# Patient Record
Sex: Female | Born: 1940
Health system: Southern US, Community
[De-identification: ages and names within clinical notes are randomized; demographics above are authoritative.]

## PROBLEM LIST (undated history)

## (undated) DIAGNOSIS — S82842A Displaced bimalleolar fracture of left lower leg, initial encounter for closed fracture: Secondary | ICD-10-CM

## (undated) DIAGNOSIS — C801 Malignant (primary) neoplasm, unspecified: Secondary | ICD-10-CM

## (undated) DIAGNOSIS — K222 Esophageal obstruction: Secondary | ICD-10-CM

## (undated) DIAGNOSIS — H409 Unspecified glaucoma: Secondary | ICD-10-CM

## (undated) DIAGNOSIS — Z8739 Personal history of other diseases of the musculoskeletal system and connective tissue: Secondary | ICD-10-CM

## (undated) DIAGNOSIS — D649 Anemia, unspecified: Secondary | ICD-10-CM

## (undated) DIAGNOSIS — M1990 Unspecified osteoarthritis, unspecified site: Secondary | ICD-10-CM

## (undated) DIAGNOSIS — E079 Disorder of thyroid, unspecified: Secondary | ICD-10-CM

## (undated) DIAGNOSIS — F32A Depression, unspecified: Secondary | ICD-10-CM

## (undated) DIAGNOSIS — F419 Anxiety disorder, unspecified: Secondary | ICD-10-CM

## (undated) DIAGNOSIS — K219 Gastro-esophageal reflux disease without esophagitis: Secondary | ICD-10-CM

## (undated) DIAGNOSIS — H269 Unspecified cataract: Secondary | ICD-10-CM

## (undated) DIAGNOSIS — C55 Malignant neoplasm of uterus, part unspecified: Secondary | ICD-10-CM

## (undated) DIAGNOSIS — Z8542 Personal history of malignant neoplasm of other parts of uterus: Secondary | ICD-10-CM

## (undated) DIAGNOSIS — E785 Hyperlipidemia, unspecified: Secondary | ICD-10-CM

## (undated) DIAGNOSIS — F329 Major depressive disorder, single episode, unspecified: Secondary | ICD-10-CM

## (undated) HISTORY — DX: Unspecified glaucoma: H40.9

## (undated) HISTORY — DX: Anemia, unspecified: D64.9

## (undated) HISTORY — DX: Unspecified cataract: H26.9

## (undated) HISTORY — DX: Malignant (primary) neoplasm, unspecified: C80.1

## (undated) HISTORY — DX: Esophageal obstruction: K22.2

## (undated) HISTORY — DX: Hyperlipidemia, unspecified: E78.5

## (undated) HISTORY — DX: Personal history of other diseases of the musculoskeletal system and connective tissue: Z87.39

## (undated) HISTORY — DX: Gastro-esophageal reflux disease without esophagitis: K21.9

## (undated) HISTORY — DX: Depression, unspecified: F32.A

## (undated) HISTORY — DX: Major depressive disorder, single episode, unspecified: F32.9

## (undated) HISTORY — DX: Disorder of thyroid, unspecified: E07.9

## (undated) HISTORY — DX: Personal history of malignant neoplasm of other parts of uterus: Z85.42

## (undated) HISTORY — DX: Malignant neoplasm of uterus, part unspecified: C55

## (undated) HISTORY — DX: Unspecified osteoarthritis, unspecified site: M19.90

## (undated) HISTORY — DX: Anxiety disorder, unspecified: F41.9

---

## 1997-04-15 DIAGNOSIS — Z8542 Personal history of malignant neoplasm of other parts of uterus: Secondary | ICD-10-CM

## 1997-04-15 HISTORY — DX: Personal history of malignant neoplasm of other parts of uterus: Z85.42

## 1997-04-15 HISTORY — PX: TOTAL ABDOMINAL HYSTERECTOMY: SHX209

## 1998-01-30 ENCOUNTER — Other Ambulatory Visit: Admission: RE | Admit: 1998-01-30 | Discharge: 1998-01-30 | Payer: Self-pay | Admitting: *Deleted

## 1998-02-27 ENCOUNTER — Other Ambulatory Visit: Admission: RE | Admit: 1998-02-27 | Discharge: 1998-02-27 | Payer: Self-pay | Admitting: *Deleted

## 1998-03-13 ENCOUNTER — Encounter: Admission: RE | Admit: 1998-03-13 | Discharge: 1998-06-11 | Payer: Self-pay | Admitting: Radiation Oncology

## 1999-01-26 ENCOUNTER — Other Ambulatory Visit: Admission: RE | Admit: 1999-01-26 | Discharge: 1999-01-26 | Payer: Self-pay | Admitting: *Deleted

## 1999-09-03 ENCOUNTER — Encounter: Payer: Self-pay | Admitting: Family Medicine

## 1999-09-03 ENCOUNTER — Encounter: Admission: RE | Admit: 1999-09-03 | Discharge: 1999-09-03 | Payer: Self-pay | Admitting: Family Medicine

## 1999-09-07 ENCOUNTER — Ambulatory Visit (HOSPITAL_COMMUNITY): Admission: RE | Admit: 1999-09-07 | Discharge: 1999-09-07 | Payer: Self-pay | Admitting: Ophthalmology

## 2000-06-24 ENCOUNTER — Encounter: Payer: Self-pay | Admitting: Oncology

## 2000-06-24 ENCOUNTER — Encounter: Admission: RE | Admit: 2000-06-24 | Discharge: 2000-06-24 | Payer: Self-pay | Admitting: Oncology

## 2000-10-22 ENCOUNTER — Other Ambulatory Visit: Admission: RE | Admit: 2000-10-22 | Discharge: 2000-10-22 | Payer: Self-pay | Admitting: Family Medicine

## 2000-11-19 ENCOUNTER — Encounter: Admission: RE | Admit: 2000-11-19 | Discharge: 2000-11-19 | Payer: Self-pay | Admitting: Family Medicine

## 2000-11-19 ENCOUNTER — Encounter: Payer: Self-pay | Admitting: Family Medicine

## 2001-07-20 ENCOUNTER — Encounter: Admission: RE | Admit: 2001-07-20 | Discharge: 2001-07-20 | Payer: Self-pay | Admitting: Oncology

## 2001-07-20 ENCOUNTER — Encounter: Payer: Self-pay | Admitting: Oncology

## 2002-07-27 ENCOUNTER — Encounter: Admission: RE | Admit: 2002-07-27 | Discharge: 2002-07-27 | Payer: Self-pay | Admitting: Oncology

## 2002-07-27 ENCOUNTER — Encounter: Payer: Self-pay | Admitting: Oncology

## 2003-05-17 HISTORY — PX: COLONOSCOPY: SHX174

## 2003-12-12 ENCOUNTER — Encounter: Admission: RE | Admit: 2003-12-12 | Discharge: 2003-12-12 | Payer: Self-pay | Admitting: Family Medicine

## 2005-02-28 ENCOUNTER — Encounter: Admission: RE | Admit: 2005-02-28 | Discharge: 2005-02-28 | Payer: Self-pay | Admitting: Family Medicine

## 2005-05-10 ENCOUNTER — Ambulatory Visit: Payer: Self-pay | Admitting: Family Medicine

## 2005-05-21 ENCOUNTER — Ambulatory Visit: Payer: Self-pay | Admitting: Family Medicine

## 2005-05-21 ENCOUNTER — Other Ambulatory Visit: Admission: RE | Admit: 2005-05-21 | Discharge: 2005-05-21 | Payer: Self-pay | Admitting: Family Medicine

## 2005-05-21 ENCOUNTER — Encounter: Payer: Self-pay | Admitting: Family Medicine

## 2005-05-24 ENCOUNTER — Encounter: Payer: Self-pay | Admitting: Family Medicine

## 2006-05-23 ENCOUNTER — Ambulatory Visit: Payer: Self-pay | Admitting: Family Medicine

## 2006-05-23 LAB — CONVERTED CEMR LAB
ALT: 18 units/L (ref 0–40)
AST: 24 units/L (ref 0–37)
Albumin: 3.5 g/dL (ref 3.5–5.2)
Alkaline Phosphatase: 59 units/L (ref 39–117)
Cholesterol: 230 mg/dL (ref 0–200)
Direct LDL: 136.7 mg/dL
Eosinophils Absolute: 0.1 10*3/uL (ref 0.0–0.6)
Eosinophils Relative: 2.4 % (ref 0.0–5.0)
GFR calc Af Amer: 93 mL/min
GFR calc non Af Amer: 77 mL/min
Glucose, Bld: 108 mg/dL — ABNORMAL HIGH (ref 70–99)
HCT: 36.9 % (ref 36.0–46.0)
HDL: 74.8 mg/dL (ref >39.0)
Hemoglobin: 12.9 g/dL (ref 12.0–15.0)
Hgb A1c MFr Bld: 5.8 % (ref 4.6–6.0)
Lymphocytes Relative: 27.2 % (ref 12.0–46.0)
Monocytes Relative: 11.9 % — ABNORMAL HIGH (ref 3.0–11.0)
Neutrophils Relative %: 58.2 % (ref 43.0–77.0)
Platelets: 257 10*3/uL (ref 150–400)
Potassium: 5.1 meq/L (ref 3.5–5.1)
Total Bilirubin: 0.7 mg/dL (ref 0.3–1.2)
Total CHOL/HDL Ratio: 3.1
VLDL: 27 mg/dL (ref 0–40)

## 2006-06-17 ENCOUNTER — Ambulatory Visit (HOSPITAL_COMMUNITY): Admission: RE | Admit: 2006-06-17 | Discharge: 2006-06-17 | Payer: Self-pay | Admitting: Ophthalmology

## 2006-06-24 ENCOUNTER — Ambulatory Visit: Payer: Self-pay | Admitting: Family Medicine

## 2006-09-02 ENCOUNTER — Encounter: Admission: RE | Admit: 2006-09-02 | Discharge: 2006-09-02 | Payer: Self-pay | Admitting: Family Medicine

## 2007-06-03 ENCOUNTER — Ambulatory Visit: Payer: Self-pay | Admitting: Family Medicine

## 2007-06-03 DIAGNOSIS — K222 Esophageal obstruction: Secondary | ICD-10-CM

## 2007-06-03 DIAGNOSIS — E785 Hyperlipidemia, unspecified: Secondary | ICD-10-CM

## 2007-06-03 DIAGNOSIS — K219 Gastro-esophageal reflux disease without esophagitis: Secondary | ICD-10-CM

## 2007-06-03 DIAGNOSIS — F332 Major depressive disorder, recurrent severe without psychotic features: Secondary | ICD-10-CM

## 2007-06-03 HISTORY — DX: Gastro-esophageal reflux disease without esophagitis: K21.9

## 2007-06-04 ENCOUNTER — Ambulatory Visit: Payer: Self-pay | Admitting: Family Medicine

## 2007-06-09 ENCOUNTER — Ambulatory Visit: Payer: Self-pay | Admitting: Family Medicine

## 2007-06-18 ENCOUNTER — Telehealth: Payer: Self-pay | Admitting: Family Medicine

## 2007-07-15 ENCOUNTER — Encounter: Payer: Self-pay | Admitting: Family Medicine

## 2007-07-15 ENCOUNTER — Ambulatory Visit: Payer: Self-pay | Admitting: Family Medicine

## 2007-07-15 ENCOUNTER — Other Ambulatory Visit: Admission: RE | Admit: 2007-07-15 | Discharge: 2007-07-15 | Payer: Self-pay | Admitting: Family Medicine

## 2007-07-15 DIAGNOSIS — M722 Plantar fascial fibromatosis: Secondary | ICD-10-CM

## 2007-07-15 LAB — CONVERTED CEMR LAB
Blood in Urine, dipstick: NEGATIVE
Glucose, Urine, Semiquant: NEGATIVE
Ketones, urine, test strip: NEGATIVE
pH: 7

## 2007-07-23 ENCOUNTER — Encounter: Payer: Self-pay | Admitting: Family Medicine

## 2007-07-27 LAB — CONVERTED CEMR LAB
ALT: 20 units/L (ref 0–35)
AST: 26 units/L (ref 0–37)
Albumin: 3.6 g/dL (ref 3.5–5.2)
BUN: 17 mg/dL (ref 6–23)
Bilirubin, Direct: 0.1 mg/dL (ref 0.0–0.3)
Calcium: 9.1 mg/dL (ref 8.4–10.5)
Chloride: 106 meq/L (ref 96–112)
Direct LDL: 164.8 mg/dL
Eosinophils Relative: 2.6 % (ref 0.0–5.0)
GFR calc Af Amer: 72 mL/min
GFR calc non Af Amer: 59 mL/min
Glucose, Bld: 103 mg/dL — ABNORMAL HIGH (ref 70–99)
Hemoglobin: 12.7 g/dL (ref 12.0–15.0)
Lymphocytes Relative: 28.8 % (ref 12.0–46.0)
MCV: 98.4 fL (ref 78.0–100.0)
Monocytes Absolute: 0.4 10*3/uL (ref 0.1–1.0)
Neutro Abs: 2.8 10*3/uL (ref 1.4–7.7)
Neutrophils Relative %: 59.5 % (ref 43.0–77.0)
Potassium: 4.7 meq/L (ref 3.5–5.1)
RBC: 3.88 M/uL (ref 3.87–5.11)
Total Bilirubin: 0.6 mg/dL (ref 0.3–1.2)
Total CHOL/HDL Ratio: 3.3
VLDL: 27 mg/dL (ref 0–40)

## 2007-09-11 ENCOUNTER — Ambulatory Visit: Payer: Self-pay | Admitting: Family Medicine

## 2007-09-21 ENCOUNTER — Ambulatory Visit: Payer: Self-pay | Admitting: Family Medicine

## 2007-09-30 ENCOUNTER — Ambulatory Visit: Payer: Self-pay | Admitting: Family Medicine

## 2007-09-30 DIAGNOSIS — R5383 Other fatigue: Secondary | ICD-10-CM

## 2007-09-30 DIAGNOSIS — M8580 Other specified disorders of bone density and structure, unspecified site: Secondary | ICD-10-CM

## 2007-09-30 DIAGNOSIS — E039 Hypothyroidism, unspecified: Secondary | ICD-10-CM | POA: Insufficient documentation

## 2007-09-30 DIAGNOSIS — R5381 Other malaise: Secondary | ICD-10-CM | POA: Insufficient documentation

## 2007-10-02 LAB — CONVERTED CEMR LAB
Cholesterol: 236 mg/dL (ref 0–200)
HDL: 79.6 mg/dL (ref >39.0)
T3 Uptake Ratio: 32.3 % (ref 22.5–37.0)
T4, Total: 6.1 ug/dL (ref 5.0–12.5)
Total CHOL/HDL Ratio: 3
Vit D, 1,25-Dihydroxy: 27 — ABNORMAL LOW (ref 30–89)

## 2007-11-23 ENCOUNTER — Encounter: Admission: RE | Admit: 2007-11-23 | Discharge: 2007-11-23 | Payer: Self-pay | Admitting: Family Medicine

## 2007-12-30 ENCOUNTER — Encounter: Payer: Self-pay | Admitting: Family Medicine

## 2008-03-25 ENCOUNTER — Telehealth: Payer: Self-pay | Admitting: Family Medicine

## 2008-04-15 HISTORY — PX: OTHER SURGICAL HISTORY: SHX169

## 2008-04-20 ENCOUNTER — Telehealth: Payer: Self-pay | Admitting: Family Medicine

## 2008-05-10 ENCOUNTER — Ambulatory Visit: Payer: Self-pay | Admitting: Family Medicine

## 2008-05-31 ENCOUNTER — Ambulatory Visit: Payer: Self-pay | Admitting: Family Medicine

## 2008-05-31 DIAGNOSIS — G47419 Narcolepsy without cataplexy: Secondary | ICD-10-CM | POA: Insufficient documentation

## 2008-06-15 ENCOUNTER — Telehealth: Payer: Self-pay | Admitting: Family Medicine

## 2008-06-20 ENCOUNTER — Telehealth: Payer: Self-pay | Admitting: Family Medicine

## 2008-07-06 ENCOUNTER — Telehealth: Payer: Self-pay | Admitting: Family Medicine

## 2008-07-07 ENCOUNTER — Ambulatory Visit: Payer: Self-pay | Admitting: Family Medicine

## 2008-10-07 ENCOUNTER — Telehealth: Payer: Self-pay | Admitting: Internal Medicine

## 2008-11-03 ENCOUNTER — Telehealth (INDEPENDENT_AMBULATORY_CARE_PROVIDER_SITE_OTHER): Payer: Self-pay | Admitting: *Deleted

## 2009-01-05 ENCOUNTER — Encounter: Payer: Self-pay | Admitting: Family Medicine

## 2009-06-07 ENCOUNTER — Ambulatory Visit: Payer: Self-pay | Admitting: Family Medicine

## 2009-06-07 ENCOUNTER — Other Ambulatory Visit: Admission: RE | Admit: 2009-06-07 | Discharge: 2009-06-07 | Payer: Self-pay | Admitting: Family Medicine

## 2009-06-07 DIAGNOSIS — M1990 Unspecified osteoarthritis, unspecified site: Secondary | ICD-10-CM | POA: Insufficient documentation

## 2009-06-07 DIAGNOSIS — T50995A Adverse effect of other drugs, medicaments and biological substances, initial encounter: Secondary | ICD-10-CM

## 2009-06-07 DIAGNOSIS — D649 Anemia, unspecified: Secondary | ICD-10-CM | POA: Insufficient documentation

## 2009-06-12 ENCOUNTER — Telehealth: Payer: Self-pay | Admitting: Family Medicine

## 2009-06-21 ENCOUNTER — Telehealth (INDEPENDENT_AMBULATORY_CARE_PROVIDER_SITE_OTHER): Payer: Self-pay | Admitting: *Deleted

## 2009-07-27 ENCOUNTER — Telehealth: Payer: Self-pay | Admitting: Family Medicine

## 2009-08-01 ENCOUNTER — Telehealth: Payer: Self-pay | Admitting: Family Medicine

## 2009-11-30 ENCOUNTER — Telehealth: Payer: Self-pay | Admitting: Family Medicine

## 2010-01-26 ENCOUNTER — Telehealth: Payer: Self-pay | Admitting: Family Medicine

## 2010-03-15 ENCOUNTER — Encounter: Payer: Self-pay | Admitting: Family Medicine

## 2010-05-13 LAB — CONVERTED CEMR LAB
ALT: 25 units/L (ref 0–35)
Alkaline Phosphatase: 68 units/L (ref 39–117)
BUN: 19 mg/dL (ref 6–23)
Basophils Absolute: 0 10*3/uL (ref 0.0–0.1)
Basophils Relative: 0.8 % (ref 0.0–3.0)
Bilirubin, Direct: 0.1 mg/dL (ref 0.0–0.3)
Calcium: 9.1 mg/dL (ref 8.4–10.5)
Chloride: 106 meq/L (ref 96–112)
Cholesterol: 263 mg/dL — ABNORMAL HIGH (ref 0–200)
Eosinophils Absolute: 0.1 10*3/uL (ref 0.0–0.7)
HCT: 39.6 % (ref 36.0–46.0)
HDL: 93.4 mg/dL (ref >39.00)
Hemoglobin: 13.3 g/dL (ref 12.0–15.0)
Ketones, urine, test strip: NEGATIVE
Neutro Abs: 2.7 10*3/uL (ref 1.4–7.7)
Potassium: 4.5 meq/L (ref 3.5–5.1)
RDW: 12.6 % (ref 11.5–14.6)
Specific Gravity, Urine: 1.025
Total Bilirubin: 0.3 mg/dL (ref 0.3–1.2)
Total CHOL/HDL Ratio: 3
VLDL: 27 mg/dL (ref 0.0–40.0)
WBC Urine, dipstick: NEGATIVE
WBC: 4.6 10*3/uL (ref 4.5–10.5)

## 2010-05-15 NOTE — Progress Notes (Signed)
Summary: [facility]  Phone Note From Pharmacy   Caller: [facility] Call For: nurse  Summary of Call: Vit D called in for 1 once daily and doesn't match quantity.  Initial call taken by: Raechel Ache, RN,  June 21, 2009 10:45 AM  Follow-up for Phone Call        changed to 1 q month per Dr Charmian Muff note. Follow-up by: Raechel Ache, RN,  June 21, 2009 10:45 AM    New/Updated Medications: VITAMIN D (ERGOCALCIFEROL) 50000 UNIT CAPS (ERGOCALCIFEROL) 1 q month

## 2010-05-15 NOTE — Assessment & Plan Note (Signed)
Summary: emp/pt fasting/cjr   Vital Signs:  Patient profile:   70 year old female Height:      64.5 inches Weight:      188 pounds BMI:     31.89 O2 Sat:      98 % Temp:     97.7 degrees F Pulse rate:   70 / minute BP sitting:   116 / 80  (left arm)  Vitals Entered By: Pura Spice, RN (June 07, 2009 10:48 AM) CC: fasting for labs wants to discuss problems needs pap  Is Patient Diabetic? No Pain Assessment Patient in pain? no        History of Present Illness: This 70 year old white married female is in to discuss her medical problems as well as refill her medications. She is fasting Ted relates that over the past month he has had excruciating pain in both the base of her toes in fact over the past 3-4 months. She also has had arthritic pain of her fingers and back. Patient relates that over the past 4-6 weeks she been very depressed and in fact had stopped her Prozac which she taken previously Patient will schedule mammogram bone density and: Exam Past history of having had shingles x2 and does not need zostavax EKG reveals no acute problem  Allergies: 1)  ! Cipro (Ciprofloxacin Hcl)  Past History:  Past Surgical History: Hysterectomy bilateral cataracgt  removal  Family History: hysterectomy Bilateral catarract removal  Review of Systems      See HPI General:  Complains of fatigue. Eyes:  Denies blurring, discharge, double vision, eye irritation, eye pain, halos, itching, light sensitivity, red eye, vision loss-1 eye, and vision loss-both eyes. ENT:  Denies decreased hearing, difficulty swallowing, ear discharge, earache, hoarseness, nasal congestion, nosebleeds, postnasal drainage, ringing in ears, sinus pressure, and sore throat. CV:  Denies bluish discoloration of lips or nails, chest pain or discomfort, difficulty breathing at night, difficulty breathing while lying down, fainting, fatigue, leg cramps with exertion, lightheadness, near fainting,  palpitations, shortness of breath with exertion, swelling of feet, swelling of hands, and weight gain; blood pressure is well controlled. Resp:  Denies chest discomfort, chest pain with inspiration, cough, coughing up blood, excessive snoring, hypersomnolence, morning headaches, pleuritic, shortness of breath, sputum productive, and wheezing. GI:  Denies abdominal pain, bloody stools, change in bowel habits, constipation, dark tarry stools, diarrhea, excessive appetite, gas, hemorrhoids, indigestion, loss of appetite, nausea, vomiting, vomiting blood, and yellowish skin color. GU:  Denies abnormal vaginal bleeding, decreased libido, discharge, dysuria, genital sores, hematuria, incontinence, nocturia, urinary frequency, and urinary hesitancy. MS:  Complains of joint pain; severe foot pain bilaterally. Derm:  See HPI; Denies changes in color of skin, changes in nail beds, dryness, excessive perspiration, flushing, hair loss, insect bite(s), itching, lesion(s), poor wound healing, and rash. Neuro:  Denies brief paralysis, difficulty with concentration, disturbances in coordination, falling down, headaches, inability to speak, memory loss, numbness, poor balance, seizures, sensation of room spinning, tingling, tremors, visual disturbances, and weakness. Psych:  Complains of anxiety and depression.  Physical Exam  General:  Well-developed,well-nourished,in no acute distress; alert,appropriate and cooperative throughout examinationoverweight-appearing.   Head:  Normocephalic and atraumatic without obvious abnormalities. No apparent alopecia or balding. Eyes:  No corneal or conjunctival inflammation noted. EOMI. Perrla. Funduscopic exam benign, without hemorrhages, exudates or papilledema. Vision grossly normal. Ears:  External ear exam shows no significant lesions or deformities.  Otoscopic examination reveals clear canals, tympanic membranes are intact bilaterally without bulging, retraction, inflammation  or discharge. Hearing is grossly normal bilaterally. Nose:  External nasal examination shows no deformity or inflammation. Nasal mucosa are pink and moist without lesions or exudates. Mouth:  Oral mucosa and oropharynx without lesions or exudates.  Teeth in good repair. Neck:  No deformities, masses, or tenderness noted. Chest Wall:  No deformities, masses, or tenderness noted. Breasts:  No mass, nodules, thickening, tenderness, bulging, retraction, inflamation, nipple discharge or skin changes noted.   Lungs:  Normal respiratory effort, chest expands symmetrically. Lungs are clear to auscultation, no crackles or wheezes. Heart:  Normal rate and regular rhythm. S1 and S2 normal without gallop, murmur, click, rub or other extra sounds. Abdomen:  Bowel sounds positive,abdomen soft and non-tender without masses, organomegaly or hernias noted. Rectal:  No external abnormalities noted. Normal sphincter tone. No rectal masses or tenderness. Genitalia:  absent uterus no other abnormalities Msk:  No deformity or scoliosis noted of thoracic or lumbar spine.   Pulses:  R and L carotid,radial,femoral,dorsalis pedis and posterior tibial pulses are full and equal bilaterally Extremities:  No clubbing, cyanosis, edema, or deformity noted with normal full range of motion of all joints.   Neurologic:  No cranial nerve deficits noted. Station and gait are normal. Plantar reflexes are down-going bilaterally. DTRs are symmetrical throughout. Sensory, motor and coordinative functions appear intact. Skin:  Intact without suspicious lesions or rashes Cervical Nodes:  No lymphadenopathy noted Axillary Nodes:  No palpable lymphadenopathy Inguinal Nodes:  No significant adenopathy Psych:  Cognition and judgment appear intact. Alert and cooperative with normal attention span and concentration. No apparent delusions, illusions, hallucinations   Impression & Recommendations:  Problem # 1:  NARCOLEPSY WITHOUT CATAPLEXY  (ICD-347.00) Assessment Unchanged  Problem # 2:  OTHER MALAISE AND FATIGUE (ICD-780.79) Assessment: Deteriorated n uuvigil 250 mg q am  Problem # 3:  OSTEOPENIA (ICD-733.90) Assessment: Unchanged  Her updated medication list for this problem includes:    Vitamin D (ergocalciferol) 50000 Unit Caps (Ergocalciferol) .Marland Kitchen... 1 qd  Orders: T-Vitamin D (25-Hydroxy) ([address])  Problem # 4:  PLANTAR FASCIITIS (ICD-728.71) Assessment: Improved  Her updated medication list for this problem includes:    Diclofenac Sodium 75 Mg Tbec (Diclofenac sodium) .Marland Kitchen... 1 bid  Problem # 5:  ANXIETY DEPRESSION (ICD-300.4) Assessment: Deteriorated  Problem # 6:  GERD (ICD-530.81) Assessment: Improved  Problem # 7:  ARTHRITIS (ICD-716.90) Assessment: Deteriorated  Complete Medication List: 1)  Centrum Chew (Multiple vitamins-minerals) .... Take 2)  Fish Oil 500 Mg Caps (Omega-3 fatty acids) .... Take 3)  Tgt Aspirin 81 Mg Tbec (Aspirin) .... Take 1 tablet by mouth every morning 4)  Xanax 0.25 Mg Tabs (Alprazolam) .... Take 1 by mouth once daily as needed anxiety. 5)  Wellbutrin Xl 300 Mg Tb24 (Bupropion hcl) .... Two times a day 6)  Estradiol 1 Mg Tabs (Estradiol) .... Once daily 7)  Hydrocodone-acetaminophen 10-650 Mg Tabs (Hydrocodone-acetaminophen) .... Take 1 every 4-6 hrs as needed pan.not to exceed 4 per day 8)  Diclofenac Sodium 75 Mg Tbec (Diclofenac sodium) .Marland Kitchen.. 1 bid 9)  Lexapro 10 Mg Tabs (Escitalopram oxalate) .Marland Kitchen.. 1 once daily for anxiety depression 10)  Nuvigil 250 Mg Tabs (Armodafinil) .Marland Kitchen.. 1 qd 11)  Nuvigil 250 Mg Tabs (Armodafinil) .Marland Kitchen.. 1 once daily 12)  Vitamin D (ergocalciferol) 50000 Unit Caps (Ergocalciferol) .Marland Kitchen.. 1 qd  Other Orders: Pneumococcal Vaccine (09811) Admin 1st Vaccine (91478) UA Dipstick w/o Micro (automated)  (81003) EKG w/ Interpretation (93000) Venipuncture (29562) TLB-Lipid Panel (80061-LIPID) TLB-BMP (Basic Metabolic Panel-BMET)  (80048-METABOL)  TLB-CBC Platelet - w/Differential (85025-CBCD) TLB-Hepatic/Liver Function Pnl (80076-HEPATIC) TLB-TSH (Thyroid Stimulating Hormone) (84443-TSH)  Patient Instructions: 1)  restart diclofenac for your inflammatory problems as the arthritis and residual plantar fasciitis 2)  For your increased depression we'll start Lexapro 10 mg each day 3)  Continue your other medications 4)  We'll call you the results of your lab studies Prescriptions: ESTRADIOL 1 MG  TABS (ESTRADIOL) once daily  #90 x 3   Entered and Authorized by:   Judithann Sheen MD   Signed by:   Judithann Sheen MD on 06/13/2009   Method used:   Faxed to ...       [facility]* (mail-order)             ,          Ph: [phone]       Fax: [phone]   RxID:   [id number] VITAMIN D (ERGOCALCIFEROL) 50000 UNIT CAPS (ERGOCALCIFEROL) 1 qd  #12 x 1   Entered and Authorized by:   Judithann Sheen MD   Signed by:   Judithann Sheen MD on 06/13/2009   Method used:   Faxed to ...       [facility]* (mail-order)             ,          Ph: [phone]       Fax: [phone]   RxID:   [id number] NUVIGIL 250 MG TABS (ARMODAFINIL) 1 qd  #90 x 1   Entered and Authorized by:   Judithann Sheen MD   Signed by:   Judithann Sheen MD on 06/07/2009   Method used:   Printed then faxed to ...       [facility] #[id number]* (retail)       [address] [place], [place]  [address]       Ph: [phone]       Fax: [phone]   RxID:   [id number] NUVIGIL 250 MG TABS (ARMODAFINIL) 1 once daily  #7 x 11   Entered and Authorized by:   Judithann Sheen MD   Signed by:   Judithann Sheen MD on 06/07/2009   Method used:   Print then Give to Patient   RxID:   [id number] NUVIGIL 250 MG TABS (ARMODAFINIL) 1 qd  #90 x 1   Entered and Authorized by:   Judithann Sheen MD   Signed by:   Judithann Sheen MD on 06/07/2009   Method used:   Printed then  faxed to ...       [facility] #[id number]* (retail)       [address] [place], [place]  [address]       Ph: [phone]       Fax: [phone]   RxID:   [id number] XANAX 0.25 MG TABS (ALPRAZOLAM) take 1 by mouth once daily as needed anxiety.  #901 x 1   Entered and Authorized by:   Judithann Sheen MD   Signed by:   Judithann Sheen MD on 06/07/2009   Method used:   Printed then faxed to ...       [facility] #[id number]* (retail)       [address]       [place], [place]  [address]       Ph: [phone]       Fax: [phone]   RxID:   [id number] HYDROCODONE-ACETAMINOPHEN 10-650 MG  TABS (HYDROCODONE-ACETAMINOPHEN) take 1 every 4-6 hrs as needed pan.not to exceed 4 per day  #360 x 1   Entered and Authorized by:   Judithann Sheen MD   Signed by:   Judithann Sheen MD on 06/07/2009   Method used:   Printed then faxed to ...       [facility] #[id number]* (retail)       [address] [place], [place]  [address]       Ph: [phone]       Fax: [phone]   RxID:   [id number] DICLOFENAC SODIUM 75 MG  TBEC (DICLOFENAC SODIUM) 1 bid  #180 x 3   Entered and Authorized by:   Judithann Sheen MD   Signed by:   Judithann Sheen MD on 06/07/2009   Method used:   Faxed to ...       [facility] #[id number]* (retail)       [address] [place], [place]  [address]       Ph: [phone]       Fax: [phone]   RxID:   [id number] ESTRADIOL 1 MG  TABS (ESTRADIOL) once daily  #90 x 3   Entered and Authorized by:   Judithann Sheen MD   Signed by:   Judithann Sheen MD on 06/07/2009   Method used:   Faxed to ...       [facility] #[id number]* (retail)       [address] [place], [place]  [address]       Ph: [phone]       Fax: [phone]   RxID:   [id number] WELLBUTRIN XL 300 MG  TB24 (BUPROPION HCL) two times a day  #180 x 3   Entered and Authorized by:   Judithann Sheen MD   Signed by:   Judithann Sheen MD on 06/07/2009   Method used:   Faxed to ...       [facility] #[id number]* (retail)       [address] [place], [place]  [address]       Ph: [phone]       Fax: [phone]   RxID:   [id number] XANAX 0.25 MG TABS (ALPRAZOLAM) take 1 by mouth once daily as needed anxiety.  #90 x 1   Entered and Authorized by:   Judithann Sheen MD   Signed by:   Judithann Sheen MD on 06/07/2009   Method used:   Printed then faxed to ...       [facility] #[id number]* (retail)       [address] [place], [place]  [address]       Ph: [phone]       Fax: [phone]   RxID:   [id number]    Immunizations Administered:  Pneumonia Vaccine:    Vaccine Type: Pneumovax    Site: left deltoid    Mfr: Merck    Dose: 0.5 ml    Route: IM    Given by: Pura Spice,  RN    Exp. Date: 08/03/2010    Lot #: [id number]    Laboratory Results   Urine Tests    Routine Urinalysis   Color: yellow Appearance: Clear Glucose: negative   (Normal Range: Negative) Bilirubin: negative   (Normal Range: Negative) Ketone: negative   (Normal Range: Negative) Spec. Gravity: 1.025   (Normal Range: 1.003-1.035) Blood: negative   (Normal Range: Negative) pH: 5.0   (Normal Range: 5.0-8.0) Protein: negative   (Normal Range: Negative) Urobilinogen: 0.2   (Normal Range: 0-1) Nitrite: negative   (Normal Range: Negative) Leukocyte Esterace: negative   (Normal Range: Negative)    Comments: Rita Ohara  June 07, 2009 12:21 PM

## 2010-05-15 NOTE — Progress Notes (Signed)
  Phone Note From Pharmacy   Caller: [facility]  Reason for Call: Medication not on formulary Summary of Call: lexapro not covered needs altern med  Initial call taken by: Pura Spice, RN,  August 01, 2009 9:31 AM  Follow-up for Phone Call        per dr Alfonzo Feller rx for citalopram  20 mg  Follow-up by: Pura Spice, RN,  August 01, 2009 9:32 AM    New/Updated Medications: CITALOPRAM HYDROBROMIDE 20 MG TABS (CITALOPRAM HYDROBROMIDE) 1 by mouth once daily Prescriptions: CITALOPRAM HYDROBROMIDE 20 MG TABS (CITALOPRAM HYDROBROMIDE) 1 by mouth once daily  #90 x 3   Entered by:   Pura Spice, RN   Authorized by:   Judithann Sheen MD   Signed by:   Pura Spice, RN on 08/01/2009   Method used:   Faxed to ...       [facility]* (mail-order)             ,          Ph: [phone]       Fax: [phone]   RxID:   [id number]

## 2010-05-15 NOTE — Medication Information (Signed)
Summary: Prior Authorization and Approval for Nuvigil  Prior Authorization and Approval for Nuvigil   Imported By: Maryln Gottron 03/21/2010 15:54:42  _____________________________________________________________________  External Attachment:    Type:   Image     Comment:   External Document

## 2010-05-15 NOTE — Progress Notes (Signed)
Summary: Pt called and needs to get Nuvigil renewed via [employer] [facility]  Phone Note Refill Request Call back at [phone] home or cell [phone] Message from:  Patient on November 30, 2009 12:50 PM  Refills Requested: Medication #1:  NUVIGIL 250 MG TABS 1 once daily   Dosage confirmed as above?Dosage Confirmed Pt called and that she rcvd a notice from [facility] that this script is going to expire soon and will need to be renew. In the letter pt rcvd, [facility] stated, that pt needs to call her PCP and give them case # [id number] and have pcp call [facility] at [phone] to get med renewed.     Method Requested: Telephone to [facility] [facility] Initial call taken by: Lucy Antigua,  November 30, 2009 12:49 PM  Follow-up for Phone Call        ok per dr Scotty Court faxed rx to [facility].  Follow-up by: Pura Spice, RN,  November 30, 2009 3:09 PM    New/Updated Medications: NUVIGIL 250 MG TABS (ARMODAFINIL) 1 by mouth once daily Prescriptions: NUVIGIL 250 MG TABS (ARMODAFINIL) 1 by mouth once daily  #90 x 1   Entered by:   Pura Spice, RN   Authorized by:   Judithann Sheen MD   Signed by:   Pura Spice, RN on 11/30/2009   Method used:   Printed then faxed to ...       [facility] (mail-order)             , [place]         Ph: [phone]       Fax: [phone]   RxID:   [id number]

## 2010-05-15 NOTE — Progress Notes (Signed)
Summary: Pt req script for higher dose of Lexapro. Samples work Electrical engineer Note Call from Patient Call back at [employer] [phone]   Caller: Patient Summary of Call: Pt called and said that the samples of Lexapro 10mg  work great. Pt is req a script for higher dosage, if possible.  Please send to [facility].     Initial call taken by: Lucy Antigua,  July 27, 2009 11:24 AM  Follow-up for Phone Call        ok done  Follow-up by: Pura Spice, RN,  July 27, 2009 1:38 PM    New/Updated Medications: LEXAPRO 20 MG TABS (ESCITALOPRAM OXALATE) 1 by mouth once daily Prescriptions: LEXAPRO 20 MG TABS (ESCITALOPRAM OXALATE) 1 by mouth once daily  #90 x 3   Entered by:   Pura Spice, RN   Authorized by:   Judithann Sheen MD   Signed by:   Pura Spice, RN on 07/27/2009   Method used:   Faxed to ...       [facility] (mail-order)             ,          Ph: [phone]       Fax: [phone]   RxID:   [id number]

## 2010-05-15 NOTE — Progress Notes (Signed)
Summary: REFILL REQUEST  Phone Note Refill Request Message from:  Patient on January 26, 2010 2:13 PM  Refills Requested: Medication #1:  XANAX 0.25 MG TABS take 1 by mouth once daily as needed anxiety.   Notes: [facility]    Initial call taken by: Debbra Riding,  January 26, 2010 2:13 PM  Follow-up for Phone Call        OK to give refill with same sig, #90 but no rf Follow-up by: Danise Edge MD,  January 26, 2010 3:07 PM    Prescriptions: Prudy Feeler 0.25 MG TABS (ALPRAZOLAM) take 1 by mouth once daily as needed anxiety.  #90 x 0   Entered by:   Josph Macho RMA   Authorized by:   Danise Edge MD   Signed by:   Josph Macho RMA on 01/26/2010   Method used:   Telephoned to ...       [facility]* (retail)             ,          Ph: [phone]       Fax: [phone]   RxID:   [id number] XANAX 0.25 MG TABS (ALPRAZOLAM) take 1 by mouth once daily as needed anxiety.  #90 x 0   Entered by:   Josph Macho RMA   Authorized by:   Danise Edge MD   Signed by:   Josph Macho RMA on 01/26/2010   Method used:   Telephoned to ...       [id number]* (retail)       [address] [place], [place]  [address]       Ph: [phone]       Fax: [phone]   RxID:   [id number]  Left RX with Asher Muir at [facility]/ Called and cancelled the RX called into [facility]/ [facility]

## 2010-05-15 NOTE — Progress Notes (Signed)
Summary: REFILL  estradiol   Phone Note Refill Request Message from:  Fax from Pharmacy  Refills Requested: Medication #1:  ESTRADIOL 1 MG  TABS once daily [facility]--- [phone]  Initial call taken by: Warnell Forester,  June 12, 2009 8:29 AM     Appended Document: REFILL  estradiol  sent to [facility] by dr Scotty Court ............gh rn   ................Marland Kitchen

## 2010-07-02 ENCOUNTER — Encounter: Payer: Self-pay | Admitting: Family Medicine

## 2010-07-04 ENCOUNTER — Encounter: Payer: Self-pay | Admitting: Internal Medicine

## 2010-07-12 NOTE — Letter (Signed)
Summary: Colonoscopy Date Change Letter  [facility]  [address]   [place], [place] [address]   Phone: [phone]  Fax: [phone]      July 04, 2010 MRN: [id number]   [facility] [address] [place], [place]  [address]   Dear Faith Santiago,   Previously you were recommended to have a repeat colonoscopy around this time. Your chart was recently reviewed by Dr.Albie Arizpe of [facility]. Follow up colonoscopy is now recommended in February 2015. This revised recommendation is based on current, nationally recognized guidelines for colorectal cancer screening and polyp surveillance. These guidelines are endorsed by the American Cancer Society, [employer] on Colorectal Cancer as well as numerous other major medical organizations.  Please understand that our recommendation assumes that you do not have any new symptoms such as bleeding, a change in bowel habits, anemia, or significant abdominal discomfort. If you do have any concerning GI symptoms or want to discuss the guideline recommendations, please call to arrange an office visit at your earliest convenience. Otherwise we will keep you in our reminder system and contact you 1-2 months prior to the date listed above to schedule your next colonoscopy.  Thank you,   Stan Head, M.D.  [facility] [phone]

## 2010-07-17 ENCOUNTER — Encounter: Payer: Self-pay | Admitting: Family Medicine

## 2010-07-17 ENCOUNTER — Ambulatory Visit (INDEPENDENT_AMBULATORY_CARE_PROVIDER_SITE_OTHER): Payer: Medicare Other | Admitting: Family Medicine

## 2010-07-17 VITALS — BP 110/80 | HR 85 | Temp 97.7°F | Ht 64.0 in | Wt 193.0 lb

## 2010-07-17 DIAGNOSIS — D649 Anemia, unspecified: Secondary | ICD-10-CM

## 2010-07-17 DIAGNOSIS — E785 Hyperlipidemia, unspecified: Secondary | ICD-10-CM

## 2010-07-17 DIAGNOSIS — N951 Menopausal and female climacteric states: Secondary | ICD-10-CM

## 2010-07-17 DIAGNOSIS — M129 Arthropathy, unspecified: Secondary | ICD-10-CM

## 2010-07-17 DIAGNOSIS — E559 Vitamin D deficiency, unspecified: Secondary | ICD-10-CM

## 2010-07-17 DIAGNOSIS — F341 Dysthymic disorder: Secondary | ICD-10-CM

## 2010-07-17 DIAGNOSIS — G47419 Narcolepsy without cataplexy: Secondary | ICD-10-CM

## 2010-07-17 DIAGNOSIS — E039 Hypothyroidism, unspecified: Secondary | ICD-10-CM

## 2010-07-17 DIAGNOSIS — R3915 Urgency of urination: Secondary | ICD-10-CM

## 2010-07-17 DIAGNOSIS — F419 Anxiety disorder, unspecified: Secondary | ICD-10-CM

## 2010-07-17 DIAGNOSIS — E6609 Other obesity due to excess calories: Secondary | ICD-10-CM

## 2010-07-17 DIAGNOSIS — M1990 Unspecified osteoarthritis, unspecified site: Secondary | ICD-10-CM

## 2010-07-17 LAB — CBC WITH DIFFERENTIAL/PLATELET
Eosinophils Absolute: 0.1 10*3/uL (ref 0.0–0.7)
HCT: 40.5 % (ref 36.0–46.0)
Lymphs Abs: 1.6 10*3/uL (ref 0.7–4.0)
MCHC: 34.6 g/dL (ref 30.0–36.0)
Monocytes Relative: 9.8 % (ref 3.0–12.0)
RBC: 3.99 Mil/uL (ref 3.87–5.11)

## 2010-07-18 LAB — TSH: TSH: 1.73 u[IU]/mL (ref 0.35–5.50)

## 2010-07-18 LAB — BASIC METABOLIC PANEL
BUN: 22 mg/dL (ref 6–23)
CO2: 25 mEq/L (ref 19–32)
Chloride: 102 mEq/L (ref 96–112)
Creatinine, Ser: 0.9 mg/dL (ref 0.4–1.2)
GFR: 66.22 mL/min (ref >60.00)
Glucose, Bld: 95 mg/dL (ref 70–99)
Sodium: 139 mEq/L (ref 135–145)

## 2010-07-18 LAB — LIPID PANEL
Cholesterol: 276 mg/dL — ABNORMAL HIGH (ref 0–200)
HDL: 95.1 mg/dL (ref >39.00)
Total CHOL/HDL Ratio: 3
Triglycerides: 186 mg/dL — ABNORMAL HIGH (ref 0.0–149.0)
VLDL: 37.2 mg/dL (ref 0.0–40.0)

## 2010-07-18 LAB — LDL CHOLESTEROL, DIRECT: Direct LDL: 149.2 mg/dL

## 2010-07-18 LAB — HEPATIC FUNCTION PANEL
ALT: 18 U/L (ref 0–35)
AST: 24 U/L (ref 0–37)
Total Bilirubin: 0.6 mg/dL (ref 0.3–1.2)

## 2010-07-21 ENCOUNTER — Encounter: Payer: Self-pay | Admitting: Family Medicine

## 2010-07-21 NOTE — Patient Instructions (Signed)
2 anxiety and depression however likely is continue alprazolam 0.25 mg 3 times daily or as needed Continue taking nuvigil for your narcolepsy 2 increase citalopram 40 mg each day, continue Wellbutrin 300 mg each day Continue estradiol for menopausal syndrome Continue diclofenac for arthritis but may certainly take it twice daily after meals For your back pain continued to use Lorcet Q4H p.r.n. And he need it but not over 3 per day We will call you the results of your lab studies Please call in 4-6 weeks regarding your depression

## 2010-07-21 NOTE — Progress Notes (Signed)
  Subjective:    Patient ID: Faith Santiago, female    DOB: 10-18-1940, 70 y.o.   MRN: [id number] This 70 year old white married female is in to discuss her medical problems, and refill her necessary medications and get this her lab studies she had EKG last year and has no chest symptoms she relates she had a mammogram 2 years ago and has been encouraged to get this in the very near future. She complains of stiffness of her joints and arthritic pain of her daughter works. Patient relates she isn't depressed despite her medications and we will consider changes. She continues to need the new ventral for narcolepsy. Desires to continue the estradiol for postmenopausal symptoms She relates she does have some lumbosacral back pain and at times needs analgesia of full relief as well as continuing baclofen acts and 5 mg b.i.d. She relates she is having some problem controlling her white and we have discussed this being very important. HPI    Review of Systems  Constitutional: Negative.   HENT: Negative.   Eyes: Negative.   Respiratory: Negative.   Cardiovascular: Negative.   Gastrointestinal: Negative.   Genitourinary: Negative.   Musculoskeletal: Positive for myalgias, back pain, joint swelling and arthralgias.  Skin: Negative.   Neurological:       Problem with help with Nuvigil 250  Hematological: Negative.   Psychiatric/Behavioral:       Anxiety and depression not controlled with present medications 2 change treatment       Objective:   Physical Exam This 70 year old white obese female appears to be in no distress cooperative and does appear somewhat depressed HEENT negative carotid pulses good . Thyroid nonpalpable Chest wall negative, breast are full no masses no tenderness no abnormalities nipples normal Heart no evidence of cardiomegaly heart sounds are good without murmurs peripheral pulses and good and equal bilaterally Lungs are clear to palpation percussion and auscultation no  rales no wheezing no dullness to percussion Abdomen obese liver spleen tenderness or nonpalpable no masses felt aorta was appears normal inguinal regions are negative Pelvic examination not done Musculoskeletal examination reveals some arthritic changes of the PIP joint some tenderness over both SI joints limitation of movement Neurological examination no abnormalities reflexes 1+ bilaterally        Assessment & Plan:  Exogenous obesity encouraged to follow a 1200  calorie diet Arthritis generalized to continue diclofenac 75 mg b.i.d. And use hydrocodone acetaminophen as needed Postmenopausal syndrome continue estradiol 1 mg q.d. Anxiety depression continue alprazolam 0.25 mg t.i.d. As well as increasing Celexa to 40 mg q.d. And continue on Wellbutrin 3 00  milligrams each day Narcolepsy continue Nuvigil

## 2010-07-23 ENCOUNTER — Other Ambulatory Visit: Payer: Self-pay

## 2010-08-08 ENCOUNTER — Other Ambulatory Visit: Payer: Self-pay | Admitting: Family Medicine

## 2010-08-10 ENCOUNTER — Other Ambulatory Visit: Payer: Self-pay | Admitting: Family Medicine

## 2010-08-16 ENCOUNTER — Other Ambulatory Visit: Payer: Self-pay

## 2010-08-16 NOTE — Telephone Encounter (Signed)
Pt called and stated she had an appointment in April and she has been out of town since that time.  Pt stated that none of her medications had been received or sent to the pharmacy in [place].  Pt stated that she does have her Celexa

## 2010-08-17 ENCOUNTER — Other Ambulatory Visit: Payer: Self-pay

## 2010-08-17 MED ORDER — HYDROCODONE-ACETAMINOPHEN 10-650 MG PO TABS: 1.0000 | ORAL_TABLET | Freq: Four times a day (QID) | ORAL | Status: DC | PRN

## 2010-08-17 MED ORDER — ERGOCALCIFEROL 1.25 MG (50000 UT) PO CAPS: 50000.0000 [IU] | ORAL_CAPSULE | ORAL | Status: DC

## 2010-08-17 MED ORDER — BUPROPION HCL ER (XL) 300 MG PO TB24: 300.0000 mg | ORAL_TABLET | Freq: Two times a day (BID) | ORAL | Status: DC

## 2010-08-17 MED ORDER — ARMODAFINIL 250 MG PO TABS: 250.0000 mg | ORAL_TABLET | Freq: Every day | ORAL | Status: DC

## 2010-08-17 MED ORDER — ALPRAZOLAM 0.25 MG PO TABS: 0.2500 mg | ORAL_TABLET | Freq: Every evening | ORAL | Status: DC | PRN

## 2010-08-17 MED ORDER — DICLOFENAC SODIUM 75 MG PO TBEC: 75.0000 mg | DELAYED_RELEASE_TABLET | Freq: Two times a day (BID) | ORAL | Status: DC

## 2010-08-17 NOTE — Telephone Encounter (Signed)
Ok to refill all medications per Dr. Scotty Court; pt called and requested all her medications be renewed; she has been out of town since her appt in April 2012

## 2010-08-24 ENCOUNTER — Telehealth: Payer: Self-pay

## 2010-08-24 NOTE — Telephone Encounter (Signed)
Received a call from [facility] concerning pt's Wellbutrin; pharmacisit would like to discuss possible drug interactions and dosage; please return call [phone]; ref # [id number]

## 2010-08-29 NOTE — Telephone Encounter (Signed)
Done

## 2010-08-31 NOTE — H&P (Signed)
[place]. [facility]  Patient:    Faith Santiago, Faith Santiago                         MRN: [id number] Adm. Date:  [id number] Attending:  Ivor Messier CC:         Vincenza Hews, M.D.             Guadelupe Sabin, M.D.             Verdie Drown, Montez Hageman., M.D.                         History and Physical  REASON FOR ADMISSION:  This was a planned outpatient surgical admission of this 70 year old white female admitted for cataract implant surgery of the right eye.  HISTORY OF PRESENT ILLNESS:  This patient has noted gradual deterioration of vision in the right eye.  She was seen by Dr. Elise Benne, a [facility] ophthalmologist, and was found to have a significant cataract in the right eye which required cataract surgery.  The patient was referred to my office where this diagnosis was confirmed, and arrangements were made for her outpatient admission at this time.  PAST MEDICAL HISTORY:  The patient is under the care of Dr. Dianna Limbo and is said to be in good general health.  MEDICATIONS:  The patient takes multiple medications under his direction including Prozac, Ortho-Prefest, alprazolam, femazepam, and guaifenex.  ALLERGIES:  None.  REVIEW OF SYSTEMS:  No cardiorespiratory complaints.  PHYSICAL EXAMINATION:  VITAL SIGNS:  As recorded on admission, blood pressure 124/89, temperature 97.2, pulse 88, respirations 18.  GENERAL:  The patient is a well-nourished, well-developed, anxious female in no acute distress.  HEENT:  Eyes: Visual acuity with best correction 20/200 right eye, 20/25 left eye.   External ocular and slit lamp examination: The eyes are white and clear with nuclear and posterior subcapsular cataract formation.   Fundus examination reveals a clear vitreous, attached retina, with normal optic nerve, blood vessels, and macula.  CHEST:  Lungs clear to percussion and auscultation.  HEART:  Normal sinus rhythm.  No cardiomegaly, no  murmurs.  ABDOMEN:  Negative.  EXTREMITIES:  negative.  ADMISSION DIAGNOSES: Posterior subcapsular cataract, greater in right than left eye.  SURGICAL PLAN:   Planned extracapsular cataract extraction with primary insertion of posterior chamber intraocular lens implant, right eye.DD: 09/07/99 TD:  09/07/99 Job: [id number]

## 2010-08-31 NOTE — Op Note (Signed)
[place]. [facility]  Patient:    Faith Santiago, FREEHLING                         MRN: [id number] Proc. Date: 09/07/99 Adm. Date:  [id number] Attending:  Ivor Messier CC:         Vincenza Hews, M.D.             Guadelupe Sabin, M.D.             Verdie Drown, Montez Hageman., M.D.                           Operative Report  PREOPERATIVE DIAGNOSIS:  Posterior subcapsular cataract, right eye.  POSTOPERATIVE DIAGNOSIS:  Posterior subcapsular cataract, right eye.  OPERATION:  Planned extracapsular cataract extraction with primary insertion of posterior chamber intraocular lens implant.  SURGEON:   Guadelupe Sabin, M.D.  ASSISTANT:  Nurse.  ANESTHESIA:  Local 4% Xylocaine, 0.75 Marcaine, retrobulbar block, topical tetracaine and intraocular Xylocaine.  Anesthesia standby required in this anxious patient.  The patient was given additional Versed during the procedure to aid in her comfort.  DESCRIPTION OF PROCEDURE:  After the patient was prepped and draped, a wire speculum was inserted in the right eye.  The eye was turned downward, and a superior rectus traction suture placed.  Schiotzs tonometry was recorded at 7 to 8 scale units with a 5.5 g weight.  A peritomy was performed adjacent to the limbus from the 11 to the 1 oclock position.  The corneoscleral junction was cleaned and a corneoscleral groove made with a 45 degree superblade.  The anterior chamber was then entered with the 2.5 mm diamond keratome at the 12 oclock position and a 15 degree blade at the 2:30 position.  Using a bent 26-gauge needle on a Healon syringe, a circular capsulorrhexis was begun and then completed with the Grabow forceps.  There was a long tag of anterior capsule present, and this was cut with ______ scissors at the 6 oclock position.  Hydrodissection and hydrodelineation were then performed using 1% Xylocaine.  The 30 degree phacoemulsification tip was then inserted with  slow controlled emulsification of the lens nucleus. Total ultrasonic time: 2 minutes 21 seconds.  Average power: 16%.  Total amount of fluid used during the emulsification: 50 cc.  Following removal of the nucleus, the residual cortex and posterior capsule plaque were removed with the irrigation aspiration tip.  The posterior capsule appeared intact with a brilliant red fundus reflex.  It was, therefore, elected to insert an [employer] Opt[employer] [id number] silicone posterior chamber intraocular lens implant with UV absorber, diopter strength +17.50.  This was inserted with a McDonald forceps folded, opened in the anterior chamber, and then centered into the capsular bag using the [facility] lens rotator.  The lens appeared to be well centered. The Healon which had been used during the procedure was then aspirated and replaced with balanced salt solution with Miochol added.  The lens appeared to be well centered.  The operative incision appeared to be self sealing, and no sutures were required.  A small amount of Pilopine ophthalmic ointment and Maxitrol ointment were instilled in the conjunctival cul-de-sac.   A light patch and protector shield were applied.  Duration of procedure and anesthesia administration: 45 minutes.  The patient tolerated the procedure well in general and left the operating room for the main recovery room  as she was still quite sedated.  She would then be taken to the outpatient recovery room and discharged home to be seen in the office the following morning. DD:  09/07/99 TD:  09/07/99 Job: [id number]

## 2011-02-18 ENCOUNTER — Other Ambulatory Visit: Payer: Self-pay | Admitting: Family Medicine

## 2011-02-18 NOTE — Telephone Encounter (Signed)
Pt need new rx fax to [facility] [phone] generic nuvigil 250mg  #90.

## 2011-02-20 NOTE — Telephone Encounter (Signed)
Called and spoke with pt and she states that she and her husband were possibly discussing transferring to [facility].  Pt would like to know if she can have her medication filled for Nuvigil #90.  Pls advise.

## 2011-02-20 NOTE — Telephone Encounter (Signed)
Refill once only until she can establish follow up

## 2011-02-21 MED ORDER — ARMODAFINIL 250 MG PO TABS: 250.0000 mg | ORAL_TABLET | Freq: Every day | ORAL | Status: DC

## 2011-02-21 NOTE — Telephone Encounter (Signed)
rx sent into pharmacy

## 2011-04-21 ENCOUNTER — Other Ambulatory Visit: Payer: Self-pay | Admitting: Family Medicine

## 2011-06-12 ENCOUNTER — Telehealth: Payer: Self-pay | Admitting: Family Medicine

## 2011-06-12 NOTE — Telephone Encounter (Signed)
Will need to wait to establish with me.

## 2011-06-12 NOTE — Telephone Encounter (Signed)
Pt requesting refill on her Nuvigil. Uses [facility] mail order. Is establishing with Sharen Hones, but doesn't have an appt until 4/23. Thanks!

## 2011-06-12 NOTE — Telephone Encounter (Signed)
Pls advise.  

## 2011-06-13 MED ORDER — ARMODAFINIL 250 MG PO TABS: 250.0000 mg | ORAL_TABLET | Freq: Every day | ORAL | Status: DC

## 2011-06-13 NOTE — Telephone Encounter (Signed)
Refill once until follow up with new primary

## 2011-06-13 NOTE — Telephone Encounter (Signed)
Pls advise.  

## 2011-06-13 NOTE — Telephone Encounter (Signed)
Rx sent to [facility]

## 2011-06-28 ENCOUNTER — Other Ambulatory Visit: Payer: Self-pay | Admitting: Family Medicine

## 2011-06-28 NOTE — Telephone Encounter (Signed)
Rx called in on 04/20/28 #90.

## 2011-07-22 ENCOUNTER — Ambulatory Visit (INDEPENDENT_AMBULATORY_CARE_PROVIDER_SITE_OTHER): Payer: Medicare Other | Admitting: Family Medicine

## 2011-07-22 VITALS — BP 110/74 | HR 83 | Temp 97.6°F | Resp 16 | Ht 64.5 in | Wt 190.0 lb

## 2011-07-22 DIAGNOSIS — S30860A Insect bite (nonvenomous) of lower back and pelvis, initial encounter: Secondary | ICD-10-CM

## 2011-07-22 DIAGNOSIS — W57XXXA Bitten or stung by nonvenomous insect and other nonvenomous arthropods, initial encounter: Secondary | ICD-10-CM

## 2011-07-22 MED ORDER — DOXYCYCLINE HYCLATE 100 MG PO CAPS: 100.0000 mg | ORAL_CAPSULE | Freq: Two times a day (BID) | ORAL | Status: AC

## 2011-07-22 NOTE — Progress Notes (Signed)
  Patient Name: Faith Santiago Date of Birth: April 06, 1941 Medical Record Number: [id number] Gender: female Date of Encounter: 07/22/2011  History of Present Illness:  Faith Santiago is a 71 y.o. very pleasant female patient who presents with the following:  Here today to evaluate a tick bite- she had a tick attached to her side which she pulled off 2 days ago- it was engorged  He husband had lyme disease last year and a friend died or RMSF so she is worried about the bite.   She feels well currently and has a slight rash around the area of the bite.  She brought the tick in for [place] to see- it is a DEER tick  Patient Active Problem List  Diagnoses  . HYPOTHYROIDISM  . HYPERLIPIDEMIA  . ANEMIA  . ANXIETY DEPRESSION  . NARCOLEPSY WITHOUT CATAPLEXY  . GERD  . ARTHRITIS  . OSTEOPENIA  . OTHER MALAISE AND FATIGUE  . UNS ADVRS EFF OTH RX MEDICINAL&BIOLOGICAL SBSTNC   Past Medical History  Diagnosis Date  . Anxiety   . Arthritis   . Depression   . Narcolepsy    Past Surgical History  Procedure Date  . Vesicovaginal fistula closure w/ tah   . Bilateral catarct removal    History  Substance Use Topics  . Smoking status: Never Smoker   . Smokeless tobacco: Never Used  . Alcohol Use: No   No family history on file. Allergies  Allergen Reactions  . Ciprofloxacin     REACTION: sun induced rash    Medication list has been reviewed and updated.  Review of Systems: As per HPI- otherwise negative.   Physical Examination: Filed Vitals:   07/22/11 1435  BP: 110/74  Pulse: 83  Temp: 97.6 F (36.4 C)  TempSrc: Oral  Resp: 16  Height: 5' 4.5" (1.638 m)  Weight: 190 lb (86.183 kg)    Body mass index is 32.11 kg/(m^2).  GEN: WDWN, NAD, Non-toxic, A & O x 3 HEENT: Atraumatic, Normocephalic. Neck supple. No masses, No LAD. Ears and Nose: No external deformity. CV: RRR, No M/G/R. No JVD. No thrill. No extra heart sounds. PULM: CTA B, no wheezes, crackles, rhonchi. No  retractions. No resp. distress. No accessory muscle use. There is a large hive type rash around the site of the bite on her left side EXTR: No c/c/e NEURO Normal gait.  PSYCH: Normally interactive. Conversant. Not depressed or anxious appearing.  Calm demeanor.  There is an inflamed area on her left side where she removed the tick  Assessment and Plan: 1. Tick bite  doxycycline (VIBRAMYCIN) 100 MG capsule   Exposure to a tick- she is understandable worried about tick borne illness.  Will treat with doxy BID for 2 weeks as above- she will let us know if any problems or other symptoms arise.

## 2011-08-06 ENCOUNTER — Other Ambulatory Visit (HOSPITAL_COMMUNITY)
Admission: RE | Admit: 2011-08-06 | Discharge: 2011-08-06 | Disposition: A | Discharge status: Home / Self Care | Payer: Medicare Other | Source: Ambulatory Visit | Attending: Family Medicine | Admitting: Family Medicine

## 2011-08-06 ENCOUNTER — Ambulatory Visit (INDEPENDENT_AMBULATORY_CARE_PROVIDER_SITE_OTHER): Payer: Medicare Other | Admitting: Family Medicine

## 2011-08-06 ENCOUNTER — Encounter: Payer: Self-pay | Admitting: Family Medicine

## 2011-08-06 ENCOUNTER — Telehealth: Payer: Self-pay | Admitting: *Deleted

## 2011-08-06 VITALS — BP 114/78 | HR 76 | Temp 97.8°F | Ht 64.5 in | Wt 194.8 lb

## 2011-08-06 DIAGNOSIS — E039 Hypothyroidism, unspecified: Secondary | ICD-10-CM

## 2011-08-06 DIAGNOSIS — E8941 Symptomatic postprocedural ovarian failure: Secondary | ICD-10-CM

## 2011-08-06 DIAGNOSIS — M899 Disorder of bone, unspecified: Secondary | ICD-10-CM

## 2011-08-06 DIAGNOSIS — Z01419 Encounter for gynecological examination (general) (routine) without abnormal findings: Secondary | ICD-10-CM | POA: Insufficient documentation

## 2011-08-06 DIAGNOSIS — Z1231 Encounter for screening mammogram for malignant neoplasm of breast: Secondary | ICD-10-CM

## 2011-08-06 DIAGNOSIS — D649 Anemia, unspecified: Secondary | ICD-10-CM

## 2011-08-06 DIAGNOSIS — E8940 Asymptomatic postprocedural ovarian failure: Secondary | ICD-10-CM

## 2011-08-06 DIAGNOSIS — E785 Hyperlipidemia, unspecified: Secondary | ICD-10-CM

## 2011-08-06 DIAGNOSIS — F341 Dysthymic disorder: Secondary | ICD-10-CM

## 2011-08-06 DIAGNOSIS — Z0000 Encounter for general adult medical examination without abnormal findings: Secondary | ICD-10-CM

## 2011-08-06 DIAGNOSIS — Z1159 Encounter for screening for other viral diseases: Secondary | ICD-10-CM | POA: Insufficient documentation

## 2011-08-06 DIAGNOSIS — D7589 Other specified diseases of blood and blood-forming organs: Secondary | ICD-10-CM

## 2011-08-06 MED ORDER — FLUOXETINE HCL 20 MG PO TABS: 20.0000 mg | ORAL_TABLET | Freq: Every day | ORAL | Status: DC

## 2011-08-06 MED ORDER — FLUOXETINE HCL 40 MG PO CAPS: 40.0000 mg | ORAL_CAPSULE | Freq: Every day | ORAL | Status: DC

## 2011-08-06 NOTE — Patient Instructions (Addendum)
Good to see you today, call us with questions. Pass by Marion's office to schedule mammogram. Breast and pelvic exam today.  (pelvic exam every 2 years). If any more falls, please return for further evaluation. May stop celexa and start prozac at 20mg .  If doing well after 3-4 weeks, may double up on dose - let us know and we will send in 40mg  prozac

## 2011-08-06 NOTE — Assessment & Plan Note (Addendum)
I have personally reviewed the Medicare Annual Wellness questionnaire and have noted 1. The patient's medical and social history 2. Their use of alcohol, tobacco or illicit drugs 3. Their current medications and supplements 4. The patient's functional ability including ADL's, fall risks, home safety risks and hearing or visual impairment. 5. Diet and physical activity 6. Evidence for depression or mood disorders The patients weight, height, BMI have been recorded in the chart.  Hearing and vision has been addressed. I have made referrals, counseling and provided education to the patient based review of the above and I have provided the pt with a written personalized care plan for preventive services. See scanned questionairre. Advanced directives discussed: has this at home.  would want husband to be HCPOA  Reviewed preventative protocols and updated unless pt declined. Pt will check into zostavax and let us know what she decides Not due for colonoscopy yet. rec schedule mammo and vision exam. Discussed healthy diet and lifestyle

## 2011-08-06 NOTE — Progress Notes (Signed)
Subjective:    Patient ID: Faith Santiago, female    DOB: [birth year], 71 y.o.   MRN: [id number]  HPI CC: medicare wellness visit  Transfer of care from Dr. Scotty Court, new pt to me presents for medicare wellness visit.  Citalopram - not helping.  On citalopram 20mg  daily for last 3 years.  Doesn't seem to be helping.  Would like switch to 40mg  prozac.  Prior on prozac and thinks this helped more (although feels like became less effective over time).  Preventative: Usually healthy. Last CPE was 07/2010  mammo - last 2009, normal Pelvic - last was 2 yrs ago.  S/p hysterectomy for uterine cancer. Colonoscopy - 6-7 yrs ago, normal per pt. DEXA 2008 - normal. Advanced directives - living will at home.  Would want husband to be HCPOA.  Vision - no recent vision exam.  R side difficulty longstanding. [facility] Hearing - no problems.  caffeine: couple cups/day Lives with husband, 2 grown children, 1 cat Occupation: retired, was OT for [employer] Activity: volunteer work, involved in organizations Diet: overeating, good amt water, vegetables daily, occasional fruits  Medications and allergies reviewed and updated in chart.  Past histories reviewed and updated if relevant as below. Patient Active Problem List  Diagnoses  . HYPOTHYROIDISM  . HYPERLIPIDEMIA  . ANEMIA  . ANXIETY DEPRESSION  . NARCOLEPSY WITHOUT CATAPLEXY  . GERD  . ARTHRITIS  . OSTEOPENIA  . OTHER MALAISE AND FATIGUE  . UNS ADVRS EFF OTH RX MEDICINAL&BIOLOGICAL SBSTNC  . Medicare annual wellness visit, subsequent   Past Medical History  Diagnosis Date  . Anxiety   . Arthritis   . Depression   . Narcolepsy   . Anemia, unspecified   . Esophageal reflux   . Other and unspecified hyperlipidemia   . Osteopenia    Past Surgical History  Procedure Date  . Vesicovaginal fistula closure w/ tah   . Bilateral catarct removal    History  Substance Use Topics  . Smoking status: Never Smoker   . Smokeless  tobacco: Never Used  . Alcohol Use: Yes     1 drink /day   Family History  Problem Relation Age of Onset  . Hypothyroidism Mother   . Stroke Mother     ministroke  . Coronary artery disease Mother     CABG  . Cancer Father     prostate  . Diabetes Neg Hx    Allergies  Allergen Reactions  . Ciprofloxacin     REACTION: sun induced rash   Current Outpatient Prescriptions on File Prior to Visit  Medication Sig Dispense Refill  . ALPRAZolam (XANAX) 0.25 MG tablet Take 1 tablet (0.25 mg total) by mouth at bedtime as needed.  90 tablet  1  . Armodafinil (NUVIGIL) 250 MG tablet Take 1 tablet (250 mg total) by mouth daily.  90 tablet  0  . diclofenac (VOLTAREN) 75 MG EC tablet Take 1 tablet (75 mg total) by mouth 2 (two) times daily.  180 tablet  11  . estradiol (ESTRACE) 1 MG tablet Take 0.5 mg by mouth daily.       Marland Kitchen HYDROcodone-acetaminophen (LORCET) 10-650 MG per tablet Take 1 tablet by mouth every 6 (six) hours as needed. Do not exceed 4 tablets daily  360 tablet  1  . aspirin 81 MG EC tablet Take 81 mg by mouth. Three times weekly        Review of Systems  Constitutional: Negative for fever, chills, activity change, appetite  change, fatigue and unexpected weight change.  HENT: Negative for hearing loss and neck pain.   Gastrointestinal: Negative for nausea, vomiting, abdominal pain, diarrhea, constipation, blood in stool and abdominal distention.  Genitourinary: Negative for hematuria and difficulty urinating.  Musculoskeletal: Negative for myalgias and arthralgias.  Skin: Negative for rash.  Neurological: Negative for dizziness, seizures, syncope and headaches.  Psychiatric/Behavioral: Negative for dysphoric mood. The patient is not nervous/anxious.        Objective:   Physical Exam  Nursing note and vitals reviewed. Constitutional: She is oriented to person, place, and time. She appears well-developed and well-nourished. No distress.  HENT:  Head: Normocephalic and  atraumatic.  Right Ear: External ear normal.  Left Ear: External ear normal.  Nose: Nose normal.  Mouth/Throat: Oropharynx is clear and moist. No oropharyngeal exudate.  Eyes: Conjunctivae and EOM are normal. Pupils are equal, round, and reactive to light. No scleral icterus.  Neck: Normal range of motion. Neck supple. No thyromegaly present.  Cardiovascular: Normal rate, regular rhythm, normal heart sounds and intact distal pulses.   No murmur heard. Pulses:      Radial pulses are 2+ on the right side, and 2+ on the left side.  Pulmonary/Chest: Effort normal and breath sounds normal. No respiratory distress. She has no wheezes. She has no rales. Right breast exhibits no inverted nipple, no mass, no nipple discharge, no skin change and no tenderness. Left breast exhibits no inverted nipple, no mass, no nipple discharge, no skin change and no tenderness. Breasts are symmetrical.  Abdominal: Soft. Bowel sounds are normal. She exhibits no distension and no mass. There is no tenderness. There is no rebound and no guarding.  Genitourinary: Vagina normal. Pelvic exam was performed with patient supine. There is no rash, tenderness, lesion or injury on the right labia. There is no rash, tenderness, lesion or injury on the left labia. Right adnexum displays no mass, no tenderness and no fullness. Left adnexum displays no mass, no tenderness and no fullness. No signs of injury around the vagina. No vaginal discharge found.       Cervix, uterus absent  Musculoskeletal: Normal range of motion. She exhibits no edema.  Lymphadenopathy:    She has no cervical adenopathy.    She has no axillary adenopathy.       Right axillary: No lateral adenopathy present.       Left axillary: No lateral adenopathy present.      Right: No supraclavicular adenopathy present.       Left: No supraclavicular adenopathy present.  Neurological: She is alert and oriented to person, place, and time.       CN grossly intact,  station and gait intact  Skin: Skin is warm and dry. No rash noted.  Psychiatric: She has a normal mood and affect. Her behavior is normal. Judgment and thought content normal.       Assessment & Plan:

## 2011-08-06 NOTE — Telephone Encounter (Signed)
Patient was asking about labs. She said you had mentioned them, but I didn't see any ordered. I told her I would ask and call her to schedule lab appt if you wanted them. She also asked for a refill on her xanax and vicodin. She forgot to ask at her visit.

## 2011-08-07 DIAGNOSIS — E8940 Asymptomatic postprocedural ovarian failure: Secondary | ICD-10-CM | POA: Insufficient documentation

## 2011-08-07 NOTE — Assessment & Plan Note (Signed)
Check blood work when returns fasting.

## 2011-08-07 NOTE — Assessment & Plan Note (Signed)
No dx of this - requests recheck as stays fatigued.

## 2011-08-07 NOTE — Assessment & Plan Note (Signed)
Will remove from list as last dexa 2008 WNL.

## 2011-08-07 NOTE — Assessment & Plan Note (Signed)
longterm on estrogen.  rec decrease to 0.5mg  daily and monitor sxs (prior when tried to come off - hot flashes)

## 2011-08-07 NOTE — Assessment & Plan Note (Signed)
Discussed change from celexa to prozac, start at 20mg  and may titrate up to 40mg  as needed.

## 2011-08-08 MED ORDER — HYDROCODONE-ACETAMINOPHEN 10-650 MG PO TABS: 1.0000 | ORAL_TABLET | Freq: Four times a day (QID) | ORAL | Status: DC | PRN

## 2011-08-08 MED ORDER — ALPRAZOLAM 0.25 MG PO TABS: 0.2500 mg | ORAL_TABLET | Freq: Every evening | ORAL | Status: DC | PRN

## 2011-08-08 NOTE — Telephone Encounter (Signed)
plz phone in refill.  placed orders for labs in cahrt.

## 2011-08-09 NOTE — Telephone Encounter (Signed)
Rx's called in as directed. Message left notifying patient and advising to call and schedule fasting labs at her convenience.

## 2011-08-13 ENCOUNTER — Ambulatory Visit (INDEPENDENT_AMBULATORY_CARE_PROVIDER_SITE_OTHER): Payer: Medicare Other | Admitting: Family Medicine

## 2011-08-13 ENCOUNTER — Encounter: Payer: Self-pay | Admitting: *Deleted

## 2011-08-13 DIAGNOSIS — Z0000 Encounter for general adult medical examination without abnormal findings: Secondary | ICD-10-CM

## 2011-08-13 DIAGNOSIS — D7589 Other specified diseases of blood and blood-forming organs: Secondary | ICD-10-CM

## 2011-08-13 DIAGNOSIS — E785 Hyperlipidemia, unspecified: Secondary | ICD-10-CM

## 2011-08-13 LAB — CBC WITH DIFFERENTIAL/PLATELET
Basophils Relative: 0.4 % (ref 0.0–3.0)
Eosinophils Absolute: 0.1 10*3/uL (ref 0.0–0.7)
Eosinophils Relative: 2 % (ref 0.0–5.0)
Hemoglobin: 11.9 g/dL — ABNORMAL LOW (ref 12.0–15.0)
Lymphocytes Relative: 32.3 % (ref 12.0–46.0)
MCHC: 33.8 g/dL (ref 30.0–36.0)
Monocytes Relative: 10.7 % (ref 3.0–12.0)
Neutro Abs: 2.9 10*3/uL (ref 1.4–7.7)
RBC: 3.48 Mil/uL — ABNORMAL LOW (ref 3.87–5.11)
WBC: 5.3 10*3/uL (ref 4.5–10.5)

## 2011-08-13 LAB — COMPREHENSIVE METABOLIC PANEL
ALT: 24 U/L (ref 0–35)
BUN: 16 mg/dL (ref 6–23)
CO2: 28 mEq/L (ref 19–32)
Creatinine, Ser: 0.8 mg/dL (ref 0.4–1.2)
GFR: 75.62 mL/min (ref >60.00)
Total Bilirubin: 0.4 mg/dL (ref 0.3–1.2)

## 2011-08-13 LAB — LIPID PANEL
Cholesterol: 196 mg/dL (ref 0–200)
HDL: 83.9 mg/dL (ref >39.00)
VLDL: 47.4 mg/dL — ABNORMAL HIGH (ref 0.0–40.0)

## 2011-08-13 NOTE — Progress Notes (Signed)
Lab visit only. 

## 2011-08-15 ENCOUNTER — Telehealth: Payer: Self-pay | Admitting: Family Medicine

## 2011-08-15 DIAGNOSIS — D539 Nutritional anemia, unspecified: Secondary | ICD-10-CM

## 2011-08-15 NOTE — Telephone Encounter (Signed)
Has had drop in blood count - please have her come at her convenience to check vitamin levels.  placed order in chart.

## 2011-08-15 NOTE — Telephone Encounter (Signed)
Message left with patient's husband to return my call to schedule labs.

## 2011-08-15 NOTE — Telephone Encounter (Signed)
Message copied by Eustaquio Boyden on Thu Aug 15, 2011 12:53 PM ------      Message from: Baldomero Lamy      Created: Thu Aug 15, 2011 12:49 PM       These test cannot be added, has to be added within 24 hours and her blood was drawn 4/30.       ----- Message -----         From: Eustaquio Boyden, MD         Sent: 08/14/2011   8:11 PM           To: Philis Fendt Chavers            Can we add b12 and folate to blood in lab?  Indication: macrocytic anemia

## 2011-08-16 ENCOUNTER — Other Ambulatory Visit (INDEPENDENT_AMBULATORY_CARE_PROVIDER_SITE_OTHER): Payer: Medicare Other

## 2011-08-16 DIAGNOSIS — D539 Nutritional anemia, unspecified: Secondary | ICD-10-CM

## 2011-08-16 LAB — VITAMIN B12: Vitamin B-12: 372 pg/mL (ref 211–911)

## 2011-08-16 LAB — FOLATE: Folate: 19.2 ng/mL (ref >5.9)

## 2011-08-19 ENCOUNTER — Ambulatory Visit
Admission: RE | Admit: 2011-08-19 | Discharge: 2011-08-19 | Disposition: A | Discharge status: Home / Self Care | Payer: Medicare Other | Source: Ambulatory Visit | Attending: Family Medicine | Admitting: Family Medicine

## 2011-08-19 DIAGNOSIS — Z1231 Encounter for screening mammogram for malignant neoplasm of breast: Secondary | ICD-10-CM

## 2011-08-21 ENCOUNTER — Encounter: Payer: Self-pay | Admitting: *Deleted

## 2011-09-24 ENCOUNTER — Other Ambulatory Visit: Payer: Self-pay

## 2011-09-24 NOTE — Telephone Encounter (Signed)
Pt seen 08/06/11; pt taking fluoxetine 20 mg for 7 weeks and doing OK; would like increased to 40 mg taking one daily then 90 day with 1 yrs [facility]ill [facility]. Pt also request Estradiol 1 mg pt taking one daily( on pt med list taking 1/2 tab daily) for 90 day with 1 yrs refill [facility]. Pt also request 90 day with 1 yrs refill on Nuvigil,diclofenac, alprazolam and hydrocodone-APAP [facility]. Pt said she has always gotten all meds from mail order.Please advise.

## 2011-09-27 MED ORDER — ARMODAFINIL 250 MG PO TABS: 250.0000 mg | ORAL_TABLET | Freq: Every day | ORAL | Status: DC

## 2011-09-27 MED ORDER — FLUOXETINE HCL 20 MG PO TABS: 40.0000 mg | ORAL_TABLET | Freq: Every day | ORAL | Status: DC

## 2011-09-27 MED ORDER — HYDROCODONE-ACETAMINOPHEN 10-650 MG PO TABS: 1.0000 | ORAL_TABLET | Freq: Four times a day (QID) | ORAL | Status: DC | PRN

## 2011-09-27 MED ORDER — FLUOXETINE HCL 40 MG PO CAPS: 40.0000 mg | ORAL_CAPSULE | Freq: Every day | ORAL | Status: DC

## 2011-09-27 MED ORDER — DICLOFENAC SODIUM 75 MG PO TBEC: 75.0000 mg | DELAYED_RELEASE_TABLET | Freq: Two times a day (BID) | ORAL | Status: DC

## 2011-09-27 MED ORDER — ALPRAZOLAM 0.25 MG PO TABS: 0.2500 mg | ORAL_TABLET | Freq: Every evening | ORAL | Status: DC | PRN

## 2011-09-27 MED ORDER — ESTRADIOL 1 MG PO TABS: 0.5000 mg | ORAL_TABLET | Freq: Every day | ORAL | Status: DC

## 2011-09-27 NOTE — Telephone Encounter (Signed)
Sent in 40mg  fluoxetine (20mg , take 2 a day, no 40mg  dose?) 90 day supply. Sent in diclofenac. Estradiol - I had asked her to drop to 0.5mg  daily.  Did she not tolerate lower dose? All other meds - I don't do 3 mo supply of controlled substances.  Can we phone in 1 mo supply to pt's preference pharmacy?

## 2011-09-27 NOTE — Telephone Encounter (Signed)
Patient said she forgot to decrease estradiol. She will start that decrease now. Fluoxetine 40 mg comes in capsule form which is fine with patient. Sent in capsule. She wants all other meds sent [facility] with 1 month supply. Attempted to call them in and they have a new policy that requires all controlled substances to have a written script to be faxed. Will forward to Dr. Para March to print written scripts in your absence.

## 2011-09-27 NOTE — Telephone Encounter (Signed)
Printed and signed, please notify Dr. Reece Agar as a FYI.  Thanks.

## 2011-09-27 NOTE — Telephone Encounter (Signed)
Rx's faxed to [facility]. Dr. Reece Agar aware.

## 2011-10-01 ENCOUNTER — Telehealth: Payer: Self-pay | Admitting: Family Medicine

## 2011-10-01 NOTE — Telephone Encounter (Signed)
[facility] is requesting to speak with the doctor about medications sent on 6/14. Please call at [phone] with [phone]

## 2011-10-02 NOTE — Telephone Encounter (Signed)
Have tried multiple times to call and the number rings busy. Will try again later.

## 2011-10-03 NOTE — Telephone Encounter (Signed)
Can we call and touch base on what they need?

## 2011-10-03 NOTE — Telephone Encounter (Signed)
Have tried again to call multiple times. The phone still rings a "fast busy". There is either something wrong with the phone or the wrong phone number was provided. Will await return call from [facility].

## 2011-11-15 ENCOUNTER — Other Ambulatory Visit: Payer: Self-pay | Admitting: *Deleted

## 2011-11-15 MED ORDER — ARMODAFINIL 250 MG PO TABS: 250.0000 mg | ORAL_TABLET | Freq: Every day | ORAL | Status: DC

## 2011-11-15 MED ORDER — HYDROCODONE-ACETAMINOPHEN 10-650 MG PO TABS: 1.0000 | ORAL_TABLET | Freq: Four times a day (QID) | ORAL | Status: DC | PRN

## 2011-11-15 NOTE — Telephone Encounter (Signed)
Rx's must be printed for [facility]. Scripts printed and given to patient's husband for mailing.

## 2011-11-15 NOTE — Telephone Encounter (Signed)
Faxed refill request   

## 2011-11-15 NOTE — Telephone Encounter (Signed)
plz phone in. 

## 2011-12-17 ENCOUNTER — Other Ambulatory Visit: Payer: Self-pay | Admitting: *Deleted

## 2011-12-17 NOTE — Telephone Encounter (Signed)
90 day refill for mail order

## 2011-12-18 MED ORDER — ALPRAZOLAM 0.25 MG PO TABS: 0.2500 mg | ORAL_TABLET | Freq: Every evening | ORAL | Status: DC | PRN

## 2011-12-18 NOTE — Telephone Encounter (Signed)
Defer to PCP

## 2011-12-18 NOTE — Telephone Encounter (Signed)
plz mail in 1 month supply to [facility]. Needs to be printed for [facility].

## 2011-12-18 NOTE — Telephone Encounter (Signed)
Rx printed and faxed as directed.

## 2012-01-02 ENCOUNTER — Other Ambulatory Visit: Payer: Self-pay | Admitting: *Deleted

## 2012-01-02 MED ORDER — ARMODAFINIL 250 MG PO TABS: 250.0000 mg | ORAL_TABLET | Freq: Every day | ORAL | Status: DC

## 2012-01-02 MED ORDER — HYDROCODONE-ACETAMINOPHEN 10-650 MG PO TABS: 1.0000 | ORAL_TABLET | Freq: Four times a day (QID) | ORAL | Status: DC | PRN

## 2012-01-02 NOTE — Telephone Encounter (Signed)
Signed and placed in kim's box. 

## 2012-01-02 NOTE — Telephone Encounter (Signed)
OK to refill? Forms requiring signature in your IN box will need to be faxed. No written script needed if forms are signed.

## 2012-01-03 NOTE — Telephone Encounter (Signed)
Forms faxed as directed

## 2012-01-24 ENCOUNTER — Other Ambulatory Visit: Payer: Self-pay | Admitting: *Deleted

## 2012-01-24 NOTE — Telephone Encounter (Signed)
OK to refill? Forms for [facility] in your IN box.

## 2012-01-27 MED ORDER — ALPRAZOLAM 0.25 MG PO TABS: 0.2500 mg | ORAL_TABLET | Freq: Every evening | ORAL | Status: DC | PRN

## 2012-01-27 MED ORDER — ARMODAFINIL 250 MG PO TABS: 250.0000 mg | ORAL_TABLET | Freq: Every day | ORAL | Status: DC

## 2012-01-27 MED ORDER — HYDROCODONE-ACETAMINOPHEN 10-650 MG PO TABS: 1.0000 | ORAL_TABLET | Freq: Four times a day (QID) | ORAL | Status: DC | PRN

## 2012-01-27 NOTE — Telephone Encounter (Signed)
Rx's faxed to [facility] as directed.

## 2012-01-27 NOTE — Telephone Encounter (Signed)
I will just do 1 mo supply of controlled substances.  Placed in Kim[facility] box

## 2012-04-06 ENCOUNTER — Other Ambulatory Visit: Payer: Self-pay | Admitting: *Deleted

## 2012-04-06 MED ORDER — ALPRAZOLAM 0.25 MG PO TABS: 0.2500 mg | ORAL_TABLET | Freq: Every evening | ORAL | Status: DC | PRN

## 2012-04-06 MED ORDER — HYDROCODONE-ACETAMINOPHEN 10-650 MG PO TABS: 1.0000 | ORAL_TABLET | Freq: Four times a day (QID) | ORAL | Status: DC | PRN

## 2012-04-06 MED ORDER — ARMODAFINIL 250 MG PO TABS: 250.0000 mg | ORAL_TABLET | Freq: Every day | ORAL | Status: DC

## 2012-04-06 NOTE — Telephone Encounter (Signed)
plz phone in. 

## 2012-04-06 NOTE — Telephone Encounter (Signed)
Rx's faxed to [facility] and then shredded.

## 2012-04-06 NOTE — Telephone Encounter (Signed)
Printed and placed in Kim[facility] box. 

## 2012-04-06 NOTE — Telephone Encounter (Signed)
These will need to be printed so I can fax them to [facility].

## 2012-05-11 ENCOUNTER — Other Ambulatory Visit: Payer: Self-pay

## 2012-05-11 NOTE — Telephone Encounter (Signed)
Pt has changed to [facility] medicare and now using [facility] for pharmacy. Pt request hydrocodone apap,alprazolam and nuvigil called to [facility]. Pt does not want written rx.Please advise.

## 2012-05-12 MED ORDER — ARMODAFINIL 250 MG PO TABS: 250.0000 mg | ORAL_TABLET | Freq: Every day | ORAL | Status: DC

## 2012-05-12 MED ORDER — HYDROCODONE-ACETAMINOPHEN 10-650 MG PO TABS: 1.0000 | ORAL_TABLET | Freq: Four times a day (QID) | ORAL | Status: DC | PRN

## 2012-05-12 MED ORDER — ALPRAZOLAM 0.25 MG PO TABS: 0.2500 mg | ORAL_TABLET | Freq: Every evening | ORAL | Status: DC | PRN

## 2012-05-12 NOTE — Telephone Encounter (Signed)
i don't write 3 mo supply for these meds - will send in 1 mo supply at a time.  plz phone in.

## 2012-05-13 ENCOUNTER — Telehealth: Payer: Self-pay

## 2012-05-13 MED ORDER — HYDROCODONE-ACETAMINOPHEN 10-325 MG PO TABS: 1.0000 | ORAL_TABLET | Freq: Four times a day (QID) | ORAL | Status: DC | PRN

## 2012-05-13 NOTE — Telephone Encounter (Signed)
Rx called in as directed.   

## 2012-05-13 NOTE — Telephone Encounter (Signed)
Changed lortab dose.  plz phone in.  Thanks.

## 2012-05-13 NOTE — Telephone Encounter (Signed)
Lortab 10-650 is no longer available. They have 10-300 or 10-325. Please advise which you want for patient and dosing. Nuvigil and xanax called in as directed.

## 2012-05-13 NOTE — Telephone Encounter (Signed)
Pt called for status of refills requested earlier in week; advised meds were called in earlier today. Pt will wait to hear from [facility]; if no reponse in 2 days pt will call [facility].(see01/27/14 note).

## 2012-05-21 ENCOUNTER — Telehealth: Payer: Self-pay | Admitting: *Deleted

## 2012-05-21 NOTE — Telephone Encounter (Signed)
Filled and placed in Kim's out box

## 2012-05-21 NOTE — Telephone Encounter (Signed)
Form faxed

## 2012-05-21 NOTE — Telephone Encounter (Signed)
PA form for Nuvigil in your IN box.

## 2012-05-26 NOTE — Telephone Encounter (Signed)
Additional information requested for PA; form in Dr Timoteo Expose in box.

## 2012-06-01 NOTE — Telephone Encounter (Signed)
plz check with patient - how long has she been on nuvigil and has she ever had sleep study for her narcolepsy? Working on [employer] for [employer], insurance may require sleep study and pulm referral to continue medication.

## 2012-06-01 NOTE — Telephone Encounter (Signed)
No sleep study per patient. She is aware that she may need one in order to continue medication. She has been on med since 2009 per recollection and chart had documented 09/30/07. Paperwork faxed and will await determination.

## 2012-06-18 ENCOUNTER — Telehealth: Payer: Self-pay

## 2012-06-18 ENCOUNTER — Telehealth: Payer: Self-pay | Admitting: Family Medicine

## 2012-06-18 MED ORDER — HYDROCODONE-ACETAMINOPHEN 10-325 MG PO TABS: 1.0000 | ORAL_TABLET | Freq: Four times a day (QID) | ORAL | Status: DC | PRN

## 2012-06-18 MED ORDER — ALPRAZOLAM 0.25 MG PO TABS: 0.2500 mg | ORAL_TABLET | Freq: Every evening | ORAL | Status: DC | PRN

## 2012-06-18 NOTE — Telephone Encounter (Signed)
2nd call today regarding medication refills from new pharmacy [facility]. Insurance changed 04/15/12.  Per earlier message 06/18/12, Nuvigil prior authorization was approved.  Previously advised to call pharmacy and have pharmacy call the office,  if needed.  Now requesting refills for Hydrocodone and Alprazolam too; last filled 05/14/12.  Has only a couple of doses left. Please call back.

## 2012-06-18 NOTE — Telephone Encounter (Signed)
plz phone in and notify pt. 

## 2012-06-18 NOTE — Telephone Encounter (Signed)
Pt request status of Nuvigil and new rx sent [facility]. Explained just received approval letter from [facility] this AM and letter along with [facility] PA form [facility]ed [facility]. Should not need new rx. Pt will ck with [facility] later to verify med will be sent and if problem pt will have [facility] contact our office.

## 2012-06-18 NOTE — Telephone Encounter (Signed)
Approval letter already signed by Dr Reece Agar; faxed to [facility] and sent for scanning.

## 2012-06-22 NOTE — Telephone Encounter (Signed)
Rx's called in as directed.  

## 2012-07-24 ENCOUNTER — Other Ambulatory Visit: Payer: Self-pay | Admitting: Family Medicine

## 2012-07-24 MED ORDER — ESTRADIOL 1 MG PO TABS: 0.5000 mg | ORAL_TABLET | Freq: Every day | ORAL | Status: DC

## 2012-07-24 MED ORDER — FLUOXETINE HCL 40 MG PO CAPS: 40.0000 mg | ORAL_CAPSULE | Freq: Every day | ORAL | Status: DC

## 2012-07-24 MED ORDER — DICLOFENAC SODIUM 75 MG PO TBEC: 75.0000 mg | DELAYED_RELEASE_TABLET | Freq: Two times a day (BID) | ORAL | Status: DC

## 2012-07-30 ENCOUNTER — Other Ambulatory Visit: Payer: Self-pay | Admitting: *Deleted

## 2012-07-30 MED ORDER — ALPRAZOLAM 0.25 MG PO TABS: 0.2500 mg | ORAL_TABLET | Freq: Every evening | ORAL | Status: DC | PRN

## 2012-07-30 MED ORDER — HYDROCODONE-ACETAMINOPHEN 10-325 MG PO TABS
1.0000 | ORAL_TABLET | Freq: Four times a day (QID) | ORAL | Status: DC | PRN
Start: 2012-07-30 — End: 2012-07-30

## 2012-07-30 MED ORDER — ARMODAFINIL 250 MG PO TABS: 250.0000 mg | ORAL_TABLET | Freq: Every day | ORAL | Status: DC

## 2012-07-30 MED ORDER — HYDROCODONE-ACETAMINOPHEN 10-325 MG PO TABS: 1.0000 | ORAL_TABLET | Freq: Four times a day (QID) | ORAL | Status: DC | PRN

## 2012-07-30 NOTE — Telephone Encounter (Signed)
Received fax refill request, please advise 

## 2012-07-30 NOTE — Telephone Encounter (Signed)
Printed and placed in Kim[facility] box. 

## 2012-07-30 NOTE — Telephone Encounter (Signed)
Will need printed scripts and per pharmacy guidelines will need to be faxed to them.

## 2012-07-30 NOTE — Telephone Encounter (Signed)
plz phone in. 

## 2012-07-31 NOTE — Telephone Encounter (Signed)
Rx's sent in as directed.

## 2012-08-24 ENCOUNTER — Other Ambulatory Visit: Payer: Self-pay | Admitting: Family Medicine

## 2012-08-24 DIAGNOSIS — E785 Hyperlipidemia, unspecified: Secondary | ICD-10-CM

## 2012-08-24 DIAGNOSIS — E039 Hypothyroidism, unspecified: Secondary | ICD-10-CM

## 2012-08-24 DIAGNOSIS — D649 Anemia, unspecified: Secondary | ICD-10-CM

## 2012-08-25 ENCOUNTER — Other Ambulatory Visit (INDEPENDENT_AMBULATORY_CARE_PROVIDER_SITE_OTHER): Payer: Medicare Other

## 2012-08-25 DIAGNOSIS — E785 Hyperlipidemia, unspecified: Secondary | ICD-10-CM

## 2012-08-25 DIAGNOSIS — E039 Hypothyroidism, unspecified: Secondary | ICD-10-CM

## 2012-08-25 DIAGNOSIS — D649 Anemia, unspecified: Secondary | ICD-10-CM

## 2012-08-25 LAB — BASIC METABOLIC PANEL
Chloride: 102 mEq/L (ref 96–112)
GFR: 59.65 mL/min — ABNORMAL LOW (ref >60.00)
Potassium: 4.5 mEq/L (ref 3.5–5.1)
Sodium: 136 mEq/L (ref 135–145)

## 2012-08-25 LAB — LIPID PANEL
Cholesterol: 247 mg/dL — ABNORMAL HIGH (ref 0–200)
HDL: 99.2 mg/dL (ref >39.00)
Total CHOL/HDL Ratio: 2
Triglycerides: 172 mg/dL — ABNORMAL HIGH (ref 0.0–149.0)
VLDL: 34.4 mg/dL (ref 0.0–40.0)

## 2012-08-25 LAB — CBC WITH DIFFERENTIAL/PLATELET
Basophils Absolute: 0 10*3/uL (ref 0.0–0.1)
Eosinophils Absolute: 0.1 10*3/uL (ref 0.0–0.7)
HCT: 37.8 % (ref 36.0–46.0)
Hemoglobin: 13.1 g/dL (ref 12.0–15.0)
Lymphocytes Relative: 31.4 % (ref 12.0–46.0)
Lymphs Abs: 1.8 10*3/uL (ref 0.7–4.0)
MCHC: 34.8 g/dL (ref 30.0–36.0)
MCV: 100.4 fl — ABNORMAL HIGH (ref 78.0–100.0)
Monocytes Absolute: 0.6 10*3/uL (ref 0.1–1.0)
Neutro Abs: 3.2 10*3/uL (ref 1.4–7.7)
RDW: 14.6 % (ref 11.5–14.6)

## 2012-08-28 ENCOUNTER — Encounter: Payer: Self-pay | Admitting: Family Medicine

## 2012-08-28 ENCOUNTER — Ambulatory Visit (INDEPENDENT_AMBULATORY_CARE_PROVIDER_SITE_OTHER): Payer: Medicare Other | Admitting: Family Medicine

## 2012-08-28 ENCOUNTER — Telehealth: Payer: Self-pay | Admitting: Family Medicine

## 2012-08-28 VITALS — BP 132/80 | HR 88 | Temp 98.3°F | Ht 64.5 in | Wt 189.5 lb

## 2012-08-28 DIAGNOSIS — Z1331 Encounter for screening for depression: Secondary | ICD-10-CM

## 2012-08-28 DIAGNOSIS — M129 Arthropathy, unspecified: Secondary | ICD-10-CM

## 2012-08-28 DIAGNOSIS — F341 Dysthymic disorder: Secondary | ICD-10-CM

## 2012-08-28 DIAGNOSIS — Z0000 Encounter for general adult medical examination without abnormal findings: Secondary | ICD-10-CM

## 2012-08-28 DIAGNOSIS — K219 Gastro-esophageal reflux disease without esophagitis: Secondary | ICD-10-CM

## 2012-08-28 DIAGNOSIS — G47419 Narcolepsy without cataplexy: Secondary | ICD-10-CM

## 2012-08-28 DIAGNOSIS — E039 Hypothyroidism, unspecified: Secondary | ICD-10-CM

## 2012-08-28 DIAGNOSIS — E785 Hyperlipidemia, unspecified: Secondary | ICD-10-CM

## 2012-08-28 DIAGNOSIS — E8940 Asymptomatic postprocedural ovarian failure: Secondary | ICD-10-CM

## 2012-08-28 DIAGNOSIS — M899 Disorder of bone, unspecified: Secondary | ICD-10-CM

## 2012-08-28 MED ORDER — NORTRIPTYLINE HCL 10 MG PO CAPS
10.0000 mg | ORAL_CAPSULE | Freq: Every day | ORAL | Status: DC
Start: 2012-08-28 — End: 2013-03-19

## 2012-08-28 MED ORDER — DOXYCYCLINE HYCLATE 100 MG PO CAPS: 100.0000 mg | ORAL_CAPSULE | Freq: Two times a day (BID) | ORAL | Status: DC

## 2012-08-28 NOTE — Assessment & Plan Note (Signed)
Dx by prior PCP. Stable on nuvigil.

## 2012-08-28 NOTE — Telephone Encounter (Signed)
Spoke with [facility].  Pt had colonoscopy there in 2005, they will fax records to our office.

## 2012-08-28 NOTE — Assessment & Plan Note (Signed)
>  15 min spent discussing depression and treatment options. Will continue prozac, start nortriptyline adjuvant at 10mg  nightly (hopeful for some pain perception modificaiton). Will refer to counseling. Encouraged participation in silver sneakers.

## 2012-08-28 NOTE — Progress Notes (Signed)
Subjective:    Patient ID: Faith Santiago, female    DOB: May 07, 1940, 72 y.o.   MRN: [id number]  HPI CC: medicare wellness visit  Has had multiple tick bites this season.  Brings 4 ticks in a bag - deer ticks.  Pulled off - after 1 day.  Husband with h/o lyme disease.  She has a good friend who had RMSF.  Did have course of abx for tick bites (06/2011).  Yesterday had tick bites.  No fevers, chills, rash, headaches, abd pain or confusion.    H/o chronic arthritis - hands R>L, neck and lower back.  Preventative:  Usually healthy.  mammo - last 2009, normal  Pelvic - S/p hysterectomy for uterine cancer.  Pap smear WNL last year.  Will repeat next year, consider spacing out. Colonoscopy - ~2009.  normal per pt.  DEXA 2008 - normal.  Pneumovax 2011 Td 2009 [facility] - discussed, decided to decline.   Advanced directives - living will at home. Would want husband to be HCPOA.   Vision - R side difficulty longstanding. [facility] (pending 09/2012.) Hearing - no problems.  No falls in the last year.   Depression - on high dose prozac 80mg  daily.  Has seen counselor for depression - did feel this helped.  Has been considering counseling recently.  Interested in counseling.  Lots of losses in last 2 years.  Planning on starting participating in silver sneakers.  ++ anhedonia, very tearful.  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  Past histories reviewed and updated if relevant as below. Patient Active Problem List   Diagnosis Date Noted  . Postsurgical menopause 08/07/2011  . Medicare annual wellness visit, subsequent 08/06/2011  . ANEMIA 06/07/2009  . ARTHRITIS 06/07/2009  . NARCOLEPSY WITHOUT CATAPLEXY  05/31/2008  . HYPOTHYROIDISM 09/30/2007  . OSTEOPENIA 09/30/2007  . HYPERLIPIDEMIA 06/03/2007  . ANXIETY DEPRESSION 06/03/2007  . GERD 06/03/2007   Past Medical History  Diagnosis Date  . Anxiety   . Arthritis     hands R>L, neck and lower back  . Depression   . Narcolepsy   . Anemia, unspecified   . Esophageal reflux   . Other and unspecified hyperlipidemia   . Osteopenia    Past Surgical History  Procedure Laterality Date  . Vesicovaginal fistula closure w/ tah    . Bilateral catarct removal     History  Substance Use Topics  . Smoking status: Never Smoker   . Smokeless tobacco: Never Used  . Alcohol Use: Yes     Comment: 1 drink /day   Family History  Problem Relation Age of Onset  . Hypothyroidism Mother   . Stroke Mother     ministroke  . Coronary artery disease Mother     CABG  . Cancer Father     prostate  . Diabetes Neg Hx    Allergies  Allergen Reactions  . Ciprofloxacin     REACTION: sun induced rash   Current Outpatient Prescriptions on File Prior to Visit  Medication Sig Dispense Refill  . ALPRAZolam (XANAX) 0.25 MG tablet Take 1 tablet (0.25 mg total) by mouth at bedtime as needed.  30 tablet  0  . Armodafinil (NUVIGIL) 250 MG tablet Take 1  tablet (250 mg total) by mouth daily.  30 tablet  0  . diclofenac (VOLTAREN) 75 MG EC tablet Take 1 tablet (75 mg total) by mouth 2 (two) times daily.  180 tablet  0  . estradiol (ESTRACE) 1 MG tablet Take 0.5 tablets (0.5 mg total) by mouth daily.  45 tablet  0  . HYDROcodone-acetaminophen (NORCO) 10-325 MG per tablet Take 1 tablet by mouth every 6 (six) hours as needed for pain.  120 tablet  0  . aspirin 81 MG EC tablet Take 81 mg by mouth. Three times weekly       No current facility-administered medications on file prior to visit.    Review of Systems  Constitutional: Negative for fever, chills, activity change, appetite change, fatigue and unexpected weight change.  HENT: Positive for rhinorrhea and  postnasal drip. Negative for hearing loss and neck pain.   Eyes: Negative for visual disturbance.  Respiratory: Negative for cough, chest tightness, shortness of breath and wheezing.   Cardiovascular: Negative for chest pain, palpitations and leg swelling.  Gastrointestinal: Negative for nausea, vomiting, abdominal pain, diarrhea, constipation, blood in stool and abdominal distention.  Genitourinary: Negative for hematuria and difficulty urinating.  Musculoskeletal: Negative for myalgias and arthralgias.  Skin: Negative for rash.  Neurological: Negative for dizziness, seizures, syncope and headaches.  Hematological: Negative for adenopathy. Does not bruise/bleed easily.  Psychiatric/Behavioral: Positive for dysphoric mood. The patient is not nervous/anxious.        Objective:   Physical Exam  Nursing note and vitals reviewed. Constitutional: She is oriented to person, place, and time. She appears well-developed and well-nourished. No distress.  HENT:  Head: Normocephalic and atraumatic.  Nose: Nose normal.  Mouth/Throat: Oropharynx is clear and moist. No oropharyngeal exudate.  Eyes: Conjunctivae and EOM are normal. Pupils are equal, round, and reactive to light. No scleral icterus.  Neck: Normal range of motion. Neck supple. No thyromegaly present.  Cardiovascular: Normal rate, regular rhythm, normal heart sounds and intact distal pulses.   No murmur heard. Pulses:      Radial pulses are 2+ on the right side, and 2+ on the left side.  Pulmonary/Chest: Effort normal and breath sounds normal. No respiratory distress. She has no wheezes. She has no rales. Right breast exhibits no inverted nipple, no mass, no nipple discharge, no skin change and no tenderness. Left breast exhibits no inverted nipple, no mass, no nipple discharge, no skin change and no tenderness. Breasts are symmetrical.  Abdominal: Soft. Bowel sounds are normal. She exhibits no distension and no mass. There is no  tenderness. There is no rebound and no guarding.  Musculoskeletal: Normal range of motion. She exhibits no edema.  Lymphadenopathy:    She has no cervical adenopathy.    She has no axillary adenopathy.       Right axillary: No lateral adenopathy present.       Left axillary: No lateral adenopathy present.      Right: No supraclavicular adenopathy present.       Left: No supraclavicular adenopathy present.  Neurological: She is alert and oriented to person, place, and time.  CN grossly intact, station and gait intact  Skin: Skin is warm and dry. No rash noted.  Psychiatric: She has a normal mood and affect. Her behavior is normal. Judgment and thought content normal.      Assessment & Plan:

## 2012-08-28 NOTE — Assessment & Plan Note (Signed)
Actually, last DEXA WNL.

## 2012-08-28 NOTE — Assessment & Plan Note (Signed)
I have personally reviewed the Medicare Annual Wellness questionnaire and have noted 1. The patient's medical and social history 2. Their use of alcohol, tobacco or illicit drugs 3. Their current medications and supplements 4. The patient's functional ability including ADL's, fall risks, home safety risks and hearing or visual impairment. 5. Diet and physical activity 6. Evidence for depression or mood disorders The patients weight, height, BMI have been recorded in the chart.  Hearing and vision has been addressed. I have made referrals, counseling and provided education to the patient based review of the above and I have provided the pt with a written personalized care plan for preventive services. See scanned questionairre. Advanced directives discussed: I've asked her to bring me copy of advanced directive.  Reviewed preventative protocols and updated unless pt declined. She will check with insurance re Td. To schedule mammogram. We will check for latest colonoscopy.

## 2012-08-28 NOTE — Assessment & Plan Note (Signed)
Reviewed #s, encouraged low chol diet. Stable off meds.

## 2012-08-28 NOTE — Assessment & Plan Note (Signed)
Continue NSAID, hydrocodone.

## 2012-08-28 NOTE — Assessment & Plan Note (Signed)
Reviewed check TSH - normal.  Will remove from problem list.

## 2012-08-28 NOTE — Patient Instructions (Addendum)
Watch for fever, new rash, nausea, headache, confusion, or worsening joint pain - if this happens, fill antibiotic and return to see me. We will check for colonoscopy with [facility] to see if done there. Bring me copy of living will to update your chart. Call your insurance about the shingles shot to see if it is covered or how much it would cost and where is cheaper (here or pharmacy).  If you want to receive here, call for nurse visit. Call to schedule mammogram. Pass by [place]' office to schedule appointment with Dr. Laymond Purser. Start nortriptyline nightly for mood and pain - let me know how this is working.

## 2012-08-28 NOTE — Assessment & Plan Note (Signed)
Continue low dose estrogen.

## 2012-08-28 NOTE — Assessment & Plan Note (Signed)
Stable off meds. ?

## 2012-08-31 NOTE — Telephone Encounter (Signed)
Marylu Lund from [facility] called to say that chart states pt had last colonoscopy in 2009 but she can only find documentation in paper chart for colonoscopy in 2005.

## 2012-09-03 ENCOUNTER — Encounter: Payer: Self-pay | Admitting: *Deleted

## 2012-09-03 NOTE — Progress Notes (Signed)
error 

## 2012-09-03 NOTE — Telephone Encounter (Signed)
It appears that note in 2009 was when another colonoscopy was ordered not when one was actually done. Marylu Lund is sending results from 2005 and GI says patient isn't due for repeat until 2015.

## 2012-09-04 ENCOUNTER — Other Ambulatory Visit: Payer: Self-pay | Admitting: *Deleted

## 2012-09-07 ENCOUNTER — Encounter: Payer: Self-pay | Admitting: Family Medicine

## 2012-09-07 NOTE — Telephone Encounter (Signed)
Received report and asked to scan.  Colonoscopy done 2005

## 2012-09-08 MED ORDER — ARMODAFINIL 250 MG PO TABS: 250.0000 mg | ORAL_TABLET | Freq: Every day | ORAL | Status: DC

## 2012-09-08 MED ORDER — HYDROCODONE-ACETAMINOPHEN 10-325 MG PO TABS: 1.0000 | ORAL_TABLET | Freq: Four times a day (QID) | ORAL | Status: DC | PRN

## 2012-09-08 MED ORDER — ALPRAZOLAM 0.25 MG PO TABS: 0.2500 mg | ORAL_TABLET | Freq: Every evening | ORAL | Status: DC | PRN

## 2012-09-08 NOTE — Telephone Encounter (Signed)
Rx's faxed to [facility] 

## 2012-09-08 NOTE — Telephone Encounter (Signed)
Printed and placed in Kim[facility] box. 

## 2012-09-10 ENCOUNTER — Telehealth: Payer: Self-pay

## 2012-09-10 NOTE — Telephone Encounter (Signed)
[facility] said [facility]  Does not have refills for alprazolam, hydrocodone, nuvigil or nortriptylline. Pt to call [facility] to speak with someone [facility]ills should be at [facility]. If [facility] cannot locate refills [facility] should [facility] Patrice Paradise office. Pt voiced understanding.

## 2012-09-11 ENCOUNTER — Encounter: Payer: Self-pay | Admitting: Family Medicine

## 2012-09-11 NOTE — Telephone Encounter (Signed)
Pt spoke with Josh at [facility]; [facility] is processing alprazolam,hydrocodone and nuvigil; [facility] requesting additional info for nortriptyline. Dr Reece Agar completed form for Nortriptyline and faxed back to [phone].pt notified done.

## 2012-09-29 ENCOUNTER — Ambulatory Visit (INDEPENDENT_AMBULATORY_CARE_PROVIDER_SITE_OTHER): Payer: 59 | Admitting: Psychology

## 2012-09-29 DIAGNOSIS — F331 Major depressive disorder, recurrent, moderate: Secondary | ICD-10-CM

## 2012-10-23 ENCOUNTER — Other Ambulatory Visit: Payer: Self-pay

## 2012-10-23 NOTE — Telephone Encounter (Signed)
Pt left v/m requesting refill hydrocodone apap [facility].Please advise.

## 2012-10-25 MED ORDER — HYDROCODONE-ACETAMINOPHEN 10-325 MG PO TABS: 1.0000 | ORAL_TABLET | Freq: Four times a day (QID) | ORAL | Status: DC | PRN

## 2012-10-25 NOTE — Telephone Encounter (Signed)
plz phone in and notify pt. 

## 2012-10-27 ENCOUNTER — Ambulatory Visit (INDEPENDENT_AMBULATORY_CARE_PROVIDER_SITE_OTHER): Payer: 59 | Admitting: Psychology

## 2012-10-27 DIAGNOSIS — F331 Major depressive disorder, recurrent, moderate: Secondary | ICD-10-CM

## 2012-10-27 NOTE — Telephone Encounter (Signed)
Rx called in as directed.   

## 2012-11-27 ENCOUNTER — Other Ambulatory Visit: Payer: Self-pay

## 2012-11-27 ENCOUNTER — Other Ambulatory Visit: Payer: Self-pay | Admitting: Family Medicine

## 2012-11-27 MED ORDER — HYDROCODONE-ACETAMINOPHEN 10-325 MG PO TABS: 1.0000 | ORAL_TABLET | Freq: Four times a day (QID) | ORAL | Status: DC | PRN

## 2012-11-27 MED ORDER — ARMODAFINIL 250 MG PO TABS: 250.0000 mg | ORAL_TABLET | Freq: Every day | ORAL | Status: DC

## 2012-11-27 MED ORDER — ALPRAZOLAM 0.25 MG PO TABS: 0.2500 mg | ORAL_TABLET | Freq: Every evening | ORAL | Status: DC | PRN

## 2012-11-27 NOTE — Telephone Encounter (Signed)
Rx's called in as directed.  

## 2012-11-27 NOTE — Telephone Encounter (Signed)
Pt left v/m requesting refill alprazolam,nuvigil and hydrocodone apap to [facility]. Pt request cb when refilled.

## 2012-11-27 NOTE — Telephone Encounter (Signed)
plz phone in. 

## 2013-02-01 ENCOUNTER — Other Ambulatory Visit: Payer: Self-pay | Admitting: Family Medicine

## 2013-02-02 NOTE — Telephone Encounter (Signed)
Rx's called in as directed.  

## 2013-02-02 NOTE — Telephone Encounter (Signed)
plz phone in. 

## 2013-03-19 ENCOUNTER — Other Ambulatory Visit: Payer: Self-pay

## 2013-03-19 ENCOUNTER — Other Ambulatory Visit: Payer: Self-pay | Admitting: Family Medicine

## 2013-03-19 NOTE — Telephone Encounter (Signed)
Pt request rx hydrocodone apap. Call when ready for pick up. Pt also request Nortriptyline 90 day sent to [facility].Please advise.

## 2013-03-19 NOTE — Telephone Encounter (Signed)
Ok to refill 

## 2013-03-21 MED ORDER — NORTRIPTYLINE HCL 10 MG PO CAPS: 10.0000 mg | ORAL_CAPSULE | Freq: Every day | ORAL | Status: DC

## 2013-03-21 NOTE — Telephone Encounter (Signed)
plz phone in. 

## 2013-03-22 MED ORDER — HYDROCODONE-ACETAMINOPHEN 10-325 MG PO TABS: 1.0000 | ORAL_TABLET | Freq: Four times a day (QID) | ORAL | Status: DC | PRN

## 2013-03-22 NOTE — Telephone Encounter (Signed)
Patient notified and Rx placed up front for pick up. Advised to bring ID.  

## 2013-03-22 NOTE — Telephone Encounter (Signed)
Rx called in as directed.   

## 2013-03-22 NOTE — Telephone Encounter (Signed)
Printed and placed in Kim[facility] box. 

## 2013-04-16 ENCOUNTER — Telehealth: Payer: Self-pay | Admitting: *Deleted

## 2013-04-16 NOTE — Telephone Encounter (Signed)
Received prior auth request from [facility] for Estradiol. Auth paperwork obtained and placed in your inbox.

## 2013-04-19 ENCOUNTER — Other Ambulatory Visit: Payer: Self-pay | Admitting: Family Medicine

## 2013-04-19 NOTE — Telephone Encounter (Signed)
Rx called in as directed.   

## 2013-04-19 NOTE — Telephone Encounter (Signed)
plz phone in. 

## 2013-04-26 NOTE — Telephone Encounter (Signed)
Prior auth paperwork faxed, pending response from insurance company.   

## 2013-04-26 NOTE — Telephone Encounter (Signed)
Filled and sent back to natasha.

## 2013-04-30 NOTE — Telephone Encounter (Signed)
Prior auth denied by insurance. Denial letter placed in your inbox to be signed, then sent to scan.

## 2013-05-03 NOTE — Telephone Encounter (Signed)
Have asked to resubmit. 

## 2013-05-05 NOTE — Telephone Encounter (Signed)
Paperwork resubmitted, pending notification from insurance company. 

## 2013-05-18 NOTE — Telephone Encounter (Signed)
Approval form received by insurance company. Sending to be scanned into patients medical record. Pharmacy notified via fax.

## 2013-06-08 ENCOUNTER — Other Ambulatory Visit: Payer: Self-pay | Admitting: *Deleted

## 2013-06-09 ENCOUNTER — Other Ambulatory Visit: Payer: Self-pay | Admitting: *Deleted

## 2013-06-09 NOTE — Telephone Encounter (Signed)
Ok to refill 

## 2013-06-10 MED ORDER — ALPRAZOLAM 0.25 MG PO TABS: ORAL_TABLET | ORAL | Status: DC

## 2013-06-10 MED ORDER — ARMODAFINIL 250 MG PO TABS: ORAL_TABLET | ORAL | Status: DC

## 2013-06-10 NOTE — Telephone Encounter (Signed)
plz phone in. 

## 2013-06-14 NOTE — Telephone Encounter (Signed)
Rx's called in as directed.  

## 2013-06-16 ENCOUNTER — Encounter: Payer: Self-pay | Admitting: Internal Medicine

## 2013-06-28 ENCOUNTER — Telehealth: Payer: Self-pay | Admitting: *Deleted

## 2013-06-28 NOTE — Telephone Encounter (Signed)
PA for Nuvigil in your IN box for completion.

## 2013-06-29 NOTE — Telephone Encounter (Signed)
Placed in Kim's box. plz check with patient what other drugs have been tried and failed (provigil, stimulant?)

## 2013-06-29 NOTE — Telephone Encounter (Signed)
No other drugs tried and failed. Form faxed. Will await determination.

## 2013-07-19 ENCOUNTER — Ambulatory Visit (INDEPENDENT_AMBULATORY_CARE_PROVIDER_SITE_OTHER): Payer: Medicare Other | Admitting: Internal Medicine

## 2013-07-19 ENCOUNTER — Encounter: Payer: Self-pay | Admitting: Internal Medicine

## 2013-07-19 VITALS — BP 110/60 | HR 108 | Temp 98.2°F | Wt 191.0 lb

## 2013-07-19 DIAGNOSIS — J019 Acute sinusitis, unspecified: Secondary | ICD-10-CM

## 2013-07-19 NOTE — Progress Notes (Signed)
Subjective:    Patient ID: Faith Santiago, female    DOB: [birth year], 73 y.o.   MRN: [id number]  HPI Husband was sick first-- about 10 days  Starting with sore throat--now starting with cough Congestion for 2-3 days No fever Cough is dry---occasional mucus in AM Some nasal drainage---and most post nasal drip  Some pain in ears this AM No headache No SOB  Tried sinus OTC med-- some help  Current Outpatient Prescriptions on File Prior to Visit  Medication Sig Dispense Refill  . ALPRAZolam (XANAX) 0.25 MG tablet TAKE 1 TABLET BY MOUTH AT BEDTIME AS NEEDED  30 tablet  1  . Armodafinil (NUVIGIL) 250 MG tablet TAKE 1 TABLET BY MOUTH EVERY DAY  30 tablet  1  . aspirin 81 MG EC tablet Take 81 mg by mouth. Three times weekly      . diclofenac (VOLTAREN) 75 MG EC tablet Take 1 tablet by mouth two  times daily daily  180 tablet  3  . doxycycline (VIBRAMYCIN) 100 MG capsule Take 1 capsule (100 mg total) by mouth 2 (two) times daily.  20 capsule  0  . estradiol (ESTRACE) 1 MG tablet Take one-half tablet by  mouth daily  45 tablet  3  . fish oil-omega-3 fatty acids 1000 MG capsule Take 1,200 mg by mouth daily.      Marland Kitchen FLUoxetine (PROZAC) 40 MG capsule Take 1 capsule by mouth  daily  90 capsule  3  . HYDROcodone-acetaminophen (NORCO) 10-325 MG per tablet Take 1 tablet by mouth every 6 (six) hours as needed.  120 tablet  0  . Multiple Vitamin (MULTIVITAMIN) tablet Take 1 tablet by mouth daily.      . nortriptyline (PAMELOR) 10 MG capsule Take 1 capsule (10 mg total) by mouth at bedtime.  90 capsule  3   No current facility-administered medications on file prior to visit.    Allergies  Allergen Reactions  . Ciprofloxacin     REACTION: sun induced rash    Past Medical History  Diagnosis Date  . Anxiety   . Arthritis     hands R>L, neck and lower back  . Depression   . Narcolepsy   . Anemia, unspecified   . Esophageal reflux   . HLD (hyperlipidemia)   . Osteopenia     DEXA WNL 2008     Past Surgical History  Procedure Laterality Date  . Vesicovaginal fistula closure w/ tah    . Bilateral catarct removal  2010  . Colonoscopy  05/2003    WNL (Dr Velora Heckler)    Family History  Problem Relation Age of Onset  . Hypothyroidism Mother   . Stroke Mother     ministroke  . Coronary artery disease Mother     CABG  . Cancer Father     prostate  . Diabetes Neg Hx     History   Social History  . Marital Status: Married    Spouse Name: N/A    Number of Children: N/A  . Years of Education: N/A   Occupational History  . Not on file.   Social History Main Topics  . Smoking status: Never Smoker   . Smokeless tobacco: Never Used  . Alcohol Use: Yes     Comment: 1 drink /day  . Drug Use: No  . Sexual Activity: No   Other Topics Concern  . 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 No distress.  HENT:  Mouth/Throat: Oropharynx is clear and moist. No oropharyngeal exudate.  No sinus tenderness TMs normal Mild nasal inflammation  Neck: Normal range of motion. Neck supple. No thyromegaly present.  Pulmonary/Chest: Effort normal and breath sounds normal. No respiratory distress. She has no wheezes. She has no rales.  Lymphadenopathy:    She has no cervical adenopathy.          Assessment & Plan:

## 2013-07-19 NOTE — Assessment & Plan Note (Signed)
Still seems to be viral Discussed supportive care Will consider amoxicillin if worsens later in week

## 2013-07-20 ENCOUNTER — Telehealth: Payer: Self-pay | Admitting: *Deleted

## 2013-07-20 MED ORDER — AMOXICILLIN 500 MG PO TABS: 1000.0000 mg | ORAL_TABLET | Freq: Two times a day (BID) | ORAL | Status: DC

## 2013-07-20 NOTE — Telephone Encounter (Signed)
Pt states she feels worse and would like something called in, she's hoarse, and the cough is worse, please advise

## 2013-07-20 NOTE — Telephone Encounter (Signed)
Spoke with patient and advised results   

## 2013-07-20 NOTE — Telephone Encounter (Signed)
Let her know [facility] [place] [facility]

## 2013-09-02 ENCOUNTER — Other Ambulatory Visit: Payer: Self-pay | Admitting: *Deleted

## 2013-09-02 MED ORDER — ARMODAFINIL 250 MG PO TABS: ORAL_TABLET | ORAL | Status: DC

## 2013-09-02 MED ORDER — ALPRAZOLAM 0.25 MG PO TABS: ORAL_TABLET | ORAL | Status: DC

## 2013-09-02 NOTE — Telephone Encounter (Signed)
plz phone in. 

## 2013-09-02 NOTE — Telephone Encounter (Signed)
Ok to refill 

## 2013-09-03 NOTE — Telephone Encounter (Signed)
Rx's called in as directed.  

## 2013-10-28 ENCOUNTER — Encounter: Payer: Self-pay | Admitting: Internal Medicine

## 2013-11-08 ENCOUNTER — Telehealth: Payer: Self-pay

## 2013-11-08 DIAGNOSIS — S82209A Unspecified fracture of shaft of unspecified tibia, initial encounter for closed fracture: Secondary | ICD-10-CM

## 2013-11-08 DIAGNOSIS — S82409A Unspecified fracture of shaft of unspecified fibula, initial encounter for closed fracture: Principal | ICD-10-CM

## 2013-11-08 NOTE — Telephone Encounter (Signed)
What side was tibia/fibula fractured? Referral placed.

## 2013-11-08 NOTE — Telephone Encounter (Signed)
Left tib/fib notch fracture. In a temporary cast for now. Will await call from [facility].

## 2013-11-08 NOTE — Telephone Encounter (Signed)
Rolin Barry left v/m; pt was in [place] and pt fell and broke tibula and fibula near ankle bone; Pt has xrays on disc. Mr Meller request appt for pt to have surgery on leg. Mr Woolen request that Maudie Mercury cb with appt. Mr and Mrs Convey will return home on 11/10/13.

## 2013-11-10 NOTE — Telephone Encounter (Signed)
Appt made today with Dr Mardelle Matte, patient notified.

## 2013-11-12 ENCOUNTER — Encounter (HOSPITAL_BASED_OUTPATIENT_CLINIC_OR_DEPARTMENT_OTHER): Payer: Self-pay | Admitting: *Deleted

## 2013-11-12 NOTE — Progress Notes (Signed)
No labs needed

## 2013-11-17 ENCOUNTER — Ambulatory Visit: Payer: Self-pay | Admitting: Orthopedic Surgery

## 2013-11-19 ENCOUNTER — Ambulatory Visit (HOSPITAL_BASED_OUTPATIENT_CLINIC_OR_DEPARTMENT_OTHER)
Admission: RE | Admit: 2013-11-19 | Discharge: 2013-11-19 | Disposition: A | Discharge status: Home / Self Care | Payer: Medicare Other | Source: Ambulatory Visit | Attending: Orthopedic Surgery | Admitting: Orthopedic Surgery

## 2013-11-19 ENCOUNTER — Ambulatory Visit (HOSPITAL_BASED_OUTPATIENT_CLINIC_OR_DEPARTMENT_OTHER): Payer: Medicare Other | Admitting: Anesthesiology

## 2013-11-19 ENCOUNTER — Encounter (HOSPITAL_BASED_OUTPATIENT_CLINIC_OR_DEPARTMENT_OTHER): Payer: Self-pay | Admitting: *Deleted

## 2013-11-19 ENCOUNTER — Encounter (HOSPITAL_BASED_OUTPATIENT_CLINIC_OR_DEPARTMENT_OTHER)
Admission: RE | Disposition: A | Discharge status: Home / Self Care | Payer: Self-pay | Source: Ambulatory Visit | Attending: Orthopedic Surgery

## 2013-11-19 ENCOUNTER — Encounter (HOSPITAL_BASED_OUTPATIENT_CLINIC_OR_DEPARTMENT_OTHER): Payer: Medicare Other | Admitting: Anesthesiology

## 2013-11-19 DIAGNOSIS — M19049 Primary osteoarthritis, unspecified hand: Secondary | ICD-10-CM | POA: Diagnosis not present

## 2013-11-19 DIAGNOSIS — M899 Disorder of bone, unspecified: Secondary | ICD-10-CM | POA: Diagnosis not present

## 2013-11-19 DIAGNOSIS — E785 Hyperlipidemia, unspecified: Secondary | ICD-10-CM | POA: Diagnosis not present

## 2013-11-19 DIAGNOSIS — K219 Gastro-esophageal reflux disease without esophagitis: Secondary | ICD-10-CM | POA: Diagnosis not present

## 2013-11-19 DIAGNOSIS — F329 Major depressive disorder, single episode, unspecified: Secondary | ICD-10-CM | POA: Insufficient documentation

## 2013-11-19 DIAGNOSIS — F3289 Other specified depressive episodes: Secondary | ICD-10-CM | POA: Insufficient documentation

## 2013-11-19 DIAGNOSIS — F411 Generalized anxiety disorder: Secondary | ICD-10-CM | POA: Insufficient documentation

## 2013-11-19 DIAGNOSIS — X58XXXA Exposure to other specified factors, initial encounter: Secondary | ICD-10-CM | POA: Diagnosis not present

## 2013-11-19 DIAGNOSIS — Z7982 Long term (current) use of aspirin: Secondary | ICD-10-CM | POA: Diagnosis not present

## 2013-11-19 DIAGNOSIS — S82843A Displaced bimalleolar fracture of unspecified lower leg, initial encounter for closed fracture: Secondary | ICD-10-CM | POA: Insufficient documentation

## 2013-11-19 DIAGNOSIS — M949 Disorder of cartilage, unspecified: Secondary | ICD-10-CM | POA: Diagnosis not present

## 2013-11-19 DIAGNOSIS — S82842A Displaced bimalleolar fracture of left lower leg, initial encounter for closed fracture: Secondary | ICD-10-CM

## 2013-11-19 DIAGNOSIS — Z791 Long term (current) use of non-steroidal anti-inflammatories (NSAID): Secondary | ICD-10-CM | POA: Diagnosis not present

## 2013-11-19 HISTORY — DX: Displaced bimalleolar fracture of left lower leg, initial encounter for closed fracture: S82.842A

## 2013-11-19 HISTORY — PX: ORIF ANKLE FRACTURE: SHX5408

## 2013-11-19 SURGERY — OPEN REDUCTION INTERNAL FIXATION (ORIF) ANKLE FRACTURE
Anesthesia: Regional | Site: Ankle | Laterality: Left

## 2013-11-19 MED ORDER — OXYCODONE HCL 5 MG PO TABS: 5.0000 mg | ORAL_TABLET | Freq: Once | ORAL | Status: DC | PRN

## 2013-11-19 MED ORDER — ONDANSETRON HCL 4 MG/2ML IJ SOLN: 4.0000 mg | Freq: Once | INTRAMUSCULAR | Status: DC | PRN

## 2013-11-19 MED ORDER — LACTATED RINGERS IV SOLN
INTRAVENOUS | Status: DC
  Administered 2013-11-19 (×2): via INTRAVENOUS

## 2013-11-19 MED ORDER — LIDOCAINE HCL (CARDIAC) 20 MG/ML IV SOLN
INTRAVENOUS | Status: DC | PRN
  Administered 2013-11-19: 100 mg via INTRAVENOUS

## 2013-11-19 MED ORDER — OXYCODONE HCL 5 MG/5ML PO SOLN: 5.0000 mg | Freq: Once | ORAL | Status: DC | PRN

## 2013-11-19 MED ORDER — PROPOFOL 10 MG/ML IV BOLUS
INTRAVENOUS | Status: AC
Start: 2013-11-19 — End: 2013-11-19
  Filled 2013-11-19: qty 20

## 2013-11-19 MED ORDER — PROPOFOL 10 MG/ML IV BOLUS
INTRAVENOUS | Status: DC | PRN
  Administered 2013-11-19: 50 mg via INTRAVENOUS
  Administered 2013-11-19: 150 mg via INTRAVENOUS

## 2013-11-19 MED ORDER — DEXAMETHASONE SODIUM PHOSPHATE 10 MG/ML IJ SOLN
INTRAMUSCULAR | Status: DC | PRN
  Administered 2013-11-19: 10 mg via INTRAVENOUS

## 2013-11-19 MED ORDER — MIDAZOLAM HCL 2 MG/2ML IJ SOLN
1.0000 mg | INTRAMUSCULAR | Status: DC | PRN
  Administered 2013-11-19: 2 mg via INTRAVENOUS

## 2013-11-19 MED ORDER — CEFAZOLIN SODIUM-DEXTROSE 2-3 GM-% IV SOLR
2.0000 g | INTRAVENOUS | Status: AC
  Administered 2013-11-19: 2 g via INTRAVENOUS

## 2013-11-19 MED ORDER — SENNA-DOCUSATE SODIUM 8.6-50 MG PO TABS: 2.0000 | ORAL_TABLET | Freq: Every day | ORAL | Status: DC

## 2013-11-19 MED ORDER — FENTANYL CITRATE 0.05 MG/ML IJ SOLN
INTRAMUSCULAR | Status: AC
  Filled 2013-11-19: qty 4

## 2013-11-19 MED ORDER — ROPIVACAINE HCL 5 MG/ML IJ SOLN
INTRAMUSCULAR | Status: DC | PRN
  Administered 2013-11-19 (×2): 10 mL via PERINEURAL

## 2013-11-19 MED ORDER — MIDAZOLAM HCL 2 MG/2ML IJ SOLN
INTRAMUSCULAR | Status: AC
Start: 2013-11-19 — End: 2013-11-19
  Filled 2013-11-19: qty 2

## 2013-11-19 MED ORDER — MIDAZOLAM HCL 2 MG/2ML IJ SOLN
INTRAMUSCULAR | Status: AC
  Filled 2013-11-19: qty 2

## 2013-11-19 MED ORDER — OXYCODONE-ACETAMINOPHEN 5-325 MG PO TABS: 1.0000 | ORAL_TABLET | Freq: Four times a day (QID) | ORAL | Status: DC | PRN

## 2013-11-19 MED ORDER — HYDROMORPHONE HCL PF 1 MG/ML IJ SOLN
0.2500 mg | INTRAMUSCULAR | Status: DC | PRN
  Administered 2013-11-19 (×2): 0.5 mg via INTRAVENOUS

## 2013-11-19 MED ORDER — FENTANYL CITRATE 0.05 MG/ML IJ SOLN
INTRAMUSCULAR | Status: DC | PRN
  Administered 2013-11-19: 25 ug via INTRAVENOUS
  Administered 2013-11-19: 50 ug via INTRAVENOUS

## 2013-11-19 MED ORDER — BUPIVACAINE-EPINEPHRINE (PF) 0.25% -1:200000 IJ SOLN
INTRAMUSCULAR | Status: DC | PRN
  Administered 2013-11-19 (×2): 10 mL via PERINEURAL

## 2013-11-19 MED ORDER — HYDROMORPHONE HCL PF 1 MG/ML IJ SOLN
INTRAMUSCULAR | Status: AC
  Filled 2013-11-19: qty 1

## 2013-11-19 MED ORDER — CEFAZOLIN SODIUM-DEXTROSE 2-3 GM-% IV SOLR
INTRAVENOUS | Status: AC
  Filled 2013-11-19: qty 50

## 2013-11-19 MED ORDER — FENTANYL CITRATE 0.05 MG/ML IJ SOLN
50.0000 ug | INTRAMUSCULAR | Status: DC | PRN
  Administered 2013-11-19: 100 ug via INTRAVENOUS

## 2013-11-19 MED ORDER — FENTANYL CITRATE 0.05 MG/ML IJ SOLN
INTRAMUSCULAR | Status: AC
  Filled 2013-11-19: qty 2

## 2013-11-19 MED ORDER — ONDANSETRON HCL 4 MG/2ML IJ SOLN
INTRAMUSCULAR | Status: DC | PRN
  Administered 2013-11-19: 4 mg via INTRAVENOUS

## 2013-11-19 MED ORDER — BUPIVACAINE HCL (PF) 0.25 % IJ SOLN
INTRAMUSCULAR | Status: DC | PRN
  Administered 2013-11-19: 10 mL

## 2013-11-19 MED ORDER — ONDANSETRON HCL 4 MG PO TABS: 4.0000 mg | ORAL_TABLET | Freq: Three times a day (TID) | ORAL | Status: DC | PRN

## 2013-11-19 SURGICAL SUPPLY — 86 items
APL SKNCLS STERI-STRIP NONHPOA (GAUZE/BANDAGES/DRESSINGS) ×1
BANDAGE ELASTIC 4 VELCRO ST LF (GAUZE/BANDAGES/DRESSINGS) ×3 IMPLANT
BANDAGE ELASTIC 6 VELCRO ST LF (GAUZE/BANDAGES/DRESSINGS) ×3 IMPLANT
BANDAGE ESMARK 6X9 LF (GAUZE/BANDAGES/DRESSINGS) ×1 IMPLANT
BENZOIN TINCTURE PRP APPL 2/3 (GAUZE/BANDAGES/DRESSINGS) ×2 IMPLANT
BIT DRILL 2.5X110 QC LCP DISP (BIT) ×2 IMPLANT
BIT DRILL CANN 2.7X625 NONSTRL (BIT) ×2 IMPLANT
BIT DRILL QC 3.5X110 (BIT) ×2 IMPLANT
BLADE SURG 15 STRL LF DISP TIS (BLADE) ×3 IMPLANT
BLADE SURG 15 STRL SS (BLADE) ×6
BNDG CMPR 9X6 STRL LF SNTH (GAUZE/BANDAGES/DRESSINGS) ×1
BNDG COHESIVE 4X5 TAN STRL (GAUZE/BANDAGES/DRESSINGS) ×3 IMPLANT
BNDG ESMARK 6X9 LF (GAUZE/BANDAGES/DRESSINGS) ×3
CANISTER SUCT 1200ML W/VALVE (MISCELLANEOUS) ×3 IMPLANT
CLOSURE WOUND 1/2 X4 (GAUZE/BANDAGES/DRESSINGS) ×1
COVER TABLE BACK 60X90 (DRAPES) ×3 IMPLANT
CUFF TOURNIQUET SINGLE 34IN LL (TOURNIQUET CUFF) ×2 IMPLANT
DECANTER SPIKE VIAL GLASS SM (MISCELLANEOUS) IMPLANT
DRAPE C-ARM 42X72 X-RAY (DRAPES) IMPLANT
DRAPE C-ARMOR (DRAPES) IMPLANT
DRAPE EXTREMITY T 121X128X90 (DRAPE) ×3 IMPLANT
DRAPE INCISE IOBAN 66X45 STRL (DRAPES) ×3 IMPLANT
DRAPE OEC MINIVIEW 54X84 (DRAPES) ×2 IMPLANT
DRAPE U 20/CS (DRAPES) ×3 IMPLANT
DRAPE U-SHAPE 47X51 STRL (DRAPES) ×3 IMPLANT
DRSG PAD ABDOMINAL 8X10 ST (GAUZE/BANDAGES/DRESSINGS) ×3 IMPLANT
DURAPREP 26ML APPLICATOR (WOUND CARE) ×3 IMPLANT
ELECT REM PT RETURN 9FT ADLT (ELECTROSURGICAL) ×3
ELECTRODE REM PT RTRN 9FT ADLT (ELECTROSURGICAL) ×1 IMPLANT
GAUZE SPONGE 4X4 12PLY STRL (GAUZE/BANDAGES/DRESSINGS) ×3 IMPLANT
GLOVE BIO SURGEON STRL SZ 6.5 (GLOVE) ×1 IMPLANT
GLOVE BIO SURGEON STRL SZ8 (GLOVE) ×3 IMPLANT
GLOVE BIO SURGEONS STRL SZ 6.5 (GLOVE) ×1
GLOVE BIOGEL PI IND STRL 7.0 (GLOVE) IMPLANT
GLOVE BIOGEL PI IND STRL 8 (GLOVE) ×2 IMPLANT
GLOVE BIOGEL PI INDICATOR 7.0 (GLOVE) ×2
GLOVE BIOGEL PI INDICATOR 8 (GLOVE) ×4
GLOVE ORTHO TXT STRL SZ7.5 (GLOVE) ×3 IMPLANT
GOWN STRL REUS W/ TWL LRG LVL3 (GOWN DISPOSABLE) ×1 IMPLANT
GOWN STRL REUS W/ TWL XL LVL3 (GOWN DISPOSABLE) ×2 IMPLANT
GOWN STRL REUS W/TWL LRG LVL3 (GOWN DISPOSABLE) ×3
GOWN STRL REUS W/TWL XL LVL3 (GOWN DISPOSABLE) ×6
GUIDEWARE NON THREAD 1.25X150 (WIRE) ×6
GUIDEWIRE NON THREAD 1.25X150 (WIRE) IMPLANT
NDL HYPO 25X1 1.5 SAFETY (NEEDLE) IMPLANT
NEEDLE HYPO 25X1 1.5 SAFETY (NEEDLE) ×3 IMPLANT
NS IRRIG 1000ML POUR BTL (IV SOLUTION) ×3 IMPLANT
PACK BASIN DAY SURGERY FS (CUSTOM PROCEDURE TRAY) ×3 IMPLANT
PAD CAST 4YDX4 CTTN HI CHSV (CAST SUPPLIES) ×2 IMPLANT
PADDING CAST COTTON 4X4 STRL (CAST SUPPLIES) ×6
PENCIL BUTTON HOLSTER BLD 10FT (ELECTRODE) ×3 IMPLANT
PLATE LCP 3.5 1/3 TUB 6HX69 (Plate) ×2 IMPLANT
SCREW CANC FT ST SFS 4X16 (Screw) ×2 IMPLANT
SCREW CANC FT/18 4.0 (Screw) ×2 IMPLANT
SCREW CORTEX 3.5 14MM (Screw) ×2 IMPLANT
SCREW CORTEX 3.5 16MM (Screw) ×2 IMPLANT
SCREW CORTEX 3.5 18MM (Screw) ×2 IMPLANT
SCREW CORTEX 3.5 22MM (Screw) ×2 IMPLANT
SCREW LOCK CORT ST 3.5X14 (Screw) IMPLANT
SCREW LOCK CORT ST 3.5X16 (Screw) IMPLANT
SCREW LOCK CORT ST 3.5X18 (Screw) IMPLANT
SCREW LOCK CORT ST 3.5X22 (Screw) IMPLANT
SCREW SHORT THREAD 4.0X40 (Screw) ×4 IMPLANT
SHEET MEDIUM DRAPE 40X70 STRL (DRAPES) IMPLANT
SLEEVE SCD COMPRESS KNEE MED (MISCELLANEOUS) ×3 IMPLANT
SPLINT FAST PLASTER 5X30 (CAST SUPPLIES) ×40
SPLINT PLASTER CAST FAST 5X30 (CAST SUPPLIES) IMPLANT
SPONGE LAP 4X18 X RAY DECT (DISPOSABLE) ×3 IMPLANT
STAPLER VISISTAT 35W (STAPLE) IMPLANT
STRIP CLOSURE SKIN 1/2X4 (GAUZE/BANDAGES/DRESSINGS) ×1 IMPLANT
SUCTION FRAZIER TIP 10 FR DISP (SUCTIONS) ×3 IMPLANT
SUT ETHILON 3 0 PS 1 (SUTURE) IMPLANT
SUT ETHILON 4 0 PS 2 18 (SUTURE) IMPLANT
SUT MNCRL AB 4-0 PS2 18 (SUTURE) IMPLANT
SUT VIC AB 0 CT1 27 (SUTURE) ×6
SUT VIC AB 0 CT1 27XBRD ANBCTR (SUTURE) IMPLANT
SUT VIC AB 2-0 SH 18 (SUTURE) IMPLANT
SUT VIC AB 3-0 SH 27 (SUTURE) ×3
SUT VIC AB 3-0 SH 27X BRD (SUTURE) IMPLANT
SUT VICRYL 3-0 CR8 SH (SUTURE) IMPLANT
SYR BULB 3OZ (MISCELLANEOUS) ×3 IMPLANT
SYR CONTROL 10ML LL (SYRINGE) ×2 IMPLANT
TUBE CONNECTING 20'X1/4 (TUBING) ×1
TUBE CONNECTING 20X1/4 (TUBING) ×2 IMPLANT
UNDERPAD 30X30 INCONTINENT (UNDERPADS AND DIAPERS) ×3 IMPLANT
YANKAUER SUCT BULB TIP NO VENT (SUCTIONS) ×3 IMPLANT

## 2013-11-19 NOTE — Transfer of Care (Signed)
Immediate Anesthesia Transfer of Care Note  Patient: Faith Santiago  Procedure(s) Performed: Procedure(s): LEFT ANKLE FRACTURE OPEN TREATMENT BIMALLEOLAR ANKLE INCLUDES INTERNAL FIXATION  (Left)  Patient Location: PACU  Anesthesia Type:General and Regional  Level of Consciousness: awake, alert  and oriented  Airway & Oxygen Therapy: Patient Spontanous Breathing and Patient connected to face mask oxygen  Post-op Assessment: Report given to PACU RN and Post -op Vital signs reviewed and stable  Post vital signs: Reviewed and stable  Complications: No apparent anesthesia complications

## 2013-11-19 NOTE — Progress Notes (Signed)
Assisted Dr. Crews with left, ultrasound guided, popliteal/saphenous block. Side rails up, monitors on throughout procedure. See vital signs in flow sheet. Tolerated Procedure well. 

## 2013-11-19 NOTE — Anesthesia Preprocedure Evaluation (Signed)
Anesthesia Evaluation  Patient identified by MRN, date of birth, ID band Patient awake    Reviewed: Allergy & Precautions, H&P , NPO status , Patient's Chart, lab work & pertinent test results  Airway Mallampati: I TM Distance: >3 FB     Dental  (+) Teeth Intact, Dental Advisory Given   Pulmonary  breath sounds clear to auscultation        Cardiovascular Rhythm:Regular     Neuro/Psych    GI/Hepatic GERD-  Medicated and Controlled,  Endo/Other    Renal/GU      Musculoskeletal   Abdominal   Peds  Hematology   Anesthesia Other Findings   Reproductive/Obstetrics                           Anesthesia Physical Anesthesia Plan  ASA: II  Anesthesia Plan: General   Post-op Pain Management:    Induction: Intravenous  Airway Management Planned: LMA  Additional Equipment:   Intra-op Plan:   Post-operative Plan: Extubation in OR  Informed Consent: I have reviewed the patients History and Physical, chart, labs and discussed the procedure including the risks, benefits and alternatives for the proposed anesthesia with the patient or authorized representative who has indicated his/her understanding and acceptance.   Dental advisory given  Plan Discussed with: CRNA and Anesthesiologist  Anesthesia Plan Comments:         Anesthesia Quick Evaluation

## 2013-11-19 NOTE — Anesthesia Procedure Notes (Addendum)
Procedure Name: LMA Insertion Date/Time: 11/19/2013 10:24 AM Performed by: Maryella Shivers Pre-anesthesia Checklist: Patient identified, Emergency Drugs available, Suction available and Patient being monitored Patient Re-evaluated:Patient Re-evaluated prior to inductionOxygen Delivery Method: Circle System Utilized Preoxygenation: Pre-oxygenation with 100% oxygen Intubation Type: IV induction Ventilation: Mask ventilation without difficulty LMA: LMA inserted LMA Size: 4.0 Number of attempts: 1 Airway Equipment and Method: bite block Placement Confirmation: positive ETCO2 Tube secured with: Tape Dental Injury: Teeth and Oropharynx as per pre-operative assessment     Anesthesia Regional Block:  Popliteal block  Pre-Anesthetic Checklist: ,, timeout performed, Correct Patient, Correct Site, Correct Laterality, Correct Procedure, Correct Position, site marked, Risks and benefits discussed,  Surgical consent,  Pre-op evaluation,  At surgeon's request and post-op pain management  Laterality: Left and Lower  Prep: chloraprep       Needles:  Injection technique: Single-shot  Needle Type: Echogenic Needle     Needle Length: 9cm 9 cm Needle Gauge: 21 and 21 G    Additional Needles:  Procedures: ultrasound guided (picture in chart) Popliteal block Narrative:  Start time: 11/19/2013 10:00 AM End time: 11/19/2013 10:05 AM Injection made incrementally with aspirations every 5 mL.  Performed by: Personally  Anesthesiologist: Lorrene Reid, MD   Anesthesia Regional Block:  Adductor canal block  Pre-Anesthetic Checklist: ,, timeout performed, Correct Patient, Correct Site, Correct Laterality, Correct Procedure, Correct Position, site marked, Risks and benefits discussed,  Surgical consent,  Pre-op evaluation,  At surgeon's request and post-op pain management  Laterality: Left and Lower  Prep: chloraprep       Needles:  Injection technique: Single-shot  Needle Type: Echogenic Needle      Needle Length: 9cm 9 cm Needle Gauge: 21 and 21 G    Additional Needles:  Procedures: ultrasound guided (picture in chart) Adductor canal block Narrative:  Start time: 11/19/2013 10:10 AM End time: 11/19/2013 10:15 AM Injection made incrementally with aspirations every 5 mL.  Performed by: Personally  Anesthesiologist: Lorrene Reid, MD  Additional Notes: Printer non functional

## 2013-11-19 NOTE — Op Note (Signed)
11/19/2013  PATIENT:  Faith Santiago    PRE-OPERATIVE DIAGNOSIS:  LEFT ANKLE BIMALLEOLAR FRACTURE-CLOSED   POST-OPERATIVE DIAGNOSIS:  Same  PROCEDURE:  LEFT ANKLE FRACTURE OPEN TREATMENT BIMALLEOLAR ANKLE INCLUDES INTERNAL FIXATION   SURGEON:  Johnny Bridge, MD  PHYSICIAN ASSISTANT: Joya Gaskins, OPA-C, present and scrubbed throughout the case, critical for completion in a timely fashion, and for retraction, instrumentation, and closure.  Second assistant: Concha Norway, PA Student  ANESTHESIA:   General  PREOPERATIVE INDICATIONS:  Faith Santiago is a  73 y.o. female with a diagnosis of LEFT ANKLE BIMALLEOLAR FRACTURE-CLOSED  who elected for surgical management to minimize the risk for malunion and nonunion and post-traumatic arthritis.    The risks benefits and alternatives were discussed with the patient preoperatively including but not limited to the risks of infection, bleeding, nerve injury, cardiopulmonary complications, the need for revision surgery, the need for hardware removal, among others, and the patient was willing to proceed.  OPERATIVE IMPLANTS: 1/3 tubular plate, with a single interfragmentary lag screw, and two 4.0 mm cannulated screws for the medial malleolus.  OPERATIVE PROCEDURE: The patient was brought to the operating room and placed in the supine position. All bony prominences were padded. General anesthesia was administered. The lower extremity was prepped and draped in the usual sterile fashion. The leg was elevated and exsanguinated and the tourniquet was inflated. Time out was performed.   Incision was made over the distal fibula and the fracture was exposed and reduced anatomically with a clamp. A lag screw was placed. I then applied a 1/3 tubular locking plate and secured it proximally and distally with non-locking screws. Bone quality was fair. I used c-arm to confirm satisfactory reduction and fixation.   I then turned my attention to the medial malleolus.  Incision was made over the medial malleolus and the fracture exposed and held provisionally with a clamp. 2 guidepins were placed for the 4.0 mm cannulated screws and then confirmation of reduction was made with fluoroscopy. I then placed 2  32mm screws which had satisfactory fixation.   The syndesmosis was stressed using live fluoroscopy and found to be stable.   The wounds were irrigated, and closed with vicryl with routine closure for the skin. The wounds were injected with local anesthetic. Sterile gauze was applied followed by a posterior splint. She was awakened and returned to the PACU in stable and satisfactory condition. There were no complications.

## 2013-11-19 NOTE — H&P (Signed)
PREOPERATIVE H&P  Chief Complaint: LEFT ANKLE BIMALLEOLAR FRACTURE-CLOSED   HPI: Faith Santiago is a 73 y.o. female who presents for preoperative history and physical with a diagnosis of LEFT ANKLE BIMALLEOLAR FRACTURE-CLOSED . Symptoms are rated as moderate to severe, and have been worsening.  This is significantly impairing activities of daily living.  She has elected for surgical management. This occurred a little bit over a week ago, she was in [place], and had significant swelling in the office, after a drive back home from [place], and so we delayed surgery until the swelling subsides.  Past Medical History  Diagnosis Date  . Anxiety   . Arthritis     hands R>L, neck and lower back  . Depression   . Narcolepsy   . Anemia, unspecified   . Esophageal reflux   . HLD (hyperlipidemia)   . Osteopenia     DEXA WNL 2008   Past Surgical History  Procedure Laterality Date  . Vesicovaginal fistula closure w/ tah    . Bilateral catarct removal  2010  . Colonoscopy  05/2003    WNL (Dr Velora Heckler)  . Abdominal hysterectomy     History   Social History  . Marital Status: Married    Spouse Name: N/A    Number of Children: N/A  . Years of Education: N/A   Social History Main Topics  . Smoking status: Never Smoker   . Smokeless tobacco: Never Used  . Alcohol Use: Yes     Comment: 1 drink /day  . Drug Use: No  . Sexual Activity: No   Other Topics Concern  . 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 Hypothyroidism Mother   . Stroke Mother     ministroke  . Coronary artery disease Mother     CABG  . Cancer Father     prostate  . Diabetes Neg Hx    Allergies  Allergen Reactions  . Ciprofloxacin      REACTION: sun induced rash   Prior to Admission medications   Medication Sig Start Date End Date Taking? Authorizing Provider  ALPRAZolam Duanne Moron) 0.25 MG tablet TAKE 1 TABLET BY MOUTH AT BEDTIME AS NEEDED 09/02/13   [name], MD  Armodafinil (NUVIGIL) 250 MG tablet TAKE 1 TABLET BY MOUTH EVERY DAY 09/02/13   [name], MD  aspirin 81 MG EC tablet Take 81 mg by mouth. Three times weekly    Historical Provider, MD  diclofenac (VOLTAREN) 75 MG EC tablet Take 1 tablet by mouth two  times daily daily 11/27/12   [name], MD  estradiol (ESTRACE) 1 MG tablet Take one-half tablet by  mouth daily 11/27/12   [name], MD  fish oil-omega-3 fatty acids 1000 MG capsule Take 1,200 mg by mouth daily.    Historical Provider, MD  FLUoxetine (PROZAC) 40 MG capsule Take 1 capsule by mouth  daily 11/27/12   [name], MD  HYDROcodone-acetaminophen [facility]) 10-325 MG per tablet Take 1 tablet by mouth every 6 (six) hours as needed. 03/19/13   [name], MD  Multiple Vitamin (MULTIVITAMIN) tablet Take 1 tablet by mouth daily.    Historical Provider, MD  nortriptyline (PAMELOR) 10 MG  capsule Take 1 capsule (10 mg total) by mouth at bedtime. 03/19/13   [name], MD     Positive ROS: All other systems have been reviewed and were otherwise negative with the exception of those mentioned in the HPI and as above.  Physical Exam: General: Alert, no acute distress Cardiovascular: No pedal edema Respiratory: No cyanosis, no use of accessory musculature GI: No organomegaly, abdomen is soft and non-tender Skin: No lesions in the area of chief complaint Neurologic: Sensation intact distally Psychiatric: Patient is competent for consent with normal mood and affect Lymphatic: No axillary or cervical lymphadenopathy  MUSCULOSKELETAL: Left foot has sensation intact throughout, good capillary refill, positive pain to palpation medially and laterally.  Assessment: LEFT ANKLE  BIMALLEOLAR FRACTURE-CLOSED   Plan: Plan for Procedure(s): LEFT ANKLE FRACTURE OPEN TREATMENT BIMALLEOLAR ANKLE INCLUDES INTERNAL FIXATION   The risks benefits and alternatives were discussed with the patient including but not limited to the risks of nonoperative treatment, versus surgical intervention including infection, bleeding, nerve injury,  blood clots, cardiopulmonary complications, morbidity, mortality, among others, and they were willing to proceed.   Johnny Bridge, MD Cell [phone]   11/19/2013 8:25 AM

## 2013-11-19 NOTE — Anesthesia Postprocedure Evaluation (Signed)
  Anesthesia Post-op Note  Patient: Faith Santiago  Procedure(s) Performed: Procedure(s): LEFT ANKLE FRACTURE OPEN TREATMENT BIMALLEOLAR ANKLE INCLUDES INTERNAL FIXATION  (Left)  Patient Location: PACU  Anesthesia Type: General, Regional   Level of Consciousness: awake, alert  and oriented  Airway and Oxygen Therapy: Patient Spontanous Breathing  Post-op Pain: mild  Post-op Assessment: Post-op Vital signs reviewed  Post-op Vital Signs: Reviewed  Last Vitals:  Filed Vitals:   11/19/13 1345  BP: 135/77  Pulse: 67  Temp:   Resp: 10    Complications: No apparent anesthesia complications

## 2013-11-19 NOTE — Discharge Instructions (Signed)
Diet: As you were doing prior to hospitalization   Shower:  May shower but keep the wounds dry, use an occlusive plastic wrap, NO SOAKING IN TUB.    There are sticky tapes (steri-strips) on your wounds and all the stitches are absorbable.  Leave the steri-strips in place when changing your dressings, they will peel off with time, usually 2-3 weeks.  Activity:  Increase activity slowly as tolerated, but follow the weight bearing instructions below.  No lifting or driving for 6 weeks.  Weight Bearing:   Non weight bearing  To prevent constipation: you may use a stool softener such as -  Colace (over the counter) 100 mg by mouth twice a day  Drink plenty of fluids (prune juice may be helpful) and high fiber foods Miralax (over the counter) for constipation as needed.    Itching:  If you experience itching with your medications, try taking only a single pain pill, or even half a pain pill at a time.  You may take up to 10 pain pills per day, and you can also use benadryl over the counter for itching or also to help with sleep.   Precautions:  If you experience chest pain or shortness of breath - call 911 immediately for transfer to the hospital emergency department!!  If you develop a fever greater that 101 F, purulent drainage from wound, increased redness or drainage from wound, or calf pain -- Call the office at [phone]                                                Follow- Up Appointment:  Please call for an appointment to be seen in 2 weeks Katherine - [phone]   Regional Anesthesia Blocks  1. Numbness or the inability to move the "blocked" extremity may last from 3-48 hours after placement. The length of time depends on the medication injected and your individual response to the medication. If the numbness is not going away after 48 hours, call your surgeon.  2. The extremity that is blocked will need to be protected until the numbness is gone and the  Strength has returned.  Because you cannot feel it, you will need to take extra care to avoid injury. Because it may be weak, you may have difficulty moving it or using it. You may not know what position it is in without looking at it while the block is in effect.  3. For blocks in the legs and feet, returning to weight bearing and walking needs to be done carefully. You will need to wait until the numbness is entirely gone and the strength has returned. You should be able to move your leg and foot normally before you try and bear weight or walk. You will need someone to be with you when you first try to ensure you do not fall and possibly risk injury.  4. Bruising and tenderness at the needle site are common side effects and will resolve in a few days.  5. Persistent numbness or new problems with movement should be communicated to the surgeon or the [facility] [phone] [place] [phone]).   Post Anesthesia Home Care Instructions  Activity: Get plenty of rest for the remainder of the day. A responsible adult should stay with you for 24 hours following the procedure.  For the next  24 hours, DO NOT: -Drive a car -Paediatric nurse -Drink alcoholic beverages -Take any medication unless instructed by your physician -Make any legal decisions or sign important papers.  Meals: Start with liquid foods such as gelatin or soup. Progress to regular foods as tolerated. Avoid greasy, spicy, heavy foods. If nausea and/or vomiting occur, drink only clear liquids until the nausea and/or vomiting subsides. Call your physician if vomiting continues.  Special Instructions/Symptoms: Your throat may feel dry or sore from the anesthesia or the breathing tube placed in your throat during surgery. If this causes discomfort, gargle with warm salt water. The discomfort should disappear within 24 hours.

## 2013-11-22 ENCOUNTER — Other Ambulatory Visit: Payer: Self-pay | Admitting: Family Medicine

## 2013-11-22 ENCOUNTER — Encounter (HOSPITAL_BASED_OUTPATIENT_CLINIC_OR_DEPARTMENT_OTHER): Payer: Self-pay | Admitting: Orthopedic Surgery

## 2013-11-22 NOTE — Telephone Encounter (Signed)
Fax refill request please advise

## 2013-11-23 MED ORDER — ARMODAFINIL 250 MG PO TABS: ORAL_TABLET | ORAL | Status: DC

## 2013-11-23 MED ORDER — ALPRAZOLAM 0.25 MG PO TABS: ORAL_TABLET | ORAL | Status: DC

## 2013-11-23 NOTE — Telephone Encounter (Signed)
Rx's faxed as directed.

## 2013-11-23 NOTE — Telephone Encounter (Signed)
plz fax back. Printed and placed in [facility]' box.

## 2013-11-26 ENCOUNTER — Encounter: Payer: Self-pay | Admitting: Family Medicine

## 2013-12-03 ENCOUNTER — Encounter: Payer: Self-pay | Admitting: Family Medicine

## 2014-01-12 ENCOUNTER — Ambulatory Visit (INDEPENDENT_AMBULATORY_CARE_PROVIDER_SITE_OTHER): Payer: Medicare Other

## 2014-01-12 DIAGNOSIS — Z23 Encounter for immunization: Secondary | ICD-10-CM

## 2014-02-25 ENCOUNTER — Telehealth (HOSPITAL_COMMUNITY): Payer: Self-pay | Admitting: *Deleted

## 2014-02-25 NOTE — Telephone Encounter (Signed)
Spoke to pt's husband, with instructions to let pt call back to set-up an appt. for AWV. ( Pt will need Breast Ca screening & Colon ca screening)

## 2014-03-01 NOTE — Telephone Encounter (Signed)
LVM for pt to call back  RE: AWV scheduled for 2015

## 2014-03-07 ENCOUNTER — Other Ambulatory Visit: Payer: Self-pay | Admitting: Family Medicine

## 2014-03-07 DIAGNOSIS — Z9189 Other specified personal risk factors, not elsewhere classified: Secondary | ICD-10-CM

## 2014-03-14 ENCOUNTER — Ambulatory Visit (INDEPENDENT_AMBULATORY_CARE_PROVIDER_SITE_OTHER)
Admission: RE | Admit: 2014-03-14 | Discharge: 2014-03-14 | Disposition: A | Discharge status: Home / Self Care | Payer: Medicare Other | Source: Ambulatory Visit | Attending: Family Medicine | Admitting: Family Medicine

## 2014-03-14 DIAGNOSIS — Z9189 Other specified personal risk factors, not elsewhere classified: Secondary | ICD-10-CM

## 2014-03-14 DIAGNOSIS — M859 Disorder of bone density and structure, unspecified: Secondary | ICD-10-CM

## 2014-03-15 ENCOUNTER — Encounter: Payer: Self-pay | Admitting: Family Medicine

## 2014-03-15 ENCOUNTER — Ambulatory Visit (INDEPENDENT_AMBULATORY_CARE_PROVIDER_SITE_OTHER): Payer: Medicare Other | Admitting: Family Medicine

## 2014-03-15 VITALS — BP 126/70 | HR 88 | Temp 98.1°F | Ht 64.5 in | Wt 188.2 lb

## 2014-03-15 DIAGNOSIS — Z23 Encounter for immunization: Secondary | ICD-10-CM

## 2014-03-15 DIAGNOSIS — E785 Hyperlipidemia, unspecified: Secondary | ICD-10-CM

## 2014-03-15 DIAGNOSIS — N958 Other specified menopausal and perimenopausal disorders: Secondary | ICD-10-CM

## 2014-03-15 DIAGNOSIS — S82842A Displaced bimalleolar fracture of left lower leg, initial encounter for closed fracture: Secondary | ICD-10-CM

## 2014-03-15 DIAGNOSIS — F341 Dysthymic disorder: Secondary | ICD-10-CM

## 2014-03-15 DIAGNOSIS — D649 Anemia, unspecified: Secondary | ICD-10-CM

## 2014-03-15 DIAGNOSIS — R739 Hyperglycemia, unspecified: Secondary | ICD-10-CM

## 2014-03-15 DIAGNOSIS — Z7189 Other specified counseling: Secondary | ICD-10-CM

## 2014-03-15 DIAGNOSIS — M8580 Other specified disorders of bone density and structure, unspecified site: Secondary | ICD-10-CM

## 2014-03-15 DIAGNOSIS — Z0000 Encounter for general adult medical examination without abnormal findings: Secondary | ICD-10-CM

## 2014-03-15 DIAGNOSIS — E8940 Asymptomatic postprocedural ovarian failure: Secondary | ICD-10-CM

## 2014-03-15 DIAGNOSIS — G47419 Narcolepsy without cataplexy: Secondary | ICD-10-CM

## 2014-03-15 HISTORY — PX: OTHER SURGICAL HISTORY: SHX169

## 2014-03-15 MED ORDER — ESTRADIOL 0.5 MG PO TABS
0.2500 mg | ORAL_TABLET | Freq: Every day | ORAL | Status: DC
Start: 2014-03-15 — End: 2015-03-28

## 2014-03-15 MED ORDER — NORTRIPTYLINE HCL 10 MG PO CAPS: 10.0000 mg | ORAL_CAPSULE | Freq: Every day | ORAL | Status: DC

## 2014-03-15 MED ORDER — FLUOXETINE HCL 40 MG PO CAPS: 80.0000 mg | ORAL_CAPSULE | Freq: Every day | ORAL | Status: DC

## 2014-03-15 MED ORDER — ARMODAFINIL 250 MG PO TABS: ORAL_TABLET | ORAL | Status: DC

## 2014-03-15 NOTE — Assessment & Plan Note (Signed)
Advanced directives - living will at home. Would want husband to be HCPOA.

## 2014-03-15 NOTE — Assessment & Plan Note (Signed)
Dx by Dr Joni Fears. Continue nuvigil.

## 2014-03-15 NOTE — Addendum Note (Signed)
Addended by: Royann Shivers A on: 03/15/2014 05:28 PM   Modules accepted: Orders

## 2014-03-15 NOTE — Addendum Note (Signed)
Addended by: [name] on: 03/15/2014 05:42 PM   Modules accepted: Orders, Level of Service, SmartSet

## 2014-03-15 NOTE — Progress Notes (Addendum)
BP 126/70 mmHg  Pulse 88  Temp(Src) 98.1 F (36.7 C) (Oral)  Ht 5' 4.5" (1.638 m)  Wt 188 lb 4 oz (85.39 kg)  BMI 31.83 kg/m2   CC: medicare wellness visit  Subjective:    Patient ID: Faith Santiago, female    DOB: 1941/02/16, 73 y.o.   MRN: [id number]  HPI: Faith Santiago is a 73 y.o. female presenting on 03/15/2014 for Annual Exam   On nuvigil for narcolepsy diagnosed by Dr Joni Fears. States this has significantly helped her daytime alertness.  Hot flashes and osteopenia - on estrogen for years. Interested in continued taper off  Depression - on prozac 40mg  daily as well as nortriptyline 10mg  nightly. saw Dr Rexene Edison x3. Not very helpful. Knows what she needs to do but doesn't do this. She has been more active in yard, had a good spring.   L ankle fracture s/p ORIF Mardelle Matte) - fracture occurred in [place].  Vision - [facility] (09/2013) Hearing - no problems. 3 falls in last year - down steps  Preventative: Mammogram 08/2011 WNL. Declines breast exam. COLONOSCOPY Date: 05/2003 WNL (Dr Velora Heckler) Pelvic - S/p hysterectomy for uterine cancer. Pap smear WNL 08/2011. Declines pelvic exam today. DEXA 2008 - normal. Rpt 03/14/2014 pending.  Flu - 12/2013 Pneumovax 2011, prevnar today. Td 2009 Shingles shot - discussed, will check with insurance Advanced directives - living will at home. Would want husband to be HCPOA.   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 Interim medical history since our last visit reviewed. Allergies and medications reviewed and updated.  Current Outpatient Prescriptions on File Prior to Visit  Medication Sig  . ALPRAZolam (XANAX) 0.25 MG tablet TAKE 1 TABLET  BY MOUTH AT BEDTIME AS NEEDED  . aspirin 81 MG EC tablet Take 81 mg by mouth. Three times weekly  . fish oil-omega-3 fatty acids 1000 MG capsule Take 1,200 mg by mouth daily.  . Multiple Vitamin (MULTIVITAMIN) tablet Take 1 tablet by mouth daily.   No current facility-administered medications on file prior to visit.   Past Medical History  Diagnosis Date  . Anxiety   . Arthritis     hands R>L, neck and lower back  . Depression   . Narcolepsy   . Anemia, unspecified   . Esophageal reflux   . HLD (hyperlipidemia)   . Osteopenia     DEXA WNL 2008  . Bimalleolar fracture of left ankle 11/19/2013    [place]  . History of uterine cancer 1999    s/p hysterectomy    Past Surgical History  Procedure Laterality Date  . Bilateral catarct removal  2010  . Colonoscopy  05/2003    WNL (Dr Velora Heckler)  . Total abdominal hysterectomy  1999    uterine cancer  . Orif ankle fracture Left 11/19/2013    Procedure: LEFT ANKLE FRACTURE OPEN TREATMENT BIMALLEOLAR ANKLE INCLUDES INTERNAL FIXATION ;  Surgeon: Johnny Bridge, MD    Review of Systems  Constitutional: Negative for fever, chills, activity change, appetite change, fatigue and unexpected weight change.  HENT: Negative for hearing loss.   Eyes: Negative for visual disturbance.  Respiratory: Negative for cough, chest tightness, shortness of breath and wheezing.   Cardiovascular: Positive for leg  swelling (L leg). Negative for chest pain and palpitations.  Gastrointestinal: Negative for nausea, vomiting, abdominal pain, diarrhea, constipation, blood in stool and abdominal distention.  Genitourinary: Negative for hematuria and difficulty urinating.  Musculoskeletal: Negative for myalgias, arthralgias and neck pain.  Skin: Negative for rash.  Neurological: Negative for dizziness, seizures, syncope and headaches.  Hematological: Negative for adenopathy. Does not bruise/bleed easily.  Psychiatric/Behavioral: Positive for dysphoric mood. The patient  is not nervous/anxious.    Per HPI unless specifically indicated above     Objective:    BP 126/70 mmHg  Pulse 88  Temp(Src) 98.1 F (36.7 C) (Oral)  Ht 5' 4.5" (1.638 m)  Wt 188 lb 4 oz (85.39 kg)  BMI 31.83 kg/m2  Wt Readings from Last 3 Encounters:  03/15/14 188 lb 4 oz (85.39 kg)  11/19/13 183 lb (83.008 kg)  07/19/13 191 lb (86.637 kg)    Physical Exam  Constitutional: She is oriented to person, place, and time. She appears well-developed and well-nourished. No distress.  HENT:  Head: Normocephalic and atraumatic.  Right Ear: Hearing, tympanic membrane, external ear and ear canal normal.  Left Ear: Hearing, tympanic membrane, external ear and ear canal normal.  Nose: Nose normal.  Mouth/Throat: Uvula is midline, oropharynx is clear and moist and mucous membranes are normal. No oropharyngeal exudate, posterior oropharyngeal edema or posterior oropharyngeal erythema.  Eyes: Conjunctivae and EOM are normal. Pupils are equal, round, and reactive to light. No scleral icterus.  Neck: Normal range of motion. Neck supple. Carotid bruit is not present. No thyromegaly present.  Cardiovascular: Normal rate, regular rhythm, normal heart sounds and intact distal pulses.   No murmur heard. Pulses:      Radial pulses are 2+ on the right side, and 2+ on the left side.  Pulmonary/Chest: Effort normal and breath sounds normal. No respiratory distress. She has no wheezes. She has no rales.  Breast- pt declines  Abdominal: Soft. Bowel sounds are normal. She exhibits no distension and no mass. There is no tenderness. There is no rebound and no guarding.  Genitourinary:  GYN - pt declines  Musculoskeletal: Normal range of motion. She exhibits no edema.  Lymphadenopathy:    She has no cervical adenopathy.  Neurological: She is alert and oriented to person, place, and time.  CN grossly intact, station and gait intact  Skin: Skin is warm and dry. No rash noted.  Psychiatric: She has a normal  mood and affect. Her behavior is normal. Judgment and thought content normal.  Nursing note and vitals reviewed.      Assessment & Plan:   Problem List Items Addressed This Visit    Postsurgical menopause    Discussed with patient - agrees to continue decreasing estrogen. Will send 0.5mg  estrogen for pt to take 1/2 tab daily.    Osteopenia    Pending DEXA results.    Relevant Orders      Vit D  25 hydroxy (rtn osteoporosis monitoring)   Narcolepsy without cataplexy(347.00)    Dx by Dr Joni Fears. Continue nuvigil.    Medicare annual wellness visit, subsequent - Primary    I have personally reviewed the Medicare Annual Wellness questionnaire and have noted 1. The patient's medical and social history 2. Their use of alcohol, tobacco or illicit drugs 3. Their current medications and supplements 4. The patient's functional ability including ADL's, fall risks, home safety risks and hearing or visual impairment. 5. Diet and physical activity 6. Evidence for depression or mood disorders The patients  weight, height, BMI have been recorded in the chart.  Hearing and vision has been addressed. I have made referrals, counseling and provided education to the patient based review of the above and I have provided the pt with a written personalized care plan for preventive services. Provider list updated - see scanned questionairre.  Reviewed preventative protocols and updated unless pt declined.     HLD (hyperlipidemia)    Return fasting for FLP    Relevant Orders      Lipid panel      Comprehensive metabolic panel   Health maintenance examination    Preventative protocols reviewed and updated unless pt declined. Discussed healthy diet and lifestyle.    Bimalleolar fracture of left ankle    S/p surgery, intermittent L ankle swelling.    ANXIETY DEPRESSION    Deteriorated. Increase prozac to 80mg  daily. Restart nortriptyline 10mg  nightly.    Relevant Medications      nortriptyline  (PAMELOR) capsule      FLUoxetine (PROZAC) 40 MG capsule   Anemia   Advanced care planning/counseling discussion    Advanced directives - living will at home. Would want husband to be HCPOA.      Other Visit Diagnoses    Need for vaccination with 13-polyvalent pneumococcal conjugate vaccine        Relevant Orders       Pneumococcal conjugate vaccine 13-valent (Completed)    Hyperglycemia        Relevant Orders       Hemoglobin A1c        Follow up plan: Return as needed, for medicare wellness.

## 2014-03-15 NOTE — Patient Instructions (Addendum)
prevnar today. Return at your convenience fasting for labs one morning in next few weeks. (kim to schedule this) Call to schedule mammogram as you're due. Call your insurance about the shingles shot to see if it is covered or how much it would cost and where is cheaper (here or pharmacy).  If you want to receive here, call for nurse visit. Decrease estrogen to 0.25mg  daily. New dose at pharmacy

## 2014-03-15 NOTE — Assessment & Plan Note (Signed)
Deteriorated. Increase prozac to 80mg  daily. Restart nortriptyline 10mg  nightly.

## 2014-03-15 NOTE — Assessment & Plan Note (Signed)
Return fasting for FLP

## 2014-03-15 NOTE — Progress Notes (Signed)
Pre visit review using our clinic review tool, if applicable. No additional management support is needed unless otherwise documented below in the visit note. 

## 2014-03-15 NOTE — Assessment & Plan Note (Signed)
Pending DEXA results.

## 2014-03-15 NOTE — Assessment & Plan Note (Signed)
Discussed with patient - agrees to continue decreasing estrogen. Will send 0.5mg  estrogen for pt to take 1/2 tab daily.

## 2014-03-15 NOTE — Assessment & Plan Note (Signed)
Preventative protocols reviewed and updated unless pt declined. Discussed healthy diet and lifestyle.  

## 2014-03-15 NOTE — Assessment & Plan Note (Signed)
S/p surgery, intermittent L ankle swelling.

## 2014-03-15 NOTE — Assessment & Plan Note (Addendum)

## 2014-03-17 ENCOUNTER — Other Ambulatory Visit (INDEPENDENT_AMBULATORY_CARE_PROVIDER_SITE_OTHER): Payer: Medicare Other

## 2014-03-17 DIAGNOSIS — R739 Hyperglycemia, unspecified: Secondary | ICD-10-CM

## 2014-03-17 DIAGNOSIS — M859 Disorder of bone density and structure, unspecified: Secondary | ICD-10-CM

## 2014-03-17 DIAGNOSIS — E785 Hyperlipidemia, unspecified: Secondary | ICD-10-CM

## 2014-03-17 DIAGNOSIS — M8580 Other specified disorders of bone density and structure, unspecified site: Secondary | ICD-10-CM

## 2014-03-17 LAB — LIPID PANEL
Cholesterol: 278 mg/dL — ABNORMAL HIGH (ref 0–200)
HDL: 98.9 mg/dL (ref >39.00)
LDL CALC: 157 mg/dL — AB (ref 0–99)
NonHDL: 179.1
Total CHOL/HDL Ratio: 3
Triglycerides: 110 mg/dL (ref 0.0–149.0)
VLDL: 22 mg/dL (ref 0.0–40.0)

## 2014-03-17 LAB — COMPREHENSIVE METABOLIC PANEL
ALBUMIN: 4 g/dL (ref 3.5–5.2)
ALK PHOS: 104 U/L (ref 39–117)
ALT: 21 U/L (ref 0–35)
AST: 24 U/L (ref 0–37)
BUN: 17 mg/dL (ref 6–23)
CO2: 24 mEq/L (ref 19–32)
Calcium: 9 mg/dL (ref 8.4–10.5)
Chloride: 103 mEq/L (ref 96–112)
Creatinine, Ser: 0.9 mg/dL (ref 0.4–1.2)
GFR: 69.98 mL/min (ref >60.00)
Glucose, Bld: 98 mg/dL (ref 70–99)
POTASSIUM: 4.7 meq/L (ref 3.5–5.1)
SODIUM: 140 meq/L (ref 135–145)
TOTAL PROTEIN: 6.9 g/dL (ref 6.0–8.3)
Total Bilirubin: 0.5 mg/dL (ref 0.2–1.2)

## 2014-03-17 LAB — VITAMIN D 25 HYDROXY (VIT D DEFICIENCY, FRACTURES): VITD: 23.98 ng/mL — ABNORMAL LOW (ref 30.00–100.00)

## 2014-03-17 LAB — HEMOGLOBIN A1C: HEMOGLOBIN A1C: 5.9 % (ref 4.6–6.5)

## 2014-03-21 ENCOUNTER — Encounter: Payer: Self-pay | Admitting: *Deleted

## 2014-03-21 ENCOUNTER — Encounter: Payer: Self-pay | Admitting: Family Medicine

## 2014-03-21 ENCOUNTER — Other Ambulatory Visit: Payer: Self-pay | Admitting: Family Medicine

## 2014-03-21 MED ORDER — VITAMIN D 1000 UNITS PO CAPS: 1000.0000 [IU] | ORAL_CAPSULE | Freq: Every day | ORAL | Status: DC

## 2014-05-16 ENCOUNTER — Other Ambulatory Visit: Payer: Self-pay | Admitting: *Deleted

## 2014-05-16 MED ORDER — ALPRAZOLAM 0.25 MG PO TABS: ORAL_TABLET | ORAL | Status: DC

## 2014-05-16 NOTE — Telephone Encounter (Signed)
plz mail or fax to [facility] and notify patient. In [facility] box

## 2014-05-16 NOTE — Telephone Encounter (Signed)
Patient called stating that she needs a refill on Alprazolam. Patient thought that she had refills on this and checked with her pharmacy and does not. Last refill 11/23/13 #90. Pharmacy-[facility]  Let patient know when this has been done.

## 2014-05-18 NOTE — Telephone Encounter (Signed)
Rx faxed as directed and patient notified.

## 2014-10-03 ENCOUNTER — Other Ambulatory Visit: Payer: Self-pay | Admitting: Family Medicine

## 2014-10-03 NOTE — Telephone Encounter (Signed)
Fax refill request, xanax last refilled on 05/16/14 #90 with 0 additional refills, Nuvigil last refilled on 03/15/14 #90 with 1 additional refill, please advise

## 2014-10-04 MED ORDER — ARMODAFINIL 250 MG PO TABS: ORAL_TABLET | ORAL | Status: DC

## 2014-10-04 MED ORDER — ALPRAZOLAM 0.25 MG PO TABS: ORAL_TABLET | ORAL | Status: DC

## 2014-10-04 NOTE — Telephone Encounter (Signed)
Rx faxed to [facility]

## 2014-10-04 NOTE — Telephone Encounter (Signed)
Printed and in [employer].

## 2014-10-11 ENCOUNTER — Telehealth: Payer: Self-pay | Admitting: *Deleted

## 2014-10-11 NOTE — Telephone Encounter (Signed)
PA for nuvigil in your IN box for completion.

## 2014-10-13 NOTE — Telephone Encounter (Signed)
PA faxed. Will await determination. 

## 2014-10-13 NOTE — Telephone Encounter (Signed)
Faith Santiago with [facility] PA dept said initially PA was denied due to time expiring; info was received and re-reviewing PA request; Determination will be sent to office.

## 2014-10-13 NOTE — Telephone Encounter (Signed)
Filled and in [facility] box. May need sleep study to confirm narcolepsy dx

## 2014-10-14 NOTE — Telephone Encounter (Signed)
PA approved.

## 2014-11-10 ENCOUNTER — Telehealth: Payer: Self-pay | Admitting: *Deleted

## 2014-11-10 NOTE — Telephone Encounter (Signed)
Mammogram f/u call: pt is due for mammogram, called pt and no answer so left voicemail requesting pt to call office back

## 2014-11-15 NOTE — Telephone Encounter (Signed)
Pt left v/m has been out of town and request cb.

## 2014-11-16 ENCOUNTER — Other Ambulatory Visit: Payer: Self-pay | Admitting: *Deleted

## 2014-11-16 NOTE — Telephone Encounter (Signed)
Patient reports she has not had a screening mammogram within the past 2 years.  She will contact [facility] [place] to schedule.

## 2014-11-18 ENCOUNTER — Other Ambulatory Visit: Payer: Self-pay

## 2014-11-18 DIAGNOSIS — Z1231 Encounter for screening mammogram for malignant neoplasm of breast: Secondary | ICD-10-CM

## 2014-11-25 ENCOUNTER — Ambulatory Visit (INDEPENDENT_AMBULATORY_CARE_PROVIDER_SITE_OTHER): Payer: Medicare Other | Admitting: Family Medicine

## 2014-11-25 ENCOUNTER — Encounter: Payer: Self-pay | Admitting: Family Medicine

## 2014-11-25 VITALS — BP 118/76 | HR 85 | Temp 98.1°F | Wt 191.5 lb

## 2014-11-25 DIAGNOSIS — G47419 Narcolepsy without cataplexy: Secondary | ICD-10-CM | POA: Diagnosis not present

## 2014-11-25 DIAGNOSIS — R5383 Other fatigue: Secondary | ICD-10-CM | POA: Insufficient documentation

## 2014-11-25 DIAGNOSIS — D649 Anemia, unspecified: Secondary | ICD-10-CM | POA: Diagnosis not present

## 2014-11-25 DIAGNOSIS — M19042 Primary osteoarthritis, left hand: Secondary | ICD-10-CM

## 2014-11-25 DIAGNOSIS — M19041 Primary osteoarthritis, right hand: Secondary | ICD-10-CM | POA: Diagnosis not present

## 2014-11-25 DIAGNOSIS — F418 Other specified anxiety disorders: Secondary | ICD-10-CM

## 2014-11-25 DIAGNOSIS — M19049 Primary osteoarthritis, unspecified hand: Secondary | ICD-10-CM | POA: Insufficient documentation

## 2014-11-25 MED ORDER — CITALOPRAM HYDROBROMIDE 10 MG PO TABS: 10.0000 mg | ORAL_TABLET | Freq: Every day | ORAL | Status: DC

## 2014-11-25 MED ORDER — ARMODAFINIL 250 MG PO TABS: ORAL_TABLET | ORAL | Status: DC

## 2014-11-25 NOTE — Progress Notes (Signed)
BP 118/76 mmHg  Pulse 85  Temp(Src) 98.1 F (36.7 C) (Oral)  Wt 191 lb 8 oz (86.864 kg)  SpO2 96%   CC: fatigue  Subjective:    Patient ID: Faith Santiago, female    DOB: 08-27-1940, 74 y.o.   MRN: [id number]  HPI: Faith Santiago is a 74 y.o. female presenting on 11/25/2014 for Fatigue   Not feeling well for several months. No energy, daytime somnolence, hand joint pains (attributes to arthritis). Hands and feet tingle, burn, itch. Cramps in feet at night time.   Off nuvigil for last 2 months (not approved by insurance) but states this is the best medicine , self stopped prozac 1 mo ago,   Depression - off prozac. wellbutrin ineffective. Denies SI/HI. Excessive sleeping, decreased energy, trouble with concentration. Appetite ok. Has seen Dr Rexene Edison several years ago - not too helpful.   No fevers/chills, weight changes, chest pain, tightness or dyspnea, leg swelling, abd pain, nausea/vomiting, diarrhea/constipation. No noted bleeding. No night sweats.   Relevant past medical, surgical, family and social history reviewed and updated as indicated. Interim medical history since our last visit reviewed. Allergies and medications reviewed and updated. Current Outpatient Prescriptions on File Prior to Visit  Medication Sig  . ALPRAZolam (XANAX) 0.25 MG tablet TAKE 1 TABLET BY MOUTH AT BEDTIME AS NEEDED  . diclofenac (VOLTAREN) 75 MG EC tablet Take 75 mg by mouth 2 (two) times daily.  Marland Kitchen estradiol (ESTRACE) 0.5 MG tablet Take 0.5 tablets (0.25 mg total) by mouth daily.  . Multiple Vitamin (MULTIVITAMIN) tablet Take 1 tablet by mouth daily.  Marland Kitchen aspirin 81 MG EC tablet Take 81 mg by mouth. Three times weekly  . Cholecalciferol (VITAMIN D) 1000 UNITS capsule Take 1 capsule (1,000 Units total) by mouth daily. (Patient not taking: Reported on 11/25/2014)   No current facility-administered medications on file prior to visit.    Review of Systems Per HPI unless specifically indicated above    Objective:    BP 118/76 mmHg  Pulse 85  Temp(Src) 98.1 F (36.7 C) (Oral)  Wt 191 lb 8 oz (86.864 kg)  SpO2 96%  Wt Readings from Last 3 Encounters:  11/25/14 191 lb 8 oz (86.864 kg)  03/15/14 188 lb 4 oz (85.39 kg)  11/19/13 183 lb (83.008 kg)    Physical Exam  Constitutional: She appears well-developed and well-nourished. No distress.  HENT:  Mouth/Throat: Oropharynx is clear and moist. No oropharyngeal exudate.  Eyes: Conjunctivae and EOM are normal. Pupils are equal, round, and reactive to light.  subconjunctival pallor present  Neck: Normal range of motion. Neck supple. No thyromegaly present.  Cardiovascular: Normal rate, regular rhythm, normal heart sounds and intact distal pulses.   No murmur heard. Pulmonary/Chest: Effort normal and breath sounds normal. No respiratory distress. She has no wheezes. She has no rales.  Musculoskeletal: She exhibits no edema.  Skin: Skin is warm and dry. No rash noted.  Psychiatric: Her speech is normal and behavior is normal. Judgment and thought content normal. Cognition and memory are normal. She exhibits a depressed mood.  Tearful with discussion of fatigue and mood  Nursing note and vitals reviewed.     Assessment & Plan:   Problem List Items Addressed This Visit    Anemia   Relevant Orders   CBC with Differential/Platelet   Depression with anxiety - Primary    Deteriorated off prozac and nortriptyline.  Start celexa.  Discussed option of referral to counseling as well.  PHQ9 = 25/27, extremely difficulty to function GAD7 = 14/21      Narcolepsy without cataplexy    Sent in nuvigil again. Records show recent PA approval but pt has had difficulty receiving from pharmacy.      Osteoarthritis    Anticipate hand pains due to this. Discussed.      Other fatigue    Check for reversible causes of fatigue. Paresthesias + fatigue - need to recheck B12. Some pallor on exam - check CBC.      Relevant Orders   Vitamin B12    Folate   Comprehensive metabolic panel   TSH   CBC with Differential/Platelet       Follow up plan: Return in about 6 weeks (around 01/06/2015), or as needed, for follow up visit.

## 2014-11-25 NOTE — Assessment & Plan Note (Signed)
Deteriorated off prozac and nortriptyline.  Start celexa.  Discussed option of referral to counseling as well.  PHQ9 = 25/27, extremely difficulty to function GAD7 = 14/21

## 2014-11-25 NOTE — Assessment & Plan Note (Addendum)
Sent in nuvigil again. Records show recent PA approval but pt has had difficulty receiving from pharmacy.

## 2014-11-25 NOTE — Assessment & Plan Note (Signed)
Check for reversible causes of fatigue. Paresthesias + fatigue - need to recheck B12. Some pallor on exam - check CBC.

## 2014-11-25 NOTE — Assessment & Plan Note (Signed)
Anticipate hand pains due to this. Discussed.

## 2014-11-25 NOTE — Patient Instructions (Addendum)
Nuvigil printed out today to try again.  Blood work today. Start vitamin D 1000 units daily. Continue trying to wean off estradiol. Start celexa 10mg  daily. Return in 4-6 weeks for follow up visit

## 2014-11-25 NOTE — Progress Notes (Signed)
Pre visit review using our clinic review tool, if applicable. No additional management support is needed unless otherwise documented below in the visit note. 

## 2014-11-25 NOTE — Addendum Note (Signed)
Addended by: [name] on: 11/25/2014 05:05 PM   Modules accepted: Orders

## 2014-11-26 LAB — COMPREHENSIVE METABOLIC PANEL
ALT: 16 U/L (ref 6–29)
AST: 22 U/L (ref 10–35)
Albumin: 3.8 g/dL (ref 3.6–5.1)
Alkaline Phosphatase: 89 U/L (ref 33–130)
BILIRUBIN TOTAL: 0.3 mg/dL (ref 0.2–1.2)
BUN: 15 mg/dL (ref 7–25)
CO2: 27 mmol/L (ref 20–31)
Calcium: 9.2 mg/dL (ref 8.6–10.4)
Chloride: 104 mmol/L (ref 98–110)
Creat: 1.14 mg/dL — ABNORMAL HIGH (ref 0.60–0.93)
Glucose, Bld: 99 mg/dL (ref 65–99)
Potassium: 4.7 mmol/L (ref 3.5–5.3)
Sodium: 140 mmol/L (ref 135–146)
TOTAL PROTEIN: 6.3 g/dL (ref 6.1–8.1)

## 2014-11-26 LAB — CBC WITH DIFFERENTIAL/PLATELET
Basophils Absolute: 0 10*3/uL (ref 0.0–0.1)
Basophils Relative: 0 % (ref 0–1)
EOS ABS: 0.3 10*3/uL (ref 0.0–0.7)
EOS PCT: 4 % (ref 0–5)
HEMATOCRIT: 37.1 % (ref 36.0–46.0)
HEMOGLOBIN: 12.6 g/dL (ref 12.0–15.0)
LYMPHS PCT: 27 % (ref 12–46)
Lymphs Abs: 1.9 10*3/uL (ref 0.7–4.0)
MCH: 34.5 pg — ABNORMAL HIGH (ref 26.0–34.0)
MCHC: 34 g/dL (ref 30.0–36.0)
MCV: 101.6 fL — AB (ref 78.0–100.0)
MONO ABS: 0.7 10*3/uL (ref 0.1–1.0)
MPV: 9.2 fL (ref 8.6–12.4)
Monocytes Relative: 10 % (ref 3–12)
Neutro Abs: 4.1 10*3/uL (ref 1.7–7.7)
Neutrophils Relative %: 59 % (ref 43–77)
Platelets: 285 10*3/uL (ref 150–400)
RBC: 3.65 MIL/uL — AB (ref 3.87–5.11)
RDW: 13.8 % (ref 11.5–15.5)
WBC: 7 10*3/uL (ref 4.0–10.5)

## 2014-11-26 LAB — TSH: TSH: 3.574 u[IU]/mL (ref 0.350–4.500)

## 2014-11-26 LAB — FOLATE: Folate: >20 ng/mL

## 2014-11-26 LAB — VITAMIN B12: VITAMIN B 12: 513 pg/mL (ref 211–911)

## 2014-11-26 MED ORDER — CITALOPRAM HYDROBROMIDE 10 MG PO TABS: 10.0000 mg | ORAL_TABLET | Freq: Every day | ORAL | Status: DC

## 2014-11-26 NOTE — Addendum Note (Signed)
Addended by: [name] on: 11/26/2014 11:29 AM   Modules accepted: Orders

## 2014-11-28 ENCOUNTER — Ambulatory Visit
Admission: RE | Admit: 2014-11-28 | Discharge: 2014-11-28 | Disposition: A | Discharge status: Home / Self Care | Payer: Medicare Other | Source: Ambulatory Visit

## 2014-11-28 DIAGNOSIS — Z1231 Encounter for screening mammogram for malignant neoplasm of breast: Secondary | ICD-10-CM

## 2014-11-29 LAB — HM MAMMOGRAPHY: HM Mammogram: NORMAL

## 2014-11-30 ENCOUNTER — Encounter: Payer: Self-pay | Admitting: *Deleted

## 2015-02-19 ENCOUNTER — Other Ambulatory Visit: Payer: Self-pay | Admitting: Family Medicine

## 2015-03-03 ENCOUNTER — Other Ambulatory Visit: Payer: Self-pay | Admitting: *Deleted

## 2015-03-03 MED ORDER — ALPRAZOLAM 0.25 MG PO TABS: ORAL_TABLET | ORAL | Status: DC

## 2015-03-03 NOTE — Telephone Encounter (Signed)
Printed and in Kim[facility] box 

## 2015-03-03 NOTE — Telephone Encounter (Signed)
Ok to refill to mail order? Will need written Rx. 

## 2015-03-06 NOTE — Telephone Encounter (Signed)
Rx faxed to mail order 

## 2015-03-28 ENCOUNTER — Ambulatory Visit (INDEPENDENT_AMBULATORY_CARE_PROVIDER_SITE_OTHER): Payer: Medicare Other | Admitting: Family Medicine

## 2015-03-28 ENCOUNTER — Encounter: Payer: Self-pay | Admitting: Family Medicine

## 2015-03-28 VITALS — BP 120/60 | HR 86 | Temp 98.3°F | Wt 192.0 lb

## 2015-03-28 DIAGNOSIS — D7589 Other specified diseases of blood and blood-forming organs: Secondary | ICD-10-CM

## 2015-03-28 DIAGNOSIS — G47419 Narcolepsy without cataplexy: Secondary | ICD-10-CM | POA: Diagnosis not present

## 2015-03-28 DIAGNOSIS — F418 Other specified anxiety disorders: Secondary | ICD-10-CM | POA: Diagnosis not present

## 2015-03-28 DIAGNOSIS — R5383 Other fatigue: Secondary | ICD-10-CM

## 2015-03-28 DIAGNOSIS — Z23 Encounter for immunization: Secondary | ICD-10-CM | POA: Diagnosis not present

## 2015-03-28 MED ORDER — CITALOPRAM HYDROBROMIDE 20 MG PO TABS: 20.0000 mg | ORAL_TABLET | Freq: Every day | ORAL | Status: DC

## 2015-03-28 NOTE — Progress Notes (Signed)
BP 120/60 mmHg  Pulse 86  Temp(Src) 98.3 F (36.8 C) (Tympanic)  Wt 192 lb (87.091 kg)  SpO2 98%   CC: f/u visit  Subjective:    Patient ID: Faith Santiago, female    DOB: 1941-02-05, 74 y.o.   MRN: [id number]  HPI: Faith Santiago is a 74 y.o. female presenting on 03/28/2015 for Follow-up   Depression - on celexa 10mg  daily. Feels this is helping but interested in higher dose. Wellbutrin and prozac and notriptyline ineffective in the past.  She has also restarted b12 1074mcg oral daily. Noticing persistent fatigue/lack of energy and mild weight gain. Endorses dry skin, fine hair. No bowel changes. fmhx hypothyroidism. Still not interested in counseling.   [facility]   Relevant past medical, surgical, family and social history reviewed and updated as indicated. Interim medical history since our last visit reviewed. Allergies and medications reviewed and updated. Current Outpatient Prescriptions on File Prior to Visit  Medication Sig  . ALPRAZolam (XANAX) 0.25 MG tablet TAKE 1 TABLET BY MOUTH AT BEDTIME AS NEEDED  . Armodafinil (NUVIGIL) 250 MG tablet TAKE 1 TABLET BY MOUTH EVERY DAY  . aspirin 81 MG EC tablet Take 81 mg by mouth. Three times weekly  . Cholecalciferol (VITAMIN D) 1000 UNITS capsule Take 1 capsule (1,000 Units total) by mouth daily.  . diclofenac (VOLTAREN) 75 MG EC tablet Take 75 mg by mouth 2 (two) times daily.  . Multiple Vitamin (MULTIVITAMIN) tablet Take 1 tablet by mouth daily.   No current facility-administered medications on file prior to visit.    Review of Systems Per HPI unless specifically indicated in ROS section     Objective:    BP 120/60 mmHg  Pulse 86  Temp(Src) 98.3 F (36.8 C) (Tympanic)  Wt 192 lb (87.091 kg)  SpO2 98%  Wt Readings from Last 3 Encounters:  03/28/15 192 lb (87.091 kg)  11/25/14 191 lb 8 oz (86.864 kg)  03/15/14 188 lb 4 oz (85.39 kg)    Physical Exam  Constitutional: She appears  well-developed and well-nourished. No distress.  HENT:  Mouth/Throat: Oropharynx is clear and moist. No oropharyngeal exudate.  Eyes: Conjunctivae and EOM are normal. Pupils are equal, round, and reactive to light. No scleral icterus.  Neck: Normal range of motion. Neck supple. No thyromegaly present.  Cardiovascular: Normal rate, regular rhythm, normal heart sounds and intact distal pulses.   No murmur heard. Pulmonary/Chest: Effort normal and breath sounds normal. No respiratory distress. She has no wheezes. She has no rales.  Musculoskeletal: She exhibits no edema.  Lymphadenopathy:    She has no cervical adenopathy.  Skin: Skin is warm and dry. No rash noted.  Psychiatric: She has a normal mood and affect.  Nursing note and vitals reviewed.  Results for orders placed or performed in visit on 11/30/14  [facility]  Result Value Ref Range   [facility] Mammogram Normal Birads 1-Repeat 1 year    Lab Results  Component Value Date   TSH 3.574 11/25/2014       Assessment & Plan:   Problem List Items Addressed This Visit    Other fatigue    Pt remains worried about thyroid dysfunction - and exhibits several sxs consistent with this despite normal TSH. Will check full TFTs at next medicare wellness visit. Pt will return for this in next 1-2 mo.      Narcolepsy without cataplexy    Continue generic nuvigil  Macrocytosis    Ongoing. Consider periph smear next labs.      Depression with anxiety - Primary    Improved. Increase celexa to 20mg  daily. Not interested in psychotherapy.           Follow up plan: Return as needed.

## 2015-03-28 NOTE — Assessment & Plan Note (Signed)
Improved. Increase celexa to 20mg  daily. Not interested in psychotherapy.

## 2015-03-28 NOTE — Assessment & Plan Note (Signed)
Pt remains worried about thyroid dysfunction - and exhibits several sxs consistent with this despite normal TSH. Will check full TFTs at next medicare wellness visit. Pt will return for this in next 1-2 mo.

## 2015-03-28 NOTE — Assessment & Plan Note (Signed)
Ongoing. Consider periph smear next labs.

## 2015-03-28 NOTE — Patient Instructions (Addendum)
Flu shot today Let's increase celexa to 20mg  daily.  We will check thyroid function tests at your next lab visit. Return at your convenience for medicare wellness visit with fasting labs prior.  Spots on neck look like benign skin tags.

## 2015-03-28 NOTE — Assessment & Plan Note (Signed)
Continue generic nuvigil

## 2015-03-28 NOTE — Addendum Note (Signed)
Addended by: Despina Hidden on: 03/28/2015 02:39 PM   Modules accepted: Orders, Medications

## 2015-04-02 ENCOUNTER — Other Ambulatory Visit: Payer: Self-pay | Admitting: Family Medicine

## 2015-04-02 DIAGNOSIS — E785 Hyperlipidemia, unspecified: Secondary | ICD-10-CM

## 2015-04-02 DIAGNOSIS — E559 Vitamin D deficiency, unspecified: Secondary | ICD-10-CM | POA: Insufficient documentation

## 2015-04-03 ENCOUNTER — Other Ambulatory Visit: Payer: Self-pay | Admitting: Family Medicine

## 2015-04-03 ENCOUNTER — Other Ambulatory Visit (INDEPENDENT_AMBULATORY_CARE_PROVIDER_SITE_OTHER): Payer: Medicare Other

## 2015-04-03 DIAGNOSIS — F418 Other specified anxiety disorders: Secondary | ICD-10-CM

## 2015-04-03 DIAGNOSIS — E559 Vitamin D deficiency, unspecified: Secondary | ICD-10-CM | POA: Diagnosis not present

## 2015-04-03 DIAGNOSIS — D7589 Other specified diseases of blood and blood-forming organs: Secondary | ICD-10-CM | POA: Diagnosis not present

## 2015-04-03 DIAGNOSIS — E785 Hyperlipidemia, unspecified: Secondary | ICD-10-CM

## 2015-04-03 LAB — LIPID PANEL
CHOL/HDL RATIO: 3
Cholesterol: 246 mg/dL — ABNORMAL HIGH (ref 0–200)
HDL: 74.8 mg/dL (ref >39.00)
NONHDL: 171.3
Triglycerides: 246 mg/dL — ABNORMAL HIGH (ref 0.0–149.0)
VLDL: 49.2 mg/dL — ABNORMAL HIGH (ref 0.0–40.0)

## 2015-04-03 LAB — T3: T3, Total: 70 ng/dL — ABNORMAL LOW (ref 80.0–204.0)

## 2015-04-03 LAB — BASIC METABOLIC PANEL
BUN: 27 mg/dL — ABNORMAL HIGH (ref 6–23)
CO2: 26 mEq/L (ref 19–32)
Calcium: 9 mg/dL (ref 8.4–10.5)
Chloride: 104 mEq/L (ref 96–112)
Creatinine, Ser: 0.93 mg/dL (ref 0.40–1.20)
GFR: 62.89 mL/min (ref >60.00)
Glucose, Bld: 80 mg/dL (ref 70–99)
Potassium: 5.3 mEq/L — ABNORMAL HIGH (ref 3.5–5.1)
Sodium: 138 mEq/L (ref 135–145)

## 2015-04-03 LAB — TSH: TSH: 4.84 u[IU]/mL — AB (ref 0.35–4.50)

## 2015-04-03 LAB — VITAMIN D 25 HYDROXY (VIT D DEFICIENCY, FRACTURES): VITD: 25.81 ng/mL — ABNORMAL LOW (ref 30.00–100.00)

## 2015-04-03 LAB — LDL CHOLESTEROL, DIRECT: LDL DIRECT: 136 mg/dL

## 2015-04-03 LAB — T4, FREE: Free T4: 0.58 ng/dL — ABNORMAL LOW (ref 0.60–1.60)

## 2015-04-04 ENCOUNTER — Other Ambulatory Visit: Payer: Self-pay | Admitting: Family Medicine

## 2015-04-04 LAB — PATHOLOGIST SMEAR REVIEW

## 2015-04-04 MED ORDER — LEVOTHYROXINE SODIUM 50 MCG PO TABS: 50.0000 ug | ORAL_TABLET | Freq: Every day | ORAL | Status: DC

## 2015-04-07 ENCOUNTER — Encounter: Payer: Self-pay | Admitting: Family Medicine

## 2015-04-07 ENCOUNTER — Ambulatory Visit (INDEPENDENT_AMBULATORY_CARE_PROVIDER_SITE_OTHER): Payer: Medicare Other | Admitting: Family Medicine

## 2015-04-07 VITALS — BP 122/78 | HR 74 | Temp 97.2°F | Ht 65.25 in | Wt 197.0 lb

## 2015-04-07 DIAGNOSIS — Z7189 Other specified counseling: Secondary | ICD-10-CM

## 2015-04-07 DIAGNOSIS — Z1211 Encounter for screening for malignant neoplasm of colon: Secondary | ICD-10-CM

## 2015-04-07 DIAGNOSIS — M19041 Primary osteoarthritis, right hand: Secondary | ICD-10-CM

## 2015-04-07 DIAGNOSIS — E039 Hypothyroidism, unspecified: Secondary | ICD-10-CM

## 2015-04-07 DIAGNOSIS — E559 Vitamin D deficiency, unspecified: Secondary | ICD-10-CM

## 2015-04-07 DIAGNOSIS — E785 Hyperlipidemia, unspecified: Secondary | ICD-10-CM

## 2015-04-07 DIAGNOSIS — R5383 Other fatigue: Secondary | ICD-10-CM

## 2015-04-07 DIAGNOSIS — Z0000 Encounter for general adult medical examination without abnormal findings: Secondary | ICD-10-CM | POA: Diagnosis not present

## 2015-04-07 DIAGNOSIS — E8940 Asymptomatic postprocedural ovarian failure: Secondary | ICD-10-CM

## 2015-04-07 DIAGNOSIS — F332 Major depressive disorder, recurrent severe without psychotic features: Secondary | ICD-10-CM

## 2015-04-07 DIAGNOSIS — E875 Hyperkalemia: Secondary | ICD-10-CM

## 2015-04-07 DIAGNOSIS — G47419 Narcolepsy without cataplexy: Secondary | ICD-10-CM

## 2015-04-07 DIAGNOSIS — M19042 Primary osteoarthritis, left hand: Secondary | ICD-10-CM

## 2015-04-07 MED ORDER — LEVOTHYROXINE SODIUM 50 MCG PO TABS: 50.0000 ug | ORAL_TABLET | Freq: Every day | ORAL | Status: DC

## 2015-04-07 NOTE — Assessment & Plan Note (Signed)

## 2015-04-07 NOTE — Assessment & Plan Note (Signed)
Anticipate hypothyroid and depression related - start levothyroxine and monitor effect.

## 2015-04-07 NOTE — Assessment & Plan Note (Signed)
She continues diclofenac BID. Suggested trial BID PRN.

## 2015-04-07 NOTE — Assessment & Plan Note (Signed)
Stopped estrogen and doing well.

## 2015-04-07 NOTE — Assessment & Plan Note (Signed)
Advanced directives - living will at home. Would want husband to be HCPOA. Will bring me copy.  

## 2015-04-07 NOTE — Assessment & Plan Note (Signed)
New diagnosis. Acquired hypothyroidism in fmhx same. Start levothyroxine 49mcg daily, discussed administration of med - first thing in am with glass of water, separate from MVI by 4 hrs. Pt agrees. RTC 4-6 wks for recheck.

## 2015-04-07 NOTE — Addendum Note (Signed)
Addended by: [name] on: 04/07/2015 12:49 PM   Modules accepted: Orders

## 2015-04-07 NOTE — Progress Notes (Signed)
BP 122/78 mmHg  Pulse 74  Temp(Src) 97.2 F (36.2 C) (Oral)  Ht 5' 5.25" (1.657 m)  Wt 197 lb (89.359 kg)  BMI 32.55 kg/m2  SpO2 98%   CC: medicare wellness visit  Subjective:    Patient ID: Faith Santiago, female    DOB: Jan 02, 1941, 74 y.o.   MRN: [id number]  HPI: Faith Santiago is a 74 y.o. female presenting on 04/07/2015 for No chief complaint on file.   Recent visit with depression, fatigue and other hypothyroid symptoms despite previously normal TFTs. On rpt testing, TSH was mildly elevated but free T4 and T3 were low so we started levothyroxine 52mg daily.   On nuvigil for narcolepsy diagnosed by Dr SJoni Fears States this has significantly helped her daytime alertness.  Depression - on celexa 253mdaily.   [facility]) Hearing - no problems. 1 fall in past year without injury (tripped outdoors at night)  + depression (PHQ9 = 19). Extremely difficult to function. + passive SI but no action plan. Did not feel counsling was helpful.   Preventative: COLONOSCOPY Date: 05/2003 WNL (Dr LeVelora Heckler- discussed, would like stool kit Mammogram 11/2014 WNL. Declines breast exam. DEXA Date: 03/2014 WNL T score +3.9 Pelvic - S/p hysterectomy for uterine cancer. Pap smear WNL 08/2011. Tapered off estrogen therapy over summer. Declines pelvic exam today.  Flu - 03/2015 Pneumovax 2011, prevnar 2015. Td 2009 Shingles shot - discussed, will check with insurance Advanced directives - living will at home. Would want husband to be HCPOA. Will bring me copy. 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 Interim medical history  since our last visit reviewed. Allergies and medications reviewed and updated. Current Outpatient Prescriptions on File Prior to Visit  Medication Sig  . ALPRAZolam (XANAX) 0.25 MG tablet TAKE 1 TABLET BY MOUTH AT BEDTIME AS NEEDED  . Armodafinil (NUVIGIL) 250 MG tablet TAKE 1 TABLET BY MOUTH EVERY DAY  . aspirin 81 MG EC tablet Take 81 mg by mouth. Three times weekly  . Cholecalciferol (VITAMIN D) 1000 UNITS capsule Take 1 capsule (1,000 Units total) by mouth daily.  . citalopram (CELEXA) 20 MG tablet Take 1 tablet (20 mg total) by mouth daily.  . diclofenac (VOLTAREN) 75 MG EC tablet Take 75 mg by mouth 2 (two) times daily.  . Multiple Vitamin (MULTIVITAMIN) tablet Take 1 tablet by mouth daily.  . vitamin B-12 (CYANOCOBALAMIN) 1000 MCG tablet Take 1,000 mcg by mouth daily.   No current facility-administered medications on file prior to visit.    Review of Systems  Constitutional: Negative for fever, chills, activity change, appetite change, fatigue and unexpected weight change.  HENT: Negative for hearing loss.   Eyes: Negative for visual disturbance.  Respiratory: Negative for cough, chest tightness, shortness of breath and wheezing.   Cardiovascular: Negative for chest pain, palpitations and leg swelling.  Gastrointestinal: Negative for nausea, vomiting, abdominal pain, diarrhea, constipation, blood in stool and abdominal distention.  Genitourinary: Negative for hematuria and difficulty urinating.  Musculoskeletal: Negative for myalgias, arthralgias and neck pain.  Skin: Negative for rash.  Neurological: Negative for dizziness, seizures, syncope and headaches.  Hematological: Negative for adenopathy. Does not bruise/bleed  easily.  Psychiatric/Behavioral: Negative for dysphoric mood. The patient is not nervous/anxious.    Per HPI unless specifically indicated in ROS section     Objective:    BP 122/78 mmHg  Pulse 74  Temp(Src) 97.2 F (36.2 C) (Oral)  Ht 5' 5.25" (1.657 m)   Wt 197 lb (89.359 kg)  BMI 32.55 kg/m2  SpO2 98%  Wt Readings from Last 3 Encounters:  04/07/15 197 lb (89.359 kg)  03/28/15 192 lb (87.091 kg)  11/25/14 191 lb 8 oz (86.864 kg)    Physical Exam  Constitutional: She is oriented to person, place, and time. She appears well-developed and well-nourished. No distress.  HENT:  Head: Normocephalic and atraumatic.  Right Ear: Hearing, tympanic membrane, external ear and ear canal normal.  Left Ear: Hearing, tympanic membrane, external ear and ear canal normal.  Nose: Nose normal.  Mouth/Throat: Uvula is midline, oropharynx is clear and moist and mucous membranes are normal. No oropharyngeal exudate, posterior oropharyngeal edema or posterior oropharyngeal erythema.  Eyes: Conjunctivae and EOM are normal. Pupils are equal, round, and reactive to light. No scleral icterus.  Neck: Normal range of motion. Neck supple. Carotid bruit is not present. No thyromegaly present.  Cardiovascular: Normal rate, regular rhythm, normal heart sounds and intact distal pulses.   No murmur heard. Pulses:      Radial pulses are 2+ on the right side, and 2+ on the left side.  Pulmonary/Chest: Effort normal and breath sounds normal. No respiratory distress. She has no wheezes. She has no rales.  Abdominal: Soft. Bowel sounds are normal. She exhibits no distension and no mass. There is no tenderness. There is no rebound and no guarding.  Musculoskeletal: Normal range of motion. She exhibits no edema.  Lymphadenopathy:    She has no cervical adenopathy.  Neurological: She is alert and oriented to person, place, and time.  CN grossly intact, station and gait intact Recall 3/3  Calculation 5/5 serial 7s  Skin: Skin is warm and dry. No rash noted.  Psychiatric: She has a normal mood and affect. Her behavior is normal. Judgment and thought content normal.  Nursing note and vitals reviewed.  Results for orders placed or performed in visit on 04/03/15  Lipid panel    Result Value Ref Range   Cholesterol 246 (H) 0 - 200 mg/dL   Triglycerides 246.0 (H) 0.0 - 149.0 mg/dL   HDL 74.80 >39.00 mg/dL   VLDL 49.2 (H) 0.0 - 40.0 mg/dL   Total CHOL/HDL Ratio 3    NonHDL 539.76   Basic metabolic panel  Result Value Ref Range   Sodium 138 135 - 145 mEq/L   Potassium 5.3 (H) 3.5 - 5.1 mEq/L   Chloride 104 96 - 112 mEq/L   CO2 26 19 - 32 mEq/L   Glucose, Bld 80 70 - 99 mg/dL   BUN 27 (H) 6 - 23 mg/dL   Creatinine, Ser 0.93 0.40 - 1.20 mg/dL   Calcium 9.0 8.4 - 10.5 mg/dL   GFR 62.89 >60.00 mL/min  VITAMIN D 25 Hydroxy (Vit-D Deficiency, Fractures)  Result Value Ref Range   VITD 25.81 (L) 30.00 - 100.00 ng/mL  TSH  Result Value Ref Range   TSH 4.84 (H) 0.35 - 4.50 uIU/mL  T4, free  Result Value Ref Range   Free T4 0.58 (L) 0.60 - 1.60 ng/dL  T3  Result Value Ref Range   T3, Total 70.0 (L) 80.0 - 204.0 ng/dL  LDL cholesterol, direct  Result Value Ref  Range   Direct LDL 136.0 mg/dL      Assessment & Plan:   Problem List Items Addressed This Visit    Vitamin D deficiency    Again low - continue 1000 iu daily. If remaining elevated consider increase to 2000 IU daily.      Postsurgical menopause    Stopped estrogen and doing well.       Other fatigue    Anticipate hypothyroid and depression related - start levothyroxine and monitor effect.      Osteoarthritis    She continues diclofenac BID. Suggested trial BID PRN.       Narcolepsy without cataplexy    Continue generic nuvigil      Medicare annual wellness visit, subsequent - Primary    I have personally reviewed the Medicare Annual Wellness questionnaire and have noted 1. The patient's medical and social history 2. Their use of alcohol, tobacco or illicit drugs 3. Their current medications and supplements 4. The patient's functional ability including ADL's, fall risks, home safety risks and hearing or visual impairment. Cognitive function has been assessed and addressed as indicated.   5. Diet and physical activity 6. Evidence for depression or mood disorders The patients weight, height, BMI have been recorded in the chart. I have made referrals, counseling and provided education to the patient based on review of the above and I have provided the pt with a written personalized care plan for preventive services. Provider list updated.. See scanned questionairre as needed for further documentation. Reviewed preventative protocols and updated unless pt declined.       MDD (major depressive disorder), recurrent episode, severe ([place])    Deteriorated. Longstanding passive SI but no active plan or thoughts. PHQ9 = 19. Previous counseling not helpful. Anticipate hypothyroidism contributing. Will monitor on levothyroxine, higher celexa dose, close outpt f/u. Pt agrees.      Hypothyroidism    New diagnosis. Acquired hypothyroidism in fmhx same. Start levothyroxine 39mg daily, discussed administration of med - first thing in am with glass of water, separate from MVI by 4 hrs. Pt agrees. RTC 4-6 wks for recheck.      Relevant Medications   levothyroxine (SYNTHROID, LEVOTHROID) 50 MCG tablet   HLD (hyperlipidemia)    Elevated predominantly triglycerides. Discussed diet changes to lower levels. Recheck in 6 mo. High HDL.       Health maintenance examination    Preventative protocols reviewed and updated unless pt declined. Discussed healthy diet and lifestyle.       Advanced care planning/counseling discussion    Advanced directives - living will at home. Would want husband to be HCPOA. Will bring me copy.       Other Visit Diagnoses    Special screening for malignant neoplasms, colon        Relevant Orders    Fecal occult blood, imunochemical        Follow up plan: Return in about 6 weeks (around 05/19/2015), or as needed, for follow up visit.

## 2015-04-07 NOTE — Assessment & Plan Note (Signed)
Elevated predominantly triglycerides. Discussed diet changes to lower levels. Recheck in 6 mo. High HDL.

## 2015-04-07 NOTE — Assessment & Plan Note (Signed)
Again low - continue 1000 iu daily. If remaining elevated consider increase to 2000 IU daily.

## 2015-04-07 NOTE — Assessment & Plan Note (Signed)
Preventative protocols reviewed and updated unless pt declined. Discussed healthy diet and lifestyle.  

## 2015-04-07 NOTE — Assessment & Plan Note (Signed)
Continue generic nuvigil

## 2015-04-07 NOTE — Assessment & Plan Note (Addendum)
Deteriorated. Longstanding passive SI but no active plan or thoughts. PHQ9 = 19. Previous counseling not helpful. Anticipate hypothyroidism contributing. Will monitor on levothyroxine, higher celexa dose, close outpt f/u. Pt agrees.

## 2015-04-07 NOTE — Patient Instructions (Addendum)
Pass by lab to pick up a stool kit. Call your insurance about the shingles shot to see if it is covered or how much it would cost and where is cheaper (here or pharmacy).  If you want to receive here, call for nurse visit.  Bring me copy of your advanced directive.  Return in 6 weeks for follow up mood and thyroid.  Call us sooner with questions.   Health Maintenance, Female Adopting a healthy lifestyle and getting preventive care can go a long way to promote health and wellness. Talk with your health care provider about what schedule of regular examinations is right for you. This is a good chance for you to check in with your provider about disease prevention and staying healthy. In between checkups, there are plenty of things you can do on your own. Experts have done a lot of research about which lifestyle changes and preventive measures are most likely to keep you healthy. Ask your health care provider for more information. WEIGHT AND DIET  Eat a healthy diet  Be sure to include plenty of vegetables, fruits, low-fat dairy products, and lean protein.  Do not eat a lot of foods high in solid fats, added sugars, or salt.  Get regular exercise. This is one of the most important things you can do for your health.  Most adults should exercise for at least 150 minutes each week. The exercise should increase your heart rate and make you sweat (moderate-intensity exercise).  Most adults should also do strengthening exercises at least twice a week. This is in addition to the moderate-intensity exercise.  Maintain a healthy weight  Body mass index (BMI) is a measurement that can be used to identify possible weight problems. It estimates body fat based on height and weight. Your health care provider can help determine your BMI and help you achieve or maintain a healthy weight.  For females 69 years of age and older:   A BMI below 18.5 is considered underweight.  A BMI of 18.5 to 24.9 is  normal.  A BMI of 25 to 29.9 is considered overweight.  A BMI of 30 and above is considered obese.  Watch levels of cholesterol and blood lipids  You should start having your blood tested for lipids and cholesterol at 72 years of age, then have this test every 5 years.  You may need to have your cholesterol levels checked more often if:  Your lipid or cholesterol levels are high.  You are older than 72 years of age.  You are at high risk for heart disease.  CANCER SCREENING   Lung Cancer  Lung cancer screening is recommended for adults 102-15 years old who are at high risk for lung cancer because of a history of smoking.  A yearly low-dose CT scan of the lungs is recommended for people who:  Currently smoke.  Have quit within the past 15 years.  Have at least a 30-pack-year history of smoking. A pack year is smoking an average of one pack of cigarettes a day for 1 year.  Yearly screening should continue until it has been 15 years since you quit.  Yearly screening should stop if you develop a health problem that would prevent you from having lung cancer treatment.  Breast Cancer  Practice breast self-awareness. This means understanding how your breasts normally appear and feel.  It also means doing regular breast self-exams. Let your health care provider know about any changes, no matter how small.  If  you are in your 20s or 30s, you should have a clinical breast exam (CBE) by a health care provider every 1-3 years as part of a regular health exam.  If you are 21 or older, have a CBE every year. Also consider having a breast X-ray (mammogram) every year.  If you have a family history of breast cancer, talk to your health care provider about genetic screening.  If you are at high risk for breast cancer, talk to your health care provider about having an MRI and a mammogram every year.  Breast cancer gene (BRCA) assessment is recommended for women who have family members  with BRCA-related cancers. BRCA-related cancers include:  Breast.  Ovarian.  Tubal.  Peritoneal cancers.  Results of the assessment will determine the need for genetic counseling and BRCA1 and BRCA2 testing. Cervical Cancer Your health care provider may recommend that you be screened regularly for cancer of the pelvic organs (ovaries, uterus, and vagina). This screening involves a pelvic examination, including checking for microscopic changes to the surface of your cervix (Pap test). You may be encouraged to have this screening done every 3 years, beginning at age 61.  For women ages 72-65, health care providers may recommend pelvic exams and Pap testing every 3 years, or they may recommend the Pap and pelvic exam, combined with testing for human papilloma virus (HPV), every 5 years. Some types of HPV increase your risk of cervical cancer. Testing for HPV may also be done on women of any age with unclear Pap test results.  Other health care providers may not recommend any screening for nonpregnant women who are considered low risk for pelvic cancer and who do not have symptoms. Ask your health care provider if a screening pelvic exam is right for you.  If you have had past treatment for cervical cancer or a condition that could lead to cancer, you need Pap tests and screening for cancer for at least 20 years after your treatment. If Pap tests have been discontinued, your risk factors (such as having a new sexual partner) need to be reassessed to determine if screening should resume. Some women have medical problems that increase the chance of getting cervical cancer. In these cases, your health care provider may recommend more frequent screening and Pap tests. Colorectal Cancer  This type of cancer can be detected and often prevented.  Routine colorectal cancer screening usually begins at 72 years of age and continues through 72 years of age.  Your health care provider may recommend  screening at an earlier age if you have risk factors for colon cancer.  Your health care provider may also recommend using home test kits to check for hidden blood in the stool.  A small camera at the end of a tube can be used to examine your colon directly (sigmoidoscopy or colonoscopy). This is done to check for the earliest forms of colorectal cancer.  Routine screening usually begins at age 36.  Direct examination of the colon should be repeated every 5-10 years through 72 years of age. However, you may need to be screened more often if early forms of precancerous polyps or small growths are found. Skin Cancer  Check your skin from head to toe regularly.  Tell your health care provider about any new moles or changes in moles, especially if there is a change in a mole's shape or color.  Also tell your health care provider if you have a mole that is larger than the size  of a pencil eraser.  Always use sunscreen. Apply sunscreen liberally and repeatedly throughout the day.  Protect yourself by wearing long sleeves, pants, a wide-brimmed hat, and sunglasses whenever you are outside. HEART DISEASE, DIABETES, AND HIGH BLOOD PRESSURE   High blood pressure causes heart disease and increases the risk of stroke. High blood pressure is more likely to develop in:  People who have blood pressure in the high end of the normal range (130-139/85-89 mm Hg).  People who are overweight or obese.  People who are African American.  If you are 51-70 years of age, have your blood pressure checked every 3-5 years. If you are 43 years of age or older, have your blood pressure checked every year. You should have your blood pressure measured twice--once when you are at a hospital or clinic, and once when you are not at a hospital or clinic. Record the average of the two measurements. To check your blood pressure when you are not at a hospital or clinic, you can use:  An automated blood pressure machine at a  pharmacy.  A home blood pressure monitor.  If you are between 36 years and 66 years old, ask your health care provider if you should take aspirin to prevent strokes.  Have regular diabetes screenings. This involves taking a blood sample to check your fasting blood sugar level.  If you are at a normal weight and have a low risk for diabetes, have this test once every three years after 72 years of age.  If you are overweight and have a high risk for diabetes, consider being tested at a younger age or more often. PREVENTING INFECTION  Hepatitis B  If you have a higher risk for hepatitis B, you should be screened for this virus. You are considered at high risk for hepatitis B if:  You were born in a country where hepatitis B is common. Ask your health care provider which countries are considered high risk.  Your parents were born in a high-risk country, and you have not been immunized against hepatitis B (hepatitis B vaccine).  You have HIV or AIDS.  You use needles to inject street drugs.  You live with someone who has hepatitis B.  You have had sex with someone who has hepatitis B.  You get hemodialysis treatment.  You take certain medicines for conditions, including cancer, organ transplantation, and autoimmune conditions. Hepatitis C  Blood testing is recommended for:  Everyone born from [birth year] through 1963.  Anyone with known risk factors for hepatitis C. Sexually transmitted infections (STIs)  You should be screened for sexually transmitted infections (STIs) including gonorrhea and chlamydia if:  You are sexually active and are younger than 72 years of age.  You are older than 72 years of age and your health care provider tells you that you are at risk for this type of infection.  Your sexual activity has changed since you were last screened and you are at an increased risk for chlamydia or gonorrhea. Ask your health care provider if you are at risk.  If you do not  have HIV, but are at risk, it may be recommended that you take a prescription medicine daily to prevent HIV infection. This is called pre-exposure prophylaxis (PrEP). You are considered at risk if:  You are sexually active and do not regularly use condoms or know the HIV status of your partner(s).  You take drugs by injection.  You are sexually active with a partner who has  HIV. Talk with your health care provider about whether you are at high risk of being infected with HIV. If you choose to begin PrEP, you should first be tested for HIV. You should then be tested every 3 months for as long as you are taking PrEP.  PREGNANCY   If you are premenopausal and you may become pregnant, ask your health care provider about preconception counseling.  If you may become pregnant, take 400 to 800 micrograms (mcg) of folic acid every day.  If you want to prevent pregnancy, talk to your health care provider about birth control (contraception). OSTEOPOROSIS AND MENOPAUSE   Osteoporosis is a disease in which the bones lose minerals and strength with aging. This can result in serious bone fractures. Your risk for osteoporosis can be identified using a bone density scan.  If you are 64 years of age or older, or if you are at risk for osteoporosis and fractures, ask your health care provider if you should be screened.  Ask your health care provider whether you should take a calcium or vitamin D supplement to lower your risk for osteoporosis.  Menopause may have certain physical symptoms and risks.  Hormone replacement therapy may reduce some of these symptoms and risks. Talk to your health care provider about whether hormone replacement therapy is right for you.  HOME CARE INSTRUCTIONS   Schedule regular health, dental, and eye exams.  Stay current with your immunizations.   Do not use any tobacco products including cigarettes, chewing tobacco, or electronic cigarettes.  If you are pregnant, do not  drink alcohol.  If you are breastfeeding, limit how much and how often you drink alcohol.  Limit alcohol intake to no more than 1 drink per day for nonpregnant women. One drink equals 12 ounces of beer, 5 ounces of wine, or 1 ounces of hard liquor.  Do not use street drugs.  Do not share needles.  Ask your health care provider for help if you need support or information about quitting drugs.  Tell your health care provider if you often feel depressed.  Tell your health care provider if you have ever been abused or do not feel safe at home.   This information is not intended to replace advice given to you by your health care provider. Make sure you discuss any questions you have with your health care provider.   Document Released: 10/15/2010 Document Revised: 04/22/2014 Document Reviewed: 03/03/2013 Elsevier Interactive Patient Education [employer].

## 2015-05-29 ENCOUNTER — Ambulatory Visit: Payer: Self-pay | Admitting: Family Medicine

## 2015-06-01 ENCOUNTER — Encounter: Payer: Self-pay | Admitting: Family Medicine

## 2015-06-01 ENCOUNTER — Ambulatory Visit (INDEPENDENT_AMBULATORY_CARE_PROVIDER_SITE_OTHER): Payer: Medicare Other | Admitting: Family Medicine

## 2015-06-01 VITALS — BP 118/62 | HR 92 | Temp 97.5°F | Wt 199.8 lb

## 2015-06-01 DIAGNOSIS — F332 Major depressive disorder, recurrent severe without psychotic features: Secondary | ICD-10-CM

## 2015-06-01 DIAGNOSIS — E039 Hypothyroidism, unspecified: Secondary | ICD-10-CM

## 2015-06-01 LAB — T4, FREE: Free T4: 0.82 ng/dL (ref 0.60–1.60)

## 2015-06-01 LAB — TSH: TSH: 0.68 u[IU]/mL (ref 0.35–4.50)

## 2015-06-01 MED ORDER — ARMODAFINIL 250 MG PO TABS: ORAL_TABLET | ORAL | Status: DC

## 2015-06-01 MED ORDER — BUPROPION HCL 75 MG PO TABS: 75.0000 mg | ORAL_TABLET | Freq: Two times a day (BID) | ORAL | Status: DC

## 2015-06-01 NOTE — Patient Instructions (Addendum)
Labs today. Continue celexa 20mg  daily. Add on wellbutrin 75mg  twice daily.  We will refer you to psychiatrist - pass by our referral coordinators to set this up.  Return in 2-3 months for f/u visit

## 2015-06-01 NOTE — Assessment & Plan Note (Signed)
Recheck thyroid levels today then titrate med accordingly. ?need for T3

## 2015-06-01 NOTE — Assessment & Plan Note (Addendum)
Persistent depression without significant improvement after starting levothyroxine for hypothyroidism. Check TFTs today. Continue celexa 20mg  daily. Add wellbutrin 75mg  BID with slow titration. Has had refractory depression to several antidepressants in the past, did not notice benefit of counseling. Refer to psychiatrist given duration and severity of patient's depression - she agrees with plan. PHQ9 = 19 --> 25, very difficult to function GAD7 = 13

## 2015-06-01 NOTE — Progress Notes (Signed)
BP 118/62 mmHg  Pulse 92  Temp(Src) 97.5 F (36.4 C) (Oral)  Wt 199 lb 12 oz (90.606 kg)   CC: 2 mo f/u visit  Subjective:    Patient ID: Faith Santiago, female    DOB: 1941-01-31, 75 y.o.   MRN: [id number]  HPI: Faith Santiago is a 75 y.o. female presenting on 06/01/2015 for Follow-up   See prior note for details. Recent dx hypothyroidism - started on levothyroxine 2mcg daily. Due for recheck labs. Symptoms included worsening depression, fatigue and other hypothyroid symptoms despite previously normal TFTs. On rpt testing, TSH was mildly elevated but free T4 and T3 were low so we started levothyroxine 29mcg daily. significnat improvement noted after 1.5 wks of thyroid medication. Dry skin has resolved. Initially lost weight, but has regained this.   MDD - on celexa 20mg  daily. She had been taking nortriptyline nightly but didn't notice any improvement. Failed prozac. Persistent marked anhedonia. Feels joyless. She did see counselor Dr Rexene Edison x2-3 but didn't find this was helpful. Remotely on wellbutrin. prozac worked the best but seemed to lose effectiveness.   Relevant past medical, surgical, family and social history reviewed and updated as indicated. Interim medical history since our last visit reviewed. Allergies and medications reviewed and updated. Current Outpatient Prescriptions on File Prior to Visit  Medication Sig  . ALPRAZolam (XANAX) 0.25 MG tablet TAKE 1 TABLET BY MOUTH AT BEDTIME AS NEEDED  . aspirin 81 MG EC tablet Take 81 mg by mouth. Three times weekly  . Cholecalciferol (VITAMIN D) 1000 UNITS capsule Take 1 capsule (1,000 Units total) by mouth daily.  . citalopram (CELEXA) 20 MG tablet Take 1 tablet (20 mg total) by mouth daily.  . diclofenac (VOLTAREN) 75 MG EC tablet Take 75 mg by mouth 2 (two) times daily.  Marland Kitchen levothyroxine (SYNTHROID, LEVOTHROID) 50 MCG tablet Take 1 tablet (50 mcg total) by mouth daily.  . Multiple Vitamin (MULTIVITAMIN) tablet Take 1 tablet by  mouth daily.  . vitamin B-12 (CYANOCOBALAMIN) 1000 MCG tablet Take 1,000 mcg by mouth daily.   No current facility-administered medications on file prior to visit.    Review of Systems Per HPI unless specifically indicated in ROS section     Objective:    BP 118/62 mmHg  Pulse 92  Temp(Src) 97.5 F (36.4 C) (Oral)  Wt 199 lb 12 oz (90.606 kg)  Wt Readings from Last 3 Encounters:  06/01/15 199 lb 12 oz (90.606 kg)  04/07/15 197 lb (89.359 kg)  03/28/15 192 lb (87.091 kg)    Physical Exam  Constitutional: She appears well-developed and well-nourished. No distress.  HENT:  Mouth/Throat: Oropharynx is clear and moist. No oropharyngeal exudate.  Neck: No thyromegaly present.  Cardiovascular: Normal rate, regular rhythm, normal heart sounds and intact distal pulses.   No murmur heard. Pulmonary/Chest: Effort normal and breath sounds normal. No respiratory distress. She has no wheezes. She has no rales.  Lymphadenopathy:    She has no cervical adenopathy.  Skin: Skin is warm and dry. No rash noted.  Psychiatric: Her speech is normal and behavior is normal. She exhibits a depressed mood.  Tearful with discussion of depression and anhedonia  Nursing note and vitals reviewed.  Results for orders placed or performed in visit on 04/03/15  Lipid panel  Result Value Ref Range   Cholesterol 246 (H) 0 - 200 mg/dL   Triglycerides 246.0 (H) 0.0 - 149.0 mg/dL   HDL 74.80 >39.00 mg/dL   VLDL 49.2 (H)  0.0 - 40.0 mg/dL   Total CHOL/HDL Ratio 3    NonHDL [id number]   Basic metabolic panel  Result Value Ref Range   Sodium 138 135 - 145 mEq/L   Potassium 5.3 (H) 3.5 - 5.1 mEq/L   Chloride 104 96 - 112 mEq/L   CO2 26 19 - 32 mEq/L   Glucose, Bld 80 70 - 99 mg/dL   BUN 27 (H) 6 - 23 mg/dL   Creatinine, Ser 0.93 0.40 - 1.20 mg/dL   Calcium 9.0 8.4 - 10.5 mg/dL   GFR 62.89 >60.00 mL/min  VITAMIN D 25 Hydroxy (Vit-D Deficiency, Fractures)  Result Value Ref Range   VITD 25.81 (L) 30.00 -  100.00 ng/mL  TSH  Result Value Ref Range   TSH 4.84 (H) 0.35 - 4.50 uIU/mL  T4, free  Result Value Ref Range   Free T4 0.58 (L) 0.60 - 1.60 ng/dL  T3  Result Value Ref Range   T3, Total 70.0 (L) 80.0 - 204.0 ng/dL  LDL cholesterol, direct  Result Value Ref Range   Direct LDL 136.0 mg/dL      Assessment & Plan:   Problem List Items Addressed This Visit    MDD (major depressive disorder), recurrent episode, severe ([place]) - Primary    Persistent depression without significant improvement after starting levothyroxine for hypothyroidism. Check TFTs today. Continue celexa 20mg  daily. Add wellbutrin 75mg  BID with slow titration. Has had refractory depression to several antidepressants in the past, did not notice benefit of counseling. Refer to psychiatrist given duration and severity of patient's depression - she agrees with plan. PHQ9 = 19 --> 25, very difficult to function GAD7 = 13      Relevant Medications   buPROPion (WELLBUTRIN) 75 MG tablet   Other Relevant Orders   Ambulatory referral to Psychiatry   Hypothyroidism    Recheck thyroid levels today then titrate med accordingly. ?need for T3      Relevant Orders   TSH   T3   T4, free       Follow up plan: Return in about 3 months (around 08/29/2015) for follow up visit.

## 2015-06-01 NOTE — Progress Notes (Signed)
Pre visit review using our clinic review tool, if applicable. No additional management support is needed unless otherwise documented below in the visit note. 

## 2015-06-02 ENCOUNTER — Telehealth: Payer: Self-pay | Admitting: Family Medicine

## 2015-06-02 LAB — T3: T3, Total: 77 ng/dL (ref 76–181)

## 2015-06-02 NOTE — Telephone Encounter (Signed)
Pt placed on [facility] work queue. Pt is aware once records are reviewed, they will call her directly to schedule.

## 2015-06-05 ENCOUNTER — Encounter: Payer: Self-pay | Admitting: *Deleted

## 2015-06-05 ENCOUNTER — Other Ambulatory Visit: Payer: Medicare Other

## 2015-06-05 DIAGNOSIS — Z1211 Encounter for screening for malignant neoplasm of colon: Secondary | ICD-10-CM

## 2015-06-05 LAB — FECAL OCCULT BLOOD, IMMUNOCHEMICAL: FECAL OCCULT BLD: NEGATIVE

## 2015-06-05 LAB — FECAL OCCULT BLOOD, GUAIAC: FECAL OCCULT BLD: NEGATIVE

## 2015-06-07 ENCOUNTER — Other Ambulatory Visit: Payer: Self-pay | Admitting: *Deleted

## 2015-06-07 NOTE — Telephone Encounter (Signed)
Received request from [facility] ( mail order pharmacy) for Xanax. Rx last written on 03/03/15 for # [id number] , last ov was 06/01/15.

## 2015-06-08 MED ORDER — ALPRAZOLAM 0.25 MG PO TABS: ORAL_TABLET | ORAL | Status: DC

## 2015-06-08 NOTE — Telephone Encounter (Signed)
Rx faxed to Mail order pharmacy

## 2015-06-08 NOTE — Telephone Encounter (Signed)
Printed and in Kim[facility] box 

## 2015-07-04 ENCOUNTER — Encounter: Payer: Self-pay | Admitting: Psychiatry

## 2015-07-04 ENCOUNTER — Ambulatory Visit (INDEPENDENT_AMBULATORY_CARE_PROVIDER_SITE_OTHER): Payer: 59 | Admitting: Psychiatry

## 2015-07-04 VITALS — BP 110/64 | HR 106 | Temp 97.6°F | Ht 64.0 in | Wt 200.4 lb

## 2015-07-04 DIAGNOSIS — F331 Major depressive disorder, recurrent, moderate: Secondary | ICD-10-CM

## 2015-07-04 MED ORDER — ARIPIPRAZOLE 2 MG PO TABS: 2.0000 mg | ORAL_TABLET | Freq: Every day | ORAL | Status: DC

## 2015-07-04 MED ORDER — BUPROPION HCL ER (XL) 150 MG PO TB24: 150.0000 mg | ORAL_TABLET | Freq: Every day | ORAL | Status: DC

## 2015-07-04 NOTE — Progress Notes (Signed)
Psychiatric Initial Adult Assessment   Patient Identification: Faith Santiago MRN:  [id number] Date of Evaluation:  07/04/2015 Referral Source: PCP Chief Complaint:   Chief Complaint    Establish Care; Depression     Visit Diagnosis:    ICD-9-CM ICD-10-CM   1. MDD (major depressive disorder), recurrent episode, moderate (HCC) 296.32 F33.1    Diagnosis:   Patient Active Problem List   Diagnosis Date Noted  . Hypothyroidism [E03.9] 04/07/2015  . Vitamin D deficiency [E55.9] 04/02/2015  . Macrocytosis [D75.89] 03/28/2015  . Other fatigue [R53.83] 11/25/2014  . Advanced care planning/counseling discussion [Z71.89] 03/15/2014  . Health maintenance examination [Z00.00] 03/15/2014  . Postsurgical menopause [N95.8] 08/07/2011  . Medicare annual wellness visit, subsequent [Z00.00] 08/06/2011  . Anemia [D64.9] 06/07/2009  . Osteoarthritis [M19.90] 06/07/2009  . Narcolepsy without cataplexy [G47.419] 05/31/2008  . HLD (hyperlipidemia) [E78.5] 06/03/2007  . MDD (major depressive disorder), recurrent episode, severe ([place]) [F33.2] 06/03/2007  . GERD [K21.9] 06/03/2007   History of Present Illness:   Patient is a 75 year old married female who presented for initial assessment. She reported that she has long history of depression and is currently being treated by her primary care physician. Patient reported that she has not seen a psychiatrist in a long period of time. She reported that she feels depressed hopeless helpless and spends a lot of time in her bed. She also has history of narcolepsy and is taking medications for the same. Patient reported that she was recently started on Wellbutrin but it is not helpful. She reported that she has several unfinished projects at home as she does not find motivation to complete those projects. She is in a quilting club and she will not find energy to finish those projects. She currently denied having any suicidal homicidal ideations or plans. She denied  having any perceptual disturbances.   Elements:  Severity:  moderate. Associated Signs/Symptoms: Depression Symptoms:  depressed mood, anhedonia, hypersomnia, psychomotor retardation, fatigue, feelings of worthlessness/guilt, difficulty concentrating, hopelessness, anxiety, loss of energy/fatigue, disturbed sleep, weight gain, increased appetite, (Hypo) Manic Symptoms:  none Anxiety Symptoms:  Excessive Worry, Psychotic Symptoms:  none PTSD Symptoms: Negative NA  Past Medical History:  Past Medical History  Diagnosis Date  . Anxiety   . Arthritis     hands R>L, neck and lower back  . Depression   . Narcolepsy   . Anemia, unspecified   . Esophageal reflux   . HLD (hyperlipidemia)   . History of osteopenia     DEXA WNL 2008, 2015  . Bimalleolar fracture of left ankle 11/19/2013    [facility]  . History of uterine cancer 1999    s/p hysterectomy  . Thyroid disease     Past Surgical History  Procedure Laterality Date  . Bilateral catarct removal  2010  . Colonoscopy  05/2003    WNL (Dr Velora Heckler)  . Total abdominal hysterectomy  1999    uterine cancer  . Orif ankle fracture Left 11/19/2013    Procedure: LEFT ANKLE FRACTURE OPEN TREATMENT BIMALLEOLAR ANKLE INCLUDES INTERNAL FIXATION ;  Surgeon: Johnny Bridge, MD  . Dexa  03/2014    WNL T score +3.9   Family History:  Family History  Problem Relation Age of Onset  . Hypothyroidism Mother   . Stroke Mother     ministroke  . Coronary artery disease Mother 33    s/p some heart surgery  . Cancer Father     prostate  . Diabetes Neg Hx  Social History:   Social History   Social History  . Marital Status: Married    Spouse Name: N/A  . Number of Children: N/A  . Years of Education: N/A   Social History Main Topics  . Smoking status: Never Smoker   . Smokeless tobacco: Never Used  . Alcohol Use: 4.2 - 8.4 oz/week    0 Standard drinks or equivalent, 7-10 Glasses of wine, 0-2 Cans of beer, 0-2 Shots of liquor  per week     Comment: 1 drink /day  . Drug Use: No  . Sexual Activity: No   Other Topics Concern  . 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 Has a son and daughter.  She enjoys quilting and belong to a [employer] group. 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 She moved to [facility] and worked at [place] in [place].  Moved to [place] in 1976. Worked in [employer] and [employer] x 26 years and retired from there.   Musculoskeletal: Strength & Muscle Tone: within normal limits Gait & Station: normal Patient leans: N/A  Psychiatric Specialty Exam: HPI  ROS  Blood pressure 110/64, pulse 106, temperature 97.6 F (36.4 C), temperature source Tympanic, height 5\' 4"  (1.626 m), weight 200 lb 6.4 oz (90.901 kg), SpO2 95 %.Body mass index is 34.38 kg/(m^2).  General Appearance: Casual and Fairly Groomed  Eye Contact:  Fair  Speech:  Clear and Coherent and Slow  Volume:  Decreased  Mood:  Anxious and Depressed  Affect:  Congruent and Depressed  Thought Process:  Goal Directed  Orientation:  Full (Time, Place, and Person)  Thought Content:  WDL  Suicidal Thoughts:  No  Homicidal Thoughts:  No  Memory:  Immediate;   Fair  Judgement:  Fair  Insight:  Fair  Psychomotor Activity:  Normal  Concentration:  Fair  Recall:  [employer] of Knowledge:Fair  Language: Fair  Akathisia:  No  Handed:  Right  AIMS (if indicated):    Assets:  Communication Skills Desire for Improvement Physical Health Social Support  ADL's:  Intact  Cognition: WNL  Sleep:     Is the patient at risk to self?  No. Has the patient been a risk to self in the past 6 months?  No. Has the patient been a risk to self within the  distant past?  No. Is the patient a risk to others?  No. Has the patient been a risk to others in the past 6 months?  No. Has the patient been a risk to others within the distant past?  No.  Allergies:   Allergies  Allergen Reactions  . Ciprofloxacin     REACTION: sun induced rash   Current Medications: Current Outpatient Prescriptions  Medication Sig Dispense Refill  . ALPRAZolam (XANAX) 0.25 MG tablet TAKE 1 TABLET BY MOUTH AT BEDTIME AS NEEDED 90 tablet 0  . Armodafinil (NUVIGIL) 250 MG tablet TAKE 1 TABLET BY MOUTH EVERY DAY 90 tablet 1  . buPROPion (WELLBUTRIN) 75 MG tablet Take 1 tablet (75 mg total) by mouth 2 (two) times daily. 60 tablet 3  . Cholecalciferol (VITAMIN D) 1000 UNITS capsule Take 1 capsule (1,000 Units total)  by mouth daily.    . citalopram (CELEXA) 20 MG tablet Take 1 tablet (20 mg total) by mouth daily. 90 tablet 3  . diclofenac (VOLTAREN) 75 MG EC tablet Take 75 mg by mouth 2 (two) times daily.    Marland Kitchen levothyroxine (SYNTHROID, LEVOTHROID) 50 MCG tablet Take 1 tablet (50 mcg total) by mouth daily. 30 tablet 3  . Multiple Vitamin (MULTIVITAMIN) tablet Take 1 tablet by mouth daily.    . vitamin B-12 (CYANOCOBALAMIN) 1000 MCG tablet Take 1,000 mcg by mouth daily.    Marland Kitchen aspirin 81 MG EC tablet Take 81 mg by mouth. Reported on 07/04/2015     No current facility-administered medications for this visit.    Previous Psychotropic Medications:  Prozac- worked well.  Wellbutrin Alprazolam- many years Celexa - no change   Substance Abuse History in the last 12 months:  No.  Consequences of Substance Abuse: Negative NA  Medical Decision Making:  Review of Psycho-Social Stressors (1)  Treatment Plan Summary: Medication management   Discussed with patient or the medications treatment risks benefits and alternatives I will adjust her medications as follows Increase Wellbutrin XL 150 in the morning Start her on Abilify 2 mg to help as an add-on to her  antidepressant medications Discontinue Celexa Advised her to start taking alprazolam on a when necessary basis She agreed with the plan Patient will follow up in 3 weeks or earlier depending on her symptoms Patient was given instructions about the medication side effects and she demonstrated understanding   More than 50% of the time spent in psychoeducation, counseling and coordination of care.    This note was generated in part or whole with voice recognition software. Voice regonition is usually quite accurate but there are transcription errors that can and very often do occur. I apologize for any typographical errors that were not detected and corrected.     Rainey Pines, MD  3/21/20179:27 AM

## 2015-07-11 ENCOUNTER — Other Ambulatory Visit: Payer: Self-pay | Admitting: Psychiatry

## 2015-07-26 ENCOUNTER — Encounter: Payer: Self-pay | Admitting: Psychiatry

## 2015-07-26 ENCOUNTER — Ambulatory Visit (INDEPENDENT_AMBULATORY_CARE_PROVIDER_SITE_OTHER): Payer: 59 | Admitting: Psychiatry

## 2015-07-26 VITALS — BP 122/62 | HR 115 | Temp 98.1°F | Ht 64.0 in | Wt 198.8 lb

## 2015-07-26 DIAGNOSIS — F331 Major depressive disorder, recurrent, moderate: Secondary | ICD-10-CM

## 2015-07-26 NOTE — Progress Notes (Signed)
Psychiatric MD Progress Note   Patient Identification: Faith Santiago MRN:  [id number] Date of Evaluation:  07/26/2015 Referral Source: PCP Chief Complaint:   Chief Complaint    Follow-up; Medication Refill     Visit Diagnosis:    ICD-9-CM ICD-10-CM   1. MDD (major depressive disorder), recurrent episode, moderate (HCC) 296.32 F33.1    Diagnosis:   Patient Active Problem List   Diagnosis Date Noted  . Hypothyroidism [E03.9] 04/07/2015  . Vitamin D deficiency [E55.9] 04/02/2015  . Macrocytosis [D75.89] 03/28/2015  . Other fatigue [R53.83] 11/25/2014  . Advanced care planning/counseling discussion [Z71.89] 03/15/2014  . Health maintenance examination [Z00.00] 03/15/2014  . Postsurgical menopause [N95.8] 08/07/2011  . Medicare annual wellness visit, subsequent [Z00.00] 08/06/2011  . Anemia [D64.9] 06/07/2009  . Osteoarthritis [M19.90] 06/07/2009  . Narcolepsy without cataplexy [G47.419] 05/31/2008  . HLD (hyperlipidemia) [E78.5] 06/03/2007  . MDD (major depressive disorder), recurrent episode, severe ([place]) [F33.2] 06/03/2007  . GERD [K21.9] 06/03/2007   History of Present Illness:   Patient is a 75 year old married female who presented for follow up.  She reported that she has long history of depression and Has started taking her medications. She reported that she has felt better since the addition of Abilify to her current medications. She reported that she is not having any adverse effects of the medication. Patient reported that she threw up this morning while she was taking shower and felt that she also threw up her medications. She was asking that if she can retake her medications. She appeared more calm and alert. Reported that she was able to go for a trip for almost 5 days. She is also looking forward to the [place] celebrations with her daughter. She reported that she also has history of hoarding and has several books in her house as she has been buying them from the [employer].  Her daughter is going to help her clean the house for the family members celebrating Easter during this coming weekend.  Patient reported that she is feeling motivated and is going to continue her medications as prescribed. She currently denied having any suicidal ideations or plans. She currently denied having any mood swings anger anxiety or paranoia.   Elements:  Severity:  moderate. Associated Signs/Symptoms: Depression Symptoms:  depressed mood, hypersomnia, psychomotor retardation, fatigue, anxiety, loss of energy/fatigue, disturbed sleep, weight gain, increased appetite, (Hypo) Manic Symptoms:  none Anxiety Symptoms:  Excessive Worry, Psychotic Symptoms:  none PTSD Symptoms: Negative NA  Past Medical History:  Past Medical History  Diagnosis Date  . Anxiety   . Arthritis     hands R>L, neck and lower back  . Depression   . Narcolepsy   . Anemia, unspecified   . Esophageal reflux   . HLD (hyperlipidemia)   . History of osteopenia     DEXA WNL 2008, 2015  . Bimalleolar fracture of left ankle 11/19/2013    [facility]  . History of uterine cancer 1999    s/p hysterectomy  . Thyroid disease     Past Surgical History  Procedure Laterality Date  . Bilateral catarct removal  2010  . Colonoscopy  05/2003    WNL (Dr Velora Heckler)  . Total abdominal hysterectomy  1999    uterine cancer  . Orif ankle fracture Left 11/19/2013    Procedure: LEFT ANKLE FRACTURE OPEN TREATMENT BIMALLEOLAR ANKLE INCLUDES INTERNAL FIXATION ;  Surgeon: Johnny Bridge, MD  . Dexa  03/2014    WNL T score +3.9   Family History:  Family History  Problem Relation Age of Onset  . Hypothyroidism Mother   . Stroke Mother     ministroke  . Coronary artery disease Mother 48    s/p some heart surgery  . Cancer Father     prostate  . Diabetes Neg Hx    Social History:   Social History   Social History  . Marital Status: Married    Spouse Name: N/A  . Number of Children: N/A  . Years of Education:  N/A   Social History Main Topics  . Smoking status: Never Smoker   . Smokeless tobacco: Never Used  . Alcohol Use: 4.2 - 8.4 oz/week    0 Standard drinks or equivalent, 7-10 Glasses of wine, 0-2 Cans of beer, 0-2 Shots of liquor per week     Comment: 1 drink /day  . Drug Use: No  . Sexual Activity: No   Other Topics Concern  . 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 Has a son and daughter.  She enjoys quilting and belong to a [place] group. 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 She moved to [facility] and worked at [employer] in [place].  Moved to [place] in 1976. Worked in [employer] and [employer] x 26 years and retired from there.   Musculoskeletal: Strength & Muscle Tone: within normal limits Gait & Station: normal Patient leans: N/A  Psychiatric Specialty Exam: HPI  ROS  Blood pressure 122/62, pulse 115, temperature 98.1 F (36.7 C), temperature source Tympanic, height 5\' 4"  (1.626 m), weight 198 lb 12.8 oz (90.175 kg), SpO2 96 %.Body mass index is 34.11 kg/(m^2).  General Appearance: Casual and Fairly Groomed  Eye Contact:  Fair  Speech:  Clear and Coherent and Slow  Volume:  Normal  Mood:  Anxious and Depressed  Affect:  Congruent and Depressed  Thought Process:  Goal Directed  Orientation:  Full (Time, Place, and Person)  Thought Content:  WDL  Suicidal Thoughts:  No  Homicidal Thoughts:  No  Memory:  Immediate;   Fair  Judgement:  Fair  Insight:  Fair  Psychomotor Activity:  Normal  Concentration:  Fair  Recall:  [employer] of Knowledge:Fair  Language: Fair  Akathisia:  No  Handed:  Right  AIMS (if indicated):    Assets:  Communication Skills Desire for  Improvement Physical Health Social Support  ADL's:  Intact  Cognition: WNL  Sleep:     Is the patient at risk to self?  No. Has the patient been a risk to self in the past 6 months?  No. Has the patient been a risk to self within the distant past?  No. Is the patient a risk to others?  No. Has the patient been a risk to others in the past 6 months?  No. Has the patient been a risk to others within the distant past?  No.  Allergies:   Allergies  Allergen Reactions  . Ciprofloxacin     REACTION: sun induced rash   Current Medications: Current Outpatient Prescriptions  Medication Sig Dispense Refill  . ALPRAZolam (XANAX) 0.25 MG tablet     . ARIPiprazole (ABILIFY) 2 MG tablet Take 1  tablet (2 mg total) by mouth daily. 30 tablet 1  . Armodafinil (NUVIGIL) 250 MG tablet TAKE 1 TABLET BY MOUTH EVERY DAY 90 tablet 1  . aspirin 81 MG EC tablet Take 81 mg by mouth. Reported on 07/04/2015    . buPROPion (WELLBUTRIN XL) 150 MG 24 hr tablet Take 1 tablet (150 mg total) by mouth daily. 30 tablet 0  . Cholecalciferol (VITAMIN D) 1000 UNITS capsule Take 1 capsule (1,000 Units total) by mouth daily.    . diclofenac (VOLTAREN) 75 MG EC tablet Take 75 mg by mouth 2 (two) times daily.    Marland Kitchen levothyroxine (SYNTHROID, LEVOTHROID) 50 MCG tablet Take 1 tablet (50 mcg total) by mouth daily. 30 tablet 3  . Multiple Vitamin (MULTIVITAMIN) tablet Take 1 tablet by mouth daily.    . vitamin B-12 (CYANOCOBALAMIN) 1000 MCG tablet Take 1,000 mcg by mouth daily.     No current facility-administered medications for this visit.    Previous Psychotropic Medications:  Prozac- worked well.  Wellbutrin Alprazolam- many years Celexa - no change   Substance Abuse History in the last 12 months:  No.  Consequences of Substance Abuse: Negative NA  Medical Decision Making:  Review of Psycho-Social Stressors (1)  Treatment Plan Summary: Medication management   Discussed with patient or the medications  treatment risks benefits and alternatives Continue  Wellbutrin XL 150 in the morning Continue  Abilify 2 mg to help as an add-on to her antidepressant medications Advised her to start taking alprazolam on a when necessary basis She agreed with the plan Patient will follow up in 4 weeks or earlier depending on her symptoms Patient was given instructions about the medication side effects and she demonstrated understanding   More than 50% of the time spent in psychoeducation, counseling and coordination of care.    This note was generated in part or whole with voice recognition software. Voice regonition is usually quite accurate but there are transcription errors that can and very often do occur. I apologize for any typographical errors that were not detected and corrected.     Rainey Pines, MD  4/12/201711:28 AM

## 2015-08-23 ENCOUNTER — Ambulatory Visit (INDEPENDENT_AMBULATORY_CARE_PROVIDER_SITE_OTHER): Payer: 59 | Admitting: Psychiatry

## 2015-08-23 ENCOUNTER — Encounter: Payer: Self-pay | Admitting: Psychiatry

## 2015-08-23 VITALS — BP 122/78 | HR 79 | Temp 97.5°F | Ht 64.0 in | Wt 199.4 lb

## 2015-08-23 DIAGNOSIS — F331 Major depressive disorder, recurrent, moderate: Secondary | ICD-10-CM

## 2015-08-23 MED ORDER — ALPRAZOLAM 0.25 MG PO TABS: 0.2500 mg | ORAL_TABLET | Freq: Every evening | ORAL | Status: DC | PRN

## 2015-08-23 MED ORDER — ARIPIPRAZOLE 5 MG PO TABS: 5.0000 mg | ORAL_TABLET | Freq: Every day | ORAL | Status: DC

## 2015-08-23 MED ORDER — BUPROPION HCL 75 MG PO TABS: 75.0000 mg | ORAL_TABLET | Freq: Every morning | ORAL | Status: DC

## 2015-08-23 MED ORDER — VENLAFAXINE HCL ER 75 MG PO CP24: 75.0000 mg | ORAL_CAPSULE | Freq: Two times a day (BID) | ORAL | Status: DC

## 2015-08-23 NOTE — Progress Notes (Signed)
Psychiatric MD Progress Note   Patient Identification: Faith Santiago MRN:  [id number] Date of Evaluation:  08/23/2015 Referral Source: PCP Chief Complaint:    Visit Diagnosis:    ICD-9-CM ICD-10-CM   1. MDD (major depressive disorder), recurrent episode, moderate (HCC) 296.32 F33.1    Diagnosis:   Patient Active Problem List   Diagnosis Date Noted  . Hypothyroidism [E03.9] 04/07/2015  . Vitamin D deficiency [E55.9] 04/02/2015  . Macrocytosis [D75.89] 03/28/2015  . Other fatigue [R53.83] 11/25/2014  . Advanced care planning/counseling discussion [Z71.89] 03/15/2014  . Health maintenance examination [Z00.00] 03/15/2014  . Postsurgical menopause [N95.8] 08/07/2011  . Medicare annual wellness visit, subsequent [Z00.00] 08/06/2011  . Anemia [D64.9] 06/07/2009  . Osteoarthritis [M19.90] 06/07/2009  . Narcolepsy without cataplexy [G47.419] 05/31/2008  . HLD (hyperlipidemia) [E78.5] 06/03/2007  . MDD (major depressive disorder), recurrent episode, severe ([place]) [F33.2] 06/03/2007  . GERD [K21.9] 06/03/2007   History of Present Illness:   Patient is a 75 year old married female who presented for follow up.  Patient was tearful during the interview. She reported that she continues to cry for no reason. She reported that she went to a trip in [place] and was tearful over there when she was thanking the lady. She reported that she has noticed improvement in her symptoms but she continues to have anxiety and depression. Patient reported that she was started on Wellbutrin long time ago. She feels that the Abilify has been helping with her symptoms. She sleeps well at night. She reported that she is not experiencing any side effects of the medications at this time. She currently denied having any suicidal ideations or plans. She is still trying to clean her house and is planning to work on her sewing project.   She reported that she has long history of depression. She reported that she also has  history of hoarding and has several books in her house as she has been buying them from the [place]. Her daughter is going to help her clean the house.  Patient is feeling motivated and is going to continue her medications as prescribed. She currently denied having any suicidal ideations or plans. She currently denied having any mood swings anger anxiety or paranoia.   Elements:  Severity:  moderate. Associated Signs/Symptoms: Depression Symptoms:  depressed mood, hypersomnia, psychomotor retardation, fatigue, anxiety, loss of energy/fatigue, disturbed sleep, weight gain, increased appetite, (Hypo) Manic Symptoms:  none Anxiety Symptoms:  Excessive Worry, Psychotic Symptoms:  none PTSD Symptoms: Negative NA  Past Medical History:  Past Medical History  Diagnosis Date  . Anxiety   . Arthritis     hands R>L, neck and lower back  . Depression   . Narcolepsy   . Anemia, unspecified   . Esophageal reflux   . HLD (hyperlipidemia)   . History of osteopenia     DEXA WNL 2008, 2015  . Bimalleolar fracture of left ankle 11/19/2013    [place]  . History of uterine cancer 1999    s/p hysterectomy  . Thyroid disease     Past Surgical History  Procedure Laterality Date  . Bilateral catarct removal  2010  . Colonoscopy  05/2003    WNL (Dr Velora Heckler)  . Total abdominal hysterectomy  1999    uterine cancer  . Orif ankle fracture Left 11/19/2013    Procedure: LEFT ANKLE FRACTURE OPEN TREATMENT BIMALLEOLAR ANKLE INCLUDES INTERNAL FIXATION ;  Surgeon: Johnny Bridge, MD  . Dexa  03/2014    WNL T score +3.9  Family History:  Family History  Problem Relation Age of Onset  . Hypothyroidism Mother   . Stroke Mother     ministroke  . Coronary artery disease Mother 31    s/p some heart surgery  . Cancer Father     prostate  . Diabetes Neg Hx    Social History:   Social History   Social History  . Marital Status: Married    Spouse Name: N/A  . Number of Children: N/A  . Years  of Education: N/A   Social History Main Topics  . Smoking status: Never Smoker   . Smokeless tobacco: Never Used  . Alcohol Use: 4.2 - 8.4 oz/week    0 Standard drinks or equivalent, 7-10 Glasses of wine, 0-2 Cans of beer, 0-2 Shots of liquor per week     Comment: 1 drink /day  . Drug Use: No  . Sexual Activity: No   Other Topics Concern  . 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 Has a son and daughter.  She enjoys quilting and belong to a [place] group. 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 She moved to [facility] and worked at [place] in [place].  Moved to [place] in 1976. Worked in [employer] and [employer] x 26 years and retired from there.   Musculoskeletal: Strength & Muscle Tone: within normal limits Gait & Station: normal Patient leans: N/A  Psychiatric Specialty Exam: HPI  ROS  There were no vitals taken for this visit.There is no weight on file to calculate BMI.  General Appearance: Casual and Fairly Groomed  Eye Contact:  Fair  Speech:  Clear and Coherent and Slow  Volume:  Normal  Mood:  Anxious and Depressed  Affect:  Congruent and Depressed  Thought Process:  Goal Directed  Orientation:  Full (Time, Place, and Person)  Thought Content:  WDL  Suicidal Thoughts:  No  Homicidal Thoughts:  No  Memory:  Immediate;   Fair  Judgement:  Fair  Insight:  Fair  Psychomotor Activity:  Normal  Concentration:  Fair  Recall:  [employer] of Knowledge:Fair  Language: Fair  Akathisia:  No  Handed:  Right  AIMS (if indicated):    Assets:  Communication Skills Desire for Improvement Physical Health Social Support  ADL's:  Intact  Cognition: WNL  Sleep:     Is the  patient at risk to self?  No. Has the patient been a risk to self in the past 6 months?  No. Has the patient been a risk to self within the distant past?  No. Is the patient a risk to others?  No. Has the patient been a risk to others in the past 6 months?  No. Has the patient been a risk to others within the distant past?  No.  Allergies:   Allergies  Allergen Reactions  . Ciprofloxacin     REACTION: sun induced rash   Current Medications: Current Outpatient Prescriptions  Medication Sig Dispense Refill  . ALPRAZolam (XANAX) 0.25 MG tablet     . ARIPiprazole (ABILIFY) 2 MG tablet Take 1 tablet (2 mg total) by mouth daily. 30 tablet 1  .  Armodafinil (NUVIGIL) 250 MG tablet TAKE 1 TABLET BY MOUTH EVERY DAY 90 tablet 1  . aspirin 81 MG EC tablet Take 81 mg by mouth. Reported on 07/04/2015    . buPROPion (WELLBUTRIN XL) 150 MG 24 hr tablet Take 1 tablet (150 mg total) by mouth daily. 30 tablet 0  . Cholecalciferol (VITAMIN D) 1000 UNITS capsule Take 1 capsule (1,000 Units total) by mouth daily.    . diclofenac (VOLTAREN) 75 MG EC tablet Take 75 mg by mouth 2 (two) times daily.    Marland Kitchen levothyroxine (SYNTHROID, LEVOTHROID) 50 MCG tablet Take 1 tablet (50 mcg total) by mouth daily. 30 tablet 3  . Multiple Vitamin (MULTIVITAMIN) tablet Take 1 tablet by mouth daily.    . vitamin B-12 (CYANOCOBALAMIN) 1000 MCG tablet Take 1,000 mcg by mouth daily.     No current facility-administered medications for this visit.    Previous Psychotropic Medications:  Prozac- worked well.  Wellbutrin Alprazolam- many years Celexa - no change   Substance Abuse History in the last 12 months:  No.  Consequences of Substance Abuse: Negative NA  Medical Decision Making:  Review of Psycho-Social Stressors (1)  Treatment Plan Summary: Medication management   Discussed with patient or the medications treatment risks benefits and alternatives Continue  Wellbutrin 75 mg in am x 2 weeks and then DC.   Continue  Abilify 5 mg to help as an add-on to her antidepressant medications I will start her on Effexor XR 75 mg in the morning. She will titrate the dose to 2 weeks 250 mg. I also advised her to decrease the dose of Armodafinil  to 125 mg and she demonstrated understanding Advised her to start taking alprazolam on a when necessary basis She agreed with the plan Patient will follow up in 4 weeks or earlier depending on her symptoms Patient was given instructions about the medication side effects and she demonstrated understanding   More than 50% of the time spent in psychoeducation, counseling and coordination of care.    This note was generated in part or whole with voice recognition software. Voice regonition is usually quite accurate but there are transcription errors that can and very often do occur. I apologize for any typographical errors that were not detected and corrected.     Rainey Pines, MD  5/10/201710:33 AM

## 2015-09-04 ENCOUNTER — Other Ambulatory Visit: Payer: Self-pay

## 2015-09-04 MED ORDER — LEVOTHYROXINE SODIUM 50 MCG PO TABS: 50.0000 ug | ORAL_TABLET | Freq: Every day | ORAL | Status: DC

## 2015-09-04 NOTE — Telephone Encounter (Signed)
Vicente Males from [facility] called stating pt request 90 day supply for Levothyroxine---review last lab result note from Dr G---Rx sent to pharmacy as requested

## 2015-09-20 ENCOUNTER — Ambulatory Visit (INDEPENDENT_AMBULATORY_CARE_PROVIDER_SITE_OTHER): Payer: Medicare Other | Admitting: Psychiatry

## 2015-09-20 ENCOUNTER — Other Ambulatory Visit: Payer: Self-pay

## 2015-09-20 VITALS — BP 123/80 | HR 87 | Temp 97.1°F | Resp 16 | Ht 64.0 in | Wt 193.4 lb

## 2015-09-20 DIAGNOSIS — F331 Major depressive disorder, recurrent, moderate: Secondary | ICD-10-CM

## 2015-09-20 MED ORDER — ARMODAFINIL 150 MG PO TABS: 150.0000 mg | ORAL_TABLET | Freq: Every day | ORAL | Status: DC

## 2015-09-20 MED ORDER — ARIPIPRAZOLE 5 MG PO TABS: 5.0000 mg | ORAL_TABLET | Freq: Every day | ORAL | Status: DC

## 2015-09-20 MED ORDER — VENLAFAXINE HCL ER 75 MG PO CP24: 75.0000 mg | ORAL_CAPSULE | Freq: Every day | ORAL | Status: DC

## 2015-09-20 NOTE — Progress Notes (Signed)
Psychiatric MD Progress Note   Patient Identification: Faith Santiago MRN:  [id number] Date of Evaluation:  09/20/2015 Referral Source: PCP Chief Complaint:    Visit Diagnosis:    ICD-9-CM ICD-10-CM   1. MDD (major depressive disorder), recurrent episode, moderate (HCC) 296.32 F33.1    Diagnosis:   Patient Active Problem List   Diagnosis Date Noted  . Hypothyroidism [E03.9] 04/07/2015  . Vitamin D deficiency [E55.9] 04/02/2015  . Macrocytosis [D75.89] 03/28/2015  . Other fatigue [R53.83] 11/25/2014  . Advanced care planning/counseling discussion [Z71.89] 03/15/2014  . Health maintenance examination [Z00.00] 03/15/2014  . Postsurgical menopause [N95.8] 08/07/2011  . Medicare annual wellness visit, subsequent [Z00.00] 08/06/2011  . Anemia [D64.9] 06/07/2009  . Osteoarthritis [M19.90] 06/07/2009  . Narcolepsy without cataplexy [G47.419] 05/31/2008  . HLD (hyperlipidemia) [E78.5] 06/03/2007  . MDD (major depressive disorder), recurrent episode, severe ([place]) [F33.2] 06/03/2007  . GERD [K21.9] 06/03/2007   History of Present Illness:   Patient is a 75 year old married female who presented for follow up.  Patient Reported that she has started improving on her medication. She reported that she has felt some improvement in her depressive symptoms and she is doing well. Denied having any worsening of her depression and she has successfully stopped the Wellbutrin. Patient reported that she has more energy and she is taking Abilify 5 mg in the morning. She is sleeping well at night. Patient reported that she is able to do some household chores as she has been a longtime order and was collecting stuff at her house. Patient reported that she is planning to go with her husband for a trip and will be enjoying her time. She is still taking the Nuvigil in the morning but has decrease the dose. Patient reported that she is is not taking the Xanax on a regular basis. She was very happy about the changes in  her medications. She currently denied having any suicidal ideations or plans. She denied having any perceptual disturbances.   She appeared calm and collective during the interview.  Elements:  Severity:  moderate. Associated Signs/Symptoms: Depression Symptoms:  depressed mood, fatigue, anxiety, loss of energy/fatigue, (Hypo) Manic Symptoms:  none Anxiety Symptoms:  Excessive Worry, Psychotic Symptoms:  none PTSD Symptoms: Negative NA  Past Medical History:  Past Medical History  Diagnosis Date  . Anxiety   . Arthritis     hands R>L, neck and lower back  . Depression   . Narcolepsy   . Anemia, unspecified   . Esophageal reflux   . HLD (hyperlipidemia)   . History of osteopenia     DEXA WNL 2008, 2015  . Bimalleolar fracture of left ankle 11/19/2013    [facility]  . History of uterine cancer 1999    s/p hysterectomy  . Thyroid disease     Past Surgical History  Procedure Laterality Date  . Bilateral catarct removal  2010  . Colonoscopy  05/2003    WNL (Dr Velora Heckler)  . Total abdominal hysterectomy  1999    uterine cancer  . Orif ankle fracture Left 11/19/2013    Procedure: LEFT ANKLE FRACTURE OPEN TREATMENT BIMALLEOLAR ANKLE INCLUDES INTERNAL FIXATION ;  Surgeon: Johnny Bridge, MD  . Dexa  03/2014    WNL T score +3.9   Family History:  Family History  Problem Relation Age of Onset  . Hypothyroidism Mother   . Stroke Mother     ministroke  . Coronary artery disease Mother 65    s/p some heart surgery  .  Cancer Father     prostate  . Diabetes Neg Hx    Social History:   Social History   Social History  . Marital Status: Married    Spouse Name: N/A  . Number of Children: N/A  . Years of Education: N/A   Social History Main Topics  . Smoking status: Never Smoker   . Smokeless tobacco: Never Used  . Alcohol Use: 4.2 - 8.4 oz/week    0 Standard drinks or equivalent, 7-10 Glasses of wine, 0-2 Cans of beer, 0-2 Shots of liquor per week     Comment: 1 drink /day   . Drug Use: No  . Sexual Activity: No   Other Topics Concern  . 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 Has a son and daughter.  She enjoys quilting and belong to a [place] group. 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 She moved to [facility] and worked at [employer] [place] in [place].  Moved to [place] in 1976. Worked in [employer] and [employer] x 26 years and retired from there.   Musculoskeletal: Strength & Muscle Tone: within normal limits Gait & Station: normal Patient leans: N/A  Psychiatric Specialty Exam: HPI  ROS  There were no vitals taken for this visit.There is no weight on file to calculate BMI.  General Appearance: Casual and Fairly Groomed  Eye Contact:  Fair  Speech:  Clear and Coherent and Slow  Volume:  Normal  Mood:  Depressed  Affect:  Congruent and Depressed  Thought Process:  Goal Directed  Orientation:  Full (Time, Place, and Person)  Thought Content:  WDL  Suicidal Thoughts:  No  Homicidal Thoughts:  No  Memory:  Immediate;   Fair  Judgement:  Fair  Insight:  Fair  Psychomotor Activity:  Normal  Concentration:  Fair  Recall:  [employer] of Knowledge:Fair  Language: Fair  Akathisia:  No  Handed:  Right  AIMS (if indicated):    Assets:  Communication Skills Desire for Improvement Physical Health Social Support  ADL's:  Intact  Cognition: WNL  Sleep:     Is the patient at risk to self?  No. Has the patient been a risk to self in the past 6 months?  No. Has the patient been a risk to self within the distant past?  No. Is the patient a risk to others?  No. Has the patient been a risk to others in the past 6 months?  No. Has the patient been a risk to  others within the distant past?  No.  Allergies:   Allergies  Allergen Reactions  . Ciprofloxacin     REACTION: sun induced rash   Current Medications: Current Outpatient Prescriptions  Medication Sig Dispense Refill  . ALPRAZolam (XANAX) 0.25 MG tablet Take 1 tablet (0.25 mg total) by mouth at bedtime as needed for anxiety. 30 tablet 0  . ARIPiprazole (ABILIFY) 5 MG tablet Take 1 tablet (5 mg total) by mouth daily. 30 tablet 1  . Armodafinil (NUVIGIL) 250 MG tablet TAKE 1 TABLET BY MOUTH EVERY DAY 90 tablet 1  . aspirin 81 MG EC tablet Take 81 mg by mouth. Reported on  07/04/2015    . buPROPion (WELLBUTRIN) 75 MG tablet Take 1 tablet (75 mg total) by mouth every morning. 15 tablet 0  . Cholecalciferol (VITAMIN D) 1000 UNITS capsule Take 1 capsule (1,000 Units total) by mouth daily.    . diclofenac (VOLTAREN) 75 MG EC tablet Take 75 mg by mouth 2 (two) times daily.    Marland Kitchen levothyroxine (SYNTHROID, LEVOTHROID) 50 MCG tablet Take 1 tablet (50 mcg total) by mouth daily. 90 tablet 1  . Multiple Vitamin (MULTIVITAMIN) tablet Take 1 tablet by mouth daily.    Marland Kitchen venlafaxine XR (EFFEXOR XR) 75 MG 24 hr capsule Take 1 capsule (75 mg total) by mouth 2 (two) times daily. 60 capsule 0  . vitamin B-12 (CYANOCOBALAMIN) 1000 MCG tablet Take 1,000 mcg by mouth daily.     No current facility-administered medications for this visit.    Previous Psychotropic Medications:  Prozac- worked well.  Wellbutrin Alprazolam- many years Celexa - no change   Substance Abuse History in the last 12 months:  No.  Consequences of Substance Abuse: Negative NA  Medical Decision Making:  Review of Psycho-Social Stressors (1)  Treatment Plan Summary: Medication management   Discussed with patient or the medications treatment risks benefits and alternatives Continue  Abilify 5 mg to help as an add-on to her antidepressant medications I will start her on Effexor XR 75 mg in the morning.  I also advised her to  decrease the dose of Armodafinil  to 150  mg and take 1/2 pill daily. she demonstrated understanding Advised her to start taking alprazolam on a when necessary basis She agreed with the plan Patient will follow up in 6 weeks or earlier depending on her symptoms Patient was given instructions about the medication side effects and she demonstrated understanding   More than 50% of the time spent in psychoeducation, counseling and coordination of care.    This note was generated in part or whole with voice recognition software. Voice regonition is usually quite accurate but there are transcription errors that can and very often do occur. I apologize for any typographical errors that were not detected and corrected.     Rainey Pines, MD  6/7/201710:52 AM

## 2015-09-28 ENCOUNTER — Telehealth: Payer: Self-pay

## 2015-09-28 NOTE — Telephone Encounter (Signed)
rx was faxed and confirmed on rx armodafinil 150mg  id # [id number] order # [id number]

## 2015-11-01 ENCOUNTER — Ambulatory Visit: Payer: Medicare Other | Admitting: Psychiatry

## 2015-11-25 ENCOUNTER — Other Ambulatory Visit: Payer: Self-pay | Admitting: Psychiatry

## 2015-11-26 ENCOUNTER — Other Ambulatory Visit: Payer: Self-pay | Admitting: Psychiatry

## 2015-11-27 NOTE — Telephone Encounter (Signed)
Pt need appointment

## 2016-01-01 ENCOUNTER — Ambulatory Visit: Payer: Medicare Other | Admitting: Psychiatry

## 2016-01-19 ENCOUNTER — Ambulatory Visit (INDEPENDENT_AMBULATORY_CARE_PROVIDER_SITE_OTHER): Payer: 59 | Admitting: Psychiatry

## 2016-01-19 ENCOUNTER — Encounter: Payer: Self-pay | Admitting: Psychiatry

## 2016-01-19 VITALS — BP 93/61 | HR 94 | Temp 97.7°F | Wt 191.4 lb

## 2016-01-19 DIAGNOSIS — F331 Major depressive disorder, recurrent, moderate: Secondary | ICD-10-CM

## 2016-01-19 MED ORDER — VENLAFAXINE HCL ER 75 MG PO CP24: 75.0000 mg | ORAL_CAPSULE | Freq: Every day | ORAL | 1 refills | Status: DC

## 2016-01-19 MED ORDER — ARIPIPRAZOLE 5 MG PO TABS: 5.0000 mg | ORAL_TABLET | Freq: Every day | ORAL | 1 refills | Status: DC

## 2016-01-19 MED ORDER — ARMODAFINIL 150 MG PO TABS: 150.0000 mg | ORAL_TABLET | Freq: Every day | ORAL | 1 refills | Status: DC

## 2016-01-19 MED ORDER — ALPRAZOLAM 0.25 MG PO TABS: 0.2500 mg | ORAL_TABLET | Freq: Every evening | ORAL | 1 refills | Status: DC | PRN

## 2016-01-19 NOTE — Progress Notes (Signed)
Psychiatric MD Progress Note   Patient Identification: Faith Santiago MRN:  [id number] Date of Evaluation:  01/19/2016 Referral Source: PCP Chief Complaint:   Chief Complaint    Medication Refill; Follow-up     Visit Diagnosis:    ICD-9-CM ICD-10-CM   1. MDD (major depressive disorder), recurrent episode, moderate (HCC) 296.32 F33.1    Diagnosis:   Patient Active Problem List   Diagnosis Date Noted  . Hypothyroidism [E03.9] 04/07/2015  . Vitamin D deficiency [E55.9] 04/02/2015  . Macrocytosis [D75.89] 03/28/2015  . Other fatigue [R53.83] 11/25/2014  . Advanced care planning/counseling discussion [Z71.89] 03/15/2014  . Health maintenance examination [Z00.00] 03/15/2014  . Postsurgical menopause [E89.40] 08/07/2011  . Medicare annual wellness visit, subsequent [Z00.00] 08/06/2011  . Anemia [D64.9] 06/07/2009  . Osteoarthritis [M19.90] 06/07/2009  . Narcolepsy without cataplexy(347.00) [G47.419] 05/31/2008  . HLD (hyperlipidemia) [E78.5] 06/03/2007  . MDD (major depressive disorder), recurrent episode, severe (HCC) [F33.2] 06/03/2007  . GERD [K21.9] 06/03/2007   History of Present Illness:   Patient is a 75 year old married female who presented for follow up.  Patient Stated that she was very busy after she returned back from her care from [place]. Her husband got sick and had the open heart surgery last month. She is helping him as he has been doing his surgery. She is the designated driver and has to take him 4 different appointments. Patient reported that she ran out of her medications and was not given any refills to the pharmacy. She did not call the office to get the refills. Patient reported that she has been out her Effexor XR for almost a month and has been running out of her other medications as well. She wants a refill of the medications at this time. Patient currently denied having any suicidal ideations or plans. She appeared calm and collected during the interview.     Discussed about her medications in detail. Patient is sleeping well at this time.    She currently denied having any suicidal ideations or plans. She denied having any perceptual disturbances.   She appeared calm and collective during the interview.  Elements:  Severity:  moderate. Associated Signs/Symptoms: Depression Symptoms:  depressed mood, fatigue, anxiety, loss of energy/fatigue, (Hypo) Manic Symptoms:  none Anxiety Symptoms:  Excessive Worry, Psychotic Symptoms:  none PTSD Symptoms: Negative NA  Past Medical History:  Past Medical History:  Diagnosis Date  . Anemia, unspecified   . Anxiety   . Arthritis    hands R>L, neck and lower back  . Bimalleolar fracture of left ankle 11/19/2013   [facility]  . Depression   . Esophageal reflux   . History of osteopenia    DEXA WNL 2008, 2015  . History of uterine cancer 1999   s/p hysterectomy  . HLD (hyperlipidemia)   . Narcolepsy   . Thyroid disease     Past Surgical History:  Procedure Laterality Date  . bilateral catarct removal  2010  . COLONOSCOPY  05/2003   WNL (Dr Corinda GublerLeBauer)  . DEXA  03/2014   WNL T score +3.9  . ORIF ANKLE FRACTURE Left 11/19/2013   Procedure: LEFT ANKLE FRACTURE OPEN TREATMENT BIMALLEOLAR ANKLE INCLUDES INTERNAL FIXATION ;  Surgeon: Eulas PostJoshua P [place], MD  . TOTAL ABDOMINAL HYSTERECTOMY  1999   uterine cancer   Family History:  Family History  Problem Relation Age of Onset  . Hypothyroidism Mother   . Stroke Mother     ministroke  . Coronary artery disease Mother [id number]  s/p some heart surgery  . Cancer Father     prostate  . Diabetes Neg Hx    Social History:   Social History   Social History  . Marital status: Married    Spouse name: N/A  . Number of children: N/A  . Years of education: N/A   Social History Main Topics  . Smoking status: Never Smoker  . Smokeless tobacco: Never Used  . Alcohol use 4.2 - 8.4 oz/week    7 - 10 Glasses of wine per week     Comment: 1 drink /day   . Drug use: No  . Sexual activity: No   Other Topics Concern  . 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 Has a son and daughter.  She enjoys quilting and belong to a [place] group. 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 She moved to [facility] and worked at [employer] in [place].  Moved to [place] in 1976. Worked in [employer] and [employer] x 26 years and retired from there.   Musculoskeletal: Strength & Muscle Tone: within normal limits Gait & Station: normal Patient leans: N/A  Psychiatric Specialty Exam: HPI  ROS  Blood pressure 93/61, pulse 94, temperature 97.7 F (36.5 C), temperature source Oral, weight 191 lb 6.4 oz (86.8 kg).Body mass index is 32.85 kg/m.  General Appearance: Casual and Fairly Groomed  Eye Contact:  Fair  Speech:  Clear and Coherent and Slow  Volume:  Normal  Mood:  Depressed  Affect:  Congruent and Depressed  Thought Process:  Goal Directed  Orientation:  Full (Time, Place, and Person)  Thought Content:  WDL  Suicidal Thoughts:  No  Homicidal Thoughts:  No  Memory:  Immediate;   Fair  Judgement:  Fair  Insight:  Fair  Psychomotor Activity:  Normal  Concentration:  Fair  Recall:  [employer] of Knowledge:Fair  Language: Fair  Akathisia:  No  Handed:  Right  AIMS (if indicated):    Assets:  Communication Skills Desire for Improvement Physical Health Social Support  ADL's:  Intact  Cognition: WNL  Sleep:     Is the patient at risk to self?  No. Has the patient been a risk to self in the past 6 months?  No. Has the patient been a risk to self within the distant past?  No. Is the patient a risk to others?  No. Has the patient been a risk to others in  the past 6 months?  No. Has the patient been a risk to others within the distant past?  No.  Allergies:   Allergies  Allergen Reactions  . Ciprofloxacin     REACTION: sun induced rash   Current Medications: Current Outpatient Prescriptions  Medication Sig Dispense Refill  . ALPRAZolam (XANAX) 0.25 MG tablet Take 1 tablet (0.25 mg total) by mouth at bedtime as needed for anxiety. 90 tablet 1  . ARIPiprazole (ABILIFY) 5 MG tablet Take 1 tablet (5 mg total) by mouth daily. 90 tablet 1  . Armodafinil 150 MG tablet Take 1 tablet (150 mg total) by mouth daily. 90 tablet 1  . aspirin 81 MG EC tablet Take 81 mg by mouth.  Reported on 07/04/2015    . Cholecalciferol (VITAMIN D) 1000 UNITS capsule Take 1 capsule (1,000 Units total) by mouth daily.    . diclofenac (VOLTAREN) 75 MG EC tablet Take 75 mg by mouth 2 (two) times daily.    Marland Kitchen levothyroxine (SYNTHROID, LEVOTHROID) 50 MCG tablet Take 1 tablet (50 mcg total) by mouth daily. 90 tablet 1  . Multiple Vitamin (MULTIVITAMIN) tablet Take 1 tablet by mouth daily.    Marland Kitchen venlafaxine XR (EFFEXOR XR) 75 MG 24 hr capsule Take 1 capsule (75 mg total) by mouth daily with breakfast. 90 capsule 1  . vitamin B-12 (CYANOCOBALAMIN) 1000 MCG tablet Take 1,000 mcg by mouth daily.     No current facility-administered medications for this visit.     Previous Psychotropic Medications:  Prozac- worked well.  Wellbutrin Alprazolam- many years Celexa - no change   Substance Abuse History in the last 12 months:  No.  Consequences of Substance Abuse: Negative NA  Medical Decision Making:  Review of Psycho-Social Stressors (1)  Treatment Plan Summary: Medication management   Discussed with patient or the medications treatment risks benefits and alternatives Continue  Abilify 5 mg to help as an add-on to her antidepressant medications Continue  Effexor XR 75 mg in the morning.  I also advised her to decrease the dose of Armodafinil  to 150  mg and take 1/2  pill daily. she demonstrated understanding Advised her to start taking alprazolam on a when necessary basis Medication  refill for the 90 day supply. She agreed with the plan Patient will follow up in 8  weeks or earlier depending on her symptoms    More than 50% of the time spent in psychoeducation, counseling and coordination of care.    This note was generated in part or whole with voice recognition software. Voice regonition is usually quite accurate but there are transcription errors that can and very often do occur. I apologize for any typographical errors that were not detected and corrected.     Rainey Pines, MD  10/6/201711:41 AM

## 2016-01-29 ENCOUNTER — Telehealth: Payer: Self-pay

## 2016-01-29 NOTE — Telephone Encounter (Signed)
prior authorization was approved for medications armodafinil  approved on pa [id number] from  01-26-16 to  02-12-2017.

## 2016-02-21 ENCOUNTER — Other Ambulatory Visit: Payer: Self-pay | Admitting: Family Medicine

## 2016-03-12 ENCOUNTER — Ambulatory Visit (INDEPENDENT_AMBULATORY_CARE_PROVIDER_SITE_OTHER): Payer: 59 | Admitting: Psychiatry

## 2016-03-12 VITALS — BP 144/81 | HR 85 | Temp 97.6°F | Resp 16 | Wt 195.6 lb

## 2016-03-12 DIAGNOSIS — F331 Major depressive disorder, recurrent, moderate: Secondary | ICD-10-CM | POA: Diagnosis not present

## 2016-03-12 NOTE — Progress Notes (Signed)
Psychiatric MD Progress Note   Patient Identification: Faith Santiago MRN:  [id number] Date of Evaluation:  03/12/2016 Referral Source: PCP Chief Complaint:   Chief Complaint    Follow-up     Visit Diagnosis:    ICD-9-CM ICD-10-CM   1. MDD (major depressive disorder), recurrent episode, moderate (HCC) 296.32 F33.1    Diagnosis:   Patient Active Problem List   Diagnosis Date Noted  . Hypothyroidism [E03.9] 04/07/2015  . Vitamin D deficiency [E55.9] 04/02/2015  . Macrocytosis [D75.89] 03/28/2015  . Other fatigue [R53.83] 11/25/2014  . Advanced care planning/counseling discussion [Z71.89] 03/15/2014  . Health maintenance examination [Z00.00] 03/15/2014  . Postsurgical menopause [E89.40] 08/07/2011  . Medicare annual wellness visit, subsequent [Z00.00] 08/06/2011  . Anemia [D64.9] 06/07/2009  . Osteoarthritis [M19.90] 06/07/2009  . Narcolepsy without cataplexy(347.00) [G47.419] 05/31/2008  . HLD (hyperlipidemia) [E78.5] 06/03/2007  . MDD (major depressive disorder), recurrent episode, severe ([facility]) [F33.2] 06/03/2007  . GERD [K21.9] 06/03/2007   History of Present Illness:   Patient is a 75 year old married female who presented for follow up.  Patient Reported that she enjoyed her Thanksgiving with her family and went to [place]. Patient stated that she has been compliant with her medications. She stated that she has noticed improvement in her symptoms. She was concerned about her husband and we discussed about that in length. She reported that she has been trying to encourage him to start doing some exercise. Patient reported that she feels her effexor  is helping her and she wants to go higher on the dose of the medications. She currently denied having any side effects of the medications. She appeared more alert during the interview. She has been sleeping well at night. She takes Xanax on a when necessary basis. She has enough supply of the medications at this time.  Patient currently  denied having any suicidal ideations or plans. She appeared calm and collected during the interview.    Discussed about her medications in detail. Patient is sleeping well at this time.   She appeared calm and collective during the interview.  Elements:  Severity:  moderate. Associated Signs/Symptoms: Depression Symptoms:  depressed mood, fatigue, anxiety, loss of energy/fatigue, (Hypo) Manic Symptoms:  none Anxiety Symptoms:  Excessive Worry, Psychotic Symptoms:  none PTSD Symptoms: Negative NA  Past Medical History:  Past Medical History:  Diagnosis Date  . Anemia, unspecified   . Anxiety   . Arthritis    hands R>L, neck and lower back  . Bimalleolar fracture of left ankle 11/19/2013   [facility]  . Depression   . Esophageal reflux   . History of osteopenia    DEXA WNL 2008, 2015  . History of uterine cancer 1999   s/p hysterectomy  . HLD (hyperlipidemia)   . Narcolepsy   . Thyroid disease     Past Surgical History:  Procedure Laterality Date  . bilateral catarct removal  2010  . COLONOSCOPY  05/2003   WNL (Dr Velora Heckler)  . DEXA  03/2014   WNL T score +3.9  . ORIF ANKLE FRACTURE Left 11/19/2013   Procedure: LEFT ANKLE FRACTURE OPEN TREATMENT BIMALLEOLAR ANKLE INCLUDES INTERNAL FIXATION ;  Surgeon: Johnny Bridge, MD  . TOTAL ABDOMINAL HYSTERECTOMY  1999   uterine cancer   Family History:  Family History  Problem Relation Age of Onset  . Hypothyroidism Mother   . Stroke Mother     ministroke  . Coronary artery disease Mother 65    s/p some heart surgery  .  Cancer Father     prostate  . Diabetes Neg Hx    Social History:   Social History   Social History  . Marital status: Married    Spouse name: N/A  . Number of children: N/A  . Years of education: N/A   Social History Main Topics  . Smoking status: Never Smoker  . Smokeless tobacco: Never Used  . Alcohol use 4.2 - 8.4 oz/week    7 - 10 Glasses of wine per week     Comment: 1 drink /day  . Drug use:  No  . Sexual activity: No   Other Topics Concern  . 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 Has a son and daughter.  She enjoys quilting and belong to a [employer] group. 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 She moved to [facility] and worked at [employer] in [place].  Moved to [place] in 1976. Worked in [employer] and [employer] x 26 years and retired from there.   Musculoskeletal: Strength & Muscle Tone: within normal limits Gait & Station: normal Patient leans: N/A  Psychiatric Specialty Exam: HPI  ROS  Blood pressure (!) 144/81, pulse 85, temperature 97.6 F (36.4 C), temperature source Tympanic, resp. rate 16, weight 195 lb 9.6 oz (88.7 kg), SpO2 94 %.Body mass index is 33.57 kg/m.  General Appearance: Casual and Fairly Groomed  Eye Contact:  Fair  Speech:  Clear and Coherent and Slow  Volume:  Normal  Mood:  Depressed  Affect:  Congruent and Depressed  Thought Process:  Goal Directed  Orientation:  Full (Time, Place, and Person)  Thought Content:  WDL  Suicidal Thoughts:  No  Homicidal Thoughts:  No  Memory:  Immediate;   Fair  Judgement:  Fair  Insight:  Fair  Psychomotor Activity:  Normal  Concentration:  Fair  Recall:  [employer] of Knowledge:Fair  Language: Fair  Akathisia:  No  Handed:  Right  AIMS (if indicated):    Assets:  Communication Skills Desire for Improvement Physical Health Social Support  ADL's:  Intact  Cognition: WNL  Sleep:     Is the patient at risk to self?  No. Has the patient been a risk to self in the past 6 months?  No. Has the patient been a risk to self within the distant past?  No. Is the patient a risk to others?  No. Has the  patient been a risk to others in the past 6 months?  No. Has the patient been a risk to others within the distant past?  No.  Allergies:   Allergies  Allergen Reactions  . Ciprofloxacin     REACTION: sun induced rash   Current Medications: Current Outpatient Prescriptions  Medication Sig Dispense Refill  . ALPRAZolam (XANAX) 0.25 MG tablet Take 1 tablet (0.25 mg total) by mouth at bedtime as needed for anxiety. 90 tablet 1  . ARIPiprazole (ABILIFY) 5 MG tablet Take 1 tablet (5 mg total) by mouth daily. 90 tablet 1  . Armodafinil 150 MG tablet Take 1 tablet (150 mg total) by mouth daily. 90 tablet 1  . aspirin 81 MG EC tablet Take 81  mg by mouth. Reported on 07/04/2015    . Cholecalciferol (VITAMIN D) 1000 UNITS capsule Take 1 capsule (1,000 Units total) by mouth daily.    . diclofenac (VOLTAREN) 75 MG EC tablet Take 75 mg by mouth 2 (two) times daily.    Marland Kitchen levothyroxine (SYNTHROID, LEVOTHROID) 50 MCG tablet TAKE 1 TABLET (50 MCG TOTAL) BY MOUTH DAILY. 90 tablet 1  . Multiple Vitamin (MULTIVITAMIN) tablet Take 1 tablet by mouth daily.    Marland Kitchen venlafaxine XR (EFFEXOR XR) 75 MG 24 hr capsule Take 1 capsule (75 mg total) by mouth daily with breakfast. 90 capsule 1  . vitamin B-12 (CYANOCOBALAMIN) 1000 MCG tablet Take 1,000 mcg by mouth daily.     No current facility-administered medications for this visit.     Previous Psychotropic Medications:  Prozac- worked well.  Wellbutrin Alprazolam- many years Celexa - no change   Substance Abuse History in the last 12 months:  No.  Consequences of Substance Abuse: Negative NA  Medical Decision Making:  Review of Psycho-Social Stressors (1)  Treatment Plan Summary: Medication management   Discussed with patient or the medications treatment risks benefits and alternatives Continue  Abilify 5 mg to help as an add-on to her antidepressant medications Continue  Effexor XR 75 mg and patient will take 2 pills in the morning. I also advised her  to decrease the dose of Armodafinil  to 150  mg and take 1/2 pill daily. she demonstrated understanding Advised her to start taking alprazolam on a when necessary basis She  has supply of the medications She agreed with the plan Patient will follow up in 6  weeks or earlier depending on her symptoms    More than 50% of the time spent in psychoeducation, counseling and coordination of care.    This note was generated in part or whole with voice recognition software. Voice regonition is usually quite accurate but there are transcription errors that can and very often do occur. I apologize for any typographical errors that were not detected and corrected.     Rainey Pines, MD  11/28/201711:38 AM

## 2016-03-21 HISTORY — PX: RETINAL DETACHMENT REPAIR W/ SCLERAL BUCKLE LE: SHX2338

## 2016-04-02 ENCOUNTER — Ambulatory Visit (INDEPENDENT_AMBULATORY_CARE_PROVIDER_SITE_OTHER): Payer: Medicare Other

## 2016-04-02 VITALS — BP 102/70 | HR 85 | Temp 98.1°F | Ht 64.0 in | Wt 191.5 lb

## 2016-04-02 DIAGNOSIS — Z0000 Encounter for general adult medical examination without abnormal findings: Secondary | ICD-10-CM | POA: Diagnosis not present

## 2016-04-02 NOTE — Patient Instructions (Signed)
Faith Santiago , Thank you for taking time to come for your Medicare Wellness Visit. I appreciate your ongoing commitment to your health goals. Please review the following plan we discussed and let me know if I can assist you in the future.   These are the goals we discussed: Goals    . Increase physical activity          Starting 05/17/2015, I will begin at least 10 min daily when vision has improved.        This is a list of the screening recommended for you and due dates:  Health Maintenance  Topic Date Due  . Mammogram  11/27/2016*  . Shingles Vaccine  04/02/2017*  . DTaP/Tdap/Td vaccine (1 - Tdap) 09/10/2017*  . Tetanus Vaccine  09/10/2017  . Colon Cancer Screening  09/29/2017  . Flu Shot  Completed  . DEXA scan (bone density measurement)  Completed  . Pneumonia vaccines  Completed  *Topic was postponed. The date shown is not the original due date.   Preventive Care for Adults  A healthy lifestyle and preventive care can promote health and wellness. Preventive health guidelines for adults include the following key practices.  . A routine yearly physical is a good way to check with your health care provider about your health and preventive screening. It is a chance to share any concerns and updates on your health and to receive a thorough exam.  . Visit your dentist for a routine exam and preventive care every 6 months. Brush your teeth twice a day and floss once a day. Good oral hygiene prevents tooth decay and gum disease.  . The frequency of eye exams is based on your age, health, family medical history, use  of contact lenses, and other factors. Follow your health care provider's ecommendations for frequency of eye exams.  . Eat a healthy diet. Foods like vegetables, fruits, whole grains, low-fat dairy products, and lean protein foods contain the nutrients you need without too many calories. Decrease your intake of foods high in solid fats, added sugars, and salt. Eat the right  amount of calories for you. Get information about a proper diet from your health care provider, if necessary.  . Regular physical exercise is one of the most important things you can do for your health. Most adults should get at least 150 minutes of moderate-intensity exercise (any activity that increases your heart rate and causes you to sweat) each week. In addition, most adults need muscle-strengthening exercises on 2 or more days a week.  Silver Sneakers may be a benefit available to you. To determine eligibility, you may visit the website: www.silversneakers.com or contact program at 863-682-7776 Mon-Fri between 8AM-8PM.   . Maintain a healthy weight. The body mass index (BMI) is a screening tool to identify possible weight problems. It provides an estimate of body fat based on height and weight. Your health care provider can find your BMI and can help you achieve or maintain a healthy weight.   For adults 20 years and older: ? A BMI below 18.5 is considered underweight. ? A BMI of 18.5 to 24.9 is normal. ? A BMI of 25 to 29.9 is considered overweight. ? A BMI of 30 and above is considered obese.   . Maintain normal blood lipids and cholesterol levels by exercising and minimizing your intake of saturated fat. Eat a balanced diet with plenty of fruit and vegetables. Blood tests for lipids and cholesterol should begin at age 46 and be  repeated every 5 years. If your lipid or cholesterol levels are high, you are over 50, or you are at high risk for heart disease, you may need your cholesterol levels checked more frequently. Ongoing high lipid and cholesterol levels should be treated with medicines if diet and exercise are not working.  . If you smoke, find out from your health care provider how to quit. If you do not use tobacco, please do not start.  . If you choose to drink alcohol, please do not consume more than 2 drinks per day. One drink is considered to be 12 ounces (355 mL) of beer, 5  ounces (148 mL) of wine, or 1.5 ounces (44 mL) of liquor.  . If you are 41-61 years old, ask your health care provider if you should take aspirin to prevent strokes.  . Use sunscreen. Apply sunscreen liberally and repeatedly throughout the day. You should seek shade when your shadow is shorter than you. Protect yourself by wearing long sleeves, pants, a wide-brimmed hat, and sunglasses year round, whenever you are outdoors.  . Once a month, do a whole body skin exam, using a mirror to look at the skin on your back. Tell your health care provider of new moles, moles that have irregular borders, moles that are larger than a pencil eraser, or moles that have changed in shape or color.

## 2016-04-02 NOTE — Progress Notes (Signed)
PCP notes:   Health maintenance:  Mammogram - discussed; pt will plans to make future appt Shingles - postponed/insurance  Abnormal screenings:   None  Patient concerns:   Pt recently had surgery to repair retina detachment in right eye. Pt is still on driving and other medical restrictions   Nurse concerns:  None  Next PCP appt:   05/14/2016 @ 1530

## 2016-04-02 NOTE — Progress Notes (Signed)
Pre visit review using our clinic review tool, if applicable. No additional management support is needed unless otherwise documented below in the visit note. 

## 2016-04-02 NOTE — Progress Notes (Signed)
I reviewed health advisor's note, was available for consultation, and agree with documentation and plan.  

## 2016-04-02 NOTE — Progress Notes (Signed)
Subjective:   Faith Santiago is a 75 y.o. female who presents for Medicare Annual (Subsequent) preventive examination.  Review of Systems:  N/A Cardiac Risk Factors include: advanced age (>1men, >89 women);obesity (BMI >30kg/m2);dyslipidemia     Objective:     Vitals: BP 102/70 (BP Location: Left Arm, Patient Position: Sitting, Cuff Size: Normal)   Pulse 85   Temp 98.1 F (36.7 C) (Oral)   Ht 5\' 4"  (1.626 m) Comment: no shoes  Wt 191 lb 8 oz (86.9 kg)   SpO2 94%   BMI 32.87 kg/m   Body mass index is 32.87 kg/m.   Tobacco History  Smoking Status  . Never Smoker  Smokeless Tobacco  . Never Used     Counseling given: Not Answered   Past Medical History:  Diagnosis Date  . Anemia, unspecified   . Anxiety   . Arthritis    hands R>L, neck and lower back  . Bimalleolar fracture of left ankle 11/19/2013   [place]  . Depression   . Esophageal reflux   . History of osteopenia    DEXA WNL 2008, 2015  . History of uterine cancer 1999   s/p hysterectomy  . HLD (hyperlipidemia)   . Narcolepsy   . Thyroid disease    Past Surgical History:  Procedure Laterality Date  . bilateral catarct removal  2010  . COLONOSCOPY  05/2003   WNL (Dr Velora Heckler)  . DEXA  03/2014   WNL T score +3.9  . ORIF ANKLE FRACTURE Left 11/19/2013   Procedure: LEFT ANKLE FRACTURE OPEN TREATMENT BIMALLEOLAR ANKLE INCLUDES INTERNAL FIXATION ;  Surgeon: Johnny Bridge, MD  . RETINAL DETACHMENT REPAIR W/ SCLERAL BUCKLE LE Right 03/21/2016  . TOTAL ABDOMINAL HYSTERECTOMY  1999   uterine cancer   Family History  Problem Relation Age of Onset  . Hypothyroidism Mother   . Stroke Mother     ministroke  . Coronary artery disease Mother 63    s/p some heart surgery  . Cancer Father     prostate  . Diabetes Neg Hx    History  Sexual Activity  . Sexual activity: No    Outpatient Encounter Prescriptions as of 04/02/2016  Medication Sig  . ALPRAZolam (XANAX) 0.25 MG tablet Take 1 tablet (0.25 mg  total) by mouth at bedtime as needed for anxiety.  . ARIPiprazole (ABILIFY) 5 MG tablet Take 1 tablet (5 mg total) by mouth daily.  . Armodafinil 150 MG tablet Take 1 tablet (150 mg total) by mouth daily.  Marland Kitchen aspirin 81 MG EC tablet Take 81 mg by mouth. Reported on 07/04/2015  . Cholecalciferol (VITAMIN D) 1000 UNITS capsule Take 1 capsule (1,000 Units total) by mouth daily.  . diclofenac (VOLTAREN) 75 MG EC tablet Take 75 mg by mouth 2 (two) times daily.  Marland Kitchen levothyroxine (SYNTHROID, LEVOTHROID) 50 MCG tablet TAKE 1 TABLET (50 MCG TOTAL) BY MOUTH DAILY.  . Multiple Vitamin (MULTIVITAMIN) tablet Take 1 tablet by mouth daily.  Marland Kitchen venlafaxine XR (EFFEXOR XR) 75 MG 24 hr capsule Take 1 capsule (75 mg total) by mouth daily with breakfast.  . vitamin B-12 (CYANOCOBALAMIN) 1000 MCG tablet Take 1,000 mcg by mouth daily.   No facility-administered encounter medications on file as of 04/02/2016.     Activities of Daily Living In your present state of health, do you have any difficulty performing the following activities: 04/02/2016  Hearing? N  Vision? Y  Difficulty concentrating or making decisions? N  Walking or climbing stairs?  N  Dressing or bathing? N  Doing errands, shopping? Y  Preparing Food and eating ? N  Using the Toilet? N  In the past six months, have you accidently leaked urine? N  Do you have problems with loss of bowel control? N  Managing your Medications? N  Managing your Finances? N  Housekeeping or managing your Housekeeping? N  Some recent data might be hidden    Patient Care Team: [name], MD as PCP - General (Family Medicine) Thelma Comp, OD as Consulting Physician (Optometry)    Assessment:     Hearing Screening   125Hz  250Hz  500Hz  1000Hz  2000Hz  3000Hz  4000Hz  6000Hz  8000Hz   Right ear:   40 40 40  40    Left ear:   40 40 40  40    Vision Screening Comments: Last vision in Dec 2017 with Dr. Maryruth Hancock B.   Exercise Activities and Dietary  recommendations Current Exercise Habits: The patient does not participate in regular exercise at present, Exercise limited by: None identified  Goals    . Increase physical activity          Starting 05/17/2015, I will begin at least 10 min daily when vision has improved.       Fall Risk Fall Risk  04/02/2016 04/07/2015 03/15/2014 08/28/2012 08/28/2012  Falls in the past year? No Yes Yes No No  Number falls in past yr: - 1 2 or more - -  Injury with Fall? - No - - -  Risk Factor Category  - - (No Data) - -   Depression Screen PHQ 2/9 Scores 04/02/2016 06/01/2015 04/07/2015 11/25/2014  PHQ - 2 Score 0 6 6 6   PHQ- 9 Score - 25 19 25      Cognitive Function MMSE - Mini Mental State Exam 04/02/2016  Orientation to time 5  Orientation to Place 5  Registration 3  Attention/ Calculation 0  Recall 3  Language- name 2 objects 0  Language- repeat 1  Language- follow 3 step command 3  Language- read & follow direction 0  Write a sentence 0  Copy design 0  Total score 20     PLEASE NOTE: A Mini-Cog screen was completed. Maximum score is 20. A value of 0 denotes this part of Folstein MMSE was not completed or the patient failed this part of the Mini-Cog screening.   Mini-Cog Screening Orientation to Time - Max 5 pts Orientation to Place - Max 5 pts Registration - Max 3 pts Recall - Max 3 pts Language Repeat - Max 1 pts Language Follow 3 Step Command - Max 3 pts     Immunization History  Administered Date(s) Administered  . Influenza, High Dose Seasonal PF 01/18/2016  . Influenza,inj,Quad PF,36+ Mos 01/12/2014, 03/28/2015  . Pneumococcal Conjugate-13 03/15/2014  . Pneumococcal Polysaccharide-23 06/07/2009  . Td 09/11/2007   Screening Tests Health Maintenance  Topic Date Due  . MAMMOGRAM  11/27/2016 (Originally 11/29/2015)  . ZOSTAVAX  04/02/2017 (Originally 09/24/2002)  . DTaP/Tdap/Td (1 - Tdap) 09/10/2017 (Originally 09/12/2007)  . TETANUS/TDAP  09/10/2017  . COLONOSCOPY   09/29/2017  . INFLUENZA VACCINE  Completed  . DEXA SCAN  Completed  . PNA vac Low Risk Adult  Completed      Plan:     I have personally reviewed and addressed the Medicare Annual Wellness questionnaire and have noted the following in the patient's chart:  A. Medical and social history B. Use of alcohol, tobacco or illicit drugs  C. Current medications and  supplements D. Functional ability and status E.  Nutritional status F.  Physical activity G. Advance directives H. List of other physicians I.  Hospitalizations, surgeries, and ER visits in previous 12 months J.  [place] to include hearing, vision, cognitive, depression L. Referrals and appointments - none  In addition, I have reviewed and discussed with patient certain preventive protocols, quality metrics, and best practice recommendations. A written personalized care plan for preventive services as well as general preventive health recommendations were provided to patient.  See attached scanned questionnaire for additional information.   Signed,   Lindell Noe, MHA, BS, LPN Health Coach

## 2016-04-05 ENCOUNTER — Telehealth: Payer: Self-pay

## 2016-04-05 ENCOUNTER — Other Ambulatory Visit: Payer: Self-pay | Admitting: Psychiatry

## 2016-04-05 MED ORDER — VENLAFAXINE HCL ER 150 MG PO CP24: 150.0000 mg | ORAL_CAPSULE | Freq: Every day | ORAL | 1 refills | Status: DC

## 2016-04-05 NOTE — Telephone Encounter (Signed)
pt called states that she needs on effexor refilled for double the amount. pt states that at her last visit you told her that she could take double the amount if need too. pt states it works well

## 2016-04-05 NOTE — Telephone Encounter (Signed)
effexor- 150mg  qam refilled

## 2016-04-24 ENCOUNTER — Ambulatory Visit (INDEPENDENT_AMBULATORY_CARE_PROVIDER_SITE_OTHER): Payer: 59 | Admitting: Psychiatry

## 2016-04-24 ENCOUNTER — Encounter: Payer: Self-pay | Admitting: Psychiatry

## 2016-04-24 VITALS — BP 101/65 | HR 103 | Temp 98.0°F | Wt 196.4 lb

## 2016-04-24 DIAGNOSIS — F331 Major depressive disorder, recurrent, moderate: Secondary | ICD-10-CM

## 2016-04-24 MED ORDER — ARIPIPRAZOLE 5 MG PO TABS: 5.0000 mg | ORAL_TABLET | Freq: Every day | ORAL | 1 refills | Status: DC

## 2016-04-24 MED ORDER — VENLAFAXINE HCL ER 150 MG PO CP24: 150.0000 mg | ORAL_CAPSULE | Freq: Every day | ORAL | 1 refills | Status: DC

## 2016-04-24 MED ORDER — ALPRAZOLAM 0.25 MG PO TABS: 0.2500 mg | ORAL_TABLET | Freq: Every evening | ORAL | 1 refills | Status: DC | PRN

## 2016-04-24 MED ORDER — ARMODAFINIL 150 MG PO TABS: 150.0000 mg | ORAL_TABLET | Freq: Every day | ORAL | 1 refills | Status: DC

## 2016-04-24 NOTE — Progress Notes (Signed)
Psychiatric MD Progress Note   Patient Identification: Faith Santiago MRN:  [id number] Date of Evaluation:  04/24/2016 Referral Source: PCP Chief Complaint:   Chief Complaint    Follow-up; Medication Refill     Visit Diagnosis:    ICD-9-CM ICD-10-CM   1. MDD (major depressive disorder), recurrent episode, moderate (HCC) 296.32 F33.1    Diagnosis:   Patient Active Problem List   Diagnosis Date Noted  . Hypothyroidism [E03.9] 04/07/2015  . Vitamin D deficiency [E55.9] 04/02/2015  . Macrocytosis [D75.89] 03/28/2015  . Other fatigue [R53.83] 11/25/2014  . Advanced care planning/counseling discussion [Z71.89] 03/15/2014  . Health maintenance examination [Z00.00] 03/15/2014  . Postsurgical menopause [E89.40] 08/07/2011  . Medicare annual wellness visit, subsequent [Z00.00] 08/06/2011  . Anemia [D64.9] 06/07/2009  . Osteoarthritis [M19.90] 06/07/2009  . Narcolepsy without cataplexy(347.00) [G47.419] 05/31/2008  . HLD (hyperlipidemia) [E78.5] 06/03/2007  . MDD (major depressive disorder), recurrent episode, severe ([place]) [F33.2] 06/03/2007  . GERD [K21.9] 06/03/2007   History of Present Illness:   Patient is a 76 year old married female who presented for follow up.  She reported that she had the retina detachment on December 7. Since then she has been going to the ophthalmologist and has been getting the treatment. She already had one surgery done. She continues to have the bubble in her eye and is going to have the follow-up appointment in family. Patient reported that she has difficulty with the vision in her eye.patient reported that she is not driving and her husband is helping with the driving and going to her doctor's appointments. She stated that she is compliant with her medications and they're helping her. She does not have any side effects. She appeared calm and alert during the interview and was discussing in detail about her medications. She reported that she is improving on her  medications and is not having any adverse reactions at this time.     Patient currently denied having any suicidal ideations or plans. She appeared calm and collected during the interview.   Discussed about her medications in detail. Patient is sleeping well at this time.   She appeared calm and collective during the interview.  Elements:  Severity:  moderate. Associated Signs/Symptoms: Depression Symptoms:  depressed mood, fatigue, anxiety, loss of energy/fatigue, (Hypo) Manic Symptoms:  none Anxiety Symptoms:  Excessive Worry, Psychotic Symptoms:  none PTSD Symptoms: Negative NA  Past Medical History:  Past Medical History:  Diagnosis Date  . Anemia, unspecified   . Anxiety   . Arthritis    hands R>L, neck and lower back  . Bimalleolar fracture of left ankle 11/19/2013   [place]  . Depression   . Esophageal reflux   . History of osteopenia    DEXA WNL 2008, 2015  . History of uterine cancer 1999   s/p hysterectomy  . HLD (hyperlipidemia)   . Narcolepsy   . Thyroid disease     Past Surgical History:  Procedure Laterality Date  . bilateral catarct removal  2010  . COLONOSCOPY  05/2003   WNL (Dr Velora Heckler)  . DEXA  03/2014   WNL T score +3.9  . ORIF ANKLE FRACTURE Left 11/19/2013   Procedure: LEFT ANKLE FRACTURE OPEN TREATMENT BIMALLEOLAR ANKLE INCLUDES INTERNAL FIXATION ;  Surgeon: Johnny Bridge, MD  . RETINAL DETACHMENT REPAIR W/ SCLERAL BUCKLE LE Right 03/21/2016  . TOTAL ABDOMINAL HYSTERECTOMY  1999   uterine cancer   Family History:  Family History  Problem Relation Age of Onset  . Hypothyroidism Mother   .  Stroke Mother     ministroke  . Coronary artery disease Mother 44    s/p some heart surgery  . Cancer Father     prostate  . Diabetes Neg Hx    Social History:   Social History   Social History  . Marital status: Married    Spouse name: N/A  . Number of children: N/A  . Years of education: N/A   Social History Main Topics  . Smoking status:  Never Smoker  . Smokeless tobacco: Never Used  . Alcohol use 4.2 - 8.4 oz/week    7 - 10 Glasses of wine per week     Comment: 1 drink /day  . Drug use: No  . Sexual activity: No   Other Topics Concern  . 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 Has a son and daughter.  She enjoys quilting and belong to a [employer] group. 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 She moved to [facility] and worked at [place] [employer] in [place].  Moved to [place] in 1976. Worked in [employer] and [employer] x 26 years and retired from there.   Musculoskeletal: Strength & Muscle Tone: within normal limits Gait & Station: normal Patient leans: N/A  Psychiatric Specialty Exam: HPI  ROS  Blood pressure 101/65, pulse (!) 103, temperature 98 F (36.7 C), temperature source Oral, weight 196 lb 6.4 oz (89.1 kg).Body mass index is 33.71 kg/m.  General Appearance: Casual and Fairly Groomed  Eye Contact:  Fair  Speech:  Clear and Coherent and Slow  Volume:  Normal  Mood:  Depressed  Affect:  Congruent and Depressed  Thought Process:  Goal Directed  Orientation:  Full (Time, Place, and Person)  Thought Content:  WDL  Suicidal Thoughts:  No  Homicidal Thoughts:  No  Memory:  Immediate;   Fair  Judgement:  Fair  Insight:  Fair  Psychomotor Activity:  Normal  Concentration:  Fair  Recall:  [employer] of Knowledge:Fair  Language: Fair  Akathisia:  No  Handed:  Right  AIMS (if indicated):    Assets:  Communication Skills Desire for Improvement Physical Health Social Support  ADL's:  Intact  Cognition: WNL  Sleep:     Is the patient at risk to self?  No. Has the patient been a risk to self in the past 6 months?   No. Has the patient been a risk to self within the distant past?  No. Is the patient a risk to others?  No. Has the patient been a risk to others in the past 6 months?  No. Has the patient been a risk to others within the distant past?  No.  Allergies:   Allergies  Allergen Reactions  . Ciprofloxacin     REACTION: sun induced rash   Current Medications: Current Outpatient Prescriptions  Medication Sig Dispense Refill  . ALPRAZolam (XANAX) 0.25 MG tablet Take 1 tablet (0.25 mg total) by mouth at bedtime as needed for anxiety. 90 tablet 1  . ARIPiprazole (ABILIFY) 5 MG tablet Take 1 tablet (5 mg total) by mouth daily. 90 tablet 1  . Armodafinil 150 MG tablet Take 1 tablet (150 mg total)  by mouth daily. 90 tablet 1  . aspirin 81 MG EC tablet Take 81 mg by mouth. Reported on 07/04/2015    . Cholecalciferol (VITAMIN D) 1000 UNITS capsule Take 1 capsule (1,000 Units total) by mouth daily.    . diclofenac (VOLTAREN) 75 MG EC tablet Take 75 mg by mouth 2 (two) times daily.    Marland Kitchen levothyroxine (SYNTHROID, LEVOTHROID) 50 MCG tablet TAKE 1 TABLET (50 MCG TOTAL) BY MOUTH DAILY. 90 tablet 1  . Multiple Vitamin (MULTIVITAMIN) tablet Take 1 tablet by mouth daily.    Marland Kitchen venlafaxine XR (EFFEXOR-XR) 150 MG 24 hr capsule Take 1 capsule (150 mg total) by mouth daily with breakfast. 30 capsule 1  . vitamin B-12 (CYANOCOBALAMIN) 1000 MCG tablet Take 1,000 mcg by mouth daily.     No current facility-administered medications for this visit.     Previous Psychotropic Medications:  Prozac- worked well.  Wellbutrin Alprazolam- many years Celexa - no change   Substance Abuse History in the last 12 months:  No.  Consequences of Substance Abuse: Negative NA  Medical Decision Making:  Review of Psycho-Social Stressors (1)  Treatment Plan Summary: Medication management   Discussed with patient or the medications treatment risks benefits and alternatives Continue  Abilify 5 mg to help as an add-on to her  antidepressant medications Continue  Effexor XR  150 mg and patient will take 2 pills in the morning. I also advised her to decrease the dose of Armodafinil  to 150  mg and take 1/2 pill daily. she demonstrated understanding Advised her to start taking alprazolam on a when necessary basis Medications refilled Patient will follow up in 2 months or earlier depending on her symptoms    More than 50% of the time spent in psychoeducation, counseling and coordination of care.    This note was generated in part or whole with voice recognition software. Voice regonition is usually quite accurate but there are transcription errors that can and very often do occur. I apologize for any typographical errors that were not detected and corrected.     Rainey Pines, MD  1/10/201812:28 PM

## 2016-05-05 ENCOUNTER — Other Ambulatory Visit: Payer: Self-pay | Admitting: Family Medicine

## 2016-05-05 DIAGNOSIS — D7589 Other specified diseases of blood and blood-forming organs: Secondary | ICD-10-CM

## 2016-05-05 DIAGNOSIS — R7303 Prediabetes: Secondary | ICD-10-CM

## 2016-05-05 DIAGNOSIS — E039 Hypothyroidism, unspecified: Secondary | ICD-10-CM

## 2016-05-05 DIAGNOSIS — D649 Anemia, unspecified: Secondary | ICD-10-CM

## 2016-05-05 DIAGNOSIS — E559 Vitamin D deficiency, unspecified: Secondary | ICD-10-CM

## 2016-05-05 DIAGNOSIS — E785 Hyperlipidemia, unspecified: Secondary | ICD-10-CM

## 2016-05-07 ENCOUNTER — Other Ambulatory Visit: Payer: Self-pay

## 2016-05-14 ENCOUNTER — Encounter: Payer: Self-pay | Admitting: Family Medicine

## 2016-07-02 ENCOUNTER — Other Ambulatory Visit: Payer: Self-pay | Admitting: Psychiatry

## 2016-07-24 ENCOUNTER — Other Ambulatory Visit: Payer: Self-pay | Admitting: Psychiatry

## 2016-08-06 ENCOUNTER — Other Ambulatory Visit (INDEPENDENT_AMBULATORY_CARE_PROVIDER_SITE_OTHER): Payer: Medicare Other

## 2016-08-06 DIAGNOSIS — D649 Anemia, unspecified: Secondary | ICD-10-CM | POA: Diagnosis not present

## 2016-08-06 DIAGNOSIS — D7589 Other specified diseases of blood and blood-forming organs: Secondary | ICD-10-CM | POA: Diagnosis not present

## 2016-08-06 DIAGNOSIS — E559 Vitamin D deficiency, unspecified: Secondary | ICD-10-CM | POA: Diagnosis not present

## 2016-08-06 DIAGNOSIS — R7303 Prediabetes: Secondary | ICD-10-CM | POA: Diagnosis not present

## 2016-08-06 DIAGNOSIS — E039 Hypothyroidism, unspecified: Secondary | ICD-10-CM

## 2016-08-06 DIAGNOSIS — E785 Hyperlipidemia, unspecified: Secondary | ICD-10-CM

## 2016-08-06 LAB — BASIC METABOLIC PANEL
BUN: 25 mg/dL — ABNORMAL HIGH (ref 6–23)
CALCIUM: 9.3 mg/dL (ref 8.4–10.5)
CHLORIDE: 103 meq/L (ref 96–112)
CO2: 27 meq/L (ref 19–32)
Creatinine, Ser: 0.97 mg/dL (ref 0.40–1.20)
GFR: 59.69 mL/min — ABNORMAL LOW (ref >60.00)
GLUCOSE: 101 mg/dL — AB (ref 70–99)
Potassium: 4.8 mEq/L (ref 3.5–5.1)
SODIUM: 137 meq/L (ref 135–145)

## 2016-08-06 LAB — CBC WITH DIFFERENTIAL/PLATELET
Basophils Absolute: 0 K/uL (ref 0.0–0.1)
Basophils Relative: 0.8 % (ref 0.0–3.0)
Eosinophils Absolute: 0.2 K/uL (ref 0.0–0.7)
Eosinophils Relative: 4.6 % (ref 0.0–5.0)
HCT: 39 % (ref 36.0–46.0)
Hemoglobin: 13.4 g/dL (ref 12.0–15.0)
Lymphocytes Relative: 31 % (ref 12.0–46.0)
Lymphs Abs: 1.6 K/uL (ref 0.7–4.0)
MCHC: 34.4 g/dL (ref 30.0–36.0)
MCV: 101.4 fl — ABNORMAL HIGH (ref 78.0–100.0)
Monocytes Absolute: 0.6 K/uL (ref 0.1–1.0)
Monocytes Relative: 12.2 % — ABNORMAL HIGH (ref 3.0–12.0)
Neutro Abs: 2.6 K/uL (ref 1.4–7.7)
Neutrophils Relative %: 51.4 % (ref 43.0–77.0)
Platelets: 263 K/uL (ref 150.0–400.0)
RBC: 3.85 Mil/uL — ABNORMAL LOW (ref 3.87–5.11)
RDW: 13.7 % (ref 11.5–15.5)
WBC: 5.1 K/uL (ref 4.0–10.5)

## 2016-08-06 LAB — LIPID PANEL
CHOLESTEROL: 257 mg/dL — AB (ref 0–200)
HDL: 84.1 mg/dL (ref >39.00)
LDL Cholesterol: 146 mg/dL — ABNORMAL HIGH (ref 0–99)
NONHDL: 172.69
Total CHOL/HDL Ratio: 3
Triglycerides: 132 mg/dL (ref 0.0–149.0)
VLDL: 26.4 mg/dL (ref 0.0–40.0)

## 2016-08-06 LAB — VITAMIN D 25 HYDROXY (VIT D DEFICIENCY, FRACTURES): VITD: 38.29 ng/mL (ref 30.00–100.00)

## 2016-08-06 LAB — HEMOGLOBIN A1C: Hgb A1c MFr Bld: 6 % (ref 4.6–6.5)

## 2016-08-06 LAB — VITAMIN B12: Vitamin B-12: >1500 pg/mL — ABNORMAL HIGH (ref 211–911)

## 2016-08-06 LAB — TSH: TSH: 2.04 u[IU]/mL (ref 0.35–4.50)

## 2016-08-07 LAB — PATHOLOGIST SMEAR REVIEW

## 2016-08-13 ENCOUNTER — Encounter: Payer: Self-pay | Admitting: Family Medicine

## 2016-08-13 ENCOUNTER — Ambulatory Visit (INDEPENDENT_AMBULATORY_CARE_PROVIDER_SITE_OTHER): Payer: Medicare Other | Admitting: Family Medicine

## 2016-08-13 VITALS — BP 110/68 | HR 72 | Temp 97.6°F | Wt 198.2 lb

## 2016-08-13 DIAGNOSIS — M19042 Primary osteoarthritis, left hand: Secondary | ICD-10-CM | POA: Diagnosis not present

## 2016-08-13 DIAGNOSIS — E785 Hyperlipidemia, unspecified: Secondary | ICD-10-CM

## 2016-08-13 DIAGNOSIS — D7589 Other specified diseases of blood and blood-forming organs: Secondary | ICD-10-CM

## 2016-08-13 DIAGNOSIS — M19041 Primary osteoarthritis, right hand: Secondary | ICD-10-CM

## 2016-08-13 DIAGNOSIS — Z0000 Encounter for general adult medical examination without abnormal findings: Secondary | ICD-10-CM | POA: Diagnosis not present

## 2016-08-13 DIAGNOSIS — G47419 Narcolepsy without cataplexy: Secondary | ICD-10-CM | POA: Diagnosis not present

## 2016-08-13 DIAGNOSIS — F332 Major depressive disorder, recurrent severe without psychotic features: Secondary | ICD-10-CM

## 2016-08-13 DIAGNOSIS — E039 Hypothyroidism, unspecified: Secondary | ICD-10-CM | POA: Diagnosis not present

## 2016-08-13 DIAGNOSIS — Z1211 Encounter for screening for malignant neoplasm of colon: Secondary | ICD-10-CM

## 2016-08-13 DIAGNOSIS — E559 Vitamin D deficiency, unspecified: Secondary | ICD-10-CM | POA: Diagnosis not present

## 2016-08-13 MED ORDER — DICLOFENAC SODIUM 75 MG PO TBEC: 75.0000 mg | DELAYED_RELEASE_TABLET | Freq: Two times a day (BID) | ORAL | 1 refills | Status: DC

## 2016-08-13 NOTE — Assessment & Plan Note (Signed)
Chronic, stable. periph smear done 07/2016

## 2016-08-13 NOTE — Assessment & Plan Note (Signed)
Now good range - continue current regimen.

## 2016-08-13 NOTE — Assessment & Plan Note (Signed)
Chronic, discussed high LDL. Reviewed improvement of trig. Discussed healthy diet changes to improve trig levels

## 2016-08-13 NOTE — Assessment & Plan Note (Signed)
Continue diclofenac. Discussed NSAID precautions.

## 2016-08-13 NOTE — Progress Notes (Signed)
BP 110/68   Pulse 72   Temp 97.6 F (36.4 C) (Oral)   Wt 198 lb 4 oz (89.9 kg)   BMI 34.03 kg/m    CC: CPE Subjective:    Patient ID: Faith Santiago, female    DOB: 1941/01/25, 76 y.o.   MRN: [id number]  HPI: Faith Santiago is a 76 y.o. female presenting on 08/13/2016 for Annual Exam   Saw Katha Cabal 03/2016 for medicare wellness visit, note reviewed.  MDD followed by Dr Gretel Acre, on abilify and effexor XR and PRN alprazolam. armodofanil was discontinued. Has f/u scheduled tomorrow with psych.   Discussed recent eye surgery.   Hand arthritis treated with diclofenac 66m bid. Off this med for last few months but requests refill.   Preventative: COLONOSCOPY Date: 05/2003 WNL (Dr LVelora Heckler - discussed, would like stool kit Mammogram 11/2014 WNL. Declines breast exam. Will call to schedule mammogram later this year.  DEXA Date: 03/2014 WNL T score +3.9  Pelvic - s/p total hysterectomy (ovaries removed) for uterine cancer.Pap smear WNL 08/2011. Tapered off estrogen therapy over last few years.  Flu yearly.  Pneumovax 2011, prevnar 2015.  Td 2009.  Shingrix - interested in this. Discussed to get at pharmacy  Advanced directives - living will at home. Would want husband to be HCPOA. Will bring me copy. Seat belt use discussed Sunscreen use discussed, no changing moles on skin. Non smoker Alcohol - 1 drink/day  caffeine: couple cups/day  Lives with husband, 2 grown children, 1 cat  Occupation: retired, was OT for [employer] Activity: no regular exercise, wants to start walking  Diet: overeating, good amt water, vegetables daily, occasional fruits  Relevant past medical, surgical, family and social history reviewed and updated as indicated. Interim medical history since our last visit reviewed. Allergies and medications reviewed and updated. Outpatient Medications Prior to Visit  Medication Sig Dispense Refill  . ALPRAZolam (XANAX) 0.25 MG tablet Take 1 tablet (0.25 mg total) by mouth at  bedtime as needed for anxiety. 90 tablet 1  . ARIPiprazole (ABILIFY) 5 MG tablet Take 1 tablet (5 mg total) by mouth daily. 90 tablet 1  . aspirin 81 MG EC tablet Take 81 mg by mouth. Reported on 07/04/2015    . Cholecalciferol (VITAMIN D) 1000 UNITS capsule Take 1 capsule (1,000 Units total) by mouth daily.    .Marland Kitchenlevothyroxine (SYNTHROID, LEVOTHROID) 50 MCG tablet TAKE 1 TABLET (50 MCG TOTAL) BY MOUTH DAILY. 90 tablet 1  . Multiple Vitamin (MULTIVITAMIN) tablet Take 1 tablet by mouth daily.    .Marland Kitchenvenlafaxine XR (EFFEXOR-XR) 150 MG 24 hr capsule Take 1 capsule (150 mg total) by mouth daily with breakfast. 90 capsule 1  . vitamin B-12 (CYANOCOBALAMIN) 1000 MCG tablet Take 1,000 mcg by mouth daily.    . Armodafinil 150 MG tablet Take 1 tablet (150 mg total) by mouth daily. (Patient not taking: Reported on 08/13/2016) 90 tablet 1  . diclofenac (VOLTAREN) 75 MG EC tablet Take 75 mg by mouth 2 (two) times daily.     No facility-administered medications prior to visit.      Per HPI unless specifically indicated in ROS section below Review of Systems  Constitutional: Negative for activity change, appetite change, chills, fatigue, fever and unexpected weight change.  HENT: Negative for hearing loss.   Eyes: Negative for visual disturbance.  Respiratory: Negative for cough, chest tightness, shortness of breath and wheezing.   Cardiovascular: Negative for chest pain, palpitations and leg swelling.  Gastrointestinal: Negative  for abdominal distention, abdominal pain, blood in stool, constipation, diarrhea, nausea and vomiting.  Genitourinary: Negative for difficulty urinating and hematuria.  Musculoskeletal: Negative for arthralgias, myalgias and neck pain.  Skin: Negative for rash.  Neurological: Negative for dizziness, seizures, syncope and headaches.  Hematological: Negative for adenopathy. Does not bruise/bleed easily.  Psychiatric/Behavioral: Positive for dysphoric mood. The patient is not  nervous/anxious.        Objective:    BP 110/68   Pulse 72   Temp 97.6 F (36.4 C) (Oral)   Wt 198 lb 4 oz (89.9 kg)   BMI 34.03 kg/m   Wt Readings from Last 3 Encounters:  08/13/16 198 lb 4 oz (89.9 kg)  04/24/16 196 lb 6.4 oz (89.1 kg)  04/02/16 191 lb 8 oz (86.9 kg)    Physical Exam  Constitutional: She is oriented to person, place, and time. She appears well-developed and well-nourished. No distress.  HENT:  Head: Normocephalic and atraumatic.  Right Ear: Hearing, tympanic membrane, external ear and ear canal normal.  Left Ear: Hearing, tympanic membrane, external ear and ear canal normal.  Nose: Nose normal.  Mouth/Throat: Uvula is midline, oropharynx is clear and moist and mucous membranes are normal. No oropharyngeal exudate, posterior oropharyngeal edema or posterior oropharyngeal erythema.  Eyes: Conjunctivae and EOM are normal. Pupils are equal, round, and reactive to light. No scleral icterus.  Neck: Normal range of motion. Neck supple. Carotid bruit is not present. No thyromegaly present.  Cardiovascular: Normal rate, regular rhythm, normal heart sounds and intact distal pulses.   No murmur heard. Pulses:      Radial pulses are 2+ on the right side, and 2+ on the left side.  Pulmonary/Chest: Effort normal and breath sounds normal. No respiratory distress. She has no wheezes. She has no rales.  Abdominal: Soft. Bowel sounds are normal. She exhibits no distension and no mass. There is no tenderness. There is no rebound and no guarding.  Musculoskeletal: Normal range of motion. She exhibits no edema.  Lymphadenopathy:    She has no cervical adenopathy.  Neurological: She is alert and oriented to person, place, and time.  CN grossly intact, station and gait intact  Skin: Skin is warm and dry. No rash noted.  Psychiatric: She has a normal mood and affect. Her behavior is normal. Judgment and thought content normal.  Nursing note and vitals reviewed.  Results for  orders placed or performed in visit on 08/06/16  Lipid panel  Result Value Ref Range   Cholesterol 257 (H) 0 - 200 mg/dL   Triglycerides 132.0 0.0 - 149.0 mg/dL   HDL 84.10 >39.00 mg/dL   VLDL 26.4 0.0 - 40.0 mg/dL   LDL Cholesterol 146 (H) 0 - 99 mg/dL   Total CHOL/HDL Ratio 3    NonHDL 825.05   Basic metabolic panel  Result Value Ref Range   Sodium 137 135 - 145 mEq/L   Potassium 4.8 3.5 - 5.1 mEq/L   Chloride 103 96 - 112 mEq/L   CO2 27 19 - 32 mEq/L   Glucose, Bld 101 (H) 70 - 99 mg/dL   BUN 25 (H) 6 - 23 mg/dL   Creatinine, Ser 0.97 0.40 - 1.20 mg/dL   Calcium 9.3 8.4 - 10.5 mg/dL   GFR 59.69 (L) >60.00 mL/min  Hemoglobin A1c  Result Value Ref Range   Hgb A1c MFr Bld 6.0 4.6 - 6.5 %  TSH  Result Value Ref Range   TSH 2.04 0.35 - 4.50 uIU/mL  VITAMIN D 25 Hydroxy (Vit-D Deficiency, Fractures)  Result Value Ref Range   VITD 38.29 30.00 - 100.00 ng/mL  Vitamin B12  Result Value Ref Range   Vitamin B-12 >1500 (H) 211 - 911 pg/mL  CBC with Differential/Platelet  Result Value Ref Range   WBC 5.1 4.0 - 10.5 K/uL   RBC 3.85 (L) 3.87 - 5.11 Mil/uL   Hemoglobin 13.4 12.0 - 15.0 g/dL   HCT 39.0 36.0 - 46.0 %   MCV 101.4 (H) 78.0 - 100.0 fl   MCHC 34.4 30.0 - 36.0 g/dL   RDW 13.7 11.5 - 15.5 %   Platelets 263.0 150.0 - 400.0 K/uL   Neutrophils Relative % 51.4 43.0 - 77.0 %   Lymphocytes Relative 31.0 12.0 - 46.0 %   Monocytes Relative 12.2 (H) 3.0 - 12.0 %   Eosinophils Relative 4.6 0.0 - 5.0 %   Basophils Relative 0.8 0.0 - 3.0 %   Neutro Abs 2.6 1.4 - 7.7 K/uL   Lymphs Abs 1.6 0.7 - 4.0 K/uL   Monocytes Absolute 0.6 0.1 - 1.0 K/uL   Eosinophils Absolute 0.2 0.0 - 0.7 K/uL   Basophils Absolute 0.0 0.0 - 0.1 K/uL  Pathologist smear review  Result Value Ref Range   Path Review SEE NOTE       Assessment & Plan:   Problem List Items Addressed This Visit    Health maintenance examination - Primary    Preventative protocols reviewed and updated unless pt  declined. Discussed healthy diet and lifestyle.       HLD (hyperlipidemia)    Chronic, discussed high LDL. Reviewed improvement of trig. Discussed healthy diet changes to improve trig levels       Hypothyroidism    Chronic, stable. Continue levothyroxine 22mg daily.      Macrocytosis    Chronic, stable. periph smear done 07/2016      MDD (major depressive disorder), recurrent episode, severe (HCC)    Stable period. Appreciate psych care. Continue current regimen. Has f/u scheduled for tomorrow.       Narcolepsy without cataplexy    Has weaned off nuvigil per psych.       Osteoarthritis    Continue diclofenac. Discussed NSAID precautions.       Relevant Medications   diclofenac (VOLTAREN) 75 MG EC tablet   Vitamin D deficiency    Now good range - continue current regimen.        Other Visit Diagnoses    Special screening for malignant neoplasms, colon       Relevant Orders   Fecal occult blood, imunochemical       Follow up plan: Return in about 1 year (around 08/13/2017) for annual exam, prior fasting for blood work.  JRia Bush MD

## 2016-08-13 NOTE — Assessment & Plan Note (Signed)
Chronic, stable. Continue levothyroxine 50mcg daily.  ?

## 2016-08-13 NOTE — Assessment & Plan Note (Signed)
Has weaned off nuvigil per psych.

## 2016-08-13 NOTE — Progress Notes (Signed)
Pre visit review using our clinic review tool, if applicable. No additional management support is needed unless otherwise documented below in the visit note. 

## 2016-08-13 NOTE — Assessment & Plan Note (Signed)
Preventative protocols reviewed and updated unless pt declined. Discussed healthy diet and lifestyle.  

## 2016-08-13 NOTE — Assessment & Plan Note (Signed)
Stable period. Appreciate psych care. Continue current regimen. Has f/u scheduled for tomorrow.

## 2016-08-13 NOTE — Patient Instructions (Addendum)
Check with pharmacy about shingrix 2 shot series (new shingles shot). Pass by lab to pick up stool kit. Call to schedule mammogram at your convenience. Good to see you today, call us with any questions or concerns.  Return as needed or in 1 year for next physical.   Health Maintenance, Female Adopting a healthy lifestyle and getting preventive care can go a long way to promote health and wellness. Talk with your health care provider about what schedule of regular examinations is right for you. This is a good chance for you to check in with your provider about disease prevention and staying healthy. In between checkups, there are plenty of things you can do on your own. Experts have done a lot of research about which lifestyle changes and preventive measures are most likely to keep you healthy. Ask your health care provider for more information. Weight and diet Eat a healthy diet  Be sure to include plenty of vegetables, fruits, low-fat dairy products, and lean protein.  Do not eat a lot of foods high in solid fats, added sugars, or salt.  Get regular exercise. This is one of the most important things you can do for your health.  Most adults should exercise for at least 150 minutes each week. The exercise should increase your heart rate and make you sweat (moderate-intensity exercise).  Most adults should also do strengthening exercises at least twice a week. This is in addition to the moderate-intensity exercise. Maintain a healthy weight  Body mass index (BMI) is a measurement that can be used to identify possible weight problems. It estimates body fat based on height and weight. Your health care provider can help determine your BMI and help you achieve or maintain a healthy weight.  For females 77 years of age and older:  A BMI below 18.5 is considered underweight.  A BMI of 18.5 to 24.9 is normal.  A BMI of 25 to 29.9 is considered overweight.  A BMI of 30 and above is considered  obese. Watch levels of cholesterol and blood lipids  You should start having your blood tested for lipids and cholesterol at 74 years of age, then have this test every 5 years.  You may need to have your cholesterol levels checked more often if:  Your lipid or cholesterol levels are high.  You are older than 74 years of age.  You are at high risk for heart disease. Cancer screening Lung Cancer  Lung cancer screening is recommended for adults 27-5 years old who are at high risk for lung cancer because of a history of smoking.  A yearly low-dose CT scan of the lungs is recommended for people who:  Currently smoke.  Have quit within the past 15 years.  Have at least a 30-pack-year history of smoking. A pack year is smoking an average of one pack of cigarettes a day for 1 year.  Yearly screening should continue until it has been 15 years since you quit.  Yearly screening should stop if you develop a health problem that would prevent you from having lung cancer treatment. Breast Cancer  Practice breast self-awareness. This means understanding how your breasts normally appear and feel.  It also means doing regular breast self-exams. Let your health care provider know about any changes, no matter how small.  If you are in your 20s or 30s, you should have a clinical breast exam (CBE) by a health care provider every 1-3 years as part of a regular health exam.  If you are 40 or older, have a CBE every year. Also consider having a breast X-ray (mammogram) every year.  If you have a family history of breast cancer, talk to your health care provider about genetic screening.  If you are at high risk for breast cancer, talk to your health care provider about having an MRI and a mammogram every year.  Breast cancer gene (BRCA) assessment is recommended for women who have family members with BRCA-related cancers. BRCA-related cancers include:  Breast.  Ovarian.  Tubal.  Peritoneal  cancers.  Results of the assessment will determine the need for genetic counseling and BRCA1 and BRCA2 testing. Cervical Cancer  Your health care provider may recommend that you be screened regularly for cancer of the pelvic organs (ovaries, uterus, and vagina). This screening involves a pelvic examination, including checking for microscopic changes to the surface of your cervix (Pap test). You may be encouraged to have this screening done every 3 years, beginning at age 45.  For women ages 18-65, health care providers may recommend pelvic exams and Pap testing every 3 years, or they may recommend the Pap and pelvic exam, combined with testing for human papilloma virus (HPV), every 5 years. Some types of HPV increase your risk of cervical cancer. Testing for HPV may also be done on women of any age with unclear Pap test results.  Other health care providers may not recommend any screening for nonpregnant women who are considered low risk for pelvic cancer and who do not have symptoms. Ask your health care provider if a screening pelvic exam is right for you.  If you have had past treatment for cervical cancer or a condition that could lead to cancer, you need Pap tests and screening for cancer for at least 20 years after your treatment. If Pap tests have been discontinued, your risk factors (such as having a new sexual partner) need to be reassessed to determine if screening should resume. Some women have medical problems that increase the chance of getting cervical cancer. In these cases, your health care provider may recommend more frequent screening and Pap tests. Colorectal Cancer  This type of cancer can be detected and often prevented.  Routine colorectal cancer screening usually begins at 74 years of age and continues through 74 years of age.  Your health care provider may recommend screening at an earlier age if you have risk factors for colon cancer.  Your health care provider may also  recommend using home test kits to check for hidden blood in the stool.  A small camera at the end of a tube can be used to examine your colon directly (sigmoidoscopy or colonoscopy). This is done to check for the earliest forms of colorectal cancer.  Routine screening usually begins at age 34.  Direct examination of the colon should be repeated every 5-10 years through 74 years of age. However, you may need to be screened more often if early forms of precancerous polyps or small growths are found. Skin Cancer  Check your skin from head to toe regularly.  Tell your health care provider about any new moles or changes in moles, especially if there is a change in a mole's shape or color.  Also tell your health care provider if you have a mole that is larger than the size of a pencil eraser.  Always use sunscreen. Apply sunscreen liberally and repeatedly throughout the day.  Protect yourself by wearing long sleeves, pants, a wide-brimmed hat, and sunglasses whenever  you are outside. Heart disease, diabetes, and high blood pressure  High blood pressure causes heart disease and increases the risk of stroke. High blood pressure is more likely to develop in:  People who have blood pressure in the high end of the normal range (130-139/85-89 mm Hg).  People who are overweight or obese.  People who are African American.  If you are 47-59 years of age, have your blood pressure checked every 3-5 years. If you are 84 years of age or older, have your blood pressure checked every year. You should have your blood pressure measured twice-once when you are at a hospital or clinic, and once when you are not at a hospital or clinic. Record the average of the two measurements. To check your blood pressure when you are not at a hospital or clinic, you can use:  An automated blood pressure machine at a pharmacy.  A home blood pressure monitor.  If you are between 47 years and 5 years old, ask your health  care provider if you should take aspirin to prevent strokes.  Have regular diabetes screenings. This involves taking a blood sample to check your fasting blood sugar level.  If you are at a normal weight and have a low risk for diabetes, have this test once every three years after 74 years of age.  If you are overweight and have a high risk for diabetes, consider being tested at a younger age or more often. Preventing infection Hepatitis B  If you have a higher risk for hepatitis B, you should be screened for this virus. You are considered at high risk for hepatitis B if:  You were born in a country where hepatitis B is common. Ask your health care provider which countries are considered high risk.  Your parents were born in a high-risk country, and you have not been immunized against hepatitis B (hepatitis B vaccine).  You have HIV or AIDS.  You use needles to inject street drugs.  You live with someone who has hepatitis B.  You have had sex with someone who has hepatitis B.  You get hemodialysis treatment.  You take certain medicines for conditions, including cancer, organ transplantation, and autoimmune conditions. Hepatitis C  Blood testing is recommended for:  Everyone born from [birth year] through 1963.  Anyone with known risk factors for hepatitis C. Sexually transmitted infections (STIs)  You should be screened for sexually transmitted infections (STIs) including gonorrhea and chlamydia if:  You are sexually active and are younger than 74 years of age.  You are older than 74 years of age and your health care provider tells you that you are at risk for this type of infection.  Your sexual activity has changed since you were last screened and you are at an increased risk for chlamydia or gonorrhea. Ask your health care provider if you are at risk.  If you do not have HIV, but are at risk, it may be recommended that you take a prescription medicine daily to prevent HIV  infection. This is called pre-exposure prophylaxis (PrEP). You are considered at risk if:  You are sexually active and do not regularly use condoms or know the HIV status of your partner(s).  You take drugs by injection.  You are sexually active with a partner who has HIV. Talk with your health care provider about whether you are at high risk of being infected with HIV. If you choose to begin PrEP, you should first be tested for HIV.  You should then be tested every 3 months for as long as you are taking PrEP. Pregnancy  If you are premenopausal and you may become pregnant, ask your health care provider about preconception counseling.  If you may become pregnant, take 400 to 800 micrograms (mcg) of folic acid every day.  If you want to prevent pregnancy, talk to your health care provider about birth control (contraception). Osteoporosis and menopause  Osteoporosis is a disease in which the bones lose minerals and strength with aging. This can result in serious bone fractures. Your risk for osteoporosis can be identified using a bone density scan.  If you are 63 years of age or older, or if you are at risk for osteoporosis and fractures, ask your health care provider if you should be screened.  Ask your health care provider whether you should take a calcium or vitamin D supplement to lower your risk for osteoporosis.  Menopause may have certain physical symptoms and risks.  Hormone replacement therapy may reduce some of these symptoms and risks. Talk to your health care provider about whether hormone replacement therapy is right for you. Follow these instructions at home:  Schedule regular health, dental, and eye exams.  Stay current with your immunizations.  Do not use any tobacco products including cigarettes, chewing tobacco, or electronic cigarettes.  If you are pregnant, do not drink alcohol.  If you are breastfeeding, limit how much and how often you drink alcohol.  Limit  alcohol intake to no more than 1 drink per day for nonpregnant women. One drink equals 12 ounces of beer, 5 ounces of wine, or 1 ounces of hard liquor.  Do not use street drugs.  Do not share needles.  Ask your health care provider for help if you need support or information about quitting drugs.  Tell your health care provider if you often feel depressed.  Tell your health care provider if you have ever been abused or do not feel safe at home. This information is not intended to replace advice given to you by your health care provider. Make sure you discuss any questions you have with your health care provider. Document Released: 10/15/2010 Document Revised: 09/07/2015 Document Reviewed: 01/03/2015 Elsevier Interactive Patient Education  2017 [employer].

## 2016-08-14 ENCOUNTER — Ambulatory Visit: Payer: Medicare Other | Admitting: Psychiatry

## 2016-08-14 ENCOUNTER — Telehealth: Payer: Self-pay

## 2016-08-14 NOTE — Telephone Encounter (Signed)
Medication management - Telephone call twice with patient this date to assess her status with medication refill as informed she had new orders sent into [facility] 04/24/16 for 90 days of Abilify and Effexor XR + 1 refills each.  Spoke with Lauren at [facility] to verify the orders were there and they stated they would send out medications today and should arrive this week.  Called patient back to inform orders were being sent from [facility] and for patient to call the office back if does not receive these this week.  Patient reminded of appointment now set for 09/30/16 and she stated she still has a 90 day refill for Xanax at her local pharmacy when needed.

## 2016-08-14 NOTE — Telephone Encounter (Signed)
pt needs refill to get to her appt for june 18th . pt was suppose to seen you on  08-14-16 but you called out .

## 2016-08-18 ENCOUNTER — Other Ambulatory Visit: Payer: Self-pay | Admitting: Family Medicine

## 2016-09-24 ENCOUNTER — Other Ambulatory Visit (INDEPENDENT_AMBULATORY_CARE_PROVIDER_SITE_OTHER): Payer: Medicare Other

## 2016-09-24 DIAGNOSIS — Z1211 Encounter for screening for malignant neoplasm of colon: Secondary | ICD-10-CM

## 2016-09-24 LAB — FECAL OCCULT BLOOD, IMMUNOCHEMICAL: Fecal Occult Bld: NEGATIVE

## 2016-09-30 ENCOUNTER — Encounter: Payer: Self-pay | Admitting: Psychiatry

## 2016-09-30 ENCOUNTER — Ambulatory Visit (INDEPENDENT_AMBULATORY_CARE_PROVIDER_SITE_OTHER): Payer: 59 | Admitting: Psychiatry

## 2016-09-30 VITALS — BP 118/76 | HR 102 | Temp 98.7°F | Wt 197.4 lb

## 2016-09-30 DIAGNOSIS — F331 Major depressive disorder, recurrent, moderate: Secondary | ICD-10-CM | POA: Diagnosis not present

## 2016-09-30 MED ORDER — VENLAFAXINE HCL ER 150 MG PO CP24: 150.0000 mg | ORAL_CAPSULE | Freq: Every day | ORAL | 1 refills | Status: DC

## 2016-09-30 MED ORDER — ARIPIPRAZOLE 5 MG PO TABS: 5.0000 mg | ORAL_TABLET | Freq: Every day | ORAL | 1 refills | Status: DC

## 2016-09-30 NOTE — Progress Notes (Signed)
Psychiatric MD Progress Note   Patient Identification: Faith Santiago MRN:  [id number] Date of Evaluation:  09/30/2016 Referral Source: PCP Chief Complaint:   Chief Complaint    Follow-up; Medication Refill     Visit Diagnosis:    ICD-10-CM   1. MDD (major depressive disorder), recurrent episode, moderate (HCC) F33.1    Diagnosis:   Patient Active Problem List   Diagnosis Date Noted  . Hypothyroidism [E03.9] 04/07/2015  . Vitamin D deficiency [E55.9] 04/02/2015  . Macrocytosis [D75.89] 03/28/2015  . Other fatigue [R53.83] 11/25/2014  . Advanced care planning/counseling discussion [Z71.89] 03/15/2014  . Health maintenance examination [Z00.00] 03/15/2014  . Postsurgical menopause [E89.40] 08/07/2011  . Medicare annual wellness visit, subsequent [Z00.00] 08/06/2011  . Osteoarthritis [M19.90] 06/07/2009  . Narcolepsy without cataplexy [G47.419] 05/31/2008  . HLD (hyperlipidemia) [E78.5] 06/03/2007  . MDD (major depressive disorder), recurrent episode, severe ([place]) [F33.2] 06/03/2007  . GERD [K21.9] 06/03/2007   History of Present Illness:   Patient is a 76 year old married female who presented for follow up.  She reported that she Has been doing well on her medications. Patient reported that she is concerned about her neighbor who has been her power of attorney and she was in the hospital recently. Patient reported that she feels tired taking care of her neighbor. She reported that she wants to enlist other neighbors to help with her hospitalization and her care as she feels that she is the only help and her neighbor is becoming manipulative. Patient reported that she has been asking her to pay her bills and to bring her food. Patient reported that her husband is also becoming upset with her as they paid the bills for her neighbor when she was in the hospital 8 years ago. Patient reported that she has been compliant with her medications and they have been helpful. She currently denied  having any side effects of the medications. She denied having any suicidal homicidal ideations or plans.  We discussed about her medications at length. She appeared calm and alert during the interview. No acute issues noted at this time.    She appeared calm and collective during the interview.  Elements:  Severity:  moderate. Associated Signs/Symptoms: Depression Symptoms:  fatigue, anxiety, loss of energy/fatigue, (Hypo) Manic Symptoms:  none Anxiety Symptoms:  Excessive Worry, Psychotic Symptoms:  none PTSD Symptoms: Negative NA  Past Medical History:  Past Medical History:  Diagnosis Date  . Anemia, unspecified   . Anxiety   . Arthritis    hands R>L, neck and lower back  . Bimalleolar fracture of left ankle 11/19/2013   [place]  . Depression   . Esophageal reflux   . History of osteopenia    DEXA WNL 2008, 2015  . History of uterine cancer 1999   s/p hysterectomy  . HLD (hyperlipidemia)   . Narcolepsy   . Thyroid disease     Past Surgical History:  Procedure Laterality Date  . bilateral catarct removal  2010  . COLONOSCOPY  05/2003   WNL (Dr Velora Heckler)  . DEXA  03/2014   WNL T score +3.9  . ORIF ANKLE FRACTURE Left 11/19/2013   Procedure: LEFT ANKLE FRACTURE OPEN TREATMENT BIMALLEOLAR ANKLE INCLUDES INTERNAL FIXATION ;  Surgeon: Johnny Bridge, MD  . RETINAL DETACHMENT REPAIR W/ SCLERAL BUCKLE LE Right 03/21/2016  . TOTAL ABDOMINAL HYSTERECTOMY  1999   uterine cancer   Family History:  Family History  Problem Relation Age of Onset  . Hypothyroidism Mother   .  Stroke Mother        ministroke  . Coronary artery disease Mother 19       s/p some heart surgery  . Cancer Father        prostate  . Diabetes Neg Hx    Social History:   Social History   Social History  . Marital status: Married    Spouse name: N/A  . Number of children: N/A  . Years of education: N/A   Social History Main Topics  . Smoking status: Never Smoker  . Smokeless tobacco: Never  Used  . Alcohol use 4.2 - 8.4 oz/week    7 - 10 Glasses of wine per week     Comment: 1 drink /day  . Drug use: No  . Sexual activity: No   Other Topics Concern  . 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 Has a son and daughter.  She enjoys quilting and belong to a [place] group. 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 She moved to [facility] and worked at [employer] in [place].  Moved to [place] in 1976. Worked in [employer] and [employer] x 26 years and retired from there.   Musculoskeletal: Strength & Muscle Tone: within normal limits Gait & Station: normal Patient leans: N/A  Psychiatric Specialty Exam: Medication Refill     ROS  Blood pressure 118/76, pulse (!) 102, temperature 98.7 F (37.1 C), temperature source Oral, weight 197 lb 6.4 oz (89.5 kg).Body mass index is 33.88 kg/m.  General Appearance: Casual and Fairly Groomed  Eye Contact:  Fair  Speech:  Clear and Coherent and Slow  Volume:  Normal  Mood:  Depressed  Affect:  Congruent and Depressed  Thought Process:  Goal Directed  Orientation:  Full (Time, Place, and Person)  Thought Content:  WDL  Suicidal Thoughts:  No  Homicidal Thoughts:  No  Memory:  Immediate;   Fair  Judgement:  Fair  Insight:  Fair  Psychomotor Activity:  Normal  Concentration:  Fair  Recall:  [employer] of Knowledge:Fair  Language: Fair  Akathisia:  No  Handed:  Right  AIMS (if indicated):    Assets:  Communication Skills Desire for Improvement Physical Health Social Support  ADL's:  Intact  Cognition: WNL  Sleep:     Is the patient at risk to self?  No. Has the patient been a risk to self in the past 6 months?  No. Has the patient been  a risk to self within the distant past?  No. Is the patient a risk to others?  No. Has the patient been a risk to others in the past 6 months?  No. Has the patient been a risk to others within the distant past?  No.  Allergies:   Allergies  Allergen Reactions  . Ciprofloxacin     REACTION: sun induced rash   Current Medications: Current Outpatient Prescriptions  Medication Sig Dispense Refill  . ALPRAZolam (XANAX) 0.25 MG tablet Take 1 tablet (0.25 mg total) by mouth at bedtime as needed for anxiety. 90 tablet 1  . ARIPiprazole (ABILIFY) 5 MG tablet Take 1 tablet (5 mg total) by mouth daily. 90 tablet  1  . aspirin 81 MG EC tablet Take 81 mg by mouth. Reported on 07/04/2015    . Cholecalciferol (VITAMIN D) 1000 UNITS capsule Take 1 capsule (1,000 Units total) by mouth daily.    . diclofenac (VOLTAREN) 75 MG EC tablet Take 1 tablet (75 mg total) by mouth 2 (two) times daily. 180 tablet 1  . levothyroxine (SYNTHROID, LEVOTHROID) 50 MCG tablet TAKE 1 TABLET (50 MCG TOTAL) BY MOUTH DAILY. 90 tablet 3  . Multiple Vitamin (MULTIVITAMIN) tablet Take 1 tablet by mouth daily.    Marland Kitchen venlafaxine XR (EFFEXOR-XR) 150 MG 24 hr capsule Take 1 capsule (150 mg total) by mouth daily with breakfast. 90 capsule 1  . vitamin B-12 (CYANOCOBALAMIN) 1000 MCG tablet Take 1,000 mcg by mouth daily.     No current facility-administered medications for this visit.     Previous Psychotropic Medications:  Prozac- worked well.  Wellbutrin Alprazolam- many years Celexa - no change   Substance Abuse History in the last 12 months:  No.  Consequences of Substance Abuse: Negative NA  Medical Decision Making:  Review of Psycho-Social Stressors (1)  Treatment Plan Summary: Medication management   Discussed with patient or the medications treatment risks benefits and alternatives Continue  Abilify 5 mg to help as an add-on to her antidepressant medications Continue  Effexor XR  150 mg and patient will take 1  pills in the morning.  Advised her to start taking alprazolam 0.25 mg  on a when necessary basis Medications refilled Patient will follow up in 3  months or earlier depending on her symptoms    More than 50% of the time spent in psychoeducation, counseling and coordination of care.    This note was generated in part or whole with voice recognition software. Voice regonition is usually quite accurate but there are transcription errors that can and very often do occur. I apologize for any typographical errors that were not detected and corrected.     Rainey Pines, MD  6/18/20182:35 PM

## 2016-10-02 ENCOUNTER — Other Ambulatory Visit: Payer: Self-pay | Admitting: Psychiatry

## 2016-11-28 ENCOUNTER — Other Ambulatory Visit: Payer: Self-pay | Admitting: Psychiatry

## 2016-12-30 ENCOUNTER — Telehealth: Payer: Self-pay

## 2016-12-30 ENCOUNTER — Ambulatory Visit: Payer: Medicare Other | Admitting: Psychiatry

## 2016-12-30 NOTE — Telephone Encounter (Signed)
pt was suppose to be seen today but dr. Gretel Acre did not come into work todayl pt needs refills on her medication pt has r/s her appt to 02-10-17.  Disp Refills Start End   ARIPiprazole (ABILIFY) 5 MG tablet 90 tablet 1 09/30/2016    Sig - Route: Take 1 tablet (5 mg total) by mouth daily. - Oral   Sent to pharmacy as: ARIPiprazole (ABILIFY) 5 MG tablet   E-Prescribing Status: Receipt confirmed by pharmacy (09/30/2016 2:34 PM EDT)      Disp Refills Start End   ALPRAZolam (XANAX) 0.25 MG tablet 90 tablet 1 04/24/2016    Sig - Route: Take 1 tablet (0.25 mg total) by mouth at bedtime as needed for anxiety. - Oral   Class: Print     Disp Refills Start End   venlafaxine XR (EFFEXOR-XR) 150 MG 24 hr capsule 90 capsule 1 09/30/2016    Sig - Route: Take 1 capsule (150 mg total) by mouth daily with breakfast. - Oral   Sent to pharmacy as: venlafaxine XR (EFFEXOR-XR) 150 MG 24 hr capsule   E-Prescribing Status: Receipt confirmed by pharmacy (09/30/2016 2:34 PM EDT)

## 2016-12-30 NOTE — Telephone Encounter (Signed)
According to the chart, medication was filled in June for 90 days with one refill for effexor and abilify. She should have refill for each medication. Will defer xanax order to dr. Gretel Acre.

## 2017-01-16 NOTE — Telephone Encounter (Signed)
SEE PREVIOUS MESSAGE. PT NEEDS REFILLS ON XANAX.

## 2017-02-10 ENCOUNTER — Telehealth: Payer: Self-pay

## 2017-02-10 ENCOUNTER — Encounter: Payer: Self-pay | Admitting: Psychiatry

## 2017-02-10 ENCOUNTER — Ambulatory Visit (INDEPENDENT_AMBULATORY_CARE_PROVIDER_SITE_OTHER): Payer: Medicare Other | Admitting: Psychiatry

## 2017-02-10 VITALS — BP 125/80 | HR 116 | Temp 98.0°F | Wt 201.0 lb

## 2017-02-10 DIAGNOSIS — F331 Major depressive disorder, recurrent, moderate: Secondary | ICD-10-CM

## 2017-02-10 MED ORDER — VENLAFAXINE HCL ER 150 MG PO CP24: 150.0000 mg | ORAL_CAPSULE | Freq: Every day | ORAL | 1 refills | Status: DC

## 2017-02-10 MED ORDER — ALPRAZOLAM 0.25 MG PO TABS: 0.2500 mg | ORAL_TABLET | Freq: Every evening | ORAL | 2 refills | Status: DC | PRN

## 2017-02-10 MED ORDER — ARIPIPRAZOLE 5 MG PO TABS: 5.0000 mg | ORAL_TABLET | Freq: Every day | ORAL | 1 refills | Status: DC

## 2017-02-10 NOTE — Telephone Encounter (Signed)
faxed and confirmed rx xanax,.25mg  id [id number] order # [id number] #[id number] refill2

## 2017-02-10 NOTE — Progress Notes (Signed)
Psychiatric MD Progress Note   Patient Identification: Faith Santiago MRN:  [id number] Date of Evaluation:  02/10/2017 Referral Source: PCP Chief Complaint:   Chief Complaint    Follow-up; Medication Refill; Depression     Visit Diagnosis:    ICD-10-CM   1. MDD (major depressive disorder), recurrent episode, moderate (HCC) F33.1    Diagnosis:   Patient Active Problem List   Diagnosis Date Noted  . Hypothyroidism [E03.9] 04/07/2015  . Vitamin D deficiency [E55.9] 04/02/2015  . Macrocytosis [D75.89] 03/28/2015  . Other fatigue [R53.83] 11/25/2014  . Advanced care planning/counseling discussion [Z71.89] 03/15/2014  . Health maintenance examination [Z00.00] 03/15/2014  . Postsurgical menopause [E89.40] 08/07/2011  . Medicare annual wellness visit, subsequent [Z00.00] 08/06/2011  . Osteoarthritis [M19.90] 06/07/2009  . Narcolepsy without cataplexy [G47.419] 05/31/2008  . HLD (hyperlipidemia) [E78.5] 06/03/2007  . MDD (major depressive disorder), recurrent episode, severe ([place]) [F33.2] 06/03/2007  . GERD [K21.9] 06/03/2007   History of Present Illness:   Patient is a 76 year old married female who presented for follow up.  She Was sad and tearful during the interview. She reported that she has been depressed since the end of August and remained depressed or September. She reported that she does not know the reason for the same. Patient stated that she was supposed to go on medication September but she decided not to go to her sister's house in [place] as her husband was sick. However she was able to recover from her depressive symptoms. She was tearful during the interview. She reported that she does not have any acute stressors at this time. She is compliant with her medications. She stated that she takes them as prescribed. She denied having any suicidal homicidal ideations or plans. We discussed about her medications. She stated that she does not want to have her medications adjusted at  this time. She sleeps well and has good relationship with her husband.        She appeared calm and collective during the interview.  Elements:  Severity:  moderate. Associated Signs/Symptoms: Depression Symptoms:  fatigue, anxiety, loss of energy/fatigue, (Hypo) Manic Symptoms:  none Anxiety Symptoms:  Excessive Worry, Psychotic Symptoms:  none PTSD Symptoms: Negative NA  Past Medical History:  Past Medical History:  Diagnosis Date  . Anemia, unspecified   . Anxiety   . Arthritis    hands R>L, neck and lower back  . Bimalleolar fracture of left ankle 11/19/2013   [place]  . Depression   . Esophageal reflux   . History of osteopenia    DEXA WNL 2008, 2015  . History of uterine cancer 1999   s/p hysterectomy  . HLD (hyperlipidemia)   . Narcolepsy   . Thyroid disease     Past Surgical History:  Procedure Laterality Date  . bilateral catarct removal  2010  . COLONOSCOPY  05/2003   WNL (Dr Velora Heckler)  . DEXA  03/2014   WNL T score +3.9  . ORIF ANKLE FRACTURE Left 11/19/2013   Procedure: LEFT ANKLE FRACTURE OPEN TREATMENT BIMALLEOLAR ANKLE INCLUDES INTERNAL FIXATION ;  Surgeon: Johnny Bridge, MD  . RETINAL DETACHMENT REPAIR W/ SCLERAL BUCKLE LE Right 03/21/2016  . TOTAL ABDOMINAL HYSTERECTOMY  1999   uterine cancer   Family History:  Family History  Problem Relation Age of Onset  . Hypothyroidism Mother   . Stroke Mother        ministroke  . Coronary artery disease Mother 46       s/p some heart surgery  .  Cancer Father        prostate  . Diabetes Neg Hx    Social History:   Social History   Social History  . Marital status: Married    Spouse name: N/A  . Number of children: N/A  . Years of education: N/A   Social History Main Topics  . Smoking status: Never Smoker  . Smokeless tobacco: Never Used  . Alcohol use 4.2 - 8.4 oz/week    7 - 10 Glasses of wine per week     Comment: 1 drink /day  . Drug use: No  . Sexual activity: No   Other Topics  Concern  . 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 Has a son and daughter.  She enjoys quilting and belong to a [place] group. 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 She moved to [facility] and worked at [employer] in [place].  Moved to [place] in 1976. Worked in [employer] and [employer] x 26 years and retired from there.   Musculoskeletal: Strength & Muscle Tone: within normal limits Gait & Station: normal Patient leans: N/A  Psychiatric Specialty Exam: Medication Refill     Review of Systems  Psychiatric/Behavioral: Positive for depression.    Blood pressure 125/80, pulse (!) 116, temperature 98 F (36.7 C), temperature source Oral, weight 201 lb (91.2 kg).Body mass index is 34.5 kg/m.  General Appearance: Casual and Fairly Groomed  Eye Contact:  Fair  Speech:  Clear and Coherent and Slow  Volume:  Normal  Mood:  Depressed  Affect:  Congruent and Depressed  Thought Process:  Goal Directed  Orientation:  Full (Time, Place, and Person)  Thought Content:  WDL  Suicidal Thoughts:  No  Homicidal Thoughts:  No  Memory:  Immediate;   Fair  Judgement:  Fair  Insight:  Fair  Psychomotor Activity:  Normal  Concentration:  Fair  Recall:  [employer] of Knowledge:Fair  Language: Fair  Akathisia:  No  Handed:  Right  AIMS (if indicated):    Assets:  Communication Skills Desire for Improvement Physical Health Social Support  ADL's:  Intact  Cognition: WNL  Sleep:     Is the patient at risk to self?  No. Has the patient been a risk to self in the past 6 months?  No. Has the patient been a risk to self within the distant past?  No. Is the patient a risk to others?  No. Has the patient  been a risk to others in the past 6 months?  No. Has the patient been a risk to others within the distant past?  No.  Allergies:   Allergies  Allergen Reactions  . Ciprofloxacin     REACTION: sun induced rash   Current Medications: Current Outpatient Prescriptions  Medication Sig Dispense Refill  . ALPRAZolam (XANAX) 0.25 MG tablet Take 1 tablet (0.25 mg total) by mouth at bedtime as needed for anxiety. 30 tablet 2  . ARIPiprazole (ABILIFY) 5 MG tablet Take 1 tablet (5 mg total) by mouth daily. 90 tablet 1  . aspirin 81 MG EC tablet Take 81 mg by mouth. Reported on 07/04/2015    . Cholecalciferol (VITAMIN  D) 1000 UNITS capsule Take 1 capsule (1,000 Units total) by mouth daily.    . diclofenac (VOLTAREN) 75 MG EC tablet Take 1 tablet (75 mg total) by mouth 2 (two) times daily. 180 tablet 1  . levothyroxine (SYNTHROID, LEVOTHROID) 50 MCG tablet TAKE 1 TABLET (50 MCG TOTAL) BY MOUTH DAILY. 90 tablet 3  . Multiple Vitamin (MULTIVITAMIN) tablet Take 1 tablet by mouth daily.    Marland Kitchen venlafaxine XR (EFFEXOR-XR) 150 MG 24 hr capsule Take 1 capsule (150 mg total) by mouth daily with breakfast. 90 capsule 1  . vitamin B-12 (CYANOCOBALAMIN) 1000 MCG tablet Take 1,000 mcg by mouth daily.     No current facility-administered medications for this visit.     Previous Psychotropic Medications:  Prozac- worked well.  Wellbutrin Alprazolam- many years Celexa - no change   Substance Abuse History in the last 12 months:  No.  Consequences of Substance Abuse: Negative NA  Medical Decision Making:  Review of Psycho-Social Stressors (1)  Treatment Plan Summary: Medication management   Discussed with patient or the medications treatment risks benefits and alternatives Continue  Abilify 5 mg to help as an add-on to her antidepressant medications Continue  Effexor XR  150 mg and patient will take 1 pills in the morning.  Advised her to start taking alprazolam 0.25 mg  on a when necessary  basis Medications refilled Patient will follow up in 3  months or earlier depending on her symptoms    More than 50% of the time spent in psychoeducation, counseling and coordination of care.    This note was generated in part or whole with voice recognition software. Voice regonition is usually quite accurate but there are transcription errors that can and very often do occur. I apologize for any typographical errors that were not detected and corrected.     Rainey Pines, MD  10/29/201812:28 PM

## 2017-03-24 ENCOUNTER — Ambulatory Visit: Payer: Medicare Other | Admitting: Psychiatry

## 2017-03-31 ENCOUNTER — Encounter: Payer: Self-pay | Admitting: Psychiatry

## 2017-03-31 ENCOUNTER — Ambulatory Visit: Payer: Medicare Other | Admitting: Psychiatry

## 2017-03-31 VITALS — BP 122/72 | HR 92 | Ht 64.5 in | Wt 199.0 lb

## 2017-03-31 DIAGNOSIS — F331 Major depressive disorder, recurrent, moderate: Secondary | ICD-10-CM | POA: Diagnosis not present

## 2017-03-31 MED ORDER — ARIPIPRAZOLE 10 MG PO TABS: 10.0000 mg | ORAL_TABLET | Freq: Every day | ORAL | 0 refills | Status: DC

## 2017-03-31 NOTE — Progress Notes (Signed)
Psychiatric MD Progress Note   Patient Identification: Faith Santiago MRN:  [id number] Date of Evaluation:  03/31/2017 Referral Source: PCP Chief Complaint:    Visit Diagnosis:    ICD-10-CM   1. MDD (major depressive disorder), recurrent episode, moderate (HCC) F33.1    Diagnosis:   Patient Active Problem List   Diagnosis Date Noted  . Hypothyroidism [E03.9] 04/07/2015  . Vitamin D deficiency [E55.9] 04/02/2015  . Macrocytosis [D75.89] 03/28/2015  . Other fatigue [R53.83] 11/25/2014  . Advanced care planning/counseling discussion [Z71.89] 03/15/2014  . Health maintenance examination [Z00.00] 03/15/2014  . Postsurgical menopause [E89.40] 08/07/2011  . Medicare annual wellness visit, subsequent [Z00.00] 08/06/2011  . Osteoarthritis [M19.90] 06/07/2009  . Narcolepsy without cataplexy [G47.419] 05/31/2008  . HLD (hyperlipidemia) [E78.5] 06/03/2007  . MDD (major depressive disorder), recurrent episode, severe ([place]) [F33.2] 06/03/2007  . GERD [K21.9] 06/03/2007   History of Present Illness:   Patient is a 76 year old married female who presented for follow up.  She reported that she has started improving on her medications. She reported that the Abilify is helping her and she is interested in going higher on the dose of the medication. She stated that she takes Xanax on a when necessary basis and it helps her anxiety. She denied having any suicidal ideations or plans. She stated that her husband is getting better and he has lost some weight as well. She is planning to go to her daughter's house during the holidays. She does not have any acute symptoms at this time. She appeared calm and alert during the interview. She denied having any perceptual disturbances. We discussed about the medications. She is also going to the primary care physician for her wellness check on a regular basis.           She appeared calm and collective during the interview.  Elements:  Severity:   moderate. Associated Signs/Symptoms: Depression Symptoms:  fatigue, anxiety, loss of energy/fatigue, (Hypo) Manic Symptoms:  none Anxiety Symptoms:  Excessive Worry, Psychotic Symptoms:  none PTSD Symptoms: Negative NA  Past Medical History:  Past Medical History:  Diagnosis Date  . Anemia, unspecified   . Anxiety   . Arthritis    hands R>L, neck and lower back  . Bimalleolar fracture of left ankle 11/19/2013   [place]  . Depression   . Esophageal reflux   . History of osteopenia    DEXA WNL 2008, 2015  . History of uterine cancer 1999   s/p hysterectomy  . HLD (hyperlipidemia)   . Narcolepsy   . Thyroid disease     Past Surgical History:  Procedure Laterality Date  . bilateral catarct removal  2010  . COLONOSCOPY  05/2003   WNL (Dr Velora Heckler)  . DEXA  03/2014   WNL T score +3.9  . ORIF ANKLE FRACTURE Left 11/19/2013   Procedure: LEFT ANKLE FRACTURE OPEN TREATMENT BIMALLEOLAR ANKLE INCLUDES INTERNAL FIXATION ;  Surgeon: Johnny Bridge, MD  . RETINAL DETACHMENT REPAIR W/ SCLERAL BUCKLE LE Right 03/21/2016  . TOTAL ABDOMINAL HYSTERECTOMY  1999   uterine cancer   Family History:  Family History  Problem Relation Age of Onset  . Hypothyroidism Mother   . Stroke Mother        ministroke  . Coronary artery disease Mother 21       s/p some heart surgery  . Cancer Father        prostate  . Diabetes Neg Hx    Social History:   Social History  Socioeconomic History  . Marital status: Married    Spouse name: None  . Number of children: None  . Years of education: None  . Highest education level: None  Social Needs  . Financial resource strain: None  . Food insecurity - worry: None  . Food insecurity - inability: None  . Transportation needs - medical: None  . Transportation needs - non-medical: None  Occupational History  . None  Tobacco Use  . Smoking status: Never Smoker  . Smokeless tobacco: Never Used  Substance and Sexual Activity  . Alcohol use: Yes     Alcohol/week: 4.2 - 7.2 oz    Types: 7 - 10 Glasses of wine per week    Comment: 1 drink /day  . Drug use: No  . Sexual activity: No  Other Topics Concern  . None  Social History Narrative   caffeine: couple cups/day   Lives with husband, 2 grown children, 1 cat   Occupation: retired, was OT for [employer]   Activity: no regular exercise, to start silver sneakers   Diet: overeating, good amt water, vegetables daily, occasional fruits   Additional Social History:  Married x 50 years. Has a son and daughter.  She enjoys quilting and belong to a [place] group. 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 She moved to [facility] and worked at [employer] in [place].  Moved to [place] in 1976. Worked in [employer] and [employer] x 26 years and retired from there.   Musculoskeletal: Strength & Muscle Tone: within normal limits Gait & Station: normal Patient leans: N/A  Psychiatric Specialty Exam: Medication Refill   Depression          Review of Systems  Psychiatric/Behavioral: Positive for depression.    Blood pressure 122/72, pulse 92, height 5' 4.5" (1.638 m), weight 199 lb (90.3 kg), SpO2 96 %.Body mass index is 33.63 kg/m.  General Appearance: Casual and Fairly Groomed  Eye Contact:  Fair  Speech:  Clear and Coherent and Slow  Volume:  Normal  Mood:  Depressed  Affect:  Congruent and Depressed  Thought Process:  Goal Directed  Orientation:  Full (Time, Place, and Person)  Thought Content:  WDL  Suicidal Thoughts:  No  Homicidal Thoughts:  No  Memory:  Immediate;   Fair  Judgement:  Fair  Insight:  Fair  Psychomotor Activity:  Normal  Concentration:  Fair  Recall:  [employer] of Knowledge:Fair  Language: Fair  Akathisia:  No  Handed:  Right  AIMS (if indicated):    Assets:  Communication Skills Desire for Improvement Physical Health Social Support  ADL's:  Intact  Cognition: WNL  Sleep:     Is the patient at risk to self?  No. Has the patient been a risk to  self in the past 6 months?  No. Has the patient been a risk to self within the distant past?  No. Is the patient a risk to others?  No. Has the patient been a risk to others in the past 6 months?  No. Has the patient been a risk to others within the distant past?  No.  Allergies:   Allergies  Allergen Reactions  . Ciprofloxacin     REACTION: sun induced rash   Current Medications: Current Outpatient Medications  Medication Sig Dispense Refill  . ALPRAZolam (XANAX) 0.25 MG tablet Take 1 tablet (0.25 mg total) by mouth at bedtime as needed for anxiety. 30 tablet 2  . ARIPiprazole (ABILIFY) 5  MG tablet Take 1 tablet (5 mg total) by mouth daily. 90 tablet 1  . aspirin 81 MG EC tablet Take 81 mg by mouth. Reported on 07/04/2015    . Cholecalciferol (VITAMIN D) 1000 UNITS capsule Take 1 capsule (1,000 Units total) by mouth daily.    . diclofenac (VOLTAREN) 75 MG EC tablet Take 1 tablet (75 mg total) by mouth 2 (two) times daily. 180 tablet 1  . levothyroxine (SYNTHROID, LEVOTHROID) 50 MCG tablet TAKE 1 TABLET (50 MCG TOTAL) BY MOUTH DAILY. 90 tablet 3  . Multiple Vitamin (MULTIVITAMIN) tablet Take 1 tablet by mouth daily.    Marland Kitchen venlafaxine XR (EFFEXOR-XR) 150 MG 24 hr capsule Take 1 capsule (150 mg total) by mouth daily with breakfast. 90 capsule 1  . vitamin B-12 (CYANOCOBALAMIN) 1000 MCG tablet Take 1,000 mcg by mouth daily.     No current facility-administered medications for this visit.     Previous Psychotropic Medications:  Prozac- worked well.  Wellbutrin Alprazolam- many years Celexa - no change   Substance Abuse History in the last 12 months:  No.  Consequences of Substance Abuse: Negative NA  Medical Decision Making:  Review of Psycho-Social Stressors (1)  Treatment Plan Summary: Medication management   Discussed with patient or the medications treatment risks benefits and alternatives Continue  Abilify 10 mg to help as an add-on to her antidepressant  medications Continue  Effexor XR  150 mg and patient will take 1 pills in the morning.  Advised her to start taking alprazolam 0.25 mg  on a when necessary basis. Patient has supply of the medication.  Patient will follow up in  2 months or earlier depending on her symptoms    More than 50% of the time spent in psychoeducation, counseling and coordination of care.    This note was generated in part or whole with voice recognition software. Voice regonition is usually quite accurate but there are transcription errors that can and very often do occur. I apologize for any typographical errors that were not detected and corrected.     Rainey Pines, MD  12/17/20183:50 PM

## 2017-04-06 ENCOUNTER — Other Ambulatory Visit: Payer: Self-pay | Admitting: Family Medicine

## 2017-05-19 ENCOUNTER — Encounter: Payer: Self-pay | Admitting: Psychiatry

## 2017-05-19 ENCOUNTER — Ambulatory Visit: Payer: Medicare Other | Admitting: Psychiatry

## 2017-05-19 DIAGNOSIS — F331 Major depressive disorder, recurrent, moderate: Secondary | ICD-10-CM

## 2017-05-19 MED ORDER — ARIPIPRAZOLE 10 MG PO TABS: 10.0000 mg | ORAL_TABLET | Freq: Every day | ORAL | 3 refills | Status: DC

## 2017-05-19 MED ORDER — ALPRAZOLAM 0.25 MG PO TABS: 0.2500 mg | ORAL_TABLET | Freq: Every evening | ORAL | 2 refills | Status: DC | PRN

## 2017-05-19 NOTE — Progress Notes (Signed)
Psychiatric MD Progress Note   Patient Identification: Faith Santiago MRN:  [id number] Date of Evaluation:  05/19/2017 Referral Source: PCP Chief Complaint:    Visit Diagnosis:    ICD-10-CM   1. MDD (major depressive disorder), recurrent episode, moderate (HCC) F33.1    Diagnosis:   Patient Active Problem List   Diagnosis Date Noted  . Hypothyroidism [E03.9] 04/07/2015  . Vitamin D deficiency [E55.9] 04/02/2015  . Macrocytosis [D75.89] 03/28/2015  . Other fatigue [R53.83] 11/25/2014  . Advanced care planning/counseling discussion [Z71.89] 03/15/2014  . Health maintenance examination [Z00.00] 03/15/2014  . Postsurgical menopause [E89.40] 08/07/2011  . Medicare annual wellness visit, subsequent [Z00.00] 08/06/2011  . Osteoarthritis [M19.90] 06/07/2009  . Narcolepsy without cataplexy [G47.419] 05/31/2008  . HLD (hyperlipidemia) [E78.5] 06/03/2007  . MDD (major depressive disorder), recurrent episode, severe ([place]) [F33.2] 06/03/2007  . GERD [K21.9] 06/03/2007   History of Present Illness:   Patient is a 77 year old married female who presented for follow up.  She reported that she was very busy last month as she went to the be weaving  group in [place] with her friends. She reported that she enjoyed her trip with her friends for 2 weeks. After she came back her brothers came to visit her. She reported that she has been doing well and enjoyed her time with her friends. Patient reported that she has been stable on the current combination of medications and does not have any acute symptoms. She currently denied having any suicidal homicidal ideations or plans. She reported that the current combination of medication is helping her and she takes Xanax only on the when necessary basis when she has increase in her anxiety symptoms. She sleeps well at night. She does not have any side effects of the medications at this time. She takes Abilify 10 mg on a daily basis.   Patient appeared calm and  alert during the interview. She denied having any perceptual disturbances. Her memory appears fine. She is able to contract for safety at this time.     She appeared calm and collective during the interview.   Elements:  Severity:  moderate. Associated Signs/Symptoms: Depression Symptoms:  fatigue, anxiety, loss of energy/fatigue, (Hypo) Manic Symptoms:  none Anxiety Symptoms:  Excessive Worry, Psychotic Symptoms:  none PTSD Symptoms: Negative NA  Past Medical History:  Past Medical History:  Diagnosis Date  . Anemia, unspecified   . Anxiety   . Arthritis    hands R>L, neck and lower back  . Bimalleolar fracture of left ankle 11/19/2013   [place]  . Depression   . Esophageal reflux   . History of osteopenia    DEXA WNL 2008, 2015  . History of uterine cancer 1999   s/p hysterectomy  . HLD (hyperlipidemia)   . Narcolepsy   . Thyroid disease     Past Surgical History:  Procedure Laterality Date  . bilateral catarct removal  2010  . COLONOSCOPY  05/2003   WNL (Dr Velora Heckler)  . DEXA  03/2014   WNL T score +3.9  . ORIF ANKLE FRACTURE Left 11/19/2013   Procedure: LEFT ANKLE FRACTURE OPEN TREATMENT BIMALLEOLAR ANKLE INCLUDES INTERNAL FIXATION ;  Surgeon: Johnny Bridge, MD  . RETINAL DETACHMENT REPAIR W/ SCLERAL BUCKLE LE Right 03/21/2016  . TOTAL ABDOMINAL HYSTERECTOMY  1999   uterine cancer   Family History:  Family History  Problem Relation Age of Onset  . Hypothyroidism Mother   . Stroke Mother        ministroke  .  Coronary artery disease Mother 58       s/p some heart surgery  . Cancer Father        prostate  . Diabetes Neg Hx    Social History:   Social History   Socioeconomic History  . Marital status: Married    Spouse name: Not on file  . Number of children: Not on file  . Years of education: Not on file  . Highest education level: Not on file  Social Needs  . Financial resource strain: Not on file  . Food insecurity - worry: Not on file  . Food  insecurity - inability: Not on file  . Transportation needs - medical: Not on file  . Transportation needs - non-medical: Not on file  Occupational History  . Not on file  Tobacco Use  . Smoking status: Never Smoker  . Smokeless tobacco: Never Used  Substance and Sexual Activity  . Alcohol use: Yes    Alcohol/week: 4.2 - 7.2 oz    Types: 7 - 10 Glasses of wine per week    Comment: 1 drink /day  . Drug use: No  . Sexual activity: No  Other Topics Concern  . 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 Has a son and daughter.  She enjoys quilting and belong to a [place] group. 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 She moved to [facility] and worked at [employer] in [place].  Moved to [place] in 1976. Worked in [employer] and [employer] x 26 years and retired from there.   Musculoskeletal: Strength & Muscle Tone: within normal limits Gait & Station: normal Patient leans: N/A  Psychiatric Specialty Exam: Medication Refill   Depression          Review of Systems  Psychiatric/Behavioral: Positive for depression.    There were no vitals taken for this visit.There is no height or weight on file to calculate BMI.  General Appearance: Casual and Fairly Groomed  Eye Contact:  Fair  Speech:  Clear and Coherent and Slow  Volume:  Normal  Mood:  Depressed  Affect:  Congruent and Depressed  Thought Process:  Goal Directed  Orientation:  Full (Time, Place, and Person)  Thought Content:  WDL  Suicidal Thoughts:  No  Homicidal Thoughts:  No  Memory:  Immediate;   Fair  Judgement:  Fair  Insight:  Fair  Psychomotor Activity:  Normal  Concentration:  Fair  Recall:  [employer] of  Knowledge:Fair  Language: Fair  Akathisia:  No  Handed:  Right  AIMS (if indicated):    Assets:  Communication Skills Desire for Improvement Physical Health Social Support  ADL's:  Intact  Cognition: WNL  Sleep:     Is the patient at risk to self?  No. Has the patient been a risk to self in the past 6 months?  No. Has the patient been a risk to self within the distant past?  No. Is the patient a risk to others?  No. Has the patient been a risk to others in the past 6 months?  No. Has the patient been a risk to others within the distant past?  No.  Allergies:   Allergies  Allergen Reactions  . Ciprofloxacin     REACTION: sun induced rash   Current Medications: Current Outpatient Medications  Medication Sig Dispense Refill  . ALPRAZolam (XANAX) 0.25 MG tablet Take 1 tablet (0.25 mg total) by mouth at bedtime as needed for anxiety. 30 tablet 2  . ARIPiprazole (ABILIFY) 10 MG tablet Take 1 tablet (10 mg total) by mouth daily. Pt has supply 90 tablet 0  . aspirin 81 MG EC tablet Take 81 mg by mouth. Reported on 07/04/2015    . Cholecalciferol (VITAMIN D) 1000 UNITS capsule Take 1 capsule (1,000 Units total) by mouth daily.    . diclofenac (VOLTAREN) 75 MG EC tablet TAKE 1 TABLET BY MOUTH TWO  TIMES DAILY 180 tablet 0  . levothyroxine (SYNTHROID, LEVOTHROID) 50 MCG tablet TAKE 1 TABLET (50 MCG TOTAL) BY MOUTH DAILY. 90 tablet 3  . Multiple Vitamin (MULTIVITAMIN) tablet Take 1 tablet by mouth daily.    Marland Kitchen venlafaxine XR (EFFEXOR-XR) 150 MG 24 hr capsule Take 1 capsule (150 mg total) by mouth daily with breakfast. 90 capsule 1  . vitamin B-12 (CYANOCOBALAMIN) 1000 MCG tablet Take 1,000 mcg by mouth daily.     No current facility-administered medications for this visit.     Previous Psychotropic Medications:  Prozac- worked well.  Wellbutrin Alprazolam- many years Celexa - no change   Substance Abuse History in the last 12 months:  No.  Consequences of Substance  Abuse: Negative NA  Medical Decision Making:  Review of Psycho-Social Stressors (1)  Treatment Plan Summary: Medication management   Discussed with patient or the medications treatment risks benefits and alternatives Continue  Abilify 10 mg to help as an add-on to her antidepressant medications Continue  Effexor XR  150 mg and patient will take 1 pills in the morning.  Advised her to start taking alprazolam 0.25 mg  on a when necessary basis. Patient will follow up in  3  months or earlier depending on her symptoms    More than 50% of the time spent in psychoeducation, counseling and coordination of care.    This note was generated in part or whole with voice recognition software. Voice regonition is usually quite accurate but there are transcription errors that can and very often do occur. I apologize for any typographical errors that were not detected and corrected.     Rainey Pines, MD  2/4/20193:53 PM

## 2017-07-17 ENCOUNTER — Other Ambulatory Visit: Payer: Self-pay | Admitting: Psychiatry

## 2017-07-21 NOTE — Telephone Encounter (Signed)
received a refill request for the effexor xr . pt was last seen on  05-19-17 and next appt is  08-11-17   venlafaxine XR (EFFEXOR-XR) 150 MG 24 hr capsule  Medication  Date: 02/10/2017 Department: [facility] Ordering/Authorizing: Rainey Pines, MD  Order Providers   Prescribing Provider Encounter Provider  Rainey Pines, MD Rainey Pines, MD  Outpatient Medication Detail    Disp Refills Start End   venlafaxine XR (EFFEXOR-XR) 150 MG 24 hr capsule 90 capsule 1 02/10/2017    Sig - Route: Take 1 capsule (150 mg total) by mouth daily with breakfast. - Oral   Sent to pharmacy as: venlafaxine XR (EFFEXOR-XR) 150 MG 24 hr capsule   E-Prescribing Status: Receipt confirmed by pharmacy (02/10/2017 11:38 AM EDT)

## 2017-08-11 ENCOUNTER — Encounter: Payer: Self-pay | Admitting: Psychiatry

## 2017-08-11 ENCOUNTER — Ambulatory Visit: Payer: Medicare Other | Admitting: Psychiatry

## 2017-08-11 VITALS — BP 123/83 | HR 94 | Temp 97.6°F | Wt 212.4 lb

## 2017-08-11 DIAGNOSIS — F331 Major depressive disorder, recurrent, moderate: Secondary | ICD-10-CM

## 2017-08-11 MED ORDER — VENLAFAXINE HCL ER 150 MG PO CP24: 150.0000 mg | ORAL_CAPSULE | Freq: Every day | ORAL | 1 refills | Status: DC

## 2017-08-11 MED ORDER — ARIPIPRAZOLE 10 MG PO TABS: 10.0000 mg | ORAL_TABLET | Freq: Every day | ORAL | 3 refills | Status: DC

## 2017-08-11 MED ORDER — ALPRAZOLAM 0.25 MG PO TABS: 0.2500 mg | ORAL_TABLET | Freq: Every evening | ORAL | 2 refills | Status: DC | PRN

## 2017-08-11 NOTE — Progress Notes (Signed)
Psychiatric MD Progress Note   Patient Identification: Faith Santiago MRN:  [id number] Date of Evaluation:  08/11/2017 Referral Source: PCP Chief Complaint:   Chief Complaint    Follow-up; Medication Refill     Visit Diagnosis:    ICD-10-CM   1. MDD (major depressive disorder), recurrent episode, moderate (HCC) F33.1    Diagnosis:   Patient Active Problem List   Diagnosis Date Noted  . Hypothyroidism [E03.9] 04/07/2015  . Vitamin D deficiency [E55.9] 04/02/2015  . Macrocytosis [D75.89] 03/28/2015  . Other fatigue [R53.83] 11/25/2014  . Advanced care planning/counseling discussion [Z71.89] 03/15/2014  . Health maintenance examination [Z00.00] 03/15/2014  . Postsurgical menopause [E89.40] 08/07/2011  . Medicare annual wellness visit, subsequent [Z00.00] 08/06/2011  . Osteoarthritis [M19.90] 06/07/2009  . Narcolepsy without cataplexy [G47.419] 05/31/2008  . HLD (hyperlipidemia) [E78.5] 06/03/2007  . MDD (major depressive disorder), recurrent episode, severe ([place]) [F33.2] 06/03/2007  . GERD [K21.9] 06/03/2007   History of Present Illness:   Patient is a 77 year old married female who presented for follow up.  She reported that her husband was planning to come with her for today's appointment but 1 of the neighbors showed up as they were looking for some help so he decided not to come.  She reported that he is concerned due to her behavior.  She reported that she has been hoarding at home and has not been cleaning.  She spends most of the time reading books or sleeping.  She reported that her husband is getting frustrated and he wants her to start cleaning around the house as she has been doing some quilting in the past but she has not cleaned after her.  She has also several things collected as she does not want to throw anything.  We discussed in detail about giving away her stuff to the church or to the local charities in the area and she agreed with the plan.  She reported that 1 of  her neighbors was busy with her family but now she is back and she might help her pack  their stuff in the boxes.  She reported that she will start working on giving away her stuff and belongings.  She reported that it makes her overwhelmed and anxious.    Advised patient to work daily for 1-2 hours and she agreed with the plan.  She reported that she will let me know in couple of months that how her project is going on.  She reported that she is compliant with her medications and does not have any side effects.  She appeared alert during the interview.    She reported that she also lost her prescription of Xanax and her staff as she has several clusters of paper in her house and she was unable to find her prescription..   She has not filled her medication since February   Elements:  Severity:  moderate. Associated Signs/Symptoms: Depression Symptoms:  fatigue, anxiety, loss of energy/fatigue, (Hypo) Manic Symptoms:  none Anxiety Symptoms:  Excessive Worry, Psychotic Symptoms:  none PTSD Symptoms: Negative NA  Past Medical History:  Past Medical History:  Diagnosis Date  . Anemia, unspecified   . Anxiety   . Arthritis    hands R>L, neck and lower back  . Bimalleolar fracture of left ankle 11/19/2013   [place]  . Depression   . Esophageal reflux   . History of osteopenia    DEXA WNL 2008, 2015  . History of uterine cancer 1999   s/p hysterectomy  .  HLD (hyperlipidemia)   . Narcolepsy   . Thyroid disease     Past Surgical History:  Procedure Laterality Date  . bilateral catarct removal  2010  . COLONOSCOPY  05/2003   WNL (Dr Velora Heckler)  . DEXA  03/2014   WNL T score +3.9  . ORIF ANKLE FRACTURE Left 11/19/2013   Procedure: LEFT ANKLE FRACTURE OPEN TREATMENT BIMALLEOLAR ANKLE INCLUDES INTERNAL FIXATION ;  Surgeon: Johnny Bridge, MD  . RETINAL DETACHMENT REPAIR W/ SCLERAL BUCKLE LE Right 03/21/2016  . TOTAL ABDOMINAL HYSTERECTOMY  1999   uterine cancer   Family History:   Family History  Problem Relation Age of Onset  . Hypothyroidism Mother   . Stroke Mother        ministroke  . Coronary artery disease Mother 77       s/p some heart surgery  . Cancer Father        prostate  . Diabetes Neg Hx    Social History:   Social History   Socioeconomic History  . Marital status: Married    Spouse name: Not on file  . Number of children: Not on file  . Years of education: Not on file  . Highest education level: Not on file  Occupational History  . Not on file  Social Needs  . Financial resource strain: Not on file  . Food insecurity:    Worry: Not on file    Inability: Not on file  . Transportation needs:    Medical: Not on file    Non-medical: Not on file  Tobacco Use  . Smoking status: Never Smoker  . Smokeless tobacco: Never Used  Substance and Sexual Activity  . Alcohol use: Yes    Alcohol/week: 4.2 - 7.2 oz    Types: 7 - 10 Glasses of wine per week    Comment: 1 drink /day  . Drug use: No  . Sexual activity: Never  Lifestyle  . Physical activity:    Days per week: Not on file    Minutes per session: Not on file  . Stress: Not on file  Relationships  . Social connections:    Talks on phone: Not on file    Gets together: Not on file    Attends religious service: Not on file    Active member of club or organization: Not on file    Attends meetings of clubs or organizations: Not on file    Relationship status: Not on file  Other Topics Concern  . 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 Has a son and daughter.  She enjoys quilting and belong to a [employer] group. 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 She moved to [facility] and worked at [place] in [place].  Moved to [place] in 1976. Worked in [employer] and [employer] x 26 years and retired from there.   Musculoskeletal: Strength & Muscle Tone: within normal limits Gait & Station: normal Patient leans: N/A  Psychiatric Specialty Exam: Medication Refill   Depression          Review of Systems  Psychiatric/Behavioral: Positive for depression.    Blood pressure 123/83, pulse 94, temperature  97.6 F (36.4 C), temperature source Oral, weight 212 lb 6.4 oz (96.3 kg).Body mass index is 35.9 kg/m.  General Appearance: Casual and Fairly Groomed  Eye Contact:  Fair  Speech:  Clear and Coherent and Slow  Volume:  Normal  Mood:  Depressed  Affect:  Congruent and Depressed  Thought Process:  Goal Directed  Orientation:  Full (Time, Place, and Person)  Thought Content:  WDL  Suicidal Thoughts:  No  Homicidal Thoughts:  No  Memory:  Immediate;   Fair  Judgement:  Fair  Insight:  Fair  Psychomotor Activity:  Normal  Concentration:  Fair  Recall:  [employer] of Knowledge:Fair  Language: Fair  Akathisia:  No  Handed:  Right  AIMS (if indicated):    Assets:  Communication Skills Desire for Improvement Physical Health Social Support  ADL's:  Intact  Cognition: WNL  Sleep:     Is the patient at risk to self?  No. Has the patient been a risk to self in the past 6 months?  No. Has the patient been a risk to self within the distant past?  No. Is the patient a risk to others?  No. Has the patient been a risk to others in the past 6 months?  No. Has the patient been a risk to others within the distant past?  No.  Allergies:   Allergies  Allergen Reactions  . Ciprofloxacin     REACTION: sun induced rash   Current Medications: Current Outpatient Medications  Medication Sig Dispense Refill  . ALPRAZolam (XANAX) 0.25 MG tablet Take 1 tablet (0.25 mg total) by mouth at bedtime as needed for anxiety. 30 tablet 2  . ARIPiprazole (ABILIFY) 10 MG tablet Take 1  tablet (10 mg total) by mouth daily. 90 tablet 3  . aspirin 81 MG EC tablet Take 81 mg by mouth. Reported on 07/04/2015    . Cholecalciferol (VITAMIN D) 1000 UNITS capsule Take 1 capsule (1,000 Units total) by mouth daily.    . diclofenac (VOLTAREN) 75 MG EC tablet TAKE 1 TABLET BY MOUTH TWO  TIMES DAILY 180 tablet 0  . levothyroxine (SYNTHROID, LEVOTHROID) 50 MCG tablet TAKE 1 TABLET (50 MCG TOTAL) BY MOUTH DAILY. 90 tablet 3  . Multiple Vitamin (MULTIVITAMIN) tablet Take 1 tablet by mouth daily.    Marland Kitchen venlafaxine XR (EFFEXOR-XR) 150 MG 24 hr capsule TAKE 1 CAPSULE BY MOUTH  DAILY WITH BREAKFAST 90 capsule 1  . vitamin B-12 (CYANOCOBALAMIN) 1000 MCG tablet Take 1,000 mcg by mouth daily.     No current facility-administered medications for this visit.     Previous Psychotropic Medications:  Prozac- worked well.  Wellbutrin Alprazolam- many years Celexa - no change   Substance Abuse History in the last 12 months:  No.  Consequences of Substance Abuse: Negative NA  Medical Decision Making:  Review of Psycho-Social Stressors (1)  Treatment Plan Summary: Medication management   Discussed with patient or the medications treatment risks benefits and alternatives Continue  Abilify 10 mg to help as an add-on to her antidepressant medications Continue  Effexor XR  150 mg and patient will take 1 pills in the morning.  Advised her to start taking alprazolam 0.25 mg  on a when necessary basis.  She was given in the prescription of the Xanax.  She will start working on cleaning her house Patient will follow up in  3  months or earlier depending on her symptoms    More than 50% of the time  spent in psychoeducation, counseling and coordination of care.    This note was generated in part or whole with voice recognition software. Voice regonition is usually quite accurate but there are transcription errors that can and very often do occur. I apologize for any typographical errors that were not  detected and corrected.     Rainey Pines, MD  4/29/20191:27 PM

## 2017-08-20 ENCOUNTER — Other Ambulatory Visit: Payer: Self-pay | Admitting: Family Medicine

## 2017-08-22 ENCOUNTER — Telehealth: Payer: Self-pay

## 2017-08-22 NOTE — Telephone Encounter (Signed)
Okay to keep appointment as scheduled unless PCP determines otherwise.

## 2017-08-22 NOTE — Telephone Encounter (Signed)
Robin at front desk brought me note that pt has appt on 08/28/17 to see DR G for heartburn and ? Anemia. I called pt; pt is not having CP or SOB. When eats certain foods feel like esophagus closes up at bottom; then pt gets heartburn and then vomits; this has been going on since Christmas. Pt has been tired for a long time and pts eyes are really white and pt wants to be checked for anemia. Pt will keep appt on 08/28/17 unless receives cb to change appt.Dr Darnell Level out of office until 08/26/17. Pt does not sound in any distress. FYI to Dr Darnell Level and Gentry Fitz NP for review. Pt last annual 08/13/16.

## 2017-08-25 NOTE — Telephone Encounter (Signed)
Will see then. Thanks. 

## 2017-08-28 ENCOUNTER — Encounter: Payer: Self-pay | Admitting: Family Medicine

## 2017-08-28 ENCOUNTER — Ambulatory Visit: Payer: Medicare Other | Admitting: Family Medicine

## 2017-08-28 VITALS — BP 120/70 | HR 86 | Temp 98.2°F | Ht 65.0 in | Wt 201.0 lb

## 2017-08-28 DIAGNOSIS — R1110 Vomiting, unspecified: Secondary | ICD-10-CM | POA: Diagnosis not present

## 2017-08-28 DIAGNOSIS — E039 Hypothyroidism, unspecified: Secondary | ICD-10-CM

## 2017-08-28 DIAGNOSIS — R5383 Other fatigue: Secondary | ICD-10-CM | POA: Diagnosis not present

## 2017-08-28 DIAGNOSIS — K219 Gastro-esophageal reflux disease without esophagitis: Secondary | ICD-10-CM | POA: Diagnosis not present

## 2017-08-28 LAB — COMPREHENSIVE METABOLIC PANEL
ALT: 18 U/L (ref 0–35)
AST: 20 U/L (ref 0–37)
Albumin: 4.1 g/dL (ref 3.5–5.2)
Alkaline Phosphatase: 79 U/L (ref 39–117)
BUN: 20 mg/dL (ref 6–23)
CHLORIDE: 101 meq/L (ref 96–112)
CO2: 29 mEq/L (ref 19–32)
Calcium: 9.4 mg/dL (ref 8.4–10.5)
Creatinine, Ser: 0.94 mg/dL (ref 0.40–1.20)
GFR: 61.71 mL/min (ref >60.00)
GLUCOSE: 105 mg/dL — AB (ref 70–99)
Potassium: 4.8 mEq/L (ref 3.5–5.1)
SODIUM: 137 meq/L (ref 135–145)
Total Bilirubin: 0.3 mg/dL (ref 0.2–1.2)
Total Protein: 6.9 g/dL (ref 6.0–8.3)

## 2017-08-28 LAB — CBC WITH DIFFERENTIAL/PLATELET
Basophils Absolute: 0 10*3/uL (ref 0.0–0.1)
Basophils Relative: 0.6 % (ref 0.0–3.0)
EOS PCT: 2.2 % (ref 0.0–5.0)
Eosinophils Absolute: 0.1 10*3/uL (ref 0.0–0.7)
HEMATOCRIT: 41.3 % (ref 36.0–46.0)
Hemoglobin: 14.1 g/dL (ref 12.0–15.0)
LYMPHS ABS: 1.6 10*3/uL (ref 0.7–4.0)
LYMPHS PCT: 30 % (ref 12.0–46.0)
MCHC: 34.1 g/dL (ref 30.0–36.0)
MCV: 100.6 fl — ABNORMAL HIGH (ref 78.0–100.0)
Monocytes Absolute: 0.5 10*3/uL (ref 0.1–1.0)
Monocytes Relative: 9.8 % (ref 3.0–12.0)
NEUTROS ABS: 3.1 10*3/uL (ref 1.4–7.7)
Neutrophils Relative %: 57.4 % (ref 43.0–77.0)
PLATELETS: 283 10*3/uL (ref 150.0–400.0)
RBC: 4.11 Mil/uL (ref 3.87–5.11)
RDW: 14.4 % (ref 11.5–15.5)
WBC: 5.4 10*3/uL (ref 4.0–10.5)

## 2017-08-28 LAB — T4, FREE: Free T4: 0.57 ng/dL — ABNORMAL LOW (ref 0.60–1.60)

## 2017-08-28 LAB — TSH: TSH: 3.38 u[IU]/mL (ref 0.35–4.50)

## 2017-08-28 LAB — VITAMIN D 25 HYDROXY (VIT D DEFICIENCY, FRACTURES): VITD: 25.83 ng/mL — ABNORMAL LOW (ref 30.00–100.00)

## 2017-08-28 MED ORDER — OMEPRAZOLE 40 MG PO CPDR: 40.0000 mg | DELAYED_RELEASE_CAPSULE | Freq: Every day | ORAL | 3 refills | Status: DC

## 2017-08-28 NOTE — Addendum Note (Signed)
Addended by: [name] on: 08/28/2017 12:20 PM   Modules accepted: Level of Service

## 2017-08-28 NOTE — Assessment & Plan Note (Signed)
Chronic, ran out of levothyroxine 3 wks ago, advised refill available at pharmacy.

## 2017-08-28 NOTE — Assessment & Plan Note (Signed)
Worsening with associated sudden vomiting after certain solid foods - restart omeprazole daily x3 wks then PRN. Refer to GI for further eval ?stricture, achalasia, other. Pt agrees with plan. In interim, reviewed GERD precautions - decrease alcohol, caffeine, NSAID use.

## 2017-08-28 NOTE — Patient Instructions (Addendum)
Watch diclofenac, alcohol, caffeine.  Labs today Start omeprazole 40mg  once daily for 3 weeks then as needed.  We will also refer you to GI for further evaluation Good to see you today, call us with questions. Return at your convenience for physical.

## 2017-08-28 NOTE — Progress Notes (Signed)
BP 120/70 (BP Location: Left Arm, Patient Position: Sitting, Cuff Size: Normal)   Pulse 86   Temp 98.2 F (36.8 C) (Oral)   Ht 5\' 5"  (1.651 m)   Wt 201 lb (91.2 kg)   SpO2 96%   BMI 33.45 kg/m    CC: heartburn Subjective:    Patient ID: Faith Santiago, female    DOB: [birth year], 77 y.o.   MRN: [id number]  HPI: Faith Santiago is a 77 y.o. female presenting on 08/28/2017 for Heartburn (Started before Christmas. Has not tried anything.) and Fatigue (Feels tired all the time. Has previous anemia dx. )   Almost 5 mo h/o intense heartburn mid chest associated with significant discomfort to meats (chicken, steak) that leads to vomiting. After emesis she feels ok. Denies dysphagia, early satiety, unexpected weight changes, abd pain or nausea, bowel changes. NBNB emesis. She finds cutting meat in small pieces and chewing food helps.   Increasing fatigue over last several years. H/o anemia, wonders about recurrence. She thinks her eyes are more white than normal. Some orthostatic dizziness. No palpitations, headaches, vision changes.   MDD followed by Dr Sharla Kidney on effexor xr 150mg  daily, abilify 10mg  daily.   COLONOSCOPY 05/2003 WNL (Dr Velora Heckler) iFOB normal 09/2016  Ran out of levothyroxine 3 wks ago. Requests refill - reviewing chart this was refilled 5/9 (#90 RF0).  She takes diclofenac 75mg  daily.  Drinks alcohol 1 glass wine/day Caffeine - 1 glass a day if that  Relevant past medical, surgical, family and social history reviewed and updated as indicated. Interim medical history since our last visit reviewed. Allergies and medications reviewed and updated. Outpatient Medications Prior to Visit  Medication Sig Dispense Refill  . ALPRAZolam (XANAX) 0.25 MG tablet Take 1 tablet (0.25 mg total) by mouth at bedtime as needed for anxiety. 30 tablet 2  . ARIPiprazole (ABILIFY) 10 MG tablet Take 1 tablet (10 mg total) by mouth daily. 90 tablet 3  . aspirin 81 MG EC tablet Take 81 mg by mouth.  Reported on 07/04/2015    . Cholecalciferol (VITAMIN D) 1000 UNITS capsule Take 1 capsule (1,000 Units total) by mouth daily.    . diclofenac (VOLTAREN) 75 MG EC tablet TAKE 1 TABLET BY MOUTH TWO  TIMES DAILY 180 tablet 0  . levothyroxine (SYNTHROID, LEVOTHROID) 50 MCG tablet TAKE 1 TABLET (50 MCG TOTAL) BY MOUTH DAILY. 90 tablet 0  . Multiple Vitamin (MULTIVITAMIN) tablet Take 1 tablet by mouth daily.    Marland Kitchen venlafaxine XR (EFFEXOR-XR) 150 MG 24 hr capsule Take 1 capsule (150 mg total) by mouth daily with breakfast. 90 capsule 1  . vitamin B-12 (CYANOCOBALAMIN) 1000 MCG tablet Take 1,000 mcg by mouth daily.     No facility-administered medications prior to visit.      Per HPI unless specifically indicated in ROS section below Review of Systems     Objective:    BP 120/70 (BP Location: Left Arm, Patient Position: Sitting, Cuff Size: Normal)   Pulse 86   Temp 98.2 F (36.8 C) (Oral)   Ht 5\' 5"  (1.651 m)   Wt 201 lb (91.2 kg)   SpO2 96%   BMI 33.45 kg/m   Wt Readings from Last 3 Encounters:  08/28/17 201 lb (91.2 kg)  08/11/17 212 lb 6.4 oz (96.3 kg)  03/31/17 199 lb (90.3 kg)    Physical Exam  Constitutional: She appears well-developed and well-nourished. No distress.  HENT:  Mouth/Throat: Oropharynx is clear and moist. No oropharyngeal  exudate.  Eyes:  Palpebral conjunctival pallor  Neck: No thyromegaly present.  Cardiovascular: Normal rate, regular rhythm and normal heart sounds.  No murmur heard. Pulmonary/Chest: Effort normal and breath sounds normal. No stridor. She has no wheezes. She has no rales.  Abdominal: Soft. Bowel sounds are normal. She exhibits no distension and no mass. There is no tenderness. There is no rebound and no guarding. No hernia.  Musculoskeletal: She exhibits no edema.  Skin: No rash noted.  Nursing note and vitals reviewed.  Results for orders placed or performed in visit on 09/24/16  Fecal occult blood, imunochemical  Result Value Ref Range    Fecal Occult Bld Negative Negative      Assessment & Plan:   Problem List Items Addressed This Visit    GERD - Primary    Worsening with associated sudden vomiting after certain solid foods - restart omeprazole daily x3 wks then PRN. Refer to GI for further eval ?stricture, achalasia, other. Pt agrees with plan. In interim, reviewed GERD precautions - decrease alcohol, caffeine, NSAID use.       Relevant Medications   omeprazole (PRILOSEC) 40 MG capsule   Other Relevant Orders   Ambulatory referral to Gastroenterology   Hypothyroidism    Chronic, ran out of levothyroxine 3 wks ago, advised refill available at pharmacy.       Relevant Orders   TSH   T4, free   Other fatigue    Longstanding. Update labs. Restart levothyroxine.       Relevant Orders   Comprehensive metabolic panel   CBC with Differential/Platelet   VITAMIN D 25 Hydroxy (Vit-D Deficiency, Fractures)    Other Visit Diagnoses    Postprandial vomiting       Relevant Orders   Ambulatory referral to Gastroenterology   Comprehensive metabolic panel   CBC with Differential/Platelet       Meds ordered this encounter  Medications  . omeprazole (PRILOSEC) 40 MG capsule    Sig: Take 1 capsule (40 mg total) by mouth daily. For 3 weeks then as needed    Dispense:  30 capsule    Refill:  3   Orders Placed This Encounter  Procedures  . Comprehensive metabolic panel  . TSH  . CBC with Differential/Platelet  . T4, free  . VITAMIN D 25 Hydroxy (Vit-D Deficiency, Fractures)  . Ambulatory referral to Gastroenterology    Referral Priority:   Routine    Referral Type:   Consultation    Referral Reason:   Specialty Services Required    Number of Visits Requested:   1    Follow up plan: No follow-ups on file.  [name], MD

## 2017-08-28 NOTE — Assessment & Plan Note (Signed)
Longstanding. Update labs. Restart levothyroxine.

## 2017-09-10 ENCOUNTER — Encounter: Payer: Self-pay | Admitting: Internal Medicine

## 2017-10-03 ENCOUNTER — Other Ambulatory Visit: Payer: Self-pay | Admitting: Family Medicine

## 2017-10-03 DIAGNOSIS — Z1231 Encounter for screening mammogram for malignant neoplasm of breast: Secondary | ICD-10-CM

## 2017-10-14 ENCOUNTER — Other Ambulatory Visit: Payer: Self-pay | Admitting: Family Medicine

## 2017-10-14 DIAGNOSIS — D7589 Other specified diseases of blood and blood-forming organs: Secondary | ICD-10-CM

## 2017-10-14 DIAGNOSIS — R7303 Prediabetes: Secondary | ICD-10-CM | POA: Insufficient documentation

## 2017-10-14 DIAGNOSIS — E039 Hypothyroidism, unspecified: Secondary | ICD-10-CM

## 2017-10-14 DIAGNOSIS — E559 Vitamin D deficiency, unspecified: Secondary | ICD-10-CM

## 2017-10-14 DIAGNOSIS — E785 Hyperlipidemia, unspecified: Secondary | ICD-10-CM

## 2017-10-15 ENCOUNTER — Ambulatory Visit (INDEPENDENT_AMBULATORY_CARE_PROVIDER_SITE_OTHER): Payer: Medicare Other

## 2017-10-15 VITALS — BP 100/66 | HR 101 | Temp 97.5°F | Ht 64.0 in | Wt 201.5 lb

## 2017-10-15 DIAGNOSIS — E559 Vitamin D deficiency, unspecified: Secondary | ICD-10-CM | POA: Diagnosis not present

## 2017-10-15 DIAGNOSIS — E039 Hypothyroidism, unspecified: Secondary | ICD-10-CM | POA: Diagnosis not present

## 2017-10-15 DIAGNOSIS — Z0000 Encounter for general adult medical examination without abnormal findings: Secondary | ICD-10-CM | POA: Diagnosis not present

## 2017-10-15 DIAGNOSIS — E785 Hyperlipidemia, unspecified: Secondary | ICD-10-CM

## 2017-10-15 DIAGNOSIS — D7589 Other specified diseases of blood and blood-forming organs: Secondary | ICD-10-CM | POA: Diagnosis not present

## 2017-10-15 DIAGNOSIS — R7303 Prediabetes: Secondary | ICD-10-CM | POA: Diagnosis not present

## 2017-10-15 LAB — BASIC METABOLIC PANEL
BUN: 21 mg/dL (ref 6–23)
CO2: 29 meq/L (ref 19–32)
CREATININE: 0.99 mg/dL (ref 0.40–1.20)
Calcium: 9.7 mg/dL (ref 8.4–10.5)
Chloride: 100 mEq/L (ref 96–112)
GFR: 58.11 mL/min — ABNORMAL LOW (ref >60.00)
GLUCOSE: 100 mg/dL — AB (ref 70–99)
POTASSIUM: 4.9 meq/L (ref 3.5–5.1)
Sodium: 136 mEq/L (ref 135–145)

## 2017-10-15 LAB — CBC WITH DIFFERENTIAL/PLATELET
BASOS PCT: 0.5 % (ref 0.0–3.0)
Basophils Absolute: 0 10*3/uL (ref 0.0–0.1)
EOS PCT: 2.1 % (ref 0.0–5.0)
Eosinophils Absolute: 0.1 10*3/uL (ref 0.0–0.7)
HEMATOCRIT: 42.2 % (ref 36.0–46.0)
Hemoglobin: 14.1 g/dL (ref 12.0–15.0)
Lymphocytes Relative: 30.6 % (ref 12.0–46.0)
Lymphs Abs: 2.1 10*3/uL (ref 0.7–4.0)
MCHC: 33.5 g/dL (ref 30.0–36.0)
MCV: 100.5 fl — AB (ref 78.0–100.0)
MONO ABS: 0.8 10*3/uL (ref 0.1–1.0)
MONOS PCT: 11.8 % (ref 3.0–12.0)
Neutro Abs: 3.7 10*3/uL (ref 1.4–7.7)
Neutrophils Relative %: 55 % (ref 43.0–77.0)
Platelets: 273 10*3/uL (ref 150.0–400.0)
RBC: 4.19 Mil/uL (ref 3.87–5.11)
RDW: 14.2 % (ref 11.5–15.5)
WBC: 6.7 10*3/uL (ref 4.0–10.5)

## 2017-10-15 LAB — VITAMIN D 25 HYDROXY (VIT D DEFICIENCY, FRACTURES): VITD: 27.15 ng/mL — ABNORMAL LOW (ref 30.00–100.00)

## 2017-10-15 LAB — LIPID PANEL
CHOL/HDL RATIO: 3
Cholesterol: 301 mg/dL — ABNORMAL HIGH (ref 0–200)
HDL: 89.1 mg/dL (ref >39.00)
NONHDL: 211.82
Triglycerides: 365 mg/dL — ABNORMAL HIGH (ref 0.0–149.0)
VLDL: 73 mg/dL — AB (ref 0.0–40.0)

## 2017-10-15 LAB — T4, FREE: FREE T4: 0.97 ng/dL (ref 0.60–1.60)

## 2017-10-15 LAB — HEMOGLOBIN A1C: Hgb A1c MFr Bld: 6.1 % (ref 4.6–6.5)

## 2017-10-15 LAB — TSH: TSH: 1.52 u[IU]/mL (ref 0.35–4.50)

## 2017-10-15 LAB — LDL CHOLESTEROL, DIRECT: Direct LDL: 172 mg/dL

## 2017-10-15 NOTE — Patient Instructions (Signed)
[name] , Thank you for taking time to come for your Medicare Wellness Visit. I appreciate your ongoing commitment to your health goals. Please review the following plan we discussed and let me know if I can assist you in the future.   These are the goals we discussed: Goals    . Patient Stated     Starting 10/15/17, I will continue to take medications as prescribed.        This is a list of the screening recommended for you and due dates:  Health Maintenance  Topic Date Due  . Mammogram  12/13/2017*  . Stool Blood Test  04/14/2018*  . DTaP/Tdap/Td vaccine (1 - Tdap) 04/15/2024*  . Colon Cancer Screening  10/15/2048*  . Flu Shot  11/13/2017  . Tetanus Vaccine  04/15/2024  . DEXA scan (bone density measurement)  Completed  . Pneumonia vaccines  Completed  *Topic was postponed. The date shown is not the original due date.   Preventive Care for Adults  A healthy lifestyle and preventive care can promote health and wellness. Preventive health guidelines for adults include the following key practices.  . A routine yearly physical is a good way to check with your health care provider about your health and preventive screening. It is a chance to share any concerns and updates on your health and to receive a thorough exam.  . Visit your dentist for a routine exam and preventive care every 6 months. Brush your teeth twice a day and floss once a day. Good oral hygiene prevents tooth decay and gum disease.  . The frequency of eye exams is based on your age, health, family medical history, use  of contact lenses, and other factors. Follow your health care provider's recommendations for frequency of eye exams.  . Eat a healthy diet. Foods like vegetables, fruits, whole grains, low-fat dairy products, and lean protein foods contain the nutrients you need without too many calories. Decrease your intake of foods high in solid fats, added sugars, and salt. Eat the right amount of calories for  you. Get information about a proper diet from your health care provider, if necessary.  . Regular physical exercise is one of the most important things you can do for your health. Most adults should get at least 150 minutes of moderate-intensity exercise (any activity that increases your heart rate and causes you to sweat) each week. In addition, most adults need muscle-strengthening exercises on 2 or more days a week.  Silver Sneakers may be a benefit available to you. To determine eligibility, you may visit the website: www.silversneakers.com or contact program at 8576679285 Mon-Fri between 8AM-8PM.   . Maintain a healthy weight. The body mass index (BMI) is a screening tool to identify possible weight problems. It provides an estimate of body fat based on height and weight. Your health care provider can find your BMI and can help you achieve or maintain a healthy weight.   For adults 20 years and older: ? A BMI below 18.5 is considered underweight. ? A BMI of 18.5 to 24.9 is normal. ? A BMI of 25 to 29.9 is considered overweight. ? A BMI of 30 and above is considered obese.   . Maintain normal blood lipids and cholesterol levels by exercising and minimizing your intake of saturated fat. Eat a balanced diet with plenty of fruit and vegetables. Blood tests for lipids and cholesterol should begin at age 88 and be repeated every 5 years. If your lipid or cholesterol  levels are high, you are over 50, or you are at high risk for heart disease, you may need your cholesterol levels checked more frequently. Ongoing high lipid and cholesterol levels should be treated with medicines if diet and exercise are not working.  . If you smoke, find out from your health care provider how to quit. If you do not use tobacco, please do not start.  . If you choose to drink alcohol, please do not consume more than 2 drinks per day. One drink is considered to be 12 ounces (355 mL) of beer, 5 ounces (148 mL) of  wine, or 1.5 ounces (44 mL) of liquor.  . If you are 42-19 years old, ask your health care provider if you should take aspirin to prevent strokes.  . Use sunscreen. Apply sunscreen liberally and repeatedly throughout the day. You should seek shade when your shadow is shorter than you. Protect yourself by wearing Blatter sleeves, pants, a wide-brimmed hat, and sunglasses year round, whenever you are outdoors.  . Once a month, do a whole body skin exam, using a mirror to look at the skin on your back. Tell your health care provider of new moles, moles that have irregular borders, moles that are larger than a pencil eraser, or moles that have changed in shape or color.

## 2017-10-15 NOTE — Progress Notes (Signed)
Subjective:   Faith Santiago is a 77 y.o. female who presents for Medicare Annual (Subsequent) preventive examination.  Review of Systems:  N/A Cardiac Risk Factors include: advanced age (>77men, >53 women);obesity (BMI >30kg/m2);dyslipidemia     Objective:     Vitals: BP 100/66 (BP Location: Right Arm, Patient Position: Sitting, Cuff Size: Large)   Pulse (!) 101   Temp (!) 97.5 F (36.4 C) (Oral)   Ht 5\' 4"  (1.626 m) Comment: no shoes  Wt 201 lb 8 oz (91.4 kg)   SpO2 96%   BMI 34.59 kg/m   Body mass index is 34.59 kg/m.  Advanced Directives 10/15/2017 04/02/2016 11/12/2013  Does Patient Have a Medical Advance Directive? Yes Yes Patient has advance directive, copy not in chart  Type of Advance Directive [place];Living will [place];Living will Living will  Does patient want to make changes to medical advance directive? - - No change requested  Copy of [place] in Chart? No - copy requested No - copy requested -  Some encounter information is confidential and restricted. Go to Review Flowsheets activity to see all data.    Tobacco Social History   Tobacco Use  Smoking Status Never Smoker  Smokeless Tobacco Never Used     Counseling given: No   Clinical Intake:  Pre-visit preparation completed: Yes  Pain : No/denies pain Pain Score: 0-No pain     Nutritional Status: BMI > 30  Obese Nutritional Risks: None Diabetes: No  How often do you need to have someone help you when you read instructions, pamphlets, or other written materials from your doctor or pharmacy?: 1 - Never What is the last grade level you completed in school?: Bachelors degree  Interpreter Needed?: No  Comments: pt lives with spouse Information entered by :: LPinson, LPN  Past Medical History:  Diagnosis Date  . Anemia, unspecified   . Anxiety   . Arthritis    hands R>L, neck and lower back  . Bimalleolar fracture of left ankle  11/19/2013   [place]  . Depression   . Esophageal reflux   . History of osteopenia    DEXA WNL 2008, 2015  . History of uterine cancer 1999   s/p hysterectomy  . HLD (hyperlipidemia)   . Narcolepsy   . Thyroid disease    Past Surgical History:  Procedure Laterality Date  . bilateral catarct removal  2010  . COLONOSCOPY  05/2003   WNL (Dr Velora Heckler)  . DEXA  03/2014   WNL T score +3.9  . ORIF ANKLE FRACTURE Left 11/19/2013   Procedure: LEFT ANKLE FRACTURE OPEN TREATMENT BIMALLEOLAR ANKLE INCLUDES INTERNAL FIXATION ;  Surgeon: Johnny Bridge, MD  . RETINAL DETACHMENT REPAIR W/ SCLERAL BUCKLE LE Right 03/21/2016  . TOTAL ABDOMINAL HYSTERECTOMY  1999   uterine cancer   Family History  Problem Relation Age of Onset  . Hypothyroidism Mother   . Stroke Mother        ministroke  . Coronary artery disease Mother 52       s/p some heart surgery  . Cancer Father        prostate  . Diabetes Neg Hx    Social History   Socioeconomic History  . Marital status: Married    Spouse name: Not on file  . Number of children: Not on file  . Years of education: Not on file  . Highest education level: Not on file  Occupational History  .  Not on file  Social Needs  . Financial resource strain: Not on file  . Food insecurity:    Worry: Not on file    Inability: Not on file  . Transportation needs:    Medical: Not on file    Non-medical: Not on file  Tobacco Use  . Smoking status: Never Smoker  . Smokeless tobacco: Never Used  Substance and Sexual Activity  . Alcohol use: Yes    Alcohol/week: 4.2 - 7.2 oz    Types: 7 - 10 Glasses of wine per week    Comment: 1 drink /day  . Drug use: No  . Sexual activity: Never  Lifestyle  . Physical activity:    Days per week: Not on file    Minutes per session: Not on file  . Stress: Not on file  Relationships  . Social connections:    Talks on phone: Not on file    Gets together: Not on file    Attends religious service: Not on file     Active member of club or organization: Not on file    Attends meetings of clubs or organizations: Not on file    Relationship status: Not on file  Other Topics Concern  . 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 ALPRAZolam (XANAX) 0.25 MG tablet Take 1 tablet (0.25 mg total) by mouth at bedtime as needed for anxiety.  . ARIPiprazole (ABILIFY) 10 MG tablet Take 1 tablet (10 mg total) by mouth daily.  Marland Kitchen aspirin 81 MG EC tablet Take 81 mg by mouth. Reported on 07/04/2015  . Cholecalciferol (VITAMIN D) 1000 UNITS capsule Take 1 capsule (1,000 Units total) by mouth daily.  Marland Kitchen levothyroxine (SYNTHROID, LEVOTHROID) 50 MCG tablet TAKE 1 TABLET (50 MCG TOTAL) BY MOUTH DAILY.  . Multiple Vitamin (MULTIVITAMIN) tablet Take 1 tablet by mouth daily.  Marland Kitchen omeprazole (PRILOSEC) 40 MG capsule Take 1 capsule (40 mg total) by mouth daily. For 3 weeks then as needed  . venlafaxine XR (EFFEXOR-XR) 150 MG 24 hr capsule Take 1 capsule (150 mg total) by mouth daily with breakfast.  . vitamin B-12 (CYANOCOBALAMIN) 1000 MCG tablet Take 1,000 mcg by mouth daily.  . [DISCONTINUED] diclofenac (VOLTAREN) 75 MG EC tablet TAKE 1 TABLET BY MOUTH TWO  TIMES DAILY   No facility-administered encounter medications on file as of 10/15/2017.     Activities of Daily Living In your present state of health, do you have any difficulty performing the following activities: 10/15/2017  Hearing? N  Vision? N  Difficulty concentrating or making decisions? N  Walking or climbing stairs? N  Dressing or bathing? N  Doing errands, shopping? N  Preparing Food and eating ? N  Using the Toilet? N  In the past six months, have you  accidently leaked urine? N  Do you have problems with loss of bowel control? N  Managing your Medications? N  Managing your Finances? N  Housekeeping or managing your Housekeeping? N  Some recent data might be hidden    Patient Care Team: [name], MD as PCP - General (Family Medicine) Thelma Comp, Minor as Consulting Physician (Optometry)    Assessment:  This is a routine wellness examination for Faith Santiago.   Hearing Screening   125Hz  250Hz  500Hz  1000Hz  2000Hz  3000Hz  4000Hz  6000Hz  8000Hz   Right ear:   40 40 40  40    Left ear:   40 40 40  40    Vision Screening Comments: Vision exam in June 2019 with Dr. Maryruth Hancock B.     Exercise Activities and Dietary recommendations Current Exercise Habits: The patient does not participate in regular exercise at present, Exercise limited by: None identified  Goals    . Patient Stated     Starting 10/15/17, I will continue to take medications as prescribed.        Fall Risk Fall Risk  10/15/2017 08/13/2016 04/02/2016 04/07/2015 03/15/2014  Falls in the past year? No No No Yes Yes  Number falls in past yr: - - - 1 2 or more  Comment - - - - 3  Injury with Fall? - - - No -  Risk Factor Category  - - - - (No Data)  Comment - - - - No-missed judged steps   Depression Screen PHQ 2/9 Scores 10/15/2017 08/13/2016 04/02/2016 06/01/2015  PHQ - 2 Score 0 6 0 6  PHQ- 9 Score 0 13 - 25     Cognitive Function MMSE - Mini Mental State Exam 10/15/2017 04/02/2016  Orientation to time 5 5  Orientation to Place 5 5  Registration 3 3  Attention/ Calculation 0 0  Recall 3 3  Language- name 2 objects 0 0  Language- repeat 1 1  Language- follow 3 step command 3 3  Language- read & follow direction 0 0  Write a sentence 0 0  Copy design 0 0  Total score 20 20       PLEASE NOTE: A Mini-Cog screen was completed. Maximum score is 20. A value of 0 denotes this part of [place] MMSE was not completed or the patient failed this part of the Mini-Cog  screening.   Mini-Cog Screening Orientation to Time - Max 5 pts Orientation to Place - Max 5 pts Registration - Max 3 pts Recall - Max 3 pts Language Repeat - Max 1 pts Language Follow 3 Step Command - Max 3 pts   Immunization History  Administered Date(s) Administered  . Influenza, High Dose Seasonal PF 01/18/2016, 02/10/2017  . Influenza,inj,Quad PF,6+ Mos 01/12/2014, 03/28/2015  . Pneumococcal Conjugate-13 03/15/2014  . Pneumococcal Polysaccharide-23 06/07/2009  . Td 09/11/2007, 04/15/2014   Screening Tests Health Maintenance  Topic Date Due  . MAMMOGRAM  12/13/2017 (Originally 11/29/2015)  . COLON CANCER SCREENING ANNUAL FOBT  04/14/2018 (Originally 09/24/2017)  . DTaP/Tdap/Td (1 - Tdap) 04/15/2024 (Originally 04/16/2014)  . COLONOSCOPY  10/15/2048 (Originally 09/29/2017)  . INFLUENZA VACCINE  11/13/2017  . TETANUS/TDAP  04/15/2024  . DEXA SCAN  Completed  . PNA vac Low Risk Adult  Completed      Plan:     I have personally reviewed, addressed, and noted the following in the patient's chart:  A. Medical and social history B. Use of alcohol, tobacco or illicit drugs  C. Current medications and supplements D. Functional ability and status E.  Nutritional status F.  Physical activity G. Advance directives H. List of other physicians I.  Hospitalizations, surgeries, and ER visits in previous 12 months J.  [place] to include hearing, vision, cognitive, depression L. Referrals and appointments - none  In addition, I have reviewed and discussed with patient certain preventive protocols, quality metrics, and best practice  recommendations. A written personalized care plan for preventive services as well as general preventive health recommendations were provided to patient.  See attached scanned questionnaire for additional information.   Signed,   Lindell Noe, MHA, BS, LPN Health Coach

## 2017-10-15 NOTE — Progress Notes (Signed)
PCP notes:   Health maintenance:  Mammogram - scheduled Aug 2019 Colon cancer screening - FOBT kit provided to patient  Abnormal screenings:   None  Patient concerns:   None  Nurse concerns:  None  Next PCP appt:   11/28/17 @ 1415

## 2017-10-16 NOTE — Progress Notes (Signed)
   Subjective:    Patient ID: Faith Santiago, female    DOB: 1940-07-14, 77 y.o.   MRN: [id number]  HPI I reviewed health advisor's note, was available for consultation, and agree with documentation and plan.    Review of Systems     Objective:   Physical Exam         Assessment & Plan:

## 2017-11-09 NOTE — Progress Notes (Signed)
I reviewed health advisor's note, was available for consultation, and agree with documentation and plan.  

## 2017-11-13 HISTORY — PX: ESOPHAGOGASTRODUODENOSCOPY: SHX1529

## 2017-11-13 HISTORY — PX: COLONOSCOPY: SHX174

## 2017-11-17 ENCOUNTER — Other Ambulatory Visit: Payer: Self-pay

## 2017-11-17 ENCOUNTER — Telehealth: Payer: Self-pay

## 2017-11-17 ENCOUNTER — Ambulatory Visit (INDEPENDENT_AMBULATORY_CARE_PROVIDER_SITE_OTHER): Payer: Medicare Other | Admitting: Psychiatry

## 2017-11-17 ENCOUNTER — Encounter: Payer: Self-pay | Admitting: Psychiatry

## 2017-11-17 VITALS — BP 109/79 | HR 96 | Temp 97.3°F | Wt 200.6 lb

## 2017-11-17 DIAGNOSIS — F331 Major depressive disorder, recurrent, moderate: Secondary | ICD-10-CM | POA: Diagnosis not present

## 2017-11-17 MED ORDER — ARIPIPRAZOLE 10 MG PO TABS: 10.0000 mg | ORAL_TABLET | Freq: Every day | ORAL | 3 refills | Status: DC

## 2017-11-17 MED ORDER — ALPRAZOLAM 0.25 MG PO TABS: 0.2500 mg | ORAL_TABLET | Freq: Every evening | ORAL | 2 refills | Status: DC | PRN

## 2017-11-17 MED ORDER — VENLAFAXINE HCL ER 150 MG PO CP24: 150.0000 mg | ORAL_CAPSULE | Freq: Every day | ORAL | 1 refills | Status: DC

## 2017-11-17 NOTE — Progress Notes (Signed)
Psychiatric MD Progress Note   Patient Identification: Faith Santiago MRN:  [id number] Date of Evaluation:  11/17/2017 Referral Source: PCP Chief Complaint:   Chief Complaint    Follow-up; Medication Refill     Visit Diagnosis:    ICD-10-CM   1. MDD (major depressive disorder), recurrent episode, moderate (HCC) F33.1    Diagnosis:   Patient Active Problem List   Diagnosis Date Noted  . Prediabetes [R73.03] 10/14/2017  . Hypothyroidism [E03.9] 04/07/2015  . Vitamin D deficiency [E55.9] 04/02/2015  . Macrocytosis [D75.89] 03/28/2015  . Other fatigue [R53.83] 11/25/2014  . Advanced care planning/counseling discussion [Z71.89] 03/15/2014  . Health maintenance examination [Z00.00] 03/15/2014  . Postsurgical menopause [E89.40] 08/07/2011  . Medicare annual wellness visit, subsequent [Z00.00] 08/06/2011  . Osteoarthritis [M19.90] 06/07/2009  . Narcolepsy without cataplexy [G47.419] 05/31/2008  . HLD (hyperlipidemia) [E78.5] 06/03/2007  . MDD (major depressive disorder), recurrent episode, severe ([place]) [F33.2] 06/03/2007  . GERD [K21.9] 06/03/2007   History of Present Illness:   Patient is a 77 year old married female who presented for follow up.  She reported that she just returned after a 2-week visit from her sister in [place] as she owns a [place] over there.  She reported that it was a very good visit as they drove there.  She reported that her sister lives there during the summer as it is a [place] and she was talking in detail about the same.  She reported that they have been going there every summer.  Her sister comes down to her house in [place] during the winter season.  He reported that she has been compliant with her medication and has been doing well.  She was able to locate her old prescription of alprazolam and has been taking it on a regular basis.  She reported that the medications are helping her.  She sleeps well at night.  She currently denied having any side  effects of the medication.  Her memory is intact and she was able to answer all the questions appropriately.  She stated that she takes care of her husband as he has some cardiac issues.  She does not want to change her medications at this time.  She denied having any suicidal homicidal ideations or plans she denied having any perceptual disturbances.  She gets her medication from the mail order pharmacy.         Elements:  Severity:  moderate. Associated Signs/Symptoms: Depression Symptoms:  fatigue, anxiety, loss of energy/fatigue, (Hypo) Manic Symptoms:  none Anxiety Symptoms:  Excessive Worry, Psychotic Symptoms:  none PTSD Symptoms: Negative NA  Past Medical History:  Past Medical History:  Diagnosis Date  . Anemia, unspecified   . Anxiety   . Arthritis    hands R>L, neck and lower back  . Bimalleolar fracture of left ankle 11/19/2013   [place]  . Depression   . Esophageal reflux   . History of osteopenia    DEXA WNL 2008, 2015  . History of uterine cancer 1999   s/p hysterectomy  . HLD (hyperlipidemia)   . Narcolepsy   . Thyroid disease     Past Surgical History:  Procedure Laterality Date  . bilateral catarct removal  2010  . COLONOSCOPY  05/2003   WNL (Dr Velora Heckler)  . DEXA  03/2014   WNL T score +3.9  . ORIF ANKLE FRACTURE Left 11/19/2013   Procedure: LEFT ANKLE FRACTURE OPEN TREATMENT BIMALLEOLAR ANKLE INCLUDES INTERNAL FIXATION ;  Surgeon: Johnny Bridge, MD  .  RETINAL DETACHMENT REPAIR W/ SCLERAL BUCKLE LE Right 03/21/2016  . TOTAL ABDOMINAL HYSTERECTOMY  1999   uterine cancer   Family History:  Family History  Problem Relation Age of Onset  . Hypothyroidism Mother   . Stroke Mother        ministroke  . Coronary artery disease Mother 71       s/p some heart surgery  . Cancer Father        prostate  . Diabetes Neg Hx    Social History:   Social History   Socioeconomic History  . Marital status: Married    Spouse name: Not on file  . Number of  children: Not on file  . Years of education: Not on file  . Highest education level: Not on file  Occupational History  . Not on file  Social Needs  . Financial resource strain: Not on file  . Food insecurity:    Worry: Not on file    Inability: Not on file  . Transportation needs:    Medical: Not on file    Non-medical: Not on file  Tobacco Use  . Smoking status: Never Smoker  . Smokeless tobacco: Never Used  Substance and Sexual Activity  . Alcohol use: Yes    Alcohol/week: 4.2 - 7.2 oz    Types: 7 - 10 Glasses of wine per week    Comment: 1 drink /day  . Drug use: No  . Sexual activity: Never  Lifestyle  . Physical activity:    Days per week: Not on file    Minutes per session: Not on file  . Stress: Not on file  Relationships  . Social connections:    Talks on phone: Not on file    Gets together: Not on file    Attends religious service: Not on file    Active member of club or organization: Not on file    Attends meetings of clubs or organizations: Not on file    Relationship status: Not on file  Other Topics Concern  . 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 Has a son and daughter.  She enjoys quilting and belong to a [place] group. 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 She moved to [facility] and worked at [place] in [place].  Moved to [place] in 1976. Worked in [employer] and [employer] x 26 years and retired from there.   Musculoskeletal: Strength & Muscle Tone: within normal limits Gait & Station: normal Patient leans: N/A  Psychiatric Specialty Exam: Medication Refill   Depression          Review of Systems  Psychiatric/Behavioral: Positive for  depression.    Blood pressure 109/79, pulse 96, temperature (!) 97.3 F (36.3 C), temperature source Oral, weight 200 lb 9.6 oz (91 kg).Body mass index is 34.43 kg/m.  General Appearance: Casual and Fairly Groomed  Eye Contact:  Fair  Speech:  Clear and Coherent and Slow  Volume:  Normal  Mood:  Depressed  Affect:  Congruent and Depressed  Thought Process:  Goal Directed  Orientation:  Full (Time, Place, and Person)  Thought Content:  WDL  Suicidal Thoughts:  No  Homicidal Thoughts:  No  Memory:  Immediate;   Fair  Judgement:  Fair  Insight:  Fair  Psychomotor Activity:  Normal  Concentration:  Fair  Recall:  [employer] of Knowledge:Fair  Language: Fair  Akathisia:  No  Handed:  Right  AIMS (if indicated):    Assets:  Communication Skills Desire for Improvement Physical Health Social Support  ADL's:  Intact  Cognition: WNL  Sleep:     Is the patient at risk to self?  No. Has the patient been a risk to self in the past 6 months?  No. Has the patient been a risk to self within the distant past?  No. Is the patient a risk to others?  No. Has the patient been a risk to others in the past 6 months?  No. Has the patient been a risk to others within the distant past?  No.  Allergies:   Allergies  Allergen Reactions  . Ciprofloxacin     REACTION: sun induced rash   Current Medications: Current Outpatient Medications  Medication Sig Dispense Refill  . ALPRAZolam (XANAX) 0.25 MG tablet Take 1 tablet (0.25 mg total) by mouth at bedtime as needed for anxiety. 30 tablet 2  . ARIPiprazole (ABILIFY) 10 MG tablet Take 1 tablet (10 mg total) by mouth daily. 90 tablet 3  . aspirin 81 MG EC tablet Take 81 mg by mouth. Reported on 07/04/2015    . Cholecalciferol (VITAMIN D) 1000 UNITS capsule Take 1 capsule (1,000 Units total) by mouth daily.    Marland Kitchen levothyroxine (SYNTHROID, LEVOTHROID) 50 MCG tablet TAKE 1 TABLET (50 MCG TOTAL) BY MOUTH DAILY. 90 tablet 0  . Multiple Vitamin  (MULTIVITAMIN) tablet Take 1 tablet by mouth daily.    Marland Kitchen omeprazole (PRILOSEC) 40 MG capsule Take 1 capsule (40 mg total) by mouth daily. For 3 weeks then as needed 30 capsule 3  . venlafaxine XR (EFFEXOR-XR) 150 MG 24 hr capsule Take 1 capsule (150 mg total) by mouth daily with breakfast. 90 capsule 1  . vitamin B-12 (CYANOCOBALAMIN) 1000 MCG tablet Take 1,000 mcg by mouth daily.     No current facility-administered medications for this visit.     Previous Psychotropic Medications:  Prozac- worked well.  Wellbutrin Alprazolam- many years Celexa - no change   Substance Abuse History in the last 12 months:  No.  Consequences of Substance Abuse: Negative NA  Medical Decision Making:  Review of Psycho-Social Stressors (1)  Treatment Plan Summary: Medication management   Discussed with patient or the medications treatment risks benefits and alternatives Continue  Abilify 10 mg to help as an add-on to her antidepressant medications Continue  Effexor XR  150 mg and patient will take 1 pills in the morning.  Advised her to start taking alprazolam 0.25 mg  on a when necessary basis.  We will fax her prescription of alprazolam to the mail order pharmacy.  Follow-up in 3 months or earlier depending on her symptoms.     More than 50% of the time spent in psychoeducation, counseling and coordination of care.    This note was generated in part or whole with voice recognition software. Voice regonition is usually quite accurate but there are transcription errors that can and very often do occur. I apologize for any typographical errors that were not detected and corrected.     Rainey Pines, MD  8/5/201912:26 PM

## 2017-11-17 NOTE — Telephone Encounter (Signed)
faxed and confirmed rx for xanax .25mg  id # [id number] order # [id number]  # 30 with 2 refills

## 2017-11-18 ENCOUNTER — Other Ambulatory Visit: Payer: Self-pay | Admitting: Family Medicine

## 2017-11-19 ENCOUNTER — Encounter: Payer: Self-pay | Admitting: Family Medicine

## 2017-11-19 ENCOUNTER — Ambulatory Visit
Admission: RE | Admit: 2017-11-19 | Discharge: 2017-11-19 | Disposition: A | Discharge status: Home / Self Care | Payer: Medicare Other | Source: Ambulatory Visit | Attending: Family Medicine | Admitting: Family Medicine

## 2017-11-19 DIAGNOSIS — Z1231 Encounter for screening mammogram for malignant neoplasm of breast: Secondary | ICD-10-CM

## 2017-11-19 LAB — HM MAMMOGRAPHY

## 2017-11-26 ENCOUNTER — Encounter: Payer: Self-pay | Admitting: Internal Medicine

## 2017-11-26 ENCOUNTER — Ambulatory Visit: Payer: Medicare Other | Admitting: Internal Medicine

## 2017-11-26 VITALS — BP 84/52 | HR 100 | Ht 64.0 in | Wt 200.4 lb

## 2017-11-26 DIAGNOSIS — R194 Change in bowel habit: Secondary | ICD-10-CM

## 2017-11-26 DIAGNOSIS — R159 Full incontinence of feces: Secondary | ICD-10-CM

## 2017-11-26 DIAGNOSIS — R1319 Other dysphagia: Secondary | ICD-10-CM

## 2017-11-26 DIAGNOSIS — R1310 Dysphagia, unspecified: Secondary | ICD-10-CM | POA: Diagnosis not present

## 2017-11-26 DIAGNOSIS — K6289 Other specified diseases of anus and rectum: Secondary | ICD-10-CM | POA: Diagnosis not present

## 2017-11-26 DIAGNOSIS — R32 Unspecified urinary incontinence: Secondary | ICD-10-CM | POA: Diagnosis not present

## 2017-11-26 NOTE — Patient Instructions (Addendum)
You have been scheduled for an endoscopy and colonoscopy. Please follow the written instructions given to you at your visit today. Please pick up your prep supplies at the pharmacy. If you use inhalers (even only as needed), please bring them with you on the day of your procedure. Your physician has requested that you go to www.startemmi.com and enter the access code given to you at your visit today. This web site gives a general overview about your procedure. However, you should still follow specific instructions given to you by our office regarding your preparation for the procedure.  If you are age 21 or older, your body mass index should be between 23-30. Your Body mass index is 34.4 kg/m. If this is out of the aforementioned range listed, please consider follow up with your Primary Care Provider.  I appreciate the opportunity to care for you. Silvano Rusk, MD, [facility]

## 2017-11-26 NOTE — Progress Notes (Signed)
Faith Santiago 77 y.o. March 01, 1941 [id number]  Assessment & Plan:   Encounter Diagnoses  Name Primary?  . Esophageal dysphagia Yes  . Change in bowel habits   . Urinary and fecal incontinence   . Decreased anal sphincter tone      Plan will be for EGD and possible dilation.  Consider Abilify as a cause of dysphagia.as it can do that but use predates onset of sxs significantly.  Colonoscopy for change in bowel habits.  The risks and benefits as well as alternatives of endoscopic procedure(s) have been discussed and reviewed. All questions answered. The patient agrees to proceed.  Benefiber daily Pelvic floor physical therapy after the above  I appreciate the opportunity to care for her. QB:[id number], Garlon Hatchet, MD   Subjective:   Chief Complaint: heartburn, vomiting; diarrhea  HPI The patient is here with 2 main issues, first and foremost is about an 57-month history of intermittent dysphagia described as pressure in the suprasternal notch followed by acute regurgitation.  Steak chicken are main culprits.  There is no unintentional weight loss.  There is a severe heartburn sensation when that happens.  She cannot relate it to any new medications or anything like that, Abilify which can cause this is something she is been on for 2 years.  In the past similar time.  She is also had alternating bowel habits with not going and then loose stools at times with urge fecal incontinence.  Hemoccult negative last year colonoscopy previously done in 2005 that was negative.  She has a history of birth trauma with her first child and a large tear many years ago.  She also has stress urinary incontinence and urge urinary incontinence.  Other GI symptoms include bloating.   GI review of systems is otherwise negative  CBC normal recently except for mild macrocytosis which seems chronic Wt Readings from Last 3 Encounters:  11/28/17 200 lb 8 oz (90.9 kg)  11/26/17 200 lb 6.4 oz (90.9 kg)    10/15/17 201 lb 8 oz (91.4 kg)    Allergies  Allergen Reactions  . Ciprofloxacin     REACTION: sun induced rash   Current Meds  Medication Sig  . ALPRAZolam (XANAX) 0.25 MG tablet Take 1 tablet (0.25 mg total) by mouth at bedtime as needed for anxiety.  . ARIPiprazole (ABILIFY) 10 MG tablet Take 1 tablet (10 mg total) by mouth daily.  Marland Kitchen levothyroxine (SYNTHROID, LEVOTHROID) 50 MCG tablet TAKE 1 TABLET (50 MCG TOTAL) BY MOUTH DAILY.  Marland Kitchen omeprazole (PRILOSEC) 40 MG capsule Take 1 capsule (40 mg total) by mouth daily. For 3 weeks then as needed (Patient taking differently: Take 40 mg by mouth daily. )  . venlafaxine XR (EFFEXOR-XR) 150 MG 24 hr capsule Take 1 capsule (150 mg total) by mouth daily with breakfast.   Past Medical History:  Diagnosis Date  . Anemia, unspecified   . Anxiety   . Arthritis    hands R>L, neck and lower back  . Bimalleolar fracture of left ankle 11/19/2013   [place]  . Depression   . Esophageal reflux   . History of osteopenia    DEXA WNL 2008, 2015  . History of uterine cancer 1999   s/p hysterectomy  . HLD (hyperlipidemia)   . Narcolepsy   . Thyroid disease    Past Surgical History:  Procedure Laterality Date  . bilateral catarct removal  2010  . COLONOSCOPY  05/2003   WNL (Dr Velora Heckler)  . DEXA  03/2014  WNL T score +3.9  . ORIF ANKLE FRACTURE Left 11/19/2013   Procedure: LEFT ANKLE FRACTURE OPEN TREATMENT BIMALLEOLAR ANKLE INCLUDES INTERNAL FIXATION ;  Surgeon: Johnny Bridge, MD  . RETINAL DETACHMENT REPAIR W/ SCLERAL BUCKLE LE Right 03/21/2016  . TOTAL ABDOMINAL HYSTERECTOMY  1999   uterine cancer   Social History   Social History Narrative   caffeine: none   Lives with husband, has 2 grown children, 1 cat   Occupation: retired, was OT for [employer]   Activity: no regular exercise, to start silver sneakers   Diet: overeating, good amt water, vegetables daily, occasional fruits   Never smoker   EtOH 1/day   family history includes Coronary  artery disease (age of onset: 67) in her mother; Hypothyroidism in her mother; Prostate cancer in her father; Stroke in her mother.   Review of Systems All other ROS negative or as per HPI  Objective:   Physical Exam @BP  (!) 84/52   Pulse 100   Ht 5\' 4"  (1.626 m)   Wt 200 lb 6.4 oz (90.9 kg)   BMI 34.40 kg/m @  General:  Well-developed, well-nourished and in no acute distress she is obese Eyes:  anicteric. ENT:   Mouth and posterior pharynx free of lesions.  Neck:   supple w/o thyromegaly or mass.  Lungs: Clear to auscultation bilaterally. Heart:   S1S2, no rubs, murmurs, gallops. Abdomen: Obese soft, non-tender, no hepatosplenomegaly, hernia, or mass and BS+.  Rectal:  [name], [place] present.   Anoderm inspection revealed feces in the perianal skin and in the underwear a small amount of brown Anal wink was absent Digital exam revealed decreased resting tone and voluntary squeeze. No mass or rectocele present. Simulated defecation with valsalva revealed decreased abdominal contraction and descent and decreased relaxation of the anal sphincter.    Lymph:  no cervical or supraclavicular adenopathy. Extremities:   no edema, cyanosis or clubbing Skin   no rash. Neuro:  A&O x 3.  Psych:  appropriate mood and  Affect.   Data Reviewed: See HPI

## 2017-11-28 ENCOUNTER — Encounter: Payer: Self-pay | Admitting: Family Medicine

## 2017-11-28 ENCOUNTER — Ambulatory Visit (INDEPENDENT_AMBULATORY_CARE_PROVIDER_SITE_OTHER): Payer: Medicare Other | Admitting: Family Medicine

## 2017-11-28 VITALS — BP 120/74 | HR 100 | Temp 97.8°F | Ht 64.0 in | Wt 200.5 lb

## 2017-11-28 DIAGNOSIS — E785 Hyperlipidemia, unspecified: Secondary | ICD-10-CM | POA: Diagnosis not present

## 2017-11-28 DIAGNOSIS — E559 Vitamin D deficiency, unspecified: Secondary | ICD-10-CM | POA: Diagnosis not present

## 2017-11-28 DIAGNOSIS — Z0000 Encounter for general adult medical examination without abnormal findings: Secondary | ICD-10-CM | POA: Diagnosis not present

## 2017-11-28 DIAGNOSIS — F332 Major depressive disorder, recurrent severe without psychotic features: Secondary | ICD-10-CM

## 2017-11-28 DIAGNOSIS — R7303 Prediabetes: Secondary | ICD-10-CM

## 2017-11-28 DIAGNOSIS — E039 Hypothyroidism, unspecified: Secondary | ICD-10-CM

## 2017-11-28 DIAGNOSIS — Z7189 Other specified counseling: Secondary | ICD-10-CM | POA: Diagnosis not present

## 2017-11-28 MED ORDER — VITAMIN D3 25 MCG (1000 UT) PO CAPS: 1.0000 | ORAL_CAPSULE | Freq: Every day | ORAL | Status: DC

## 2017-11-28 NOTE — Patient Instructions (Addendum)
Bring me copy of your advanced directives to update your chart. Cholesterol levels worsened! Work on [employer] - increase fish, fruits, vegetables, legumes. Return in 4-6 months for lab visit only to recheck cholesterol levels - to make sure we're improving.  Pelvic exam today.  Restart vitamin D 1,000 units daily  Health Maintenance, Female Adopting a healthy lifestyle and getting preventive care can go a long way to promote health and wellness. Talk with your health care provider about what schedule of regular examinations is right for you. This is a good chance for you to check in with your provider about disease prevention and staying healthy. In between checkups, there are plenty of things you can do on your own. Experts have done a lot of research about which lifestyle changes and preventive measures are most likely to keep you healthy. Ask your health care provider for more information. Weight and diet Eat a healthy diet  Be sure to include plenty of vegetables, fruits, low-fat dairy products, and lean protein.  Do not eat a lot of foods high in solid fats, added sugars, or salt.  Get regular exercise. This is one of the most important things you can do for your health. ? Most adults should exercise for at least 150 minutes each week. The exercise should increase your heart rate and make you sweat (moderate-intensity exercise). ? Most adults should also do strengthening exercises at least twice a week. This is in addition to the moderate-intensity exercise.  Maintain a healthy weight  Body mass index (BMI) is a measurement that can be used to identify possible weight problems. It estimates body fat based on height and weight. Your health care provider can help determine your BMI and help you achieve or maintain a healthy weight.  For females 65 years of age and older: ? A BMI below 18.5 is considered underweight. ? A BMI of 18.5 to 24.9 is normal. ? A BMI of 25 to 29.9 is  considered overweight. ? A BMI of 30 and above is considered obese.  Watch levels of cholesterol and blood lipids  You should start having your blood tested for lipids and cholesterol at 75 years of age, then have this test every 5 years.  You may need to have your cholesterol levels checked more often if: ? Your lipid or cholesterol levels are high. ? You are older than 75 years of age. ? You are at high risk for heart disease.  Cancer screening Lung Cancer  Lung cancer screening is recommended for adults 8-24 years old who are at high risk for lung cancer because of a history of smoking.  A yearly low-dose CT scan of the lungs is recommended for people who: ? Currently smoke. ? Have quit within the past 15 years. ? Have at least a 30-pack-year history of smoking. A pack year is smoking an average of one pack of cigarettes a day for 1 year.  Yearly screening should continue until it has been 15 years since you quit.  Yearly screening should stop if you develop a health problem that would prevent you from having lung cancer treatment.  Breast Cancer  Practice breast self-awareness. This means understanding how your breasts normally appear and feel.  It also means doing regular breast self-exams. Let your health care provider know about any changes, no matter how small.  If you are in your 20s or 30s, you should have a clinical breast exam (CBE) by a health care provider every 1-3  years as part of a regular health exam.  If you are 27 or older, have a CBE every year. Also consider having a breast X-ray (mammogram) every year.  If you have a family history of breast cancer, talk to your health care provider about genetic screening.  If you are at high risk for breast cancer, talk to your health care provider about having an MRI and a mammogram every year.  Breast cancer gene (BRCA) assessment is recommended for women who have family members with BRCA-related cancers.  BRCA-related cancers include: ? Breast. ? Ovarian. ? Tubal. ? Peritoneal cancers.  Results of the assessment will determine the need for genetic counseling and BRCA1 and BRCA2 testing.  Cervical Cancer Your health care provider may recommend that you be screened regularly for cancer of the pelvic organs (ovaries, uterus, and vagina). This screening involves a pelvic examination, including checking for microscopic changes to the surface of your cervix (Pap test). You may be encouraged to have this screening done every 3 years, beginning at age 21.  For women ages 66-65, health care providers may recommend pelvic exams and Pap testing every 3 years, or they may recommend the Pap and pelvic exam, combined with testing for human papilloma virus (HPV), every 5 years. Some types of HPV increase your risk of cervical cancer. Testing for HPV may also be done on women of any age with unclear Pap test results.  Other health care providers may not recommend any screening for nonpregnant women who are considered low risk for pelvic cancer and who do not have symptoms. Ask your health care provider if a screening pelvic exam is right for you.  If you have had past treatment for cervical cancer or a condition that could lead to cancer, you need Pap tests and screening for cancer for at least 20 years after your treatment. If Pap tests have been discontinued, your risk factors (such as having a new sexual partner) need to be reassessed to determine if screening should resume. Some women have medical problems that increase the chance of getting cervical cancer. In these cases, your health care provider may recommend more frequent screening and Pap tests.  Colorectal Cancer  This type of cancer can be detected and often prevented.  Routine colorectal cancer screening usually begins at 75 years of age and continues through 75 years of age.  Your health care provider may recommend screening at an earlier age if  you have risk factors for colon cancer.  Your health care provider may also recommend using home test kits to check for hidden blood in the stool.  A small camera at the end of a tube can be used to examine your colon directly (sigmoidoscopy or colonoscopy). This is done to check for the earliest forms of colorectal cancer.  Routine screening usually begins at age 12.  Direct examination of the colon should be repeated every 5-10 years through 75 years of age. However, you may need to be screened more often if early forms of precancerous polyps or small growths are found.  Skin Cancer  Check your skin from head to toe regularly.  Tell your health care provider about any new moles or changes in moles, especially if there is a change in a mole's shape or color.  Also tell your health care provider if you have a mole that is larger than the size of a pencil eraser.  Always use sunscreen. Apply sunscreen liberally and repeatedly throughout the day.  Protect yourself  by wearing long sleeves, pants, a wide-brimmed hat, and sunglasses whenever you are outside.  Heart disease, diabetes, and high blood pressure  High blood pressure causes heart disease and increases the risk of stroke. High blood pressure is more likely to develop in: ? People who have blood pressure in the high end of the normal range (130-139/85-89 mm Hg). ? People who are overweight or obese. ? People who are African American.  If you are 40-66 years of age, have your blood pressure checked every 3-5 years. If you are 47 years of age or older, have your blood pressure checked every year. You should have your blood pressure measured twice-once when you are at a hospital or clinic, and once when you are not at a hospital or clinic. Record the average of the two measurements. To check your blood pressure when you are not at a hospital or clinic, you can use: ? An automated blood pressure machine at a pharmacy. ? A home blood  pressure monitor.  If you are between 41 years and 64 years old, ask your health care provider if you should take aspirin to prevent strokes.  Have regular diabetes screenings. This involves taking a blood sample to check your fasting blood sugar level. ? If you are at a normal weight and have a low risk for diabetes, have this test once every three years after 75 years of age. ? If you are overweight and have a high risk for diabetes, consider being tested at a younger age or more often. Preventing infection Hepatitis B  If you have a higher risk for hepatitis B, you should be screened for this virus. You are considered at high risk for hepatitis B if: ? You were born in a country where hepatitis B is common. Ask your health care provider which countries are considered high risk. ? Your parents were born in a high-risk country, and you have not been immunized against hepatitis B (hepatitis B vaccine). ? You have HIV or AIDS. ? You use needles to inject street drugs. ? You live with someone who has hepatitis B. ? You have had sex with someone who has hepatitis B. ? You get hemodialysis treatment. ? You take certain medicines for conditions, including cancer, organ transplantation, and autoimmune conditions.  Hepatitis C  Blood testing is recommended for: ? Everyone born from [birth year] through 1963. ? Anyone with known risk factors for hepatitis C.  Sexually transmitted infections (STIs)  You should be screened for sexually transmitted infections (STIs) including gonorrhea and chlamydia if: ? You are sexually active and are younger than 75 years of age. ? You are older than 75 years of age and your health care provider tells you that you are at risk for this type of infection. ? Your sexual activity has changed since you were last screened and you are at an increased risk for chlamydia or gonorrhea. Ask your health care provider if you are at risk.  If you do not have HIV, but are at risk,  it may be recommended that you take a prescription medicine daily to prevent HIV infection. This is called pre-exposure prophylaxis (PrEP). You are considered at risk if: ? You are sexually active and do not regularly use condoms or know the HIV status of your partner(s). ? You take drugs by injection. ? You are sexually active with a partner who has HIV.  Talk with your health care provider about whether you are at high risk of being infected  with HIV. If you choose to begin PrEP, you should first be tested for HIV. You should then be tested every 3 months for as long as you are taking PrEP. Pregnancy  If you are premenopausal and you may become pregnant, ask your health care provider about preconception counseling.  If you may become pregnant, take 400 to 800 micrograms (mcg) of folic acid every day.  If you want to prevent pregnancy, talk to your health care provider about birth control (contraception). Osteoporosis and menopause  Osteoporosis is a disease in which the bones lose minerals and strength with aging. This can result in serious bone fractures. Your risk for osteoporosis can be identified using a bone density scan.  If you are 6 years of age or older, or if you are at risk for osteoporosis and fractures, ask your health care provider if you should be screened.  Ask your health care provider whether you should take a calcium or vitamin D supplement to lower your risk for osteoporosis.  Menopause may have certain physical symptoms and risks.  Hormone replacement therapy may reduce some of these symptoms and risks. Talk to your health care provider about whether hormone replacement therapy is right for you. Follow these instructions at home:  Schedule regular health, dental, and eye exams.  Stay current with your immunizations.  Do not use any tobacco products including cigarettes, chewing tobacco, or electronic cigarettes.  If you are pregnant, do not drink  alcohol.  If you are breastfeeding, limit how much and how often you drink alcohol.  Limit alcohol intake to no more than 1 drink per day for nonpregnant women. One drink equals 12 ounces of beer, 5 ounces of wine, or 1 ounces of hard liquor.  Do not use street drugs.  Do not share needles.  Ask your health care provider for help if you need support or information about quitting drugs.  Tell your health care provider if you often feel depressed.  Tell your health care provider if you have ever been abused or do not feel safe at home. This information is not intended to replace advice given to you by your health care provider. Make sure you discuss any questions you have with your health care provider. Document Released: 10/15/2010 Document Revised: 09/07/2015 Document Reviewed: 01/03/2015 Elsevier Interactive Patient Education  [employer].

## 2017-11-28 NOTE — Assessment & Plan Note (Signed)
Chronic, stable continue current regimen.  

## 2017-11-28 NOTE — Assessment & Plan Note (Signed)
Reviewed with patient

## 2017-11-28 NOTE — Assessment & Plan Note (Signed)
Chronic off meds. Encouraged low chol diet, increase fish, fruits, vegetables  The 10-year ASCVD risk score Mikey Bussing DC Jr., et al., 2013) is: 14.9%   Values used to calculate the score:     Age: 77 years     Sex: Female     Is Non-Hispanic African American: No     Diabetic: No     Tobacco smoker: No     Systolic Blood Pressure: 414 mmHg     Is BP treated: No     HDL Cholesterol: 89.1 mg/dL     Total Cholesterol: 301 mg/dL

## 2017-11-28 NOTE — Assessment & Plan Note (Signed)
Followed by psych - appreciate their care.

## 2017-11-28 NOTE — Progress Notes (Signed)
BP 120/74 (BP Location: Left Arm, Patient Position: Sitting, Cuff Size: Normal)   Pulse 100   Temp 97.8 F (36.6 C) (Oral)   Ht 5\' 4"  (1.626 m)   Wt 200 lb 8 oz (90.9 kg)   SpO2 95%   BMI 34.42 kg/m    CC: CPE Subjective:    Patient ID: MANNA GOSE, female    DOB: Sep 02, 1940, 77 y.o.   MRN: [id number]  HPI: LATASHA BUCZKOWSKI is a 77 y.o. female presenting on 11/28/2017 for Annual Exam (Pt 2.)   Saw Katha Cabal last month for medicare wellness visit. Note reviewed.  Saw Dr Carlean Purl this week for GI issues - pending EGD/colonoscopy for esophageal dysphagia.    Preventative: COLONOSCOPY Date: 05/2003 WNL (Dr Velora Heckler) - pending repeat next week 3D mammogram 11/2017 WNL. Declines breast exam. occasional does at home.  Well woman exam - s/p total hysterectomy 1999 (ovaries removed) for uterine cancer.She did have radiation afterwards. Pap smear WNL until 08/2011. Tapered off estrogen therapy. Pelvic exam alone performed 11/2017, WNL.  DEXA Date: 03/2014 WNL T score +3.9  Flu yearly.  Pneumovax 2011, prevnar 2015.  Td 2009.  Shingrix - interested in this. Discussed to get at pharmacy.  Advanced directives - living will at home. Would want husband to be HCPOA. Will bring me copy.  Seat belt use discussed Sunscreen use discussed, no changing moles on skin. Non smoker Alcohol - 1 drink/day Dentist Q6 mo Eye exam yearly  Caffeine: couple cups/day  Lives with husband, 2 grown children, 1 cat  Occupation: retired, was OT for [employer]  Activity: planning to sign up for Y Diet: overeating, good amt water, vegetables daily, occasional fruits  Relevant past medical, surgical, family and social history reviewed and updated as indicated. Interim medical history since our last visit reviewed. Allergies and medications reviewed and updated. Outpatient Medications Prior to Visit  Medication Sig Dispense Refill  . ALPRAZolam (XANAX) 0.25 MG tablet Take 1 tablet (0.25 mg total) by mouth at bedtime as  needed for anxiety. 30 tablet 2  . ARIPiprazole (ABILIFY) 10 MG tablet Take 1 tablet (10 mg total) by mouth daily. 90 tablet 3  . levothyroxine (SYNTHROID, LEVOTHROID) 50 MCG tablet TAKE 1 TABLET (50 MCG TOTAL) BY MOUTH DAILY. 90 tablet 0  . omeprazole (PRILOSEC) 40 MG capsule Take 1 capsule (40 mg total) by mouth daily. For 3 weeks then as needed (Patient taking differently: Take 40 mg by mouth daily. ) 30 capsule 3  . venlafaxine XR (EFFEXOR-XR) 150 MG 24 hr capsule Take 1 capsule (150 mg total) by mouth daily with breakfast. 90 capsule 1   No facility-administered medications prior to visit.      Per HPI unless specifically indicated in ROS section below Review of Systems  Constitutional: Negative for activity change, appetite change, chills, fatigue, fever and unexpected weight change.  HENT: Negative for hearing loss.   Eyes: Negative for visual disturbance.  Respiratory: Negative for cough, chest tightness, shortness of breath and wheezing.   Cardiovascular: Negative for chest pain, palpitations and leg swelling.  Gastrointestinal: Positive for diarrhea (occasional - pending colonoscopy). Negative for abdominal distention, abdominal pain, blood in stool, constipation, nausea and vomiting.  Genitourinary: Negative for difficulty urinating and hematuria.  Musculoskeletal: Negative for arthralgias, myalgias and neck pain.  Skin: Negative for rash.  Neurological: Negative for dizziness, seizures, syncope and headaches.  Hematological: Negative for adenopathy. Does not bruise/bleed easily.  Psychiatric/Behavioral: Negative for dysphoric mood. The patient is not nervous/anxious.  Objective:    BP 120/74 (BP Location: Left Arm, Patient Position: Sitting, Cuff Size: Normal)   Pulse 100   Temp 97.8 F (36.6 C) (Oral)   Ht 5\' 4"  (1.626 m)   Wt 200 lb 8 oz (90.9 kg)   SpO2 95%   BMI 34.42 kg/m   Wt Readings from Last 3 Encounters:  11/28/17 200 lb 8 oz (90.9 kg)  11/26/17 200  lb 6.4 oz (90.9 kg)  10/15/17 201 lb 8 oz (91.4 kg)    Physical Exam  Constitutional: She is oriented to person, place, and time. She appears well-developed and well-nourished. No distress.  HENT:  Head: Normocephalic and atraumatic.  Right Ear: Hearing, tympanic membrane, external ear and ear canal normal.  Left Ear: Hearing, tympanic membrane, external ear and ear canal normal.  Nose: Nose normal.  Mouth/Throat: Uvula is midline, oropharynx is clear and moist and mucous membranes are normal. No oropharyngeal exudate, posterior oropharyngeal edema or posterior oropharyngeal erythema.  Eyes: Pupils are equal, round, and reactive to light. Conjunctivae and EOM are normal. No scleral icterus.  Neck: Normal range of motion. Neck supple. Carotid bruit is not present. No thyromegaly present.  Cardiovascular: Normal rate, regular rhythm, normal heart sounds and intact distal pulses.  No murmur heard. Pulses:      Radial pulses are 2+ on the right side, and 2+ on the left side.  Pulmonary/Chest: Effort normal and breath sounds normal. No respiratory distress. She has no wheezes. She has no rales.  Crackles R>L bibasilarly - anticipate atx  Abdominal: Soft. Bowel sounds are normal. She exhibits no distension and no mass. There is no tenderness. There is no rebound and no guarding.  Genitourinary: Vagina normal. Pelvic exam was performed with patient supine. There is no rash, tenderness or injury on the right labia. There is no rash, tenderness, lesion or injury on the left labia. No erythema or tenderness in the vagina. No signs of injury around the vagina. No vaginal discharge found.  Genitourinary Comments: Cervix, uterus absent Bimanual not performed  Musculoskeletal: Normal range of motion. She exhibits no edema.  Lymphadenopathy:    She has no cervical adenopathy.  Neurological: She is alert and oriented to person, place, and time.  CN grossly intact, station and gait intact  Skin: Skin is  warm and dry. No rash noted.  Psychiatric: She has a normal mood and affect. Her behavior is normal. Judgment and thought content normal.  Nursing note and vitals reviewed.  Results for orders placed or performed in visit on 11/19/17  HM MAMMOGRAPHY  Result Value Ref Range   HM Mammogram 0-4 Bi-Rad 0-4 Bi-Rad, Self Reported Normal      Assessment & Plan:   Problem List Items Addressed This Visit    Vitamin D deficiency    rec restart vit D 1000 IU daily.       Prediabetes    Reviewed with patient.       MDD (major depressive disorder), recurrent episode, severe ([place])    Followed by psych - appreciate their care.       Hypothyroidism    Chronic, stable continue current regimen.       HLD (hyperlipidemia)    Chronic off meds. Encouraged low chol diet, increase fish, fruits, vegetables  The 10-year ASCVD risk score Mikey Bussing DC Jr., et al., 2013) is: 14.9%   Values used to calculate the score:     Age: 82 years     Sex: Female  Is Non-Hispanic African American: No     Diabetic: No     Tobacco smoker: No     Systolic Blood Pressure: 315 mmHg     Is BP treated: No     HDL Cholesterol: 89.1 mg/dL     Total Cholesterol: 301 mg/dL       Health maintenance examination - Primary    Preventative protocols reviewed and updated unless pt declined. Discussed healthy diet and lifestyle.       Advanced care planning/counseling discussion    Advanced directives - living will at home. Would want husband to be HCPOA. Will bring me copy.           Meds ordered this encounter  Medications  . Cholecalciferol (VITAMIN D3) 1000 units CAPS    Sig: Take 1 capsule (1,000 Units total) by mouth daily.    Dispense:  30 capsule   No orders of the defined types were placed in this encounter.   Follow up plan: Return in about 1 year (around 11/29/2018) for annual exam, prior fasting for blood work, medicare wellness visit.  [name], MD

## 2017-11-28 NOTE — Assessment & Plan Note (Signed)
Advanced directives - living will at home. Would want husband to be HCPOA. Will bring me copy.

## 2017-11-28 NOTE — Assessment & Plan Note (Signed)
Preventative protocols reviewed and updated unless pt declined. Discussed healthy diet and lifestyle.  

## 2017-11-28 NOTE — Assessment & Plan Note (Signed)
rec restart vit D 1000 IU daily. 

## 2017-11-29 ENCOUNTER — Encounter: Payer: Self-pay | Admitting: Internal Medicine

## 2017-12-05 ENCOUNTER — Encounter: Payer: Self-pay | Admitting: Internal Medicine

## 2017-12-05 ENCOUNTER — Ambulatory Visit: Payer: Medicare Other | Admitting: Internal Medicine

## 2017-12-05 VITALS — BP 109/56 | HR 72 | Temp 96.9°F | Resp 10

## 2017-12-05 DIAGNOSIS — D120 Benign neoplasm of cecum: Secondary | ICD-10-CM

## 2017-12-05 DIAGNOSIS — D129 Benign neoplasm of anus and anal canal: Secondary | ICD-10-CM

## 2017-12-05 DIAGNOSIS — R1310 Dysphagia, unspecified: Secondary | ICD-10-CM

## 2017-12-05 DIAGNOSIS — D128 Benign neoplasm of rectum: Secondary | ICD-10-CM

## 2017-12-05 DIAGNOSIS — K222 Esophageal obstruction: Secondary | ICD-10-CM

## 2017-12-05 DIAGNOSIS — R194 Change in bowel habit: Secondary | ICD-10-CM

## 2017-12-05 MED ORDER — SODIUM CHLORIDE 0.9 % IV SOLN: 500.0000 mL | Freq: Once | INTRAVENOUS | Status: DC

## 2017-12-05 NOTE — Patient Instructions (Addendum)
You have an esophageal stricture that I dilated today - you will swallow better but I need you to return and dilate it again.  Take the omeprazole every day to help this stay open.  Also saw large hiatal hernia.  I removed 3 tiny colon polyps. I will let you know pathology results and when to have another routine colonoscopy by mail and/or My Chart.  You also have a condition called diverticulosis - common and not usually a problem. Please read the handout provided.  We will refer you to physical therapy to help with incontinence.  Take Benefiber 1-2 tablespoons daily to he;p regulate bowel habits.  I appreciate the opportunity to care for you. Gatha Mayer, MD, [facility]  Dilation diet handout given to patient. Diverticulosis handout given to patient. Polyp handout given to patient. Hiatal hernia handout given to patient.  YOU HAD AN ENDOSCOPIC PROCEDURE TODAY AT [facility]:   Refer to the procedure report that was given to you for any specific questions about what was found during the examination.  If the procedure report does not answer your questions, please call your gastroenterologist to clarify.  If you requested that your care partner not be given the details of your procedure findings, then the procedure report has been included in a sealed envelope for you to review at your convenience later.  YOU SHOULD EXPECT: Some feelings of bloating in the abdomen. Passage of more gas than usual.  Walking can help get rid of the air that was put into your GI tract during the procedure and reduce the bloating. If you had a lower endoscopy (such as a colonoscopy or flexible sigmoidoscopy) you may notice spotting of blood in your stool or on the toilet paper. If you underwent a bowel prep for your procedure, you may not have a normal bowel movement for a few days.  Please Note:  You might notice some irritation and congestion in your nose or some drainage.  This is from  the oxygen used during your procedure.  There is no need for concern and it should clear up in a day or so.  SYMPTOMS TO REPORT IMMEDIATELY:   Following lower endoscopy (colonoscopy or flexible sigmoidoscopy):  Excessive amounts of blood in the stool  Significant tenderness or worsening of abdominal pains  Swelling of the abdomen that is new, acute  Fever of 100F or higher   Following upper endoscopy (EGD)  Vomiting of blood or coffee ground material  New chest pain or pain under the shoulder blades  Painful or persistently difficult swallowing  New shortness of breath  Fever of 100F or higher  Black, tarry-looking stools  For urgent or emergent issues, a gastroenterologist can be reached at any hour by calling [phone].   DIET:  We do recommend a small meal at first, but then you may proceed to your regular diet.  Drink plenty of fluids but you should avoid alcoholic beverages for 24 hours.  ACTIVITY:  You should plan to take it easy for the rest of today and you should NOT DRIVE or use heavy machinery until tomorrow (because of the sedation medicines used during the test).    FOLLOW UP: Our staff will call the number listed on your records the next business day following your procedure to check on you and address any questions or concerns that you may have regarding the information given to you following your procedure. If we do not reach you, we will leave  a message.  However, if you are feeling well and you are not experiencing any problems, there is no need to return our call.  We will assume that you have returned to your regular daily activities without incident.  If any biopsies were taken you will be contacted by phone or by letter within the next 1-3 weeks.  Please call us at [phone] if you have not heard about the biopsies in 3 weeks.    SIGNATURES/CONFIDENTIALITY: You and/or your care partner have signed paperwork which will be entered into your electronic  medical record.  These signatures attest to the fact that that the information above on your After Visit Summary has been reviewed and is understood.  Full responsibility of the confidentiality of this discharge information lies with you and/or your care-partner.

## 2017-12-05 NOTE — Progress Notes (Signed)
Called to room to assist during endoscopic procedure.  Patient ID and intended procedure confirmed with present staff. Received instructions for my participation in the procedure from the performing physician.  

## 2017-12-05 NOTE — Op Note (Signed)
[place] Patient Name: Mychal Durio Procedure Date: 12/05/2017 8:33 AM MRN: [id number] Endoscopist: Gatha Mayer , MD Age: 77 Referring MD:  Date of Birth: 12/31/1940 Gender: Female Account #: [id number] Procedure:                Upper GI endoscopy Indications:              Dysphagia Medicines:                Propofol per Anesthesia, Monitored Anesthesia Care Procedure:                Pre-Anesthesia Assessment:                           - Prior to the procedure, a History and Physical                            was performed, and patient medications and                            allergies were reviewed. The patient's tolerance of                            previous anesthesia was also reviewed. The risks                            and benefits of the procedure and the sedation                            options and risks were discussed with the patient.                            All questions were answered, and informed consent                            was obtained. Prior Anticoagulants: The patient has                            taken no previous anticoagulant or antiplatelet                            agents. ASA Grade Assessment: II - A patient with                            mild systemic disease. After reviewing the risks                            and benefits, the patient was deemed in                            satisfactory condition to undergo the procedure.                           After obtaining informed consent, the endoscope was  passed under direct vision. Throughout the                            procedure, the patient's blood pressure, pulse, and                            oxygen saturations were monitored continuously. The                            Model GIF-HQ190 [id number]) scope was introduced                            through the mouth, and advanced to the second part                            of duodenum. The  upper GI endoscopy was                            accomplished without difficulty. The patient                            tolerated the procedure well. Scope In: Scope Out: Findings:                 One benign-appearing, intrinsic severe stenosis was                            found at the gastroesophageal junction. This                            stenosis measured 1 cm (inner diameter) x 1 cm (in                            length). The stenosis was traversed after dilation.                            A TTS dilator was passed through the scope.                            Dilation with a 13.5-14.5-15.5 mm balloon dilator                            was performed to 15.5 mm. The dilation site was                            examined and showed moderate improvement in luminal                            narrowing. Estimated blood loss was minimal.                           An 8 cm hiatal hernia was present.  The exam was otherwise without abnormality.                           The cardia and gastric fundus were normal on                            retroflexion. Complications:            No immediate complications. Estimated Blood Loss:     Estimated blood loss was minimal. Impression:               - Benign-appearing esophageal stenosis. Dilated.                           - 8 cm hiatal hernia.                           - The examination was otherwise normal.                           - No specimens collected. Recommendation:           - Patient has a contact number available for                            emergencies. The signs and symptoms of potential                            delayed complications were discussed with the                            patient. Return to normal activities tomorrow.                            Written discharge instructions were provided to the                            patient.                           - Clear liquids x 1 hour then  soft foods rest of                            day. Start prior diet tomorrow.                           - Continue present medications.                           - Repeat upper endoscopy 2-4 weeks approx for                            retreatment.                           - Stay on omeprazole daily                           -  See the other procedure note for documentation of                            additional recommendations. Gatha Mayer, MD 12/05/2017 9:17:22 AM This report has been signed electronically.

## 2017-12-05 NOTE — Op Note (Signed)
[place] Patient Name: Faith Santiago Procedure Date: 12/05/2017 8:32 AM MRN: [id number] Endoscopist: Gatha Mayer , MD Age: 77 Referring MD:  Date of Birth: 1940-09-12 Gender: Female Account #: [id number] Procedure:                Colonoscopy Indications:              Change in bowel habits Medicines:                Propofol per Anesthesia, Monitored Anesthesia Care Procedure:                Pre-Anesthesia Assessment:                           - Prior to the procedure, a History and Physical                            was performed, and patient medications and                            allergies were reviewed. The patient's tolerance of                            previous anesthesia was also reviewed. The risks                            and benefits of the procedure and the sedation                            options and risks were discussed with the patient.                            All questions were answered, and informed consent                            was obtained. Prior Anticoagulants: The patient has                            taken no previous anticoagulant or antiplatelet                            agents. ASA Grade Assessment: II - A patient with                            mild systemic disease. After reviewing the risks                            and benefits, the patient was deemed in                            satisfactory condition to undergo the procedure.                           After obtaining informed consent, the colonoscope  was passed under direct vision. Throughout the                            procedure, the patient's blood pressure, pulse, and                            oxygen saturations were monitored continuously. The                            Model [id number]-[id number]) scope was introduced                            through the anus and advanced to the the terminal                            ileum, with  identification of the appendiceal                            orifice and IC valve. The colonoscopy was somewhat                            difficult due to a redundant colon and significant                            looping. Successful completion of the procedure was                            aided by applying abdominal pressure. The patient                            tolerated the procedure well. The quality of the                            bowel preparation was excellent. The terminal                            ileum, ileocecal valve, appendiceal orifice, and                            rectum were photographed. Scope In: 8:52:12 AM Scope Out: 9:08:33 AM Scope Withdrawal Time: 0 hours 12 minutes 10 seconds  Total Procedure Duration: 0 hours 16 minutes 21 seconds  Findings:                 The perianal examination was normal.                           The digital rectal exam findings include decreased                            sphincter tone.                           Three sessile polyps were found in the rectum and  cecum. The polyps were diminutive in size. These                            polyps were removed with a cold snare. Resection                            and retrieval were complete. Verification of                            patient identification for the specimen was done.                            Estimated blood loss was minimal.                           A few diverticula were found in the sigmoid colon.                           The terminal ileum appeared normal.                           The exam was otherwise without abnormality on                            direct and retroflexion views. Complications:            No immediate complications. Estimated Blood Loss:     Estimated blood loss was minimal. Impression:               - Decreased sphincter tone found on digital rectal                            exam.                           -  Three diminutive polyps in the rectum and in the                            cecum, removed with a cold snare. Resected and                            retrieved.                           - Diverticulosis in the sigmoid colon.                           - The examined portion of the ileum was normal.                           - The examination was otherwise normal on direct                            and retroflexion views. Recommendation:           - Patient has a contact number available for  emergencies. The signs and symptoms of potential                            delayed complications were discussed with the                            patient. Return to normal activities tomorrow.                            Written discharge instructions were provided to the                            patient.                           - Clear liquids x 1 hour then soft foods rest of                            day. Start prior diet tomorrow.                           - Repeat colonoscopy is recommended. The                            colonoscopy date will be determined after pathology                            results from today's exam become available for                            review.                           - My office weill refer for pelvic PT in [place]                            at [facility] to treat fecal                            incontinence and decreased anal sphincter tone                           - Take benefiber 1-2 tablespoons daily Gatha Mayer, MD 12/05/2017 9:25:16 AM This report has been signed electronically.

## 2017-12-05 NOTE — Progress Notes (Signed)
A/ox3, pleased with MAC, report to RN 

## 2017-12-08 ENCOUNTER — Other Ambulatory Visit: Payer: Self-pay

## 2017-12-08 ENCOUNTER — Telehealth: Payer: Self-pay

## 2017-12-08 DIAGNOSIS — K6289 Other specified diseases of anus and rectum: Secondary | ICD-10-CM

## 2017-12-08 DIAGNOSIS — R159 Full incontinence of feces: Secondary | ICD-10-CM

## 2017-12-08 DIAGNOSIS — R32 Unspecified urinary incontinence: Principal | ICD-10-CM

## 2017-12-08 NOTE — Telephone Encounter (Signed)
  Follow up Call-  Call back number 12/05/2017  Post procedure Call Back phone  # [phone]  Permission to leave phone message Yes  Some recent data might be hidden     Patient questions:  Do you have a fever, pain , or abdominal swelling? No. Pain Score  0 *  Have you tolerated food without any problems? Yes.    Have you been able to return to your normal activities? Yes.    Do you have any questions about your discharge instructions: Diet   No. Medications  No. Follow up visit  No.  Do you have questions or concerns about your Care? No.  Actions: * If pain score is 4 or above: No action needed, pain <4.

## 2017-12-09 ENCOUNTER — Telehealth: Payer: Self-pay | Admitting: Internal Medicine

## 2017-12-09 NOTE — Telephone Encounter (Signed)
Attempted to reach patient. No answer.

## 2017-12-09 NOTE — Telephone Encounter (Signed)
Pt would like to speak with you regarding instructions about physical therapy.

## 2017-12-10 NOTE — Telephone Encounter (Signed)
Patient notified that she will be contacted directly be PT to set up an appt

## 2017-12-13 ENCOUNTER — Encounter: Payer: Self-pay | Admitting: Internal Medicine

## 2017-12-13 NOTE — Progress Notes (Signed)
No recall for 3 dim adenomas at age 77 My Chart letter

## 2017-12-14 HISTORY — PX: ESOPHAGOGASTRODUODENOSCOPY: SHX1529

## 2017-12-16 ENCOUNTER — Encounter: Payer: Self-pay | Admitting: Internal Medicine

## 2017-12-16 ENCOUNTER — Other Ambulatory Visit: Payer: Self-pay

## 2017-12-16 ENCOUNTER — Ambulatory Visit (AMBULATORY_SURGERY_CENTER): Payer: Self-pay

## 2017-12-16 VITALS — Ht 64.0 in | Wt 199.8 lb

## 2017-12-16 DIAGNOSIS — R1310 Dysphagia, unspecified: Secondary | ICD-10-CM

## 2017-12-16 NOTE — Progress Notes (Signed)
No egg or soy allergy known to patient  No issues with past sedation with any surgeries  or procedures, no intubation problems  No diet pills per patient No home 02 use per patient  No blood thinners per patient  Pt denies issues with constipation  No A fib or A flutter  EMMI video sent to pt's e mail , pt declined    

## 2017-12-19 ENCOUNTER — Encounter: Payer: Self-pay | Admitting: Internal Medicine

## 2017-12-19 ENCOUNTER — Ambulatory Visit (AMBULATORY_SURGERY_CENTER): Payer: Medicare Other | Admitting: Internal Medicine

## 2017-12-19 VITALS — BP 124/80 | HR 73 | Temp 97.3°F | Resp 13

## 2017-12-19 DIAGNOSIS — R1310 Dysphagia, unspecified: Secondary | ICD-10-CM | POA: Diagnosis present

## 2017-12-19 DIAGNOSIS — K449 Diaphragmatic hernia without obstruction or gangrene: Secondary | ICD-10-CM | POA: Diagnosis not present

## 2017-12-19 DIAGNOSIS — K222 Esophageal obstruction: Secondary | ICD-10-CM

## 2017-12-19 MED ORDER — SODIUM CHLORIDE 0.9 % IV SOLN: 500.0000 mL | Freq: Once | INTRAVENOUS | Status: DC

## 2017-12-19 MED ORDER — OMEPRAZOLE 40 MG PO CPDR: 40.0000 mg | DELAYED_RELEASE_CAPSULE | Freq: Every day | ORAL | 3 refills | Status: DC

## 2017-12-19 NOTE — Progress Notes (Signed)
Report to PACU, RN, vss, BBS= Clear.  

## 2017-12-19 NOTE — Op Note (Signed)
[facility] Patient Name: Faith Santiago Procedure Date: 12/19/2017 3:22 PM MRN: [id number] Endoscopist: Gatha Mayer , MD Age: 77 Referring MD:  Date of Birth: 01-12-1941 Gender: Female Account #: [id number] Procedure:                Upper GI endoscopy Indications:              Dysphagia, Stricture of the esophagus, For therapy                            of esophageal stricture Medicines:                Propofol per Anesthesia, Monitored Anesthesia Care Procedure:                Pre-Anesthesia Assessment:                           - Prior to the procedure, a History and Physical                            was performed, and patient medications and                            allergies were reviewed. The patient's tolerance of                            previous anesthesia was also reviewed. The risks                            and benefits of the procedure and the sedation                            options and risks were discussed with the patient.                            All questions were answered, and informed consent                            was obtained. Prior Anticoagulants: The patient has                            taken no previous anticoagulant or antiplatelet                            agents. ASA Grade Assessment: II - A patient with                            mild systemic disease. After reviewing the risks                            and benefits, the patient was deemed in                            satisfactory condition to undergo the procedure.  After obtaining informed consent, the endoscope was                            passed under direct vision. Throughout the                            procedure, the patient's blood pressure, pulse, and                            oxygen saturations were monitored continuously. The                            Endoscope was introduced through the mouth, and                            advanced  to the antrum of the stomach. The upper GI                            endoscopy was accomplished without difficulty. The                            patient tolerated the procedure well. Scope In: Scope Out: Findings:                 One benign-appearing, intrinsic moderate stenosis                            was found at the gastroesophageal junction. This                            stenosis measured 1.6 cm (inner diameter). The                            stenosis was traversed. A TTS dilator was passed                            through the scope. Dilation with an 18-19-20 mm                            balloon dilator was performed to 18 mm. The                            dilation site was examined and showed moderate                            mucosal disruption. Estimated blood loss was                            minimal.                           A 5 cm hiatal hernia was present.                           The exam was  otherwise without abnormality.                           The cardia and gastric fundus were OTHERWISE normal                            on retroflexion. Complications:            No immediate complications. Estimated Blood Loss:     Estimated blood loss was minimal. Impression:               - Benign-appearing esophageal stenosis. Dilated.                           - 5 cm hiatal hernia.                           - The examination was otherwise normal. EXAM TO                            ANTRUM                           - No specimens collected. Recommendation:           - Patient has a contact number available for                            emergencies. The signs and symptoms of potential                            delayed complications were discussed with the                            patient. Return to normal activities tomorrow.                            Written discharge instructions were provided to the                            patient.                            - Clear liquids x 1 hour then soft foods rest of                            day. Start prior diet tomorrow.                           - Continue present medications.                           - STAY ON OMEPRAZOLE LONG-TERM TO PREVENT RECURRENT                            STRICTURE FORMATION Gatha Mayer, MD 12/19/2017 3:48:54 PM This report has been signed electronically.

## 2017-12-19 NOTE — Progress Notes (Signed)
Pt's states no medical or surgical changes since previsit or office visit. 

## 2017-12-19 NOTE — Progress Notes (Signed)
Called to room to assist during endoscopic procedure.  Patient ID and intended procedure confirmed with present staff. Received instructions for my participation in the procedure from the performing physician.  

## 2017-12-19 NOTE — Patient Instructions (Addendum)
I dilated up to 18 mm - this usually takes care of it.  If you have swallowing problems still or again call us and I can set up another dilation.  Please continue taking omeprazole every day and do not stop or it is very likely you will get swallowing problems again. I sent a year supply 90 days x 4 to [facility]  I appreciate the opportunity to care for you. Gatha Mayer, MD, [facility]  Dilation diet handout given to patient. Hiatal hernia handout given to patient.  Continue present medications.  YOU HAD AN ENDOSCOPIC PROCEDURE TODAY AT [facility]:   Refer to the procedure report that was given to you for any specific questions about what was found during the examination.  If the procedure report does not answer your questions, please call your gastroenterologist to clarify.  If you requested that your care partner not be given the details of your procedure findings, then the procedure report has been included in a sealed envelope for you to review at your convenience later.  YOU SHOULD EXPECT: Some feelings of bloating in the abdomen. Passage of more gas than usual.  Walking can help get rid of the air that was put into your GI tract during the procedure and reduce the bloating. If you had a lower endoscopy (such as a colonoscopy or flexible sigmoidoscopy) you may notice spotting of blood in your stool or on the toilet paper. If you underwent a bowel prep for your procedure, you may not have a normal bowel movement for a few days.  Please Note:  You might notice some irritation and congestion in your nose or some drainage.  This is from the oxygen used during your procedure.  There is no need for concern and it should clear up in a day or so.  SYMPTOMS TO REPORT IMMEDIATELY:   Following upper endoscopy (EGD)  Vomiting of blood or coffee ground material  New chest pain or pain under the shoulder blades  Painful or persistently difficult swallowing  New shortness of  breath  Fever of 100F or higher  Black, tarry-looking stools  For urgent or emergent issues, a gastroenterologist can be reached at any hour by calling [phone].   DIET:  We do recommend a small meal at first, but then you may proceed to your regular diet.  Drink plenty of fluids but you should avoid alcoholic beverages for 24 hours.  ACTIVITY:  You should plan to take it easy for the rest of today and you should NOT DRIVE or use heavy machinery until tomorrow (because of the sedation medicines used during the test).    FOLLOW UP: Our staff will call the number listed on your records the next business day following your procedure to check on you and address any questions or concerns that you may have regarding the information given to you following your procedure. If we do not reach you, we will leave a message.  However, if you are feeling well and you are not experiencing any problems, there is no need to return our call.  We will assume that you have returned to your regular daily activities without incident.  If any biopsies were taken you will be contacted by phone or by letter within the next 1-3 weeks.  Please call us at [phone] if you have not heard about the biopsies in 3 weeks.    SIGNATURES/CONFIDENTIALITY: You and/or your care partner have signed paperwork which will  be entered into your electronic medical record.  These signatures attest to the fact that that the information above on your After Visit Summary has been reviewed and is understood.  Full responsibility of the confidentiality of this discharge information lies with you and/or your care-partner. 

## 2017-12-21 ENCOUNTER — Encounter: Payer: Self-pay | Admitting: Family Medicine

## 2017-12-22 ENCOUNTER — Telehealth: Payer: Self-pay | Admitting: *Deleted

## 2017-12-22 NOTE — Telephone Encounter (Signed)
  Follow up Call-  Call back number 12/19/2017 12/05/2017  Post procedure Call Back phone  # [phone]  Permission to leave phone message Yes Yes  Some recent data might be hidden     Patient questions:  Do you have a fever, pain , or abdominal swelling? No. Pain Score  0 *  Have you tolerated food without any problems? Yes.    Have you been able to return to your normal activities? Yes.    Do you have any questions about your discharge instructions: Diet   No. Medications  No. Follow up visit  No.  Do you have questions or concerns about your Care? No.  Actions: * If pain score is 4 or above: No action needed, pain <4.

## 2018-01-11 ENCOUNTER — Encounter: Payer: Self-pay | Admitting: Family Medicine

## 2018-01-22 ENCOUNTER — Other Ambulatory Visit: Payer: Self-pay

## 2018-01-22 ENCOUNTER — Encounter: Payer: Self-pay | Admitting: Physical Therapy

## 2018-01-22 ENCOUNTER — Ambulatory Visit: Payer: Medicare Other | Attending: Internal Medicine | Admitting: Physical Therapy

## 2018-01-22 DIAGNOSIS — R2689 Other abnormalities of gait and mobility: Secondary | ICD-10-CM | POA: Diagnosis present

## 2018-01-22 DIAGNOSIS — R278 Other lack of coordination: Secondary | ICD-10-CM | POA: Diagnosis present

## 2018-01-22 DIAGNOSIS — Z8542 Personal history of malignant neoplasm of other parts of uterus: Secondary | ICD-10-CM | POA: Insufficient documentation

## 2018-01-22 DIAGNOSIS — M791 Myalgia, unspecified site: Secondary | ICD-10-CM | POA: Diagnosis present

## 2018-01-22 DIAGNOSIS — M545 Low back pain: Secondary | ICD-10-CM | POA: Insufficient documentation

## 2018-01-22 DIAGNOSIS — Z9181 History of falling: Secondary | ICD-10-CM | POA: Insufficient documentation

## 2018-01-22 DIAGNOSIS — G8929 Other chronic pain: Secondary | ICD-10-CM | POA: Diagnosis present

## 2018-01-22 DIAGNOSIS — C55 Malignant neoplasm of uterus, part unspecified: Secondary | ICD-10-CM

## 2018-01-22 NOTE — Patient Instructions (Signed)
Increase 1 ( 16 floz) water to  2 per day   Body mechanics: 1)  feet on the ground when sitting , no crossed ankles  2)  Proper body mechanics with getting out of a chair to decrease strain  on back &pelvic floor   Avoid holding your breath when Getting out of the chair:  Scoot to front part of chair chair Heels behind feet, feet are hip width apart, nose over toes  Inhale like you are smelling roses Exhale to stand   3)  Avoid straining pelvic floor, abdominal muscles , spine  Use log rolling technique instead of getting out of bed with your neck or the sit-up   Log rolling into and out of .bed  With sidelying position first   ___________  Minisquat: Scoot buttocks back slight, hinge like you are looking at your reflection on a pond  Knees behind toes,  Inhale to "smell flowers"  Exhale on the rise "like rocket"  Do not lock knees, have more weight across ballmounds of feet, toes relaxed   10 reps x 3 x day   * this is the position you use when bending to reach low drawers ( not bending at your bend)

## 2018-01-23 NOTE — Therapy (Addendum)
[place] MAIN [facility] SERVICES [address] [place], [place], [address] Phone: [phone]   Fax:  [phone]  Physical Therapy Evaluation  Patient Details  Name: Faith Santiago MRN: [id number] Date of Birth: [birth year] Referring Provider (PT): Silvano Rusk   Encounter Date: 01/22/2018  PT End of Session - 01/23/18 1237    Visit Number  1    Number of Visits  12    Date for PT Re-Evaluation  04/16/18    Authorization Type  medicare     PT Start Time  0900    PT Stop Time  1010    PT Time Calculation (min)  70 min    Activity Tolerance  Patient tolerated treatment well    Behavior During Therapy  [facility] for tasks assessed/performed       Past Medical History:  Diagnosis Date  . Anemia, unspecified   . Anxiety   . Arthritis    hands R>L, neck and lower back  . Bimalleolar fracture of left ankle 11/19/2013   [place]  . Cancer ([place])   . Cataract   . Depression   . Esophageal reflux   . GERD with stricture 06/03/2007  . Glaucoma   . History of osteopenia    DEXA WNL 2008, 2015  . History of uterine cancer 1999   s/p hysterectomy  . HLD (hyperlipidemia)   . Narcolepsy   . Thyroid disease     Past Surgical History:  Procedure Laterality Date  . bilateral catarct removal  2010  . COLONOSCOPY  05/2003   WNL (Dr Velora Heckler)  . COLONOSCOPY  11/2017   3 small TA, diverticulisis, no f/u needed Carlean Purl)  . DEXA  03/2014   WNL T score +3.9  . ESOPHAGOGASTRODUODENOSCOPY  11/2017   dilated esoph stenosis, 8cm HH ([facility])  . ESOPHAGOGASTRODUODENOSCOPY  12/2017   repeat dilation of esoph stenosis, 5cm HH - rec stay on PPI Carlean Purl)  . ORIF ANKLE FRACTURE Left 11/19/2013   Procedure: LEFT ANKLE FRACTURE OPEN TREATMENT BIMALLEOLAR ANKLE INCLUDES INTERNAL FIXATION ;  Surgeon: Johnny Bridge, MD  . RETINAL DETACHMENT REPAIR W/ SCLERAL BUCKLE LE Right 03/21/2016  . TOTAL ABDOMINAL HYSTERECTOMY  1999   uterine cancer    There were no vitals  filed for this visit.   Subjective Assessment - 01/22/18 0921    Subjective  1) urinary and fecal urge incontinence: Around Christmas time 2018, pt was not able to eat solid food because it got caught in her esophagus and then she would have projectile vomitting.  Pt has also diarrhea and "gushes" of urine when she had to urinate and had leakage before getting to the bathroom. Pt also leaked stools before getting to the bathroom. After she having a surgery to enlarge a stricture at her lower esophagus this past month, her diarrhea got better. Pt is having stool consistency Type 4 now once every other day and takes Benefiber.  Pt is able to have normal food now after her surgery. Daily fluid intake: 1 ( 16 fl oz) water, 1 glass of wine or liquor. No coffee, sodas, teas. Urinary leakage has improved by 50% since her surgery as well. Fecal leakage has improved by 75%.  Nocturia 1x/ night. Pt lives with husband.    2) non-radiating CLBP for the past 10 years. ( pt points to low back B) Pt did alot of walking on concrete floors when she worked. Pt has been retired for the past 10 years. Pain comes  after 15 min with walking, standing, or dish washing. Pain level 7/10. Seating and a cream relieves the pain. Denied knee, ankle, feet issues.     3) Recent fall 3 weeks ago, Pt had a fall within the past 6 months, fell onto her L hip, had difficulty getting up off the floor by herself. Pt has had more falls over the past years.         Pertinent History  gynecology Hx: 2 vaginal births with "severe" perineal tear with her first child. Abdominal hysterectomy and radiation 2/2 uterine cancer in 1999. ORIF in L ankle from fall 2014     Patient Stated Goals  stand after a fall, improve bladder and bowel leakage, decrease falls          [facility] PT Assessment - 01/23/18 1237      Assessment   Medical Diagnosis  urinary and fecal incontinence     Referring Provider (PT)  Silvano Rusk      Precautions    Precautions  None      Restrictions   Weight Bearing Restrictions  No      Balance Screen   Has the patient fallen in the past 6 months  Yes   fell onto her L hip, had difficulty getting up from floor   How many times?  1    Has the patient had a decrease in activity level because of a fear of falling?   No    Is the patient reluctant to leave their home because of a fear of falling?   No      Prior Function   Level of Independence  Independent      Sit to Stand   Comments  genu valgus       AROM   Overall AROM Comments  trunk rotation 25% B        Strength   Overall Strength  --   hip flexB4/5, knee flexion 3+/5R, 3-/5 L, knee ext 4/4 B    Overall Strength Comments  standing PF L 5 reps ( limited due to ORIF in ankle), 10 reps on R   ( one hand on wall)    hip ext/ abd to assess next session     Palpation   Spinal mobility  thoracic mobility hypomobile, increased parapsional thoracic and lumbar, dowager's hump      Palpation comment  restricted low abdominal scar  scar                 Objective measurements completed on examination: See above findings.      [place] [facility] PT Treatment/Exercise - 01/23/18 1237      Bed Mobility   Bed Mobility  --   breathholding, long sit > supine, crunch to sit up      Transfers   Five time sit to stand comments   19.6 sec , no hands on chair, genu valgus       Ambulation/Gait   Gait Comments  0.84 m/s slowed gait. L genu valgus > R, limited hip flexion, push up       Posture/Postural Control   Posture/Postural Control  --   limited diaphragmatic excursion. ab straining w. cue for BM,   Posture Comments  pelvic floor lift with cue for contraction.      Neuro Re-ed    Neuro Re-ed Details   see pt instructions  PT Long Term Goals - 01/22/18 0947      PT LONG TERM GOAL #1   Title  Five Times Sit to Stand   decrease to 16.6 sec with no hands on chair and no genu valgus in order to minimize  risk for falls     Baseline   19.6 sec , no hands on chair, genu valgus     Time  8    Period  Weeks    Status  New    Target Date  03/20/18      PT LONG TERM GOAL #2   Title  Pt will decrease ODI % from 24% to < 12% in order to return to ADLs    Time  12    Period  Weeks    Status  New    Target Date  04/17/18      PT LONG TERM GOAL #3   Title  Pt will increase her knee flexion strength to 4/5 B and plantarflexion to 10 reps in order to improve gait     Time  8    Period  Weeks    Status  New    Target Date  03/20/18      PT LONG TERM GOAL #4   Title  Pt will demo proper deep core coordination with optimal diaphragmatic breathing and pelvic floor mobility in order to minimize urinary and fecal incontinence    Time  4    Period  Weeks    Status  New    Target Date  02/20/18      PT LONG TERM GOAL #5   Title  Pt will increase her gait speed from 0.8 m/s to > 1.0 m/s in order to ambulate safely     Time  8    Period  Weeks    Status  New    Target Date  03/20/18      Additional Long Term Goals   Additional Long Term Goals  Yes      PT LONG TERM GOAL #6   Title  Pt will be referred to falls prevention PT after d/c in order to minimize fall risks     Time  12    Period  Weeks    Status  New    Target Date  04/17/18             Plan - 01/23/18 1238    Clinical Impression Statement  Pt is a 77 yo female who reports of urinary and fecal incontinence, non radiating CLBP, and repeated falls.  These deficits impact her ADLs and QOL. Pt has contributing factors including 2 vaginal births with "severe" perineal tear with her first child, abdominal hysterectomy and radiation 2/2 uterine cancer in 1999, ORIF in L ankle from a fall 2014.  Pt's clinical presentation include severe abdominal scar, hip and lower extremity weakness, spinal hypomobility, gait deviations, and slowed gait speed.    Clinical Presentation  Evolving    Clinical Decision Making  Moderate    Rehab  Potential  Good    PT Frequency  1x / week    PT Duration  12 weeks    PT Treatment/Interventions  Moist Heat;Stair training;Therapeutic activities;Therapeutic exercise;Balance training;Neuromuscular re-education;Patient/family education;Manual lymph drainage;Passive range of motion;Energy conservation;Aquatic Therapy;Biofeedback;Scar mobilization;Manual techniques;Functional mobility training    Consulted and Agree with Plan of Care  Patient       Patient will benefit from skilled therapeutic intervention in order to improve the following deficits  and impairments:  Decreased safety awareness, Decreased endurance, Decreased activity tolerance, Decreased balance, Decreased scar mobility, Hypomobility, Impaired flexibility, Improper body mechanics, Decreased mobility, Decreased strength, Postural dysfunction, Pain, Increased muscle spasms, Decreased range of motion, Decreased coordination, Increased fascial restricitons, Increased edema, Difficulty walking, Abnormal gait  Visit Diagnosis: Other abnormalities of gait and mobility  Myalgia  Other lack of coordination  At high risk for falls  Chronic bilateral low back pain without sciatica     Problem List Patient Active Problem List   Diagnosis Date Noted  . Uterine cancer ([place]) 01/22/2018  . Prediabetes 10/14/2017  . Hypothyroidism 04/07/2015  . Vitamin D deficiency 04/02/2015  . Macrocytosis 03/28/2015  . Other fatigue 11/25/2014  . Advanced care planning/counseling discussion 03/15/2014  . Health maintenance examination 03/15/2014  . Postsurgical menopause 08/07/2011  . Medicare annual wellness visit, subsequent 08/06/2011  . Osteoarthritis 06/07/2009  . Narcolepsy without cataplexy 05/31/2008  . HLD (hyperlipidemia) 06/03/2007  . MDD (major depressive disorder), recurrent episode, severe ([place]) 06/03/2007  . GERD with stricture 06/03/2007    Jerl Mina ,PT, DPT, E-RYT  01/23/2018, 12:50 PM  [place] MAIN [facility] SERVICES [address] [place], [place], [address] Phone: [phone]   Fax:  [phone]  Name: NESHA COUNIHAN MRN: [id number] Date of Birth: February 07, 1941

## 2018-01-29 ENCOUNTER — Ambulatory Visit: Payer: Medicare Other | Admitting: Physical Therapy

## 2018-01-29 DIAGNOSIS — G8929 Other chronic pain: Secondary | ICD-10-CM

## 2018-01-29 DIAGNOSIS — R278 Other lack of coordination: Secondary | ICD-10-CM

## 2018-01-29 DIAGNOSIS — M545 Low back pain: Secondary | ICD-10-CM

## 2018-01-29 DIAGNOSIS — R2689 Other abnormalities of gait and mobility: Secondary | ICD-10-CM | POA: Diagnosis not present

## 2018-01-29 DIAGNOSIS — Z9181 History of falling: Secondary | ICD-10-CM

## 2018-01-29 DIAGNOSIS — M791 Myalgia, unspecified site: Secondary | ICD-10-CM

## 2018-01-29 NOTE — Patient Instructions (Signed)
  To increased flexibility in midback: Open book ( 15 reps) each side  Pillow between knees/ and behind back   Improve deep core strength On your back:  Deep core level 1 ( 10 breaths for breathing)    Deep core level 2 ( knee out) 6 min       To increased flexibility in midback: Seated cat-cow ( 15 reps)

## 2018-01-29 NOTE — Therapy (Signed)
[place] MAIN [facility] SERVICES [address] [place], [place], [address] Phone: [phone]   Fax:  [phone]  Physical Therapy Treatment  Patient Details  Name: Faith Santiago MRN: [id number] Date of Birth: July 17, 1940 Referring Provider (PT): Silvano Rusk   Encounter Date: 01/29/2018  PT End of Session - 01/29/18 1047    Visit Number  2    Number of Visits  12    Date for PT Re-Evaluation  04/16/18    Authorization Type  medicare     PT Start Time  1003    PT Stop Time  1050    PT Time Calculation (min)  47 min    Activity Tolerance  Patient tolerated treatment well    Behavior During Therapy  [facility] for tasks assessed/performed       Past Medical History:  Diagnosis Date  . Anemia, unspecified   . Anxiety   . Arthritis    hands R>L, neck and lower back  . Bimalleolar fracture of left ankle 11/19/2013   [place]  . Cancer ([place])   . Cataract   . Depression   . Esophageal reflux   . GERD with stricture 06/03/2007  . Glaucoma   . History of osteopenia    DEXA WNL 2008, 2015  . History of uterine cancer 1999   s/p hysterectomy  . HLD (hyperlipidemia)   . Narcolepsy   . Thyroid disease     Past Surgical History:  Procedure Laterality Date  . bilateral catarct removal  2010  . COLONOSCOPY  05/2003   WNL (Dr Velora Heckler)  . COLONOSCOPY  11/2017   3 small TA, diverticulisis, no f/u needed Carlean Purl)  . DEXA  03/2014   WNL T score +3.9  . ESOPHAGOGASTRODUODENOSCOPY  11/2017   dilated esoph stenosis, 8cm HH (Gessner)  . ESOPHAGOGASTRODUODENOSCOPY  12/2017   repeat dilation of esoph stenosis, 5cm HH - rec stay on PPI Carlean Purl)  . ORIF ANKLE FRACTURE Left 11/19/2013   Procedure: LEFT ANKLE FRACTURE OPEN TREATMENT BIMALLEOLAR ANKLE INCLUDES INTERNAL FIXATION ;  Surgeon: Johnny Bridge, MD  . RETINAL DETACHMENT REPAIR W/ SCLERAL BUCKLE LE Right 03/21/2016  . TOTAL ABDOMINAL HYSTERECTOMY  1999   uterine cancer    There were no vitals  filed for this visit.  Subjective Assessment - 01/29/18 1052    Subjective  Pt reported she has been practicing log rolling, sit to stand.     Pertinent History  gynecology Hx: 2 vaginal births with "severe" perineal tear with her first child. Abdominal hysterectomy and radiation 2/2 uterine cancer in 1999. ORIF in L ankle from fall 2014     Patient Stated Goals  stand after a fall, improve bladder and bowel leakage, decrease falls          [facility] PT Assessment - 01/29/18 1049      Palpation   Spinal mobility  thoracic mobility hypomobile, increased parapsional thoracic and lumbar, dowager's hump      Palpation comment  restricted low abdominal scar  scar                    [facility] Adult PT Treatment/Exercise - 01/29/18 1049      Neuro Re-ed    Neuro Re-ed Details   see pt instructions      Modalities   Modalities  Moist Heat      Moist Heat Therapy   Number Minutes Moist Heat  --   thoracic/lumbar  Manual Therapy   Manual therapy comments  STM/ MWM along inter/paraspinals at thoracic/ posterior ribs, QL, fascial restrictions releases over abdominal scar              PT Education - 01/29/18 1047    Education Details  HEP    Person(s) Educated  Patient    Methods  Explanation;Demonstration;Tactile cues;Verbal cues;Handout    Comprehension  Returned demonstration;Verbalized understanding          PT Long Term Goals - 01/22/18 0947      PT LONG TERM GOAL #1   Title  Five Times Sit to Stand   decrease to 16.6 sec with no hands on chair and no genu valgus in order to minimize risk for falls     Baseline   19.6 sec , no hands on chair, genu valgus     Time  8    Period  Weeks    Status  New    Target Date  03/20/18      PT LONG TERM GOAL #2   Title  Pt will decrease ODI % from 24% to < 12% in order to return to ADLs    Time  12    Period  Weeks    Status  New    Target Date  04/17/18      PT LONG TERM GOAL #3   Title  Pt will increase her  knee flexion strength to 4/5 B and plantarflexion to 10 reps in order to improve gait     Time  8    Period  Weeks    Status  New    Target Date  03/20/18      PT LONG TERM GOAL #4   Title  Pt will demo proper deep core coordination with optimal diaphragmatic breathing and pelvic floor mobility in order to minimize urinary and fecal incontinence    Time  4    Period  Weeks    Status  New    Target Date  02/20/18      PT LONG TERM GOAL #5   Title  Pt will increase her gait speed from 0.8 m/s to > 1.0 m/s in order to ambulate safely     Time  8    Period  Weeks    Status  New    Target Date  03/20/18      Additional Long Term Goals   Additional Long Term Goals  Yes      PT LONG TERM GOAL #6   Title  Pt will be referred to falls prevention PT after d/c in order to minimize fall risks     Time  12    Period  Weeks    Status  New    Target Date  04/17/18            Plan - 01/29/18 1048    Clinical Impression Statement  Pt demo'd increased trunk rotation, decreased thoracic mm tightness, decreased abdominal scar restrictions post Tx. Progressed pt to exercises to improve thoracic extension, diaphragmatic excursion, and deep core strength. Pt continues to benefit from skilled PT    Rehab Potential  Good    PT Frequency  1x / week    PT Duration  12 weeks    PT Treatment/Interventions  Moist Heat;Stair training;Therapeutic activities;Therapeutic exercise;Balance training;Neuromuscular re-education;Patient/family education;Manual lymph drainage;Passive range of motion;Energy conservation;Aquatic Therapy;Biofeedback;Scar mobilization;Manual techniques;Functional mobility training    Consulted and Agree with Plan of Care  Patient  Patient will benefit from skilled therapeutic intervention in order to improve the following deficits and impairments:  Decreased safety awareness, Decreased endurance, Decreased activity tolerance, Decreased balance, Decreased scar mobility,  Hypomobility, Impaired flexibility, Improper body mechanics, Decreased mobility, Decreased strength, Postural dysfunction, Pain, Increased muscle spasms, Decreased range of motion, Decreased coordination, Increased fascial restricitons, Increased edema, Difficulty walking, Abnormal gait  Visit Diagnosis: Other abnormalities of gait and mobility  Myalgia  Other lack of coordination  At high risk for falls  Chronic bilateral low back pain without sciatica     Problem List Patient Active Problem List   Diagnosis Date Noted  . Uterine cancer ([place]) 01/22/2018  . Prediabetes 10/14/2017  . Hypothyroidism 04/07/2015  . Vitamin D deficiency 04/02/2015  . Macrocytosis 03/28/2015  . Other fatigue 11/25/2014  . Advanced care planning/counseling discussion 03/15/2014  . Health maintenance examination 03/15/2014  . Postsurgical menopause 08/07/2011  . Medicare annual wellness visit, subsequent 08/06/2011  . Osteoarthritis 06/07/2009  . Narcolepsy without cataplexy 05/31/2008  . HLD (hyperlipidemia) 06/03/2007  . MDD (major depressive disorder), recurrent episode, severe ([place]) 06/03/2007  . GERD with stricture 06/03/2007    Jerl Mina ,PT, DPT, E-RYT  01/29/2018, 10:52 AM  [place] MAIN [facility] SERVICES [address] [place], [place], [address] Phone: [phone]   Fax:  [phone]  Name: Faith Santiago MRN: [id number] Date of Birth: [birth year]

## 2018-02-04 ENCOUNTER — Encounter: Payer: Self-pay | Admitting: Physical Therapy

## 2018-02-04 NOTE — Addendum Note (Signed)
Addended by: Jerl Mina on: 02/04/2018 11:50 PM   Modules accepted: Orders

## 2018-02-12 ENCOUNTER — Other Ambulatory Visit: Payer: Self-pay | Admitting: Family Medicine

## 2018-02-13 NOTE — Telephone Encounter (Signed)
Refill levothyroxine 50 mcg to [facility] # [facility] x 1; last annual 11/28/17.

## 2018-02-16 ENCOUNTER — Encounter: Payer: Self-pay | Admitting: Psychiatry

## 2018-02-16 ENCOUNTER — Ambulatory Visit: Payer: Medicare Other | Admitting: Psychiatry

## 2018-02-16 DIAGNOSIS — F331 Major depressive disorder, recurrent, moderate: Secondary | ICD-10-CM | POA: Diagnosis not present

## 2018-02-16 NOTE — Progress Notes (Signed)
Psychiatric MD Progress Note   Patient Identification: Faith Santiago MRN:  [id number] Date of Evaluation:  02/16/2018 Referral Source: PCP Chief Complaint:    Visit Diagnosis:    ICD-10-CM   1. MDD (major depressive disorder), recurrent episode, moderate (HCC) F33.1    Diagnosis:   Patient Active Problem List   Diagnosis Date Noted  . Uterine cancer ([place]) [C55] 01/22/2018  . Prediabetes [R73.03] 10/14/2017  . Hypothyroidism [E03.9] 04/07/2015  . Vitamin D deficiency [E55.9] 04/02/2015  . Macrocytosis [D75.89] 03/28/2015  . Other fatigue [R53.83] 11/25/2014  . Advanced care planning/counseling discussion [Z71.89] 03/15/2014  . Health maintenance examination [Z00.00] 03/15/2014  . Postsurgical menopause [E89.40] 08/07/2011  . Medicare annual wellness visit, subsequent [Z00.00] 08/06/2011  . Osteoarthritis [M19.90] 06/07/2009  . Narcolepsy without cataplexy [G47.419] 05/31/2008  . HLD (hyperlipidemia) [E78.5] 06/03/2007  . MDD (major depressive disorder), recurrent episode, severe ([place]) [F33.2] 06/03/2007  . GERD with stricture [K21.9, K22.2] 06/03/2007   History of Present Illness:   Patient is a 77 year old married female who presented for follow up.  She reported that she had several trips since her last appointment.  She went to visit her family in the [place] and also just returned last night from [place].  She reported that she enjoyed her trips and has been doing well.  She reported that she has been compliant with her medication and takes Xanax only on a as needed basis.  Patient reported that her husband has been driving her and she enjoys her trips with her husband.  She feels that the current combination of medication has been helpful mood symptoms are improving.  She does not feel depressed or hopeless.  She takes Xanax only 1-2 times per week and does not have any side effect.  It is able to contract for safety.  She has enough medications at home.  She denied having  any suicidal homicidal ideations or plans.  She is able to contract for safety at this time.       She gets her medication from the mail order pharmacy.         Elements:  Severity:  moderate. Associated Signs/Symptoms: Depression Symptoms:  fatigue, anxiety, loss of energy/fatigue, (Hypo) Manic Symptoms:  none Anxiety Symptoms:  Excessive Worry, Psychotic Symptoms:  none PTSD Symptoms: Negative NA  Past Medical History:  Past Medical History:  Diagnosis Date  . Anemia, unspecified   . Anxiety   . Arthritis    hands R>L, neck and lower back  . Bimalleolar fracture of left ankle 11/19/2013   [place]  . Cancer ([place])   . Cataract   . Depression   . Esophageal reflux   . GERD with stricture 06/03/2007  . Glaucoma   . History of osteopenia    DEXA WNL 2008, 2015  . History of uterine cancer 1999   s/p hysterectomy  . HLD (hyperlipidemia)   . Narcolepsy   . Thyroid disease     Past Surgical History:  Procedure Laterality Date  . bilateral catarct removal  2010  . COLONOSCOPY  05/2003   WNL (Dr Velora Heckler)  . COLONOSCOPY  11/2017   3 small TA, diverticulisis, no f/u needed Carlean Purl)  . DEXA  03/2014   WNL T score +3.9  . ESOPHAGOGASTRODUODENOSCOPY  11/2017   dilated esoph stenosis, 8cm HH ([facility])  . ESOPHAGOGASTRODUODENOSCOPY  12/2017   repeat dilation of esoph stenosis, 5cm HH - rec stay on PPI Carlean Purl)  . ORIF ANKLE FRACTURE Left 11/19/2013  Procedure: LEFT ANKLE FRACTURE OPEN TREATMENT BIMALLEOLAR ANKLE INCLUDES INTERNAL FIXATION ;  Surgeon: Johnny Bridge, MD  . RETINAL DETACHMENT REPAIR W/ SCLERAL BUCKLE LE Right 03/21/2016  . TOTAL ABDOMINAL HYSTERECTOMY  1999   uterine cancer   Family History:  Family History  Problem Relation Age of Onset  . Hypothyroidism Mother   . Stroke Mother        ministroke  . Coronary artery disease Mother 41       s/p some heart surgery  . Prostate cancer Father   . Diabetes Neg Hx   . Colon cancer Neg Hx   . Esophageal  cancer Neg Hx   . Pancreatic cancer Neg Hx   . Stomach cancer Neg Hx   . Liver disease Neg Hx   . Rectal cancer Neg Hx    Social History:   Social History   Socioeconomic History  . Marital status: Married    Spouse name: Not on file  . Number of children: Not on file  . Years of education: Not on file  . Highest education level: Not on file  Occupational History  . Occupation: retired-occupational therapist  Social Needs  . Financial resource strain: Not on file  . Food insecurity:    Worry: Not on file    Inability: Not on file  . Transportation needs:    Medical: Not on file    Non-medical: Not on file  Tobacco Use  . Smoking status: Never Smoker  . Smokeless tobacco: Never Used  Substance and Sexual Activity  . Alcohol use: Yes    Alcohol/week: 7.0 - 12.0 standard drinks    Types: 7 - 10 Glasses of wine per week    Comment: 1 drink /day  . Drug use: No  . Sexual activity: Never  Lifestyle  . Physical activity:    Days per week: Not on file    Minutes per session: Not on file  . Stress: Not on file  Relationships  . Social connections:    Talks on phone: Not on file    Gets together: Not on file    Attends religious service: Not on file    Active member of club or organization: Not on file    Attends meetings of clubs or organizations: Not on file    Relationship status: Not on file  Other Topics Concern  . Not on file  Social History Narrative   caffeine: none   Lives with husband, has 2 grown children, 1 cat   Occupation: retired, was OT for [employer]   Activity: no regular exercise, to start silver sneakers   Diet: overeating, good amt water, vegetables daily, occasional fruits   Never smoker   EtOH 1/day   Additional Social History:  Married x 50 years. Has a son and daughter.  She enjoys quilting and belong to a [place] group. 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 She moved to [facility] and worked at [place] in [place].  Moved to [place] in 1976. Worked in  [employer] and [employer] x 26 years and retired from there.   Musculoskeletal: Strength & Muscle Tone: within normal limits Gait & Station: normal Patient leans: N/A  Psychiatric Specialty Exam: Medication Refill   Depression          Review of Systems  Psychiatric/Behavioral: Positive for depression.    There were no vitals taken for this visit.There is no height or weight on file to calculate BMI.  General  Appearance: Casual and Fairly Groomed  Eye Contact:  Fair  Speech:  Clear and Coherent and Slow  Volume:  Normal  Mood:  Depressed  Affect:  Congruent and Depressed  Thought Process:  Goal Directed  Orientation:  Full (Time, Place, and Person)  Thought Content:  WDL  Suicidal Thoughts:  No  Homicidal Thoughts:  No  Memory:  Immediate;   Fair  Judgement:  Fair  Insight:  Fair  Psychomotor Activity:  Normal  Concentration:  Fair  Recall:  [employer] of Knowledge:Fair  Language: Fair  Akathisia:  No  Handed:  Right  AIMS (if indicated):    Assets:  Communication Skills Desire for Improvement Physical Health Social Support  ADL's:  Intact  Cognition: WNL  Sleep:     Is the patient at risk to self?  No. Has the patient been a risk to self in the past 6 months?  No. Has the patient been a risk to self within the distant past?  No. Is the patient a risk to others?  No. Has the patient been a risk to others in the past 6 months?  No. Has the patient been a risk to others within the distant past?  No.  Allergies:   Allergies  Allergen Reactions  . Ciprofloxacin     REACTION: sun induced rash   Current Medications: Current Outpatient Medications  Medication Sig Dispense Refill  . ALPRAZolam (XANAX) 0.25 MG tablet Take 1 tablet (0.25 mg total) by mouth at bedtime as needed for anxiety. 30 tablet 2  . ARIPiprazole (ABILIFY) 10 MG tablet Take 1 tablet (10 mg total) by mouth daily. 90 tablet 3  . Cholecalciferol (VITAMIN D3) 1000 units CAPS Take  1 capsule (1,000 Units total) by mouth daily. 30 capsule   . levothyroxine (SYNTHROID, LEVOTHROID) 50 MCG tablet TAKE 1 TABLET (50 MCG TOTAL) BY MOUTH DAILY. 90 tablet 1  . omeprazole (PRILOSEC) 40 MG capsule Take 1 capsule (40 mg total) by mouth daily before breakfast. 90 capsule 3  . venlafaxine XR (EFFEXOR-XR) 150 MG 24 hr capsule Take 1 capsule (150 mg total) by mouth daily with breakfast. 90 capsule 1   No current facility-administered medications for this visit.     Previous Psychotropic Medications:  Prozac- worked well.  Wellbutrin Alprazolam- many years Celexa - no change   Substance Abuse History in the last 12 months:  No.  Consequences of Substance Abuse: Negative NA  Medical Decision Making:  Review of Psycho-Social Stressors (1)  Treatment Plan Summary: Medication management   Discussed with patient or the medications treatment risks benefits and alternatives Continue  Abilify 10 mg to help as an add-on to her antidepressant medications Continue  Effexor XR  150 mg and patient will take 1 pills in the morning.  Advised her to start taking alprazolam 0.25 mg  on a when necessary basis.  We will fax her prescription of alprazolam to the mail order pharmacy.  Follow-up in 3 months or earlier depending on her symptoms.  She has enough supply of the medications and medications are not refilled at this time.     More than 50% of the time spent in psychoeducation, counseling and coordination of care.    This note was generated in part or whole with voice recognition software. Voice regonition is usually quite accurate but there are transcription errors that can and very often do occur. I apologize for any typographical errors that were not detected and corrected.  Rainey Pines, MD  11/4/201912:44 PM

## 2018-02-17 ENCOUNTER — Ambulatory Visit: Payer: Self-pay | Admitting: Psychiatry

## 2018-02-19 ENCOUNTER — Ambulatory Visit: Payer: Medicare Other | Attending: Internal Medicine | Admitting: Physical Therapy

## 2018-02-19 DIAGNOSIS — M545 Low back pain, unspecified: Secondary | ICD-10-CM

## 2018-02-19 DIAGNOSIS — G8929 Other chronic pain: Secondary | ICD-10-CM

## 2018-02-19 DIAGNOSIS — M791 Myalgia, unspecified site: Secondary | ICD-10-CM | POA: Diagnosis present

## 2018-02-19 DIAGNOSIS — R278 Other lack of coordination: Secondary | ICD-10-CM

## 2018-02-19 DIAGNOSIS — Z9181 History of falling: Secondary | ICD-10-CM | POA: Diagnosis present

## 2018-02-19 DIAGNOSIS — R2689 Other abnormalities of gait and mobility: Secondary | ICD-10-CM

## 2018-02-19 NOTE — Patient Instructions (Signed)
PELVIC FLOOR / KEGEL EXERCISES   Pelvic floor/ Kegel exercises are used to strengthen the muscles in the base of your pelvis that are responsible for supporting your pelvic organs and preventing urine/feces leakage. Based on your therapist's recommendations, they can be performed while standing, sitting, or lying down. Imagine pelvic floor area as a diamond with pelvic landmarks: top =pubic bone, bottom tip=tailbone, sides=sitting bones (ischial tuberosities).    Make yourself aware of this muscle group by using these cues while coordinating your breath:  Inhale, feel pelvic floor diamond area lower like hammock towards your feet and ribcage/belly expanding. Pause. Let the exhale naturally and feel your belly sink, abdominal muscles hugging in around you and you may notice the pelvic diamond draws upward towards your head forming a umbrella shape. Give a squeeze during the exhalation like you are stopping the flow of urine. If you are squeezing the buttock muscles, try to give 50% less effort.   Common Errors:  Breath holding: If you are holding your breath, you may be bearing down against your bladder instead of pulling it up. If you belly bulges up while you are squeezing, you are holding your breath. Be sure to breathe gently in and out while exercising. Counting out loud may help you avoid holding your breath.  Accessory muscle use: You should not see or feel other muscle movement when performing pelvic floor exercises. When done properly, no one can tell that you are performing the exercises. Keep the buttocks, belly and inner thighs relaxed.  Overdoing it: Your muscles can fatigue and stop working for you if you over-exercise. You may actually leak more or feel soreness at the lower abdomen or rectum.  YOUR HOME EXERCISE PROGRAM  LONG HOLDS:   Position: on back or reclined in car seat ( do not lift head to sit up, instead make sure to use the handle to raise the car seat up and keep  spine/head relaxed to not place load on pelvic floor/ abdominal muscle)     Inhale and then exhale. Then squeeze the muscle and count aloud for 5 seconds. Rest with three long breaths. (Be sure to let belly sink in with exhales and not push outward)  Perform 2 repetitions, 5 times/day                        DECREASE DOWNWARD PRESSURE ON  YOUR PELVIC FLOOR, ABDOMINAL, LOW BACK MUSCLES       PRESERVE YOUR PELVIC HEALTH LONG-TERM   ** SQUEEZE pelvic floor BEFORE YOUR SNEEZE, COUGH, LAUGH   ** EXHALE BEFORE YOU RISE AGAINST GRAVITY (lifting, sit to stand, from squat to stand)   ** LOG ROLL OUT OF BED INSTEAD OF CRUNCH/SIT-UP   _______   red band: thigh opens up  20 reps  To strengthen hips ( knees stay hip width apart )

## 2018-02-20 NOTE — Therapy (Addendum)
[place] MAIN [facility] SERVICES [address] [place], [place], [address] Phone: [phone]   Fax:  [phone]  Physical Therapy Treatment  Patient Details  Name: Faith Santiago MRN: [id number] Date of Birth: 1941/01/13 Referring Provider (PT): Silvano Rusk   Encounter Date: 02/19/2018  PT End of Session - 02/20/18 1547    Visit Number  3    Number of Visits  12    Date for PT Re-Evaluation  04/16/18    Authorization Type  medicare     PT Start Time  1000    PT Stop Time  1058    PT Time Calculation (min)  58 min    Activity Tolerance  Patient tolerated treatment well    Behavior During Therapy  [facility] for tasks assessed/performed       Past Medical History:  Diagnosis Date  . Anemia, unspecified   . Anxiety   . Arthritis    hands R>L, neck and lower back  . Bimalleolar fracture of left ankle 11/19/2013   [place]  . Cancer ([place])   . Cataract   . Depression   . Esophageal reflux   . GERD with stricture 06/03/2007  . Glaucoma   . History of osteopenia    DEXA WNL 2008, 2015  . History of uterine cancer 1999   s/p hysterectomy  . HLD (hyperlipidemia)   . Narcolepsy   . Thyroid disease     Past Surgical History:  Procedure Laterality Date  . bilateral catarct removal  2010  . COLONOSCOPY  05/2003   WNL (Dr Velora Heckler)  . COLONOSCOPY  11/2017   3 small TA, diverticulisis, no f/u needed Carlean Purl)  . DEXA  03/2014   WNL T score +3.9  . ESOPHAGOGASTRODUODENOSCOPY  11/2017   dilated esoph stenosis, 8cm HH (Gessner)  . ESOPHAGOGASTRODUODENOSCOPY  12/2017   repeat dilation of esoph stenosis, 5cm HH - rec stay on PPI Carlean Purl)  . ORIF ANKLE FRACTURE Left 11/19/2013   Procedure: LEFT ANKLE FRACTURE OPEN TREATMENT BIMALLEOLAR ANKLE INCLUDES INTERNAL FIXATION ;  Surgeon: Johnny Bridge, MD  . RETINAL DETACHMENT REPAIR W/ SCLERAL BUCKLE LE Right 03/21/2016  . TOTAL ABDOMINAL HYSTERECTOMY  1999   uterine cancer    There were no vitals  filed for this visit.  Subjective Assessment - 02/19/18 1010    Subjective  Pt has been practicing walking with her feet off the ground more. Pt has had some urinary and fecal leakage but not to the extent to where it covers the whole underwear. Pt states her leakage is 80% improvements     Pertinent History  gynecology Hx: 2 vaginal births with "severe" perineal tear with her first child. Abdominal hysterectomy and radiation 2/2 uterine cancer in 1999. ORIF in L ankle from fall 2014     Patient Stated Goals  stand after a fall, improve bladder and bowel leakage, decrease falls          [facility] PT Assessment - 02/19/18 1017      Transfers   Five time sit to stand comments   16.83 m/s, no hands, genu valgus        Ambulation/Gait   Gait Comments  1.0 m/s increased hip flexion without cues                 Pelvic Floor Special Questions - 02/19/18 1012    Pelvic Floor Internal Exam  pt consented verbally without contraindications     Exam Type  Vaginal  Palpation  increased perineal scar 4-9 o'clock          Tx:  Pt consented verbally without contraindications  STM/MWM at perineal scar.            PT Long Term Goals - 02/19/18 1013      PT LONG TERM GOAL #1   Title  Five Times Sit to Stand   decrease to 16.6 sec with no hands on chair and no genu valgus in order to minimize risk for falls     Baseline   19.6 sec , no hands on chair, genu valgus  ( 11/7: 16.83 s)     Time  8    Period  Weeks    Status  Achieved      PT LONG TERM GOAL #2   Title  Pt will decrease ODI % from 24% to < 12% in order to return to ADLs    Time  12    Period  Weeks    Status  On-going      PT LONG TERM GOAL #3   Title  Pt will increase her knee flexion strength to 4/5 B and plantarflexion to 10 reps in order to improve gait     Time  8    Period  Weeks    Status  On-going      PT LONG TERM GOAL #4   Title  Pt will demo proper deep core coordination with optimal  diaphragmatic breathing and pelvic floor mobility in order to minimize urinary and fecal incontinence    Time  4    Period  Weeks    Status  On-going      PT LONG TERM GOAL #5   Title  Pt will increase her gait speed from 0.8 m/s to > 1.0 m/s in order to ambulate safely  ( 02/19/18: 1.33m/s)     Time  8    Period  Weeks    Status  Achieved      Additional Long Term Goals   Additional Long Term Goals  Yes      PT LONG TERM GOAL #6   Title  Pt will be referred to falls prevention PT after d/c in order to minimize fall risks     Time  12    Period  Weeks    Status  New      PT LONG TERM GOAL #7   Title  Pt will demo proper pelvic floor coordination Grade 3/3/3/3 in order to progress with pelvic floor strengthening to minimize leakage    Time  4    Period  Weeks    Status  New    Target Date  03/19/18            Plan - 02/20/18 1547    Clinical Impression Statement  Pt is making great progess with improved gait mechanics and reports 80% less leakage. Address perineal scar restrictions today and progressed her with pelvic endurance strengthening. Pt continues to benefit from skilled PT     Rehab Potential  Good    PT Frequency  1x / week    PT Duration  12 weeks    PT Treatment/Interventions  Moist Heat;Stair training;Therapeutic activities;Therapeutic exercise;Balance training;Neuromuscular re-education;Patient/family education;Manual lymph drainage;Passive range of motion;Energy conservation;Aquatic Therapy;Biofeedback;Scar mobilization;Manual techniques;Functional mobility training    Consulted and Agree with Plan of Care  Patient       Patient will benefit from skilled therapeutic intervention in order to improve the following  deficits and impairments:  Decreased safety awareness, Decreased endurance, Decreased activity tolerance, Decreased balance, Decreased scar mobility, Hypomobility, Impaired flexibility, Improper body mechanics, Decreased mobility, Decreased strength,  Postural dysfunction, Pain, Increased muscle spasms, Decreased range of motion, Decreased coordination, Increased fascial restricitons, Increased edema, Difficulty walking, Abnormal gait  Visit Diagnosis: Other abnormalities of gait and mobility  Myalgia  Other lack of coordination  At high risk for falls  Chronic bilateral low back pain without sciatica     Problem List Patient Active Problem List   Diagnosis Date Noted  . Uterine cancer ([place]) 01/22/2018  . Prediabetes 10/14/2017  . Hypothyroidism 04/07/2015  . Vitamin D deficiency 04/02/2015  . Macrocytosis 03/28/2015  . Other fatigue 11/25/2014  . Advanced care planning/counseling discussion 03/15/2014  . Health maintenance examination 03/15/2014  . Postsurgical menopause 08/07/2011  . Medicare annual wellness visit, subsequent 08/06/2011  . Osteoarthritis 06/07/2009  . Narcolepsy without cataplexy 05/31/2008  . HLD (hyperlipidemia) 06/03/2007  . MDD (major depressive disorder), recurrent episode, severe ([place]) 06/03/2007  . GERD with stricture 06/03/2007    Jerl Mina ,PT, DPT, E-RYT  02/20/2018, 3:50 PM  [place] MAIN [facility] SERVICES [address] [place], [place], [address] Phone: [phone]   Fax:  [phone]  Name: KRISHNA HEUER MRN: [id number] Date of Birth: [birth year]

## 2018-02-26 ENCOUNTER — Ambulatory Visit: Payer: Medicare Other | Admitting: Physical Therapy

## 2018-02-26 DIAGNOSIS — Z9181 History of falling: Secondary | ICD-10-CM

## 2018-02-26 DIAGNOSIS — M545 Low back pain: Secondary | ICD-10-CM

## 2018-02-26 DIAGNOSIS — G8929 Other chronic pain: Secondary | ICD-10-CM

## 2018-02-26 DIAGNOSIS — R278 Other lack of coordination: Secondary | ICD-10-CM

## 2018-02-26 DIAGNOSIS — M791 Myalgia, unspecified site: Secondary | ICD-10-CM

## 2018-02-26 DIAGNOSIS — R2689 Other abnormalities of gait and mobility: Secondary | ICD-10-CM

## 2018-02-26 NOTE — Patient Instructions (Addendum)
  Clam Shell 45 Degrees   Lying with hips and knees bent 45, one pillow between knees and ankles. Lift knee with exhale. Be sure pelvis does not roll backward. Do not arch back. Do 25 times, each leg, 2 times per day.  http://ss.exer.us/75   Copyright  VHI. All rights reserved.     Complimentary stretch:   Cross thigh over thigh, exhale to hug the thighs in with arms pulling back of thigh, shoulders/ head is relaxed down , Use towel behind thigh is needed.  10 reps  Cross _ ankle overopposite thigh, opposite knee bent, foot on bed ( Figure-4)  exhale to hug the thighs in with arms pulling back of thigh, shoulders/ head is relaxed down , Use towel behind thigh is needed.  10 reps  ____   PELVIC FLOOR / KEGEL EXERCISES   Pelvic floor/ Kegel exercises are used to strengthen the muscles in the base of your pelvis that are responsible for supporting your pelvic organs and preventing urine/feces leakage. Based on your therapist's recommendations, they can be performed while standing, sitting, or lying down. Imagine pelvic floor area as a diamond with pelvic landmarks: top =pubic bone, bottom tip=tailbone, sides=sitting bones (ischial tuberosities).    Make yourself aware of this muscle group by using these cues while coordinating your breath:  Inhale, feel pelvic floor diamond area lower like hammock towards your feet and ribcage/belly expanding. Pause. Let the exhale naturally and feel your belly sink, abdominal muscles hugging in around you and you may notice the pelvic diamond draws upward towards your head forming a umbrella shape. Give a squeeze during the exhalation like you are stopping the flow of urine. If you are squeezing the buttock muscles, try to give 50% less effort.   Common Errors:  Breath holding: If you are holding your breath, you may be bearing down against your bladder instead of pulling it up. If you belly bulges up while you are squeezing, you are holding  your breath. Be sure to breathe gently in and out while exercising. Counting out loud may help you avoid holding your breath.  Accessory muscle use: You should not see or feel other muscle movement when performing pelvic floor exercises. When done properly, no one can tell that you are performing the exercises. Keep the buttocks, belly and inner thighs relaxed.  Overdoing it: Your muscles can fatigue and stop working for you if you over-exercise. You may actually leak more or feel soreness at the lower abdomen or rectum.  YOUR HOME EXERCISE PROGRAM  SHORT HOLDS: Position: on back  Inhale and then exhale. Then squeeze the muscle.  (Be sure to let belly sink in with exhales and not push outward)  Perform 1 repetition after the exhale during DEEP core level 1 exercise x 10 reps in morning and night                        DECREASE DOWNWARD PRESSURE ON  YOUR PELVIC FLOOR, ABDOMINAL, LOW BACK MUSCLES       PRESERVE YOUR PELVIC HEALTH LONG-TERM   ** SQUEEZE pelvic floor BEFORE YOUR SNEEZE, COUGH, LAUGH   ** EXHALE BEFORE YOU RISE AGAINST GRAVITY (lifting, sit to stand, from squat to stand)   ** LOG ROLL OUT OF BED INSTEAD OF CRUNCH/SIT-UP

## 2018-02-26 NOTE — Therapy (Signed)
[place] MAIN [facility] SERVICES [address] [place], [place], [address] Phone: [phone]   Fax:  [phone]  Physical Therapy Treatment  Patient Details  Name: Faith Santiago MRN: [id number] Date of Birth: [birth year] Referring Provider (PT): Silvano Rusk   Encounter Date: 02/26/2018  PT End of Session - 02/26/18 1057    Visit Number  4    Number of Visits  12    Date for PT Re-Evaluation  04/16/18    Authorization Type  medicare     PT Start Time  7124    PT Stop Time  1100    PT Time Calculation (min)  45 min    Activity Tolerance  Patient tolerated treatment well    Behavior During Therapy  [facility] for tasks assessed/performed       Past Medical History:  Diagnosis Date  . Anemia, unspecified   . Anxiety   . Arthritis    hands R>L, neck and lower back  . Bimalleolar fracture of left ankle 11/19/2013   [place]  . Cancer ([place])   . Cataract   . Depression   . Esophageal reflux   . GERD with stricture 06/03/2007  . Glaucoma   . History of osteopenia    DEXA WNL 2008, 2015  . History of uterine cancer 1999   s/p hysterectomy  . HLD (hyperlipidemia)   . Narcolepsy   . Thyroid disease     Past Surgical History:  Procedure Laterality Date  . bilateral catarct removal  2010  . COLONOSCOPY  05/2003   WNL (Dr Velora Heckler)  . COLONOSCOPY  11/2017   3 small TA, diverticulisis, no f/u needed Carlean Purl)  . DEXA  03/2014   WNL T score +3.9  . ESOPHAGOGASTRODUODENOSCOPY  11/2017   dilated esoph stenosis, 8cm HH ([facility]ner)  . ESOPHAGOGASTRODUODENOSCOPY  12/2017   repeat dilation of esoph stenosis, 5cm HH - rec stay on PPI Carlean Purl)  . ORIF ANKLE FRACTURE Left 11/19/2013   Procedure: LEFT ANKLE FRACTURE OPEN TREATMENT BIMALLEOLAR ANKLE INCLUDES INTERNAL FIXATION ;  Surgeon: Johnny Bridge, MD  . RETINAL DETACHMENT REPAIR W/ SCLERAL BUCKLE LE Right 03/21/2016  . TOTAL ABDOMINAL HYSTERECTOMY  1999   uterine cancer    There were no vitals  filed for this visit.  Subjective Assessment - 02/26/18 1020    Subjective  Pt feels she is able to tighten up her pelvic floor after last session. Pt had a smaller amount of leakage before making it to the bathroom in time last week which is an improvement from the "pouring" leakage which soiled her underwear/ pants.      Pertinent History  gynecology Hx: 2 vaginal births with "severe" perineal tear with her first child. Abdominal hysterectomy and radiation 2/2 uterine cancer in 1999. ORIF in L ankle from fall 2014     Patient Stated Goals  stand after a fall, improve bladder and bowel leakage, decrease falls          [facility] PT Assessment - 02/26/18 1057      Sit to Stand   Comments  genu valgus                 Pelvic Floor Special Questions - 02/26/18 1044    Pelvic Floor Internal Exam  pt consented verbally without contraindications     Exam Type  Vaginal    Palpation  increased perineal scar 4-6  o'clock     Strength  fair squeeze, definite lift  Strength # of reps  10    Strength # of seconds  1        [facility] Adult PT Treatment/Exercise - 02/26/18 1052      Neuro Re-ed    Neuro Re-ed Details   see pt instruction ( cued for no ab oversuse with pelvic floor contraction  )        Exercises   Exercises  --   see pt instruction      Manual Therapy   Internal Pelvic Floor  STM/ MWM at perineal scar 4- 6 o'clock                   PT Long Term Goals - 02/19/18 1013      PT LONG TERM GOAL #1   Title  Five Times Sit to Stand   decrease to 16.6 sec with no hands on chair and no genu valgus in order to minimize risk for falls     Baseline   19.6 sec , no hands on chair, genu valgus  ( 11/7: 16.83 s)     Time  8    Period  Weeks    Status  Achieved      PT LONG TERM GOAL #2   Title  Pt will decrease ODI % from 24% to < 12% in order to return to ADLs    Time  12    Period  Weeks    Status  On-going      PT LONG TERM GOAL #3   Title  Pt will increase  her knee flexion strength to 4/5 B and plantarflexion to 10 reps in order to improve gait     Time  8    Period  Weeks    Status  On-going      PT LONG TERM GOAL #4   Title  Pt will demo proper deep core coordination with optimal diaphragmatic breathing and pelvic floor mobility in order to minimize urinary and fecal incontinence    Time  4    Period  Weeks    Status  On-going      PT LONG TERM GOAL #5   Title  Pt will increase her gait speed from 0.8 m/s to > 1.0 m/s in order to ambulate safely  ( 02/19/18: 1.3m/s)     Time  8    Period  Weeks    Status  Achieved      Additional Long Term Goals   Additional Long Term Goals  Yes      PT LONG TERM GOAL #6   Title  Pt will be referred to falls prevention PT after d/c in order to minimize fall risks     Time  12    Period  Weeks    Status  New      PT LONG TERM GOAL #7   Title  Pt will demo proper pelvic floor coordination Grade 3/3/3/3 in order to progress with pelvic floor strengthening to minimize leakage    Time  4    Period  Weeks    Status  New    Target Date  03/19/18            Plan - 02/26/18 1022    Clinical Impression Statement  Pt continues to make progress from last session as she reported she had a smaller amount of leakage before making it to the bathroom in time last week which is an improvement from the "pouring" leakage which  soiled her underwear/ pants. Pt demo'd decreased perineal scar restrictions after intervaginal manual Tx today and achieved stronger pelvic floor contraction after cues for better coordination with breathing to prevent dysynergia. Progressed with hip abduction strengthening to minimize genu valgus with sit to stand. Pt continues to benefit from skilled PT.       Rehab Potential  Good    PT Frequency  1x / week    PT Duration  12 weeks    PT Treatment/Interventions  Moist Heat;Stair training;Therapeutic activities;Therapeutic exercise;Balance training;Neuromuscular  re-education;Patient/family education;Manual lymph drainage;Passive range of motion;Energy conservation;Aquatic Therapy;Biofeedback;Scar mobilization;Manual techniques;Functional mobility training    Consulted and Agree with Plan of Care  Patient       Patient will benefit from skilled therapeutic intervention in order to improve the following deficits and impairments:  Decreased safety awareness, Decreased endurance, Decreased activity tolerance, Decreased balance, Decreased scar mobility, Hypomobility, Impaired flexibility, Improper body mechanics, Decreased mobility, Decreased strength, Postural dysfunction, Pain, Increased muscle spasms, Decreased range of motion, Decreased coordination, Increased fascial restricitons, Increased edema, Difficulty walking, Abnormal gait  Visit Diagnosis: No diagnosis found.     Problem List Patient Active Problem List   Diagnosis Date Noted  . Uterine cancer ([place]) 01/22/2018  . Prediabetes 10/14/2017  . Hypothyroidism 04/07/2015  . Vitamin D deficiency 04/02/2015  . Macrocytosis 03/28/2015  . Other fatigue 11/25/2014  . Advanced care planning/counseling discussion 03/15/2014  . Health maintenance examination 03/15/2014  . Postsurgical menopause 08/07/2011  . Medicare annual wellness visit, subsequent 08/06/2011  . Osteoarthritis 06/07/2009  . Narcolepsy without cataplexy 05/31/2008  . HLD (hyperlipidemia) 06/03/2007  . MDD (major depressive disorder), recurrent episode, severe ([place]) 06/03/2007  . GERD with stricture 06/03/2007    Jerl Mina ,PT, DPT, E-RYT  02/26/2018, 10:58 AM  [place] MAIN [facility] SERVICES [address] [place], [place], [address] Phone: [phone]   Fax:  [phone]  Name: Faith Santiago MRN: [id number] Date of Birth: January 08, 1941

## 2018-03-05 ENCOUNTER — Ambulatory Visit: Payer: Medicare Other | Admitting: Physical Therapy

## 2018-03-05 DIAGNOSIS — M545 Low back pain: Secondary | ICD-10-CM

## 2018-03-05 DIAGNOSIS — R278 Other lack of coordination: Secondary | ICD-10-CM

## 2018-03-05 DIAGNOSIS — M791 Myalgia, unspecified site: Secondary | ICD-10-CM

## 2018-03-05 DIAGNOSIS — Z9181 History of falling: Secondary | ICD-10-CM

## 2018-03-05 DIAGNOSIS — R2689 Other abnormalities of gait and mobility: Secondary | ICD-10-CM | POA: Diagnosis not present

## 2018-03-05 DIAGNOSIS — G8929 Other chronic pain: Secondary | ICD-10-CM

## 2018-03-05 NOTE — Patient Instructions (Signed)
Hip flexor stretch by wall:  ski track stance, feet are hip width apart     Sliding the arm that is ont he same side as your back leg up slight and down to feel stretch in the front body and groin    Front knee above ankle for stabilty   10 reps

## 2018-03-06 ENCOUNTER — Telehealth: Payer: Self-pay | Admitting: Family Medicine

## 2018-03-06 NOTE — Telephone Encounter (Signed)
error 

## 2018-03-06 NOTE — Therapy (Signed)
[place] MAIN [facility] SERVICES [address] [place], [place], [address] Phone: [phone]   Fax:  [phone]  Physical Therapy Treatment / Discharge Summary   Patient Details  Name: Faith Santiago MRN: [id number] Date of Birth: 07/10/1940 Referring Provider (PT): Silvano Rusk   Encounter Date: 03/05/2018  PT End of Session - 03/05/18 1026    Visit Number  5    Number of Visits  12    Date for PT Re-Evaluation  04/16/18    Authorization Type  medicare     PT Start Time  1020    PT Stop Time  1046    PT Time Calculation (min)  26 min    Activity Tolerance  Patient tolerated treatment well    Behavior During Therapy  [facility] for tasks assessed/performed       Past Medical History:  Diagnosis Date  . Anemia, unspecified   . Anxiety   . Arthritis    hands R>L, neck and lower back  . Bimalleolar fracture of left ankle 11/19/2013   [place]  . Cancer ([place])   . Cataract   . Depression   . Esophageal reflux   . GERD with stricture 06/03/2007  . Glaucoma   . History of osteopenia    DEXA WNL 2008, 2015  . History of uterine cancer 1999   s/p hysterectomy  . HLD (hyperlipidemia)   . Narcolepsy   . Thyroid disease     Past Surgical History:  Procedure Laterality Date  . bilateral catarct removal  2010  . COLONOSCOPY  05/2003   WNL (Dr Velora Heckler)  . COLONOSCOPY  11/2017   3 small TA, diverticulisis, no f/u needed Carlean Purl)  . DEXA  03/2014   WNL T score +3.9  . ESOPHAGOGASTRODUODENOSCOPY  11/2017   dilated esoph stenosis, 8cm HH (Gessner)  . ESOPHAGOGASTRODUODENOSCOPY  12/2017   repeat dilation of esoph stenosis, 5cm HH - rec stay on PPI Carlean Purl)  . ORIF ANKLE FRACTURE Left 11/19/2013   Procedure: LEFT ANKLE FRACTURE OPEN TREATMENT BIMALLEOLAR ANKLE INCLUDES INTERNAL FIXATION ;  Surgeon: Johnny Bridge, MD  . RETINAL DETACHMENT REPAIR W/ SCLERAL BUCKLE LE Right 03/21/2016  . TOTAL ABDOMINAL HYSTERECTOMY  1999   uterine cancer     There were no vitals filed for this visit.  Subjective Assessment - 03/05/18 1023    Subjective  Pt reported she only had one incident of urinary leakage last week     Pertinent History  gynecology Hx: 2 vaginal births with "severe" perineal tear with her first child. Abdominal hysterectomy and radiation 2/2 uterine cancer in 1999. ORIF in L ankle from fall 2014     Patient Stated Goals  stand after a fall, improve bladder and bowel leakage, decrease falls          [facility] PT Assessment - 03/05/18 1044      PROM   Overall PROM Comments  limited hip ext ~~5 deg ( post Tx: ~15 deg) in sidelying       Strength   Overall Strength Comments  B hip 4+/5 abduction      Transfers   Five time sit to stand comments   no genu valgus                 Pelvic Floor Special Questions - 03/05/18 1045    External Palpation  dyscoordination with endurance training 3 reps, withheld from HEP  PT Long Term Goals - 03/05/18 1023      PT LONG TERM GOAL #1   Title  Five Times Sit to Stand   decrease to 16.6 sec with no hands on chair and no genu valgus in order to minimize risk for falls     Baseline   19.6 sec , no hands on chair, genu valgus  ( 11/7: 16.83 s)     Time  8    Period  Weeks    Status  Achieved      PT LONG TERM GOAL #2   Title  Pt will decrease ODI % from 24% to < 12% in order to return to ADLs ( 11/21: 15.7%)     Time  12    Period  Weeks    Status  Partially Met      PT LONG TERM GOAL #3   Title  Pt will increase her knee flexion strength to 4/5 B and plantarflexion to 10 reps in order to improve gait  ( 11/21: B 4/5)     Time  8    Period  Weeks    Status  Achieved      PT LONG TERM GOAL #4   Title  Pt will demo proper deep core coordination with optimal diaphragmatic breathing and pelvic floor mobility in order to minimize urinary and fecal incontinence    Time  4    Period  Weeks    Status  Achieved      PT LONG TERM  GOAL #5   Title  Pt will increase her gait speed from 0.8 m/s to > 1.0 m/s in order to ambulate safely  ( 02/19/18: 1.84ms)     Time  8    Period  Weeks    Status  Achieved      PT LONG TERM GOAL #6   Title  Pt will be referred to falls prevention PT after d/c in order to minimize fall risks     Time  12    Period  Weeks    Status  Achieved      PT LONG TERM GOAL #7   Title  Pt will demo proper pelvic floor coordination Grade 3/3/3/3 in order to progress with pelvic floor strengthening to minimize leakage    Time  4    Period  Weeks    Status  Deferred            Plan - 03/05/18 1038    Clinical Impression Statement  Pt has achieved 5/6 goals and reports she has only had 1 epsiode of leakage last week compared to prior to PT when she used to leak daily. Pt has demo'd signficantly decreased perineal scar restrictions, pelvic floor mm tightness, improved pelvic floor coordination/ deep core mm. Pt also demo'd less risk for falls with faster gait speed and 5 Times Sit To Stand time. Pt also showed increased hip abduction strength. Pt was not advanced to endurance pelvic floor training as pt demo'd difficulty coordination and also due to pt's prior hyperactivity of pelvic floor mm.  Pt has been referred to Fall Prevention PT program upon d/c. Pt is ready for d/c today.     Rehab Potential  Good    PT Frequency  1x / week    PT Duration  12 weeks    PT Treatment/Interventions  Moist Heat;Stair training;Therapeutic activities;Therapeutic exercise;Balance training;Neuromuscular re-education;Patient/family education;Manual lymph drainage;Passive range of motion;Energy conservation;Aquatic Therapy;Biofeedback;Scar mobilization;Manual techniques;Functional mobility training  Consulted and Agree with Plan of Care  Patient       Patient will benefit from skilled therapeutic intervention in order to improve the following deficits and impairments:  Decreased safety awareness, Decreased  endurance, Decreased activity tolerance, Decreased balance, Decreased scar mobility, Hypomobility, Impaired flexibility, Improper body mechanics, Decreased mobility, Decreased strength, Postural dysfunction, Pain, Increased muscle spasms, Decreased range of motion, Decreased coordination, Increased fascial restricitons, Increased edema, Difficulty walking, Abnormal gait  Visit Diagnosis: Other abnormalities of gait and mobility  Myalgia  Other lack of coordination  At high risk for falls  Chronic bilateral low back pain without sciatica     Problem List Patient Active Problem List   Diagnosis Date Noted  . Uterine cancer ([place]) 01/22/2018  . Prediabetes 10/14/2017  . Hypothyroidism 04/07/2015  . Vitamin D deficiency 04/02/2015  . Macrocytosis 03/28/2015  . Other fatigue 11/25/2014  . Advanced care planning/counseling discussion 03/15/2014  . Health maintenance examination 03/15/2014  . Postsurgical menopause 08/07/2011  . Medicare annual wellness visit, subsequent 08/06/2011  . Osteoarthritis 06/07/2009  . Narcolepsy without cataplexy 05/31/2008  . HLD (hyperlipidemia) 06/03/2007  . MDD (major depressive disorder), recurrent episode, severe ([place]) 06/03/2007  . GERD with stricture 06/03/2007    Jerl Mina ,PT, DPT, E-RYT  03/06/2018, 9:52 AM  [place] MAIN [facility] SERVICES [address] [place], [place], [address] Phone: [phone]   Fax:  [phone]  Name: Faith Santiago MRN: [id number] Date of Birth: [birth year]

## 2018-03-15 ENCOUNTER — Encounter: Payer: Self-pay | Admitting: Family Medicine

## 2018-03-15 DIAGNOSIS — R296 Repeated falls: Secondary | ICD-10-CM | POA: Insufficient documentation

## 2018-03-15 DIAGNOSIS — R32 Unspecified urinary incontinence: Secondary | ICD-10-CM | POA: Insufficient documentation

## 2018-03-15 DIAGNOSIS — W19XXXA Unspecified fall, initial encounter: Secondary | ICD-10-CM | POA: Insufficient documentation

## 2018-03-16 ENCOUNTER — Encounter: Payer: Medicare Other | Admitting: Physical Therapy

## 2018-03-26 ENCOUNTER — Other Ambulatory Visit: Payer: Self-pay | Admitting: Family Medicine

## 2018-03-26 ENCOUNTER — Encounter: Payer: Medicare Other | Admitting: Physical Therapy

## 2018-03-26 DIAGNOSIS — E785 Hyperlipidemia, unspecified: Secondary | ICD-10-CM

## 2018-03-26 DIAGNOSIS — E559 Vitamin D deficiency, unspecified: Secondary | ICD-10-CM

## 2018-03-26 DIAGNOSIS — R7303 Prediabetes: Secondary | ICD-10-CM

## 2018-03-30 ENCOUNTER — Other Ambulatory Visit (INDEPENDENT_AMBULATORY_CARE_PROVIDER_SITE_OTHER): Payer: Medicare Other

## 2018-03-30 DIAGNOSIS — E785 Hyperlipidemia, unspecified: Secondary | ICD-10-CM | POA: Diagnosis not present

## 2018-03-30 DIAGNOSIS — R7303 Prediabetes: Secondary | ICD-10-CM | POA: Diagnosis not present

## 2018-03-30 DIAGNOSIS — E559 Vitamin D deficiency, unspecified: Secondary | ICD-10-CM

## 2018-03-30 LAB — LIPID PANEL
Cholesterol: 264 mg/dL — ABNORMAL HIGH (ref 0–200)
HDL: 74 mg/dL (ref >39.00)
NonHDL: 189.92
TRIGLYCERIDES: 204 mg/dL — AB (ref 0.0–149.0)
Total CHOL/HDL Ratio: 4
VLDL: 40.8 mg/dL — AB (ref 0.0–40.0)

## 2018-03-30 LAB — HEMOGLOBIN A1C: Hgb A1c MFr Bld: 6 % (ref 4.6–6.5)

## 2018-03-30 LAB — VITAMIN D 25 HYDROXY (VIT D DEFICIENCY, FRACTURES): VITD: 21.99 ng/mL — ABNORMAL LOW (ref 30.00–100.00)

## 2018-03-30 LAB — LDL CHOLESTEROL, DIRECT: Direct LDL: 174 mg/dL

## 2018-04-01 ENCOUNTER — Other Ambulatory Visit: Payer: Self-pay

## 2018-04-04 ENCOUNTER — Other Ambulatory Visit: Payer: Self-pay | Admitting: Family Medicine

## 2018-04-04 MED ORDER — VITAMIN D 50 MCG (2000 UT) PO CAPS: 1.0000 | ORAL_CAPSULE | Freq: Every day | ORAL | Status: AC

## 2018-04-06 ENCOUNTER — Other Ambulatory Visit: Payer: Self-pay

## 2018-04-13 ENCOUNTER — Other Ambulatory Visit: Payer: Self-pay

## 2018-04-20 ENCOUNTER — Other Ambulatory Visit: Payer: Self-pay

## 2018-04-22 ENCOUNTER — Other Ambulatory Visit: Payer: Self-pay

## 2018-04-27 ENCOUNTER — Other Ambulatory Visit: Payer: Self-pay

## 2018-04-29 ENCOUNTER — Encounter: Payer: Self-pay | Admitting: Physical Therapy

## 2018-04-29 ENCOUNTER — Ambulatory Visit: Payer: Medicare Other | Attending: Internal Medicine | Admitting: Physical Therapy

## 2018-04-29 ENCOUNTER — Other Ambulatory Visit: Payer: Self-pay

## 2018-04-29 DIAGNOSIS — M545 Low back pain: Secondary | ICD-10-CM | POA: Diagnosis present

## 2018-04-29 DIAGNOSIS — M6281 Muscle weakness (generalized): Secondary | ICD-10-CM

## 2018-04-29 DIAGNOSIS — R278 Other lack of coordination: Secondary | ICD-10-CM | POA: Diagnosis present

## 2018-04-29 DIAGNOSIS — R2681 Unsteadiness on feet: Secondary | ICD-10-CM | POA: Diagnosis present

## 2018-04-29 DIAGNOSIS — G8929 Other chronic pain: Secondary | ICD-10-CM | POA: Diagnosis present

## 2018-04-29 DIAGNOSIS — Z9181 History of falling: Secondary | ICD-10-CM | POA: Diagnosis present

## 2018-04-29 DIAGNOSIS — R2689 Other abnormalities of gait and mobility: Secondary | ICD-10-CM | POA: Insufficient documentation

## 2018-04-29 NOTE — Therapy (Signed)
[place] MAIN [facility] SERVICES [address] [place], [place], [address] Phone: [phone]   Fax:  [phone]  Physical Therapy Evaluation  Patient Details  Name: Faith Santiago MRN: [id number] Date of Birth: [birth year] Referring Provider (PT): Dr. Danise Mina   Encounter Date: 04/29/2018  PT End of Session - 04/29/18 1417    Visit Number  1    Number of Visits  17    Date for PT Re-Evaluation  06/24/18    Authorization Type  eval 04/29/18    PT Start Time  1305    PT Stop Time  1405    PT Time Calculation (min)  60 min    Equipment Utilized During Treatment  Gait belt    Activity Tolerance  Patient tolerated treatment well    Behavior During Therapy  [facility] for tasks assessed/performed       Past Medical History:  Diagnosis Date  . Anemia, unspecified   . Anxiety   . Arthritis    hands R>L, neck and lower back  . Bimalleolar fracture of left ankle 11/19/2013   [place]  . Cancer ([place])   . Cataract   . Depression   . Esophageal reflux   . GERD with stricture 06/03/2007  . Glaucoma   . History of osteopenia    DEXA WNL 2008, 2015  . History of uterine cancer 1999   s/p hysterectomy  . HLD (hyperlipidemia)   . Narcolepsy   . Thyroid disease     Past Surgical History:  Procedure Laterality Date  . bilateral catarct removal  2010  . COLONOSCOPY  05/2003   WNL (Dr Velora Heckler)  . COLONOSCOPY  11/2017   3 small TA, diverticulisis, no f/u needed Carlean Purl)  . DEXA  03/2014   WNL T score +3.9  . ESOPHAGOGASTRODUODENOSCOPY  11/2017   dilated esoph stenosis, 8cm HH ([facility]ner)  . ESOPHAGOGASTRODUODENOSCOPY  12/2017   repeat dilation of esoph stenosis, 5cm HH - rec stay on PPI Carlean Purl)  . ORIF ANKLE FRACTURE Left 11/19/2013   Procedure: LEFT ANKLE FRACTURE OPEN TREATMENT BIMALLEOLAR ANKLE INCLUDES INTERNAL FIXATION ;  Surgeon: Johnny Bridge, MD  . RETINAL DETACHMENT REPAIR W/ SCLERAL BUCKLE LE Right 03/21/2016  . TOTAL ABDOMINAL  HYSTERECTOMY  1999   uterine cancer    There were no vitals filed for this visit.   Subjective Assessment - 04/29/18 1314    Subjective  "I have had some really bad falls and I am concerned about my balance."     Pertinent History  78 yo Female with history of low back pain, reports impaired balance and increased fear of falling; Reports increased history of severe falls. Her last fall occurred in Sept 2019, when she tripped over a floor transition; at that time she was unable to get back up; She reports suffering a bruise as a result of fall; Patient is currently not using any assistive device; She reports that she doesn't do a lot of walking but recently took a trip where she had to walk for long distances. She reports having to take increased rest breaks due to fatigue and back pain; Denies any LE pain;     Limitations  Standing;Walking    How long can you sit comfortably?  NA    How long can you stand comfortably?  increased pain with prolonged standing >30 min    How long can you walk comfortably?  about 500 feet with fatigue/back pain;     Diagnostic tests  none recent    Patient Stated Goals  walk farther, reduce fall risk;     Currently in Pain?  No/denies         [facility] PT Assessment - 04/29/18 0001      Assessment   Medical Diagnosis  Impaired balance/LBP    Referring Provider (PT)  Dr. Danise Mina    Onset Date/Surgical Date  12/14/17    Hand Dominance  Right    Next MD Visit  Spring 2020    Prior Therapy  had pelvic health PT with good results; denies any PT for imbalance;       Precautions   Precautions  Fall      Restrictions   Weight Bearing Restrictions  No      Balance Screen   Has the patient fallen in the past 6 months  Yes    How many times?  1    Has the patient had a decrease in activity level because of a fear of falling?   Yes    Is the patient reluctant to leave their home because of a fear of falling?   No      Home Environment   Additional Comments   Lives in 2 story home, does have 5 stairs to enter, negotiates steps one step at a time with rail assist; mod I for self care ADLs; does cooking/cleaning;       Prior Function   Level of Independence  Independent    Vocation  Retired    Leisure  likes to read, some sewing/quilting; does sit most of the day ;      Cognition   Overall Cognitive Status  Within Functional Limits for tasks assessed      Observation/Other Assessments   Observations  very pleasant woman;     Activities of Balance Confidence Scale (ABC Scale)   63%   pt did fill out survey, but unsure of accuracy     Sensation   Light Touch  Appears Intact    Proprioception  Appears Intact      Coordination   Gross Motor Movements are Fluid and Coordinated  Yes    Fine Motor Movements are Fluid and Coordinated  Yes    Finger Nose Finger Test  accurate bilaterally;    Heel Shin Test  accurate bilaterally;      Posture/Postural Control   Posture Comments  able to achieve erect posture, does have mild thoracic kyphosis and moderate forward rounded shoulders      AROM   Overall AROM Comments  BUE and BLE are [facility]      Strength   Right Hip Flexion  4-/5    Right Hip Extension  3/5    Right Hip ABduction  3/5    Right Hip ADduction  3-/5    Left Hip Flexion  3/5    Left Hip Extension  3/5    Left Hip ABduction  3/5    Left Hip ADduction  3+/5    Right Knee Flexion  4/5    Right Knee Extension  4+/5    Left Knee Flexion  4/5    Left Knee Extension  4+/5    Right Ankle Dorsiflexion  4+/5    Right Ankle Plantar Flexion  3-/5    Left Ankle Dorsiflexion  4+/5    Left Ankle Plantar Flexion  3-/5      Palpation   Spinal mobility  hypomobile throughout lumbar spine, no pain reported with central PA mobs  Palpation comment  denies any tenderness to palpation of lumbar paraspinals      Special Tests    Special Tests  Lumbar    Lumbar Tests  Prone Knee Bend Test;Straight Leg Raise      Prone Knee Bend Test    Findings  Negative    Side  --   bilaterally     Straight Leg Raise   Findings  Negative    Side   --   bilaterally     Transfers   Comments  requires HHA to push on rail for transfers      Ambulation/Gait   Gait Comments  ambulates without AD, reciprocal gait, normal gait speed, decreased stride length; decreased hip/knee flexion during swing leading to shorter stride;       6 Minute Walk- Baseline   6 Minute Walk- Baseline  yes    BP (mmHg)  130/72    HR (bpm)  75    02 Sat (%RA)  99 %    Modified Borg Scale for Dyspnea  0- Nothing at all      6 Minute walk- Post Test   6 Minute Walk Post Test  yes    BP (mmHg)  170/66    HR (bpm)  95    02 Sat (%RA)  100 %    Modified Borg Scale for Dyspnea  3- Moderate shortness of breath or breathing difficulty      6 minute walk test results    Aerobic Endurance Distance Walked  1215    Endurance additional comments  no assistive device; less than age group norms of 1527 feet      Standardized Balance Assessment   10 Meter Walk  1.17 m/s without AD, community ambulator      High Level Balance   High Level Balance Comments  Mini Best test: 15/28 indicating high fall risk                Objective measurements completed on examination: See above findings.              PT Education - 04/29/18 1417    Education Details  recommendations/Plan of care    Person(s) Educated  Patient    Methods  Explanation    Comprehension  Verbalized understanding       PT Short Term Goals - 04/29/18 1428      PT SHORT TERM GOAL #1   Title  Patient will be adherent to HEP at least 3x a week to improve functional strength and balance for better safety at home.    Time  4    Period  Weeks    Status  New    Target Date  05/27/18      PT SHORT TERM GOAL #2   Title  Patient will increase six minute walk test distance to >1300 for progression to closer to age group norms and improve functional gait distance;     Time  4     Period  Weeks    Status  New    Target Date  05/27/18        PT Long Term Goals - 04/29/18 1428      PT LONG TERM GOAL #1   Title  patient will improve mini best test to >20/28 to indicate reduced fall risk and improved balance ability;     Time  8    Period  Weeks    Status  New    Target  Date  06/24/18      PT LONG TERM GOAL #2   Title  Patient will report a worst pain of 3/10 on VAS in  low back    to improve tolerance with ADLs and reduced symptoms with activities.     Time  8    Period  Weeks    Status  New    Target Date  06/24/18      PT LONG TERM GOAL #3   Title  Patient will increase BLE gross strength to 4+/5 as to improve functional strength for independent gait, increased standing tolerance and increased ADL ability.    Time  8    Period  Weeks    Status  New    Target Date  06/24/18             Plan - 04/29/18 1418    Clinical Impression Statement  78 yo Female reports general decline in balance over last several years. She reports some severe falls resulting in significant injury over last few years which has contributed to her fear of falling. Patient does exhibit weakness in BLE particularly in hip and ankle. She ambulates without an assistive device, mod I, but does exhibit decreased stride length as a result of decreased hip/knee flexion during swing. Patient tested as a high fall risk on mini best test and other outcome measures. In addition she does have low back pain which does seem to be exacerbated with prolonged standing and walking. She would benefit from additional skilled PT Intervention to improve strength, balance and gait safety;     History and Personal Factors relevant to plan of care:  lives with husband, mod I for self care ADLs; does have steps at home and has fallen in last 6 months, co-morbidities including low back pain, OA and s/p LLE ankle ORIF which all could contribute to high fall risk;     Clinical Presentation  Evolving     Clinical Presentation due to:  high fall risk with recent fall, increased back pain with weight bearing;     Clinical Decision Making  Moderate    Rehab Potential  Good    Clinical Impairments Affecting Rehab Potential  motivated, good results from previous therapy    PT Frequency  2x / week    PT Duration  8 weeks    PT Treatment/Interventions  Aquatic Therapy;Cryotherapy;Electrical Stimulation;Moist Heat;Gait training;Stair training;Functional mobility training;Therapeutic activities;Therapeutic exercise;Balance training;Neuromuscular re-education;Patient/family education;Manual techniques;Passive range of motion;Energy conservation    PT Next Visit Plan  initiate HEP, work on balance    PT Home Exercise Plan  will address next session;     Consulted and Agree with Plan of Care  Patient       Patient will benefit from skilled therapeutic intervention in order to improve the following deficits and impairments:  Abnormal gait, Decreased endurance, Hypomobility, Cardiopulmonary status limiting activity, Decreased activity tolerance, Decreased strength, Pain, Difficulty walking, Decreased mobility, Decreased balance, Postural dysfunction, Decreased safety awareness  Visit Diagnosis: Muscle weakness (generalized)  Chronic bilateral low back pain without sciatica  Unsteadiness on feet     Problem List Patient Active Problem List   Diagnosis Date Noted  . Urinary incontinence 03/15/2018  . Falls 03/15/2018  . Uterine cancer ([place]) 01/22/2018  . Prediabetes 10/14/2017  . Hypothyroidism 04/07/2015  . Vitamin D deficiency 04/02/2015  . Macrocytosis 03/28/2015  . Other fatigue 11/25/2014  . Advanced care planning/counseling discussion 03/15/2014  . Health maintenance examination 03/15/2014  . Postsurgical  menopause 08/07/2011  . Medicare annual wellness visit, subsequent 08/06/2011  . Osteoarthritis 06/07/2009  . Narcolepsy without cataplexy 05/31/2008  . HLD (hyperlipidemia)  06/03/2007  . MDD (major depressive disorder), recurrent episode, severe ([place]) 06/03/2007  . GERD with stricture 06/03/2007    Nayara Taplin PT, DPT 04/29/2018, 2:30 PM  [place] MAIN [facility] SERVICES [address] [place], [place], [address] Phone: [phone]   Fax:  [phone]  Name: BRENEE GAJDA MRN: [id number] Date of Birth: June 16, 1940

## 2018-05-04 ENCOUNTER — Other Ambulatory Visit: Payer: Self-pay

## 2018-05-05 ENCOUNTER — Ambulatory Visit: Payer: Self-pay | Admitting: Physical Therapy

## 2018-05-06 ENCOUNTER — Other Ambulatory Visit: Payer: Self-pay

## 2018-05-07 ENCOUNTER — Ambulatory Visit: Payer: Self-pay | Admitting: Physical Therapy

## 2018-05-11 ENCOUNTER — Other Ambulatory Visit: Payer: Self-pay

## 2018-05-11 ENCOUNTER — Encounter: Payer: Self-pay | Admitting: Physical Therapy

## 2018-05-11 ENCOUNTER — Ambulatory Visit: Payer: Medicare Other

## 2018-05-11 ENCOUNTER — Ambulatory Visit: Payer: Medicare Other | Admitting: Physical Therapy

## 2018-05-11 DIAGNOSIS — G8929 Other chronic pain: Secondary | ICD-10-CM

## 2018-05-11 DIAGNOSIS — R2681 Unsteadiness on feet: Secondary | ICD-10-CM

## 2018-05-11 DIAGNOSIS — M6281 Muscle weakness (generalized): Secondary | ICD-10-CM

## 2018-05-11 DIAGNOSIS — M545 Low back pain: Secondary | ICD-10-CM

## 2018-05-11 DIAGNOSIS — R2689 Other abnormalities of gait and mobility: Secondary | ICD-10-CM

## 2018-05-11 NOTE — Therapy (Signed)
[place] MAIN [facility] SERVICES [address] [place], [place], [address] Phone: [phone]   Fax:  [phone]  Physical Therapy Treatment  Patient Details  Name: Faith Santiago MRN: [id number] Date of Birth: 1940/08/27 Referring Provider (PT): Dr. Danise Mina   Encounter Date: 05/11/2018  PT End of Session - 05/11/18 1348    Visit Number  2    Number of Visits  17    Date for PT Re-Evaluation  06/24/18    Authorization Type  eval 04/29/18, 2/10 for PN    PT Start Time  1304    PT Stop Time  1345    PT Time Calculation (min)  41 min    Equipment Utilized During Treatment  Gait belt    Activity Tolerance  Patient tolerated treatment well    Behavior During Therapy  [facility] for tasks assessed/performed       Past Medical History:  Diagnosis Date  . Anemia, unspecified   . Anxiety   . Arthritis    hands R>L, neck and lower back  . Bimalleolar fracture of left ankle 11/19/2013   [place]  . Cancer ([place])   . Cataract   . Depression   . Esophageal reflux   . GERD with stricture 06/03/2007  . Glaucoma   . History of osteopenia    DEXA WNL 2008, 2015  . History of uterine cancer 1999   s/p hysterectomy  . HLD (hyperlipidemia)   . Narcolepsy   . Thyroid disease     Past Surgical History:  Procedure Laterality Date  . bilateral catarct removal  2010  . COLONOSCOPY  05/2003   WNL (Dr Velora Heckler)  . COLONOSCOPY  11/2017   3 small TA, diverticulisis, no f/u needed Carlean Purl)  . DEXA  03/2014   WNL T score +3.9  . ESOPHAGOGASTRODUODENOSCOPY  11/2017   dilated esoph stenosis, 8cm HH (Gessner)  . ESOPHAGOGASTRODUODENOSCOPY  12/2017   repeat dilation of esoph stenosis, 5cm HH - rec stay on PPI Carlean Purl)  . ORIF ANKLE FRACTURE Left 11/19/2013   Procedure: LEFT ANKLE FRACTURE OPEN TREATMENT BIMALLEOLAR ANKLE INCLUDES INTERNAL FIXATION ;  Surgeon: Johnny Bridge, MD  . RETINAL DETACHMENT REPAIR W/ SCLERAL BUCKLE LE Right 03/21/2016  . TOTAL ABDOMINAL  HYSTERECTOMY  1999   uterine cancer    There were no vitals filed for this visit.  Subjective Assessment - 05/11/18 1309    Subjective  Patient has no complaints at start of session, no falls or stumbles since last visit, reported that she was sore.    Pertinent History  78 yo Female with history of low back pain, reports impaired balance and increased fear of falling; Reports increased history of severe falls. Her last fall occurred in Sept 2019, when she tripped over a floor transition; at that time she was unable to get back up; She reports suffering a bruise as a result of fall; Patient is currently not using any assistive device; She reports that she doesn't do a lot of walking but recently took a trip where she had to walk for long distances. She reports having to take increased rest breaks due to fatigue and back pain; Denies any LE pain;     Limitations  Standing;Walking    How long can you sit comfortably?  NA    How long can you stand comfortably?  increased pain with prolonged standing >30 min    How long can you walk comfortably?  about 500 feet with fatigue/back  pain;     Diagnostic tests  none recent    Patient Stated Goals  walk farther, reduce fall risk;     Currently in Pain?  No/denies      TREATMENT:  Access Code: [email or web address]   Therapeutic Exercises:       Nustep level 2 30mins and then 3 for 1 mins x 3mins with PT monitoring throughout,       intervals for cardiovascular endurance, strengthening Seated Heel Toe Raises - 10 reps - 2 sets with demo and verbal cues for exercise technique Seated Long Arc Quad - 10 reps - 2 sets with demo and verbal cues for exercise technique Seated March - 10 reps - 2 sets with demo and verbal cues for exercise technique Sit to Stand with Arms Crossed - 10 reps - 2 sets from elevated surface x 10 x10  From slightly lowered surface from first set with demo and verbal cues for eccentric control Bridge - 10 reps - 2 sets with TA activation, with  verbal and tactile cues hooklying hip abduction with RTB 2x10 with verbal/tactile cues for form SLR x10 bilaterally with verbal/tactile cues for form  Neuromuscular re-edu: Toe taps to 4" step in // bars, CGA, able to progress for single UE to no UE support, verbal cues for knee flexion, upright posture Step over hurdles fwd/backward/side to side x10 ea way bilaterally, CGA throughout, progressed for bilateral UE to no UE support, cues for upright posture, foot clearance, step length bilaterally     PT Education - 05/11/18 1310    Education Details  HEP, exericse technique/form    Person(s) Educated  Patient    Methods  Explanation;Demonstration;Handout;Verbal cues    Comprehension  Verbalized understanding       PT Short Term Goals - 04/29/18 1428      PT SHORT TERM GOAL #1   Title  Patient will be adherent to HEP at least 3x a week to improve functional strength and balance for better safety at home.    Time  4    Period  Weeks    Status  New    Target Date  05/27/18      PT SHORT TERM GOAL #2   Title  Patient will increase six minute walk test distance to >1300 for progression to closer to age group norms and improve functional gait distance;     Time  4    Period  Weeks    Status  New    Target Date  05/27/18        PT Long Term Goals - 04/29/18 1428      PT LONG TERM GOAL #1   Title  patient will improve mini best test to >20/28 to indicate reduced fall risk and improved balance ability;     Time  8    Period  Weeks    Status  New    Target Date  06/24/18      PT LONG TERM GOAL #2   Title  Patient will report a worst pain of 3/10 on VAS in  low back    to improve tolerance with ADLs and reduced symptoms with activities.     Time  8    Period  Weeks    Status  New    Target Date  06/24/18      PT LONG TERM GOAL #3   Title  Patient will increase BLE gross strength to 4+/5 as to improve functional strength for independent  gait, increased standing tolerance and  increased ADL ability.    Time  8    Period  Weeks    Status  New    Target Date  06/24/18            Plan - 05/11/18 1346    Clinical Impression Statement  Patient demonstrated good tolerance to activity today, no complaints of low back pain and demonstrated great effort/participation with exercises. Patient needed multimodal cues for new exercises today to maintain proper technique and form to optimize movement. The patient was most challenged by dynamic balance activities without UE support. The patient would benefit from further skilled PT to progress towards goals. HEP administered this session.     Rehab Potential  Good    Clinical Impairments Affecting Rehab Potential  motivated, good results from previous therapy    PT Frequency  2x / week    PT Duration  8 weeks    PT Treatment/Interventions  Aquatic Therapy;Cryotherapy;Electrical Stimulation;Moist Heat;Gait training;Stair training;Functional mobility training;Therapeutic activities;Therapeutic exercise;Balance training;Neuromuscular re-education;Patient/family education;Manual techniques;Passive range of motion;Energy conservation    PT Next Visit Plan  initiate HEP, work on balance    PT Home Exercise Plan  see pt instructions, medbridge code: [email or web address]    Consulted and Agree with Plan of Care  Patient       Patient will benefit from skilled therapeutic intervention in order to improve the following deficits and impairments:  Abnormal gait, Decreased endurance, Hypomobility, Cardiopulmonary status limiting activity, Decreased activity tolerance, Decreased strength, Pain, Difficulty walking, Decreased mobility, Decreased balance, Postural dysfunction, Decreased safety awareness  Visit Diagnosis: Muscle weakness (generalized)  Chronic bilateral low back pain without sciatica  Unsteadiness on feet  Other abnormalities of gait and mobility     Problem List Patient Active Problem List   Diagnosis Date Noted  . Urinary  incontinence 03/15/2018  . Falls 03/15/2018  . Uterine cancer ([place]) 01/22/2018  . Prediabetes 10/14/2017  . Hypothyroidism 04/07/2015  . Vitamin D deficiency 04/02/2015  . Macrocytosis 03/28/2015  . Other fatigue 11/25/2014  . Advanced care planning/counseling discussion 03/15/2014  . Health maintenance examination 03/15/2014  . Postsurgical menopause 08/07/2011  . Medicare annual wellness visit, subsequent 08/06/2011  . Osteoarthritis 06/07/2009  . Narcolepsy without cataplexy 05/31/2008  . HLD (hyperlipidemia) 06/03/2007  . MDD (major depressive disorder), recurrent episode, severe ([place]) 06/03/2007  . GERD with stricture 06/03/2007    Lieutenant Diego PT, DPT 1:51 PM,05/11/18 [place] MAIN [facility] SERVICES [address] [place], [place], [address] Phone: [phone]   Fax:  [phone]  Name: Faith Santiago MRN: [id number] Date of Birth: 1941-01-01

## 2018-05-11 NOTE — Patient Instructions (Signed)
Access Code: [email or web address]  URL: [email or web address]  Date: 05/11/2018  Prepared by: Lieutenant Diego   Exercises  Seated Heel Toe Raises - 10 reps - 2 sets - 1x daily - 7x weekly  Seated Long Arc Quad - 10 reps - 2 sets - 1x daily - 7x weekly  Seated March - 10 reps - 2 sets - 1x daily - 7x weekly  Sit to Stand with Arms Crossed - 10 reps - 2 sets - 1x daily - 7x weekly  Bridge - 10 reps - 2 sets - 1x daily - 7x weekly

## 2018-05-13 ENCOUNTER — Encounter: Payer: Self-pay | Admitting: Physical Therapy

## 2018-05-13 ENCOUNTER — Ambulatory Visit: Payer: Medicare Other | Admitting: Physical Therapy

## 2018-05-13 ENCOUNTER — Other Ambulatory Visit: Payer: Self-pay

## 2018-05-13 DIAGNOSIS — R2689 Other abnormalities of gait and mobility: Secondary | ICD-10-CM

## 2018-05-13 DIAGNOSIS — M6281 Muscle weakness (generalized): Secondary | ICD-10-CM | POA: Diagnosis not present

## 2018-05-13 DIAGNOSIS — R278 Other lack of coordination: Secondary | ICD-10-CM

## 2018-05-13 DIAGNOSIS — R2681 Unsteadiness on feet: Secondary | ICD-10-CM

## 2018-05-13 DIAGNOSIS — Z9181 History of falling: Secondary | ICD-10-CM

## 2018-05-13 NOTE — Therapy (Signed)
[place] MAIN [facility] SERVICES [address] [place], [place], [address] Phone: [phone]   Fax:  [phone]  Physical Therapy Treatment  Patient Details  Name: Faith Santiago MRN: [id number] Date of Birth: [birth year] Referring Provider (PT): Dr. Danise Mina   Encounter Date: 05/13/2018  PT End of Session - 05/13/18 1307    Visit Number  3    Number of Visits  17    Date for PT Re-Evaluation  06/24/18    Authorization Type  eval 04/29/18, 3/10 for PN    PT Start Time  0102    PT Stop Time  0140    PT Time Calculation (min)  38 min    Equipment Utilized During Treatment  Gait belt    Activity Tolerance  Patient tolerated treatment well    Behavior During Therapy  [facility] for tasks assessed/performed       Past Medical History:  Diagnosis Date  . Anemia, unspecified   . Anxiety   . Arthritis    hands R>L, neck and lower back  . Bimalleolar fracture of left ankle 11/19/2013   [place]  . Cancer ([place])   . Cataract   . Depression   . Esophageal reflux   . GERD with stricture 06/03/2007  . Glaucoma   . History of osteopenia    DEXA WNL 2008, 2015  . History of uterine cancer 1999   s/p hysterectomy  . HLD (hyperlipidemia)   . Narcolepsy   . Thyroid disease     Past Surgical History:  Procedure Laterality Date  . bilateral catarct removal  2010  . COLONOSCOPY  05/2003   WNL (Dr Velora Heckler)  . COLONOSCOPY  11/2017   3 small TA, diverticulisis, no f/u needed Carlean Purl)  . DEXA  03/2014   WNL T score +3.9  . ESOPHAGOGASTRODUODENOSCOPY  11/2017   dilated esoph stenosis, 8cm HH ([facility]ner)  . ESOPHAGOGASTRODUODENOSCOPY  12/2017   repeat dilation of esoph stenosis, 5cm HH - rec stay on PPI Carlean Purl)  . ORIF ANKLE FRACTURE Left 11/19/2013   Procedure: LEFT ANKLE FRACTURE OPEN TREATMENT BIMALLEOLAR ANKLE INCLUDES INTERNAL FIXATION ;  Surgeon: Johnny Bridge, MD  . RETINAL DETACHMENT REPAIR W/ SCLERAL BUCKLE LE Right 03/21/2016  . TOTAL ABDOMINAL  HYSTERECTOMY  1999   uterine cancer    There were no vitals filed for this visit.  Subjective Assessment - 05/13/18 1306    Subjective  Patient has no complaints at start of session, no falls.    Pertinent History  78 yo Female with history of low back pain, reports impaired balance and increased fear of falling; Reports increased history of severe falls. Her last fall occurred in Sept 2019, when she tripped over a floor transition; at that time she was unable to get back up; She reports suffering a bruise as a result of fall; Patient is currently not using any assistive device; She reports that she doesn't do a lot of walking but recently took a trip where she had to walk for long distances. She reports having to take increased rest breaks due to fatigue and back pain; Denies any LE pain;     Limitations  Standing;Walking    How long can you sit comfortably?  NA    How long can you stand comfortably?  increased pain with prolonged standing >30 min    How long can you walk comfortably?  about 500 feet with fatigue/back pain;     Diagnostic tests  none recent  Patient Stated Goals  walk farther, reduce fall risk;     Currently in Pain?  No/denies    Multiple Pain Sites  No         Neuromuscular re-edu Lateral Toe taps to 6" step without UE support alternating LE x15 each : Toe taps to 4" step in // bars, CGA, able to progress for single UE to no UE support, verbal cues for knee flexion, upright posture Step over hurdles fwd/backward/side to side x10 ea way bilaterally, CGA throughout, progressed for bilateral UE to no UE support, cues for upright posture, foot clearance, step length bilaterally  Step foam to 6 inch stool  fwd/backward/side to side x10 ea way bilaterally, CGA throughout, progressed for bilateral UE to no UE support, cues for upright posture, foot clearance, step length bilaterally Matrix fwd/bwd x 5 22. 5 lbs, side stepping 7. 5 lbs x 3 left and right with min assist    Ther-ex  Standing hip abd with 2# ankle weight x 15 BLE and  BUE support  Standing hip extension with 2# ankle weight x 10 BLE with BUE support  Standing hip flexion marches with 2# ankle weight x 10 BLE with BUE support  Standing HS curls with 2# ankle weight x 10 BLE with BUE support  Standing heel raises x 15 with 2# ankle weights  Leg press 75 lbs x 20 x 2    CGA and Min to mod verbal cues used throughout with increased in postural sway and LOB most seen with narrow base of support and while on uneven surfaces. Continues to have balance deficits typical with diagnosis. Patient performs intermediate level exercises without pain behaviors and needs verbal cuing for postural alignment and head positioning Tactile cues and assistance needed to keep lower leg and knee in neutral to avoid compensations with ankle motions.                      PT Education - 05/13/18 1307    Education Details  HEP    Person(s) Educated  Patient    Methods  Explanation    Comprehension  Returned demonstration;Need further instruction       PT Short Term Goals - 04/29/18 1428      PT SHORT TERM GOAL #1   Title  Patient will be adherent to HEP at least 3x a week to improve functional strength and balance for better safety at home.    Time  4    Period  Weeks    Status  New    Target Date  05/27/18      PT SHORT TERM GOAL #2   Title  Patient will increase six minute walk test distance to >1300 for progression to closer to age group norms and improve functional gait distance;     Time  4    Period  Weeks    Status  New    Target Date  05/27/18        PT Long Term Goals - 04/29/18 1428      PT LONG TERM GOAL #1   Title  patient will improve mini best test to >20/28 to indicate reduced fall risk and improved balance ability;     Time  8    Period  Weeks    Status  New    Target Date  06/24/18      PT LONG TERM GOAL #2   Title  Patient will report a worst pain of  3/10 on VAS in   low back    to improve tolerance with ADLs and reduced symptoms with activities.     Time  8    Period  Weeks    Status  New    Target Date  06/24/18      PT LONG TERM GOAL #3   Title  Patient will increase BLE gross strength to 4+/5 as to improve functional strength for independent gait, increased standing tolerance and increased ADL ability.    Time  8    Period  Weeks    Status  New    Target Date  06/24/18            Plan - 05/13/18 1309    Clinical Impression Statement  Patient presents with decreased gait speed, decreased balance, and decreased BLE strength. Patient's main complaint is BLE weakness and inability to participate in desired activities. Patient wants to improve balance and ability to ambulate on inclines and outdoor surfaces safely. Patient will benefit from skilled PT in order to increase gait speed, increase BLE strength, and improve dynamic standing balance to decrease risk for falls and enable patient to participate in desired activities.    Rehab Potential  Good    Clinical Impairments Affecting Rehab Potential  motivated, good results from previous therapy    PT Frequency  2x / week    PT Duration  8 weeks    PT Treatment/Interventions  Aquatic Therapy;Cryotherapy;Electrical Stimulation;Moist Heat;Gait training;Stair training;Functional mobility training;Therapeutic activities;Therapeutic exercise;Balance training;Neuromuscular re-education;Patient/family education;Manual techniques;Passive range of motion;Energy conservation    PT Next Visit Plan  initiate HEP, work on balance    PT Home Exercise Plan  see pt instructions, medbridge code: [email or web address]    Consulted and Agree with Plan of Care  Patient       Patient will benefit from skilled therapeutic intervention in order to improve the following deficits and impairments:  Abnormal gait, Decreased endurance, Hypomobility, Cardiopulmonary status limiting activity, Decreased activity tolerance, Decreased  strength, Pain, Difficulty walking, Decreased mobility, Decreased balance, Postural dysfunction, Decreased safety awareness  Visit Diagnosis: Muscle weakness (generalized)  Unsteadiness on feet  Other lack of coordination  At high risk for falls  Other abnormalities of gait and mobility     Problem List Patient Active Problem List   Diagnosis Date Noted  . Urinary incontinence 03/15/2018  . Falls 03/15/2018  . Uterine cancer ([place]) 01/22/2018  . Prediabetes 10/14/2017  . Hypothyroidism 04/07/2015  . Vitamin D deficiency 04/02/2015  . Macrocytosis 03/28/2015  . Other fatigue 11/25/2014  . Advanced care planning/counseling discussion 03/15/2014  . Health maintenance examination 03/15/2014  . Postsurgical menopause 08/07/2011  . Medicare annual wellness visit, subsequent 08/06/2011  . Osteoarthritis 06/07/2009  . Narcolepsy without cataplexy 05/31/2008  . HLD (hyperlipidemia) 06/03/2007  . MDD (major depressive disorder), recurrent episode, severe ([place]) 06/03/2007  . GERD with stricture 06/03/2007    Alanson Puls , PT DPT 05/13/2018, 1:11 PM  [place] MAIN [facility] SERVICES [address] [place], [place], [address] Phone: [phone]   Fax:  [phone]  Name: Faith Santiago MRN: [id number] Date of Birth: March 13, 1941

## 2018-05-18 ENCOUNTER — Ambulatory Visit: Payer: Medicare Other | Admitting: Psychiatry

## 2018-05-18 ENCOUNTER — Other Ambulatory Visit: Payer: Self-pay

## 2018-05-18 ENCOUNTER — Encounter: Payer: Self-pay | Admitting: Psychiatry

## 2018-05-18 ENCOUNTER — Ambulatory Visit: Payer: Self-pay | Admitting: Physical Therapy

## 2018-05-18 VITALS — BP 126/80 | HR 103 | Temp 97.4°F | Wt 207.6 lb

## 2018-05-18 DIAGNOSIS — F331 Major depressive disorder, recurrent, moderate: Secondary | ICD-10-CM | POA: Diagnosis not present

## 2018-05-18 MED ORDER — VENLAFAXINE HCL ER 150 MG PO CP24: 150.0000 mg | ORAL_CAPSULE | Freq: Every day | ORAL | 1 refills | Status: DC

## 2018-05-18 MED ORDER — ARIPIPRAZOLE 10 MG PO TABS: 10.0000 mg | ORAL_TABLET | Freq: Every day | ORAL | 3 refills | Status: DC

## 2018-05-18 MED ORDER — ALPRAZOLAM 0.25 MG PO TABS: 0.2500 mg | ORAL_TABLET | Freq: Every evening | ORAL | 2 refills | Status: DC | PRN

## 2018-05-18 NOTE — Progress Notes (Signed)
Psychiatric MD Progress Note   Patient Identification: Faith Santiago MRN:  [id number] Date of Evaluation:  05/18/2018 Referral Source: PCP Chief Complaint:   Chief Complaint    Follow-up; Medication Refill     Visit Diagnosis:    ICD-10-CM   1. MDD (major depressive disorder), recurrent episode, moderate (HCC) F33.1    Diagnosis:   Patient Active Problem List   Diagnosis Date Noted  . Urinary incontinence [R32] 03/15/2018  . Falls [phone]] 03/15/2018  . Uterine cancer ([place]) [C55] 01/22/2018  . Prediabetes [R73.03] 10/14/2017  . Hypothyroidism [E03.9] 04/07/2015  . Vitamin D deficiency [E55.9] 04/02/2015  . Macrocytosis [D75.89] 03/28/2015  . Other fatigue [R53.83] 11/25/2014  . Advanced care planning/counseling discussion [Z71.89] 03/15/2014  . Health maintenance examination [Z00.00] 03/15/2014  . Postsurgical menopause [E89.40] 08/07/2011  . Medicare annual wellness visit, subsequent [Z00.00] 08/06/2011  . Osteoarthritis [M19.90] 06/07/2009  . Narcolepsy without cataplexy [G47.419] 05/31/2008  . HLD (hyperlipidemia) [E78.5] 06/03/2007  . MDD (major depressive disorder), recurrent episode, severe ([place]) [F33.2] 06/03/2007  . GERD with stricture [K21.9, K22.2] 06/03/2007   History of Present Illness:   Patient is a 78 year old married female who presented for follow up.  She reported that she has been doing well and has been compliant with her medications.  She reported that she does not have any side effects as she only takes Xanax on a as needed basis.  She reported that she has received a letter from her pharmacy that they will not fill her medications unless she will get the electronic prescriptions.  She takes Xanax 1-2 times at night in the week.  She reported that it helps with her sleep.  She reported that other medications are helpful and she does not have any mood symptoms and it remains a stable.  She appeared calm and alert during the interview.  No other acute side  effects noted.  Denied having any perceptual disturbances denied having any suicidal homicidal ideations or plans.  She has started doing physical therapy to maintain her gait and her balance.     She gets her medication from the mail order pharmacy.         Elements:  Severity:  moderate. Associated Signs/Symptoms: Depression Symptoms:  fatigue, anxiety, loss of energy/fatigue, (Hypo) Manic Symptoms:  none Anxiety Symptoms:  Excessive Worry, Psychotic Symptoms:  none PTSD Symptoms: Negative NA  Past Medical History:  Past Medical History:  Diagnosis Date  . Anemia, unspecified   . Anxiety   . Arthritis    hands R>L, neck and lower back  . Bimalleolar fracture of left ankle 11/19/2013   [place]  . Cancer ([place])   . Cataract   . Depression   . Esophageal reflux   . GERD with stricture 06/03/2007  . Glaucoma   . History of osteopenia    DEXA WNL 2008, 2015  . History of uterine cancer 1999   s/p hysterectomy  . HLD (hyperlipidemia)   . Narcolepsy   . Thyroid disease     Past Surgical History:  Procedure Laterality Date  . bilateral catarct removal  2010  . COLONOSCOPY  05/2003   WNL (Dr Velora Heckler)  . COLONOSCOPY  11/2017   3 small TA, diverticulisis, no f/u needed Carlean Purl)  . DEXA  03/2014   WNL T score +3.9  . ESOPHAGOGASTRODUODENOSCOPY  11/2017   dilated esoph stenosis, 8cm HH ([facility])  . ESOPHAGOGASTRODUODENOSCOPY  12/2017   repeat dilation of esoph stenosis, 5cm HH - rec stay on PPI (  Carlean Purl)  . ORIF ANKLE FRACTURE Left 11/19/2013   Procedure: LEFT ANKLE FRACTURE OPEN TREATMENT BIMALLEOLAR ANKLE INCLUDES INTERNAL FIXATION ;  Surgeon: Johnny Bridge, MD  . RETINAL DETACHMENT REPAIR W/ SCLERAL BUCKLE LE Right 03/21/2016  . TOTAL ABDOMINAL HYSTERECTOMY  1999   uterine cancer   Family History:  Family History  Problem Relation Age of Onset  . Hypothyroidism Mother   . Stroke Mother        ministroke  . Coronary artery disease Mother 53       s/p some heart  surgery  . Prostate cancer Father   . Diabetes Neg Hx   . Colon cancer Neg Hx   . Esophageal cancer Neg Hx   . Pancreatic cancer Neg Hx   . Stomach cancer Neg Hx   . Liver disease Neg Hx   . Rectal cancer Neg Hx    Social History:   Social History   Socioeconomic History  . Marital status: Married    Spouse name: Not on file  . Number of children: Not on file  . Years of education: Not on file  . Highest education level: Not on file  Occupational History  . Occupation: retired-occupational therapist  Social Needs  . Financial resource strain: Not on file  . Food insecurity:    Worry: Not on file    Inability: Not on file  . Transportation needs:    Medical: Not on file    Non-medical: Not on file  Tobacco Use  . Smoking status: Never Smoker  . Smokeless tobacco: Never Used  Substance and Sexual Activity  . Alcohol use: Yes    Alcohol/week: 7.0 - 12.0 standard drinks    Types: 7 - 10 Glasses of wine per week    Comment: 1 drink /day  . Drug use: No  . Sexual activity: Never  Lifestyle  . Physical activity:    Days per week: Not on file    Minutes per session: Not on file  . Stress: Not on file  Relationships  . Social connections:    Talks on phone: Not on file    Gets together: Not on file    Attends religious service: Not on file    Active member of club or organization: Not on file    Attends meetings of clubs or organizations: Not on file    Relationship status: Not on file  Other Topics Concern  . Not on file  Social History Narrative   caffeine: none   Lives with husband, has 2 grown children, 1 cat   Occupation: retired, was OT for [employer]   Activity: no regular exercise, to start silver sneakers   Diet: overeating, good amt water, vegetables daily, occasional fruits   Never smoker   EtOH 1/day   Additional Social History:  Married x 50 years. Has a son and daughter.  She enjoys quilting and belong to a [employer] group. 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 She  moved to [facility] and worked at [place] in [place].  Moved to [place] in 1976. Worked in [employer] and [employer] x 26 years and retired from there.   Musculoskeletal: Strength & Muscle Tone: within normal limits Gait & Station: normal Patient leans: N/A  Psychiatric Specialty Exam: Medication Refill   Depression          Review of Systems  Psychiatric/Behavioral: Positive for depression.    Blood pressure 126/80, pulse (!) 103, temperature (!) 97.4 F (  36.3 C), temperature source Oral, weight 207 lb 9.6 oz (94.2 kg).Body mass index is 35.63 kg/m.  General Appearance: Casual and Fairly Groomed  Eye Contact:  Fair  Speech:  Clear and Coherent and Slow  Volume:  Normal  Mood:  Depressed  Affect:  Congruent and Depressed  Thought Process:  Goal Directed  Orientation:  Full (Time, Place, and Person)  Thought Content:  WDL  Suicidal Thoughts:  No  Homicidal Thoughts:  No  Memory:  Immediate;   Fair  Judgement:  Fair  Insight:  Fair  Psychomotor Activity:  Normal  Concentration:  Fair  Recall:  [employer] of Knowledge:Fair  Language: Fair  Akathisia:  No  Handed:  Right  AIMS (if indicated):    Assets:  Communication Skills Desire for Improvement Physical Health Social Support  ADL's:  Intact  Cognition: WNL  Sleep:     Is the patient at risk to self?  No. Has the patient been a risk to self in the past 6 months?  No. Has the patient been a risk to self within the distant past?  No. Is the patient a risk to others?  No. Has the patient been a risk to others in the past 6 months?  No. Has the patient been a risk to others within the distant past?  No.  Allergies:   Allergies  Allergen Reactions  . Ciprofloxacin     REACTION: sun induced rash   Current Medications: Current Outpatient Medications  Medication Sig Dispense Refill  . ALPRAZolam (XANAX) 0.25 MG tablet Take 1 tablet (0.25 mg total) by mouth at bedtime as needed for anxiety. 30  tablet 2  . ARIPiprazole (ABILIFY) 10 MG tablet Take 1 tablet (10 mg total) by mouth daily. 90 tablet 3  . Cholecalciferol (VITAMIN D) 50 MCG (2000 UT) CAPS Take 1 capsule (2,000 Units total) by mouth daily. 30 capsule   . levothyroxine (SYNTHROID, LEVOTHROID) 50 MCG tablet TAKE 1 TABLET (50 MCG TOTAL) BY MOUTH DAILY. 90 tablet 1  . omeprazole (PRILOSEC) 40 MG capsule Take 1 capsule (40 mg total) by mouth daily before breakfast. 90 capsule 3  . venlafaxine XR (EFFEXOR-XR) 150 MG 24 hr capsule Take 1 capsule (150 mg total) by mouth daily with breakfast. 90 capsule 1   No current facility-administered medications for this visit.     Previous Psychotropic Medications:  Prozac- worked well.  Wellbutrin Alprazolam- many years Celexa - no change   Substance Abuse History in the last 12 months:  No.  Consequences of Substance Abuse: Negative NA  Medical Decision Making:  Review of Psycho-Social Stressors (1)  Treatment Plan Summary: Medication management   Discussed with patient or the medications treatment risks benefits and alternatives Continue  Abilify 10 mg to help as an add-on to her antidepressant medications Continue  Effexor XR  150 mg and patient will take 1 pills in the morning.  Advised her to start taking alprazolam 0.25 mg  on a when necessary basis. Sent to [facility]  Follow-up in 3 months or earlier depending on her symptoms.       More than 50% of the time spent in psychoeducation, counseling and coordination of care.    This note was generated in part or whole with voice recognition software. Voice regonition is usually quite accurate but there are transcription errors that can and very often do occur. I apologize for any typographical errors that were not detected and corrected.     Rainey Pines, MD  2/3/20202:03 PM

## 2018-05-19 ENCOUNTER — Ambulatory Visit: Payer: Medicare Other | Attending: Internal Medicine | Admitting: Physical Therapy

## 2018-05-19 ENCOUNTER — Encounter: Payer: Self-pay | Admitting: Physical Therapy

## 2018-05-19 DIAGNOSIS — G8929 Other chronic pain: Secondary | ICD-10-CM | POA: Diagnosis present

## 2018-05-19 DIAGNOSIS — M6281 Muscle weakness (generalized): Secondary | ICD-10-CM

## 2018-05-19 DIAGNOSIS — R42 Dizziness and giddiness: Secondary | ICD-10-CM | POA: Diagnosis present

## 2018-05-19 DIAGNOSIS — R278 Other lack of coordination: Secondary | ICD-10-CM | POA: Diagnosis present

## 2018-05-19 DIAGNOSIS — R2681 Unsteadiness on feet: Secondary | ICD-10-CM

## 2018-05-19 DIAGNOSIS — R2689 Other abnormalities of gait and mobility: Secondary | ICD-10-CM

## 2018-05-19 DIAGNOSIS — M545 Low back pain, unspecified: Secondary | ICD-10-CM

## 2018-05-19 DIAGNOSIS — M791 Myalgia, unspecified site: Secondary | ICD-10-CM | POA: Diagnosis present

## 2018-05-19 DIAGNOSIS — Z9181 History of falling: Secondary | ICD-10-CM

## 2018-05-19 NOTE — Therapy (Signed)
[place] MAIN [facility] SERVICES [place], [place], [address] Phone: [phone]   Fax:  [phone]  Physical Therapy Treatment  Patient Details  Name: Faith Santiago MRN: [id number] Date of Birth: [birth year] Referring Provider (PT): Dr. Danise Mina   Encounter Date: 05/19/2018  PT End of Session - 05/19/18 1308    Visit Number  4    Number of Visits  17    Date for PT Re-Evaluation  06/24/18    Authorization Type  eval 04/29/18, 4/10 for PN    PT Start Time  0105    PT Stop Time  0145    PT Time Calculation (min)  40 min    Equipment Utilized During Treatment  Gait belt    Activity Tolerance  Patient tolerated treatment well    Behavior During Therapy  [facility] for tasks assessed/performed       Past Medical History:  Diagnosis Date  . Anemia, unspecified   . Anxiety   . Arthritis    hands R>L, neck and lower back  . Bimalleolar fracture of left ankle 11/19/2013   [place]  . Cancer ([place])   . Cataract   . Depression   . Esophageal reflux   . GERD with stricture 06/03/2007  . Glaucoma   . History of osteopenia    DEXA WNL 2008, 2015  . History of uterine cancer 1999   s/p hysterectomy  . HLD (hyperlipidemia)   . Narcolepsy   . Thyroid disease     Past Surgical History:  Procedure Laterality Date  . bilateral catarct removal  2010  . COLONOSCOPY  05/2003   WNL (Dr Velora Heckler)  . COLONOSCOPY  11/2017   3 small TA, diverticulisis, no f/u needed Carlean Purl)  . DEXA  03/2014   WNL T score +3.9  . ESOPHAGOGASTRODUODENOSCOPY  11/2017   dilated esoph stenosis, 8cm HH ([facility])  . ESOPHAGOGASTRODUODENOSCOPY  12/2017   repeat dilation of esoph stenosis, 5cm HH - rec stay on PPI Carlean Purl)  . ORIF ANKLE FRACTURE Left 11/19/2013   Procedure: LEFT ANKLE FRACTURE OPEN TREATMENT BIMALLEOLAR ANKLE INCLUDES INTERNAL FIXATION ;  Surgeon: Johnny Bridge, MD  . RETINAL DETACHMENT REPAIR W/ SCLERAL BUCKLE LE Right 03/21/2016  . TOTAL ABDOMINAL  HYSTERECTOMY  1999   uterine cancer    There were no vitals filed for this visit.  Subjective Assessment - 05/19/18 1306    Subjective  Patient has no complaints at start of session, no falls.    Pertinent History  78 yo Female with history of low back pain, reports impaired balance and increased fear of falling; Reports increased history of severe falls. Her last fall occurred in Sept 2019, when she tripped over a floor transition; at that time she was unable to get back up; She reports suffering a bruise as a result of fall; Patient is currently not using any assistive device; She reports that she doesn't do a lot of walking but recently took a trip where she had to walk for long distances. She reports having to take increased rest breaks due to fatigue and back pain; Denies any LE pain;     Limitations  Standing;Walking    How long can you sit comfortably?  NA    How long can you stand comfortably?  increased pain with prolonged standing >30 min    How long can you walk comfortably?  about 500 feet with fatigue/back pain;     Diagnostic tests  none recent  Patient Stated Goals  walk farther, reduce fall risk;     Currently in Pain?  Yes    Pain Score  2     Pain Location  Hip    Pain Orientation  Right;Left    Pain Descriptors / Indicators  Aching    Pain Type  Chronic pain    Multiple Pain Sites  No       Neuromuscular re-edu   Standing on airex: Trunk rotation side to side with ball BUE hold and head turning with trunk x 10  Alternate toe taps on 4 inch step without rail assist x15 with cues to step backwards for better foot placement and avoid toe catching; Modified tandem stance with BUE ball up/down x10 each foot in front with CGA for safety;  Standing on airex beam:  ; Side stepping down airex beam without rail assist x3 laps each direction with cues to keep feet on beam and avoid stepping off; Standing with feet apart, BUE ball toss x10 unsupported with close  supervision; Patient exhibits posterior loss of balance requiring cues for forward weight shift;  stepping over hurdle x 10 fwd / bwd, 10 side to side with UE support Lateral Toe taps to 6" step without UE support alternating LE x15 each : Toe taps to 4" step in // bars, CGA, able to progress for single UE to no UE support, verbal cues for knee flexion, upright posture Matrix fwd/bwd x 5 22. 5 lbs, side stepping 7. 5 lbs x 3 left and right with min assist   Ther-ex  TM . 6 miles / hour x 5 mins  Supine hooklying abd/ER with BTB x 20 Supine hooklying with TA and marching x 20  sidelying hip abd x 10 x 2 BLE  Leg press 75 lbs x 20 x 2   CGA and Min to mod verbal cues used throughout with increased in postural sway and LOB most seen with narrow base of support and while on uneven surfaces. Continues to have balance deficits typical with diagnosis. Patient performs intermediate level exercises without pain behaviors and needs verbal cuing for postural alignment  Tactile cues and assistance needed to keep lower leg from coming fwd in sidelying hip abd                        PT Education - 05/19/18 1307    Education Details  HEP    Person(s) Educated  Patient    Methods  Explanation    Comprehension  Verbalized understanding;Returned demonstration;Need further instruction       PT Short Term Goals - 04/29/18 1428      PT SHORT TERM GOAL #1   Title  Patient will be adherent to HEP at least 3x a week to improve functional strength and balance for better safety at home.    Time  4    Period  Weeks    Status  New    Target Date  05/27/18      PT SHORT TERM GOAL #2   Title  Patient will increase six minute walk test distance to >1300 for progression to closer to age group norms and improve functional gait distance;     Time  4    Period  Weeks    Status  New    Target Date  05/27/18        PT Long Term Goals - 04/29/18 1428      PT LONG TERM  GOAL #1   Title   patient will improve mini best test to >20/28 to indicate reduced fall risk and improved balance ability;     Time  8    Period  Weeks    Status  New    Target Date  06/24/18      PT LONG TERM GOAL #2   Title  Patient will report a worst pain of 3/10 on VAS in  low back    to improve tolerance with ADLs and reduced symptoms with activities.     Time  8    Period  Weeks    Status  New    Target Date  06/24/18      PT LONG TERM GOAL #3   Title  Patient will increase BLE gross strength to 4+/5 as to improve functional strength for independent gait, increased standing tolerance and increased ADL ability.    Time  8    Period  Weeks    Status  New    Target Date  06/24/18            Plan - 05/19/18 1308    Clinical Impression Statement  Pt presents with unsteadiness on uneven surfaces and fatigues with therapeutic exercises. Patient needs assist with instruction for balance with side stepping on uneven surfaces,  and needs CGA assist with balance activities. Patient demonstrates difficulty with side stepping  and navigating small spaces with decreased base of support and increased challenges for LE.  Patient tolerated all interventions well this date and will benefit from continued skilled PT interventions to improve strength and balance and decrease risk of falling    Rehab Potential  Good    Clinical Impairments Affecting Rehab Potential  motivated, good results from previous therapy    PT Frequency  2x / week    PT Duration  8 weeks    PT Treatment/Interventions  Aquatic Therapy;Cryotherapy;Electrical Stimulation;Moist Heat;Gait training;Stair training;Functional mobility training;Therapeutic activities;Therapeutic exercise;Balance training;Neuromuscular re-education;Patient/family education;Manual techniques;Passive range of motion;Energy conservation    PT Next Visit Plan  initiate HEP, work on balance    PT Home Exercise Plan  see pt instructions, medbridge code: [email or web address]     Consulted and Agree with Plan of Care  Patient       Patient will benefit from skilled therapeutic intervention in order to improve the following deficits and impairments:  Abnormal gait, Decreased endurance, Hypomobility, Cardiopulmonary status limiting activity, Decreased activity tolerance, Decreased strength, Pain, Difficulty walking, Decreased mobility, Decreased balance, Postural dysfunction, Decreased safety awareness  Visit Diagnosis: Muscle weakness (generalized)  Unsteadiness on feet  Other lack of coordination  At high risk for falls  Other abnormalities of gait and mobility  Chronic bilateral low back pain without sciatica  Myalgia     Problem List Patient Active Problem List   Diagnosis Date Noted  . Urinary incontinence 03/15/2018  . Falls 03/15/2018  . Uterine cancer ([place]) 01/22/2018  . Prediabetes 10/14/2017  . Hypothyroidism 04/07/2015  . Vitamin D deficiency 04/02/2015  . Macrocytosis 03/28/2015  . Other fatigue 11/25/2014  . Advanced care planning/counseling discussion 03/15/2014  . Health maintenance examination 03/15/2014  . Postsurgical menopause 08/07/2011  . Medicare annual wellness visit, subsequent 08/06/2011  . Osteoarthritis 06/07/2009  . Narcolepsy without cataplexy 05/31/2008  . HLD (hyperlipidemia) 06/03/2007  . MDD (major depressive disorder), recurrent episode, severe ([place]) 06/03/2007  . GERD with stricture 06/03/2007    Alanson Puls, PT DPT 05/19/2018, 1:10 PM  [place]  MAIN [facility] SERVICES [place], [place], [address] Phone: [phone]   Fax:  [phone]  Name: CLARIVEL CALLAWAY MRN: [id number] Date of Birth: 07-May-1940

## 2018-05-20 ENCOUNTER — Ambulatory Visit: Payer: Self-pay | Admitting: Physical Therapy

## 2018-05-20 ENCOUNTER — Other Ambulatory Visit: Payer: Self-pay

## 2018-05-21 ENCOUNTER — Ambulatory Visit: Payer: Medicare Other | Admitting: Physical Therapy

## 2018-05-25 ENCOUNTER — Ambulatory Visit: Payer: Self-pay | Admitting: Physical Therapy

## 2018-05-27 ENCOUNTER — Ambulatory Visit: Payer: Self-pay | Admitting: Physical Therapy

## 2018-05-28 ENCOUNTER — Encounter: Payer: Self-pay | Admitting: Physical Therapy

## 2018-05-28 ENCOUNTER — Ambulatory Visit: Payer: Medicare Other | Admitting: Physical Therapy

## 2018-05-28 DIAGNOSIS — M6281 Muscle weakness (generalized): Secondary | ICD-10-CM | POA: Diagnosis not present

## 2018-05-28 DIAGNOSIS — Z9181 History of falling: Secondary | ICD-10-CM

## 2018-05-28 DIAGNOSIS — R278 Other lack of coordination: Secondary | ICD-10-CM

## 2018-05-28 DIAGNOSIS — R2681 Unsteadiness on feet: Secondary | ICD-10-CM

## 2018-05-28 NOTE — Therapy (Signed)
[place] MAIN [facility] SERVICES [address] [place], [place], [address] Phone: [phone]   Fax:  [phone]  Physical Therapy Treatment  Patient Details  Name: Faith Santiago MRN: [id number] Date of Birth: [birth year] Referring Provider (PT): Dr. Danise Mina   Encounter Date: 05/28/2018  PT End of Session - 05/28/18 1701    Visit Number  5    Number of Visits  17    Date for PT Re-Evaluation  06/24/18    Authorization Type  eval 04/29/18, 5/10 for PN    PT Start Time  0248    PT Stop Time  0315    PT Time Calculation (min)  27 min    Equipment Utilized During Treatment  Gait belt    Activity Tolerance  Patient tolerated treatment well    Behavior During Therapy  [facility] for tasks assessed/performed       Past Medical History:  Diagnosis Date  . Anemia, unspecified   . Anxiety   . Arthritis    hands R>L, neck and lower back  . Bimalleolar fracture of left ankle 11/19/2013   [place]  . Cancer ([place])   . Cataract   . Depression   . Esophageal reflux   . GERD with stricture 06/03/2007  . Glaucoma   . History of osteopenia    DEXA WNL 2008, 2015  . History of uterine cancer 1999   s/p hysterectomy  . HLD (hyperlipidemia)   . Narcolepsy   . Thyroid disease     Past Surgical History:  Procedure Laterality Date  . bilateral catarct removal  2010  . COLONOSCOPY  05/2003   WNL (Dr Velora Heckler)  . COLONOSCOPY  11/2017   3 small TA, diverticulisis, no f/u needed Carlean Purl)  . DEXA  03/2014   WNL T score +3.9  . ESOPHAGOGASTRODUODENOSCOPY  11/2017   dilated esoph stenosis, 8cm HH (Gessner)  . ESOPHAGOGASTRODUODENOSCOPY  12/2017   repeat dilation of esoph stenosis, 5cm HH - rec stay on PPI Carlean Purl)  . ORIF ANKLE FRACTURE Left 11/19/2013   Procedure: LEFT ANKLE FRACTURE OPEN TREATMENT BIMALLEOLAR ANKLE INCLUDES INTERNAL FIXATION ;  Surgeon: Johnny Bridge, MD  . RETINAL DETACHMENT REPAIR W/ SCLERAL BUCKLE LE Right 03/21/2016  . TOTAL ABDOMINAL  HYSTERECTOMY  1999   uterine cancer    There were no vitals filed for this visit.  Subjective Assessment - 05/28/18 1701    Subjective  Patient has no complaints at start of session, no falls.    Pertinent History  78 yo Female with history of low back pain, reports impaired balance and increased fear of falling; Reports increased history of severe falls. Her last fall occurred in Sept 2019, when she tripped over a floor transition; at that time she was unable to get back up; She reports suffering a bruise as a result of fall; Patient is currently not using any assistive device; She reports that she doesn't do a lot of walking but recently took a trip where she had to walk for long distances. She reports having to take increased rest breaks due to fatigue and back pain; Denies any LE pain;     Limitations  Standing;Walking    How long can you sit comfortably?  NA    How long can you stand comfortably?  increased pain with prolonged standing >30 min    How long can you walk comfortably?  about 500 feet with fatigue/back pain;     Diagnostic tests  none recent  Patient Stated Goals  walk farther, reduce fall risk;     Currently in Pain?  No/denies    Pain Score  0-No pain       Patient performs : Berg balance 55/56 mini best stopped due to not tolerating parts of the test  6 MW test 1000 feet   5 x sit to stand  12 sec TUG 10.97 10MW tests 1.0 m/sec Patient has fatigue with ambulation and will continue to benefit from skilled PT to improve community ambulation.                        PT Education - 05/28/18 1701    Education Details  Goals and plan of care    Person(s) Educated  Patient    Methods  Explanation    Comprehension  Verbalized understanding       PT Short Term Goals - 04/29/18 1428      PT SHORT TERM GOAL #1   Title  Patient will be adherent to HEP at least 3x a week to improve functional strength and balance for better safety at home.     Time  4    Period  Weeks    Status  New    Target Date  05/27/18      PT SHORT TERM GOAL #2   Title  Patient will increase six minute walk test distance to >1300 for progression to closer to age group norms and improve functional gait distance;     Time  4    Period  Weeks    Status  New    Target Date  05/27/18        PT Long Term Goals - 04/29/18 1428      PT LONG TERM GOAL #1   Title  patient will improve mini best test to >20/28 to indicate reduced fall risk and improved balance ability;     Time  8    Period  Weeks    Status  New    Target Date  06/24/18      PT LONG TERM GOAL #2   Title  Patient will report a worst pain of 3/10 on VAS in  low back    to improve tolerance with ADLs and reduced symptoms with activities.     Time  8    Period  Weeks    Status  New    Target Date  06/24/18      PT LONG TERM GOAL #3   Title  Patient will increase BLE gross strength to 4+/5 as to improve functional strength for independent gait, increased standing tolerance and increased ADL ability.    Time  8    Period  Weeks    Status  New    Target Date  06/24/18            Plan - 05/28/18 1702    Clinical Impression Statement  Patient has fwd flex trunk with ambulation and slow gait speed without AD. She was seen for [facility] balance test 55/56, and atempted mini best with poor tolerance to parts of the test and therefore test stopped. She performed a 6 MW test, TUG and 5 x sit to stand test. She will continue to benefit from skilled PT to work on strengthening and improving overall mobility.     Rehab Potential  Good    Clinical Impairments Affecting Rehab Potential  motivated, good results from previous therapy  PT Frequency  2x / week    PT Duration  8 weeks    PT Treatment/Interventions  Aquatic Therapy;Cryotherapy;Electrical Stimulation;Moist Heat;Gait training;Stair training;Functional mobility training;Therapeutic activities;Therapeutic exercise;Balance  training;Neuromuscular re-education;Patient/family education;Manual techniques;Passive range of motion;Energy conservation    PT Next Visit Plan  initiate HEP, work on balance    PT Home Exercise Plan  see pt instructions, medbridge code: [email or web address]    Consulted and Agree with Plan of Care  Patient       Patient will benefit from skilled therapeutic intervention in order to improve the following deficits and impairments:  Abnormal gait, Decreased endurance, Hypomobility, Cardiopulmonary status limiting activity, Decreased activity tolerance, Decreased strength, Pain, Difficulty walking, Decreased mobility, Decreased balance, Postural dysfunction, Decreased safety awareness  Visit Diagnosis: Muscle weakness (generalized)  Unsteadiness on feet  Other lack of coordination  At high risk for falls     Problem List Patient Active Problem List   Diagnosis Date Noted  . Urinary incontinence 03/15/2018  . Falls 03/15/2018  . Uterine cancer ([place]) 01/22/2018  . Prediabetes 10/14/2017  . Hypothyroidism 04/07/2015  . Vitamin D deficiency 04/02/2015  . Macrocytosis 03/28/2015  . Other fatigue 11/25/2014  . Advanced care planning/counseling discussion 03/15/2014  . Health maintenance examination 03/15/2014  . Postsurgical menopause 08/07/2011  . Medicare annual wellness visit, subsequent 08/06/2011  . Osteoarthritis 06/07/2009  . Narcolepsy without cataplexy 05/31/2008  . HLD (hyperlipidemia) 06/03/2007  . MDD (major depressive disorder), recurrent episode, severe ([place]) 06/03/2007  . GERD with stricture 06/03/2007    Alanson Puls, PT DPT 05/28/2018, 5:13 PM  [place] MAIN [facility] SERVICES [address] [place], [place], [address] Phone: [phone]   Fax:  [phone]  Name: Faith Santiago MRN: [id number] Date of Birth: [birth year]

## 2018-06-01 ENCOUNTER — Ambulatory Visit: Payer: Self-pay | Admitting: Physical Therapy

## 2018-06-02 ENCOUNTER — Ambulatory Visit: Payer: Medicare Other

## 2018-06-02 VITALS — BP 138/76 | HR 108 | Temp 97.9°F

## 2018-06-02 DIAGNOSIS — M6281 Muscle weakness (generalized): Secondary | ICD-10-CM | POA: Diagnosis not present

## 2018-06-02 DIAGNOSIS — R2681 Unsteadiness on feet: Secondary | ICD-10-CM

## 2018-06-02 DIAGNOSIS — R42 Dizziness and giddiness: Secondary | ICD-10-CM

## 2018-06-02 NOTE — Therapy (Signed)
[place] MAIN [facility] SERVICES [address] [place], [place], [address] Phone: [phone]   Fax:  [phone]  Physical Therapy Treatment  Patient Details  Name: Faith Santiago MRN: [id number] Date of Birth: Aug 05, 1940 Referring Provider (PT): Dr. Danise Mina   Encounter Date: 06/02/2018  PT End of Session - 06/02/18 1529    Visit Number  6    Number of Visits  17    Date for PT Re-Evaluation  06/24/18    Authorization Type  eval 04/29/18, goals updated 05/28/18    PT Start Time  1525    PT Stop Time  1605    PT Time Calculation (min)  40 min    Equipment Utilized During Treatment  Gait belt    Activity Tolerance  Patient tolerated treatment well    Behavior During Therapy  [facility] for tasks assessed/performed       Past Medical History:  Diagnosis Date  . Anemia, unspecified   . Anxiety   . Arthritis    hands R>L, neck and lower back  . Bimalleolar fracture of left ankle 11/19/2013   [place]  . Cancer ([place])   . Cataract   . Depression   . Esophageal reflux   . GERD with stricture 06/03/2007  . Glaucoma   . History of osteopenia    DEXA WNL 2008, 2015  . History of uterine cancer 1999   s/p hysterectomy  . HLD (hyperlipidemia)   . Narcolepsy   . Thyroid disease     Past Surgical History:  Procedure Laterality Date  . bilateral catarct removal  2010  . COLONOSCOPY  05/2003   WNL (Dr Velora Heckler)  . COLONOSCOPY  11/2017   3 small TA, diverticulisis, no f/u needed Carlean Purl)  . DEXA  03/2014   WNL T score +3.9  . ESOPHAGOGASTRODUODENOSCOPY  11/2017   dilated esoph stenosis, 8cm HH ([facility])  . ESOPHAGOGASTRODUODENOSCOPY  12/2017   repeat dilation of esoph stenosis, 5cm HH - rec stay on PPI Carlean Purl)  . ORIF ANKLE FRACTURE Left 11/19/2013   Procedure: LEFT ANKLE FRACTURE OPEN TREATMENT BIMALLEOLAR ANKLE INCLUDES INTERNAL FIXATION ;  Surgeon: Johnny Bridge, MD  . RETINAL DETACHMENT REPAIR W/ SCLERAL BUCKLE LE Right 03/21/2016  . TOTAL  ABDOMINAL HYSTERECTOMY  1999   uterine cancer    Vitals:   06/02/18 1530  BP: 138/76  Pulse: (!) 108  Temp: 97.9 F (36.6 C)  SpO2: 97%    Subjective Assessment - 06/02/18 1529    Subjective  Patient has no complaints at start of session except feeling very tired this morning. No falls. No low back pain currently.     Pertinent History  78 yo Female with history of low back pain, reports impaired balance and increased fear of falling; Reports increased history of severe falls. Her last fall occurred in Sept 2019, when she tripped over a floor transition; at that time she was unable to get back up; She reports suffering a bruise as a result of fall; Patient is currently not using any assistive device; She reports that she doesn't do a lot of walking but recently took a trip where she had to walk for long distances. She reports having to take increased rest breaks due to fatigue and back pain; Denies any LE pain;     Limitations  Standing;Walking    How long can you sit comfortably?  NA    How long can you stand comfortably?  increased pain with prolonged standing >  30 min    How long can you walk comfortably?  about 500 feet with fatigue/back pain;     Diagnostic tests  none recent    Patient Stated Goals  walk farther, reduce fall risk;     Currently in Pain?  No/denies           TREATMENT  Performed DGI with patient (see below) Performed oculomotor/vestibular testing with patient (see below)   OCULOMOTOR / VESTIBULAR TESTING:  Oculomotor Exam- Room Light  Findings Comments  Ocular Alignment normal   Ocular ROM normal   Spontaneous Nystagmus normal   End-Gaze Nystagmus normal   Smooth Pursuit abnormal Saccadic especially vertically  Saccades abnormal Consistently hypometric in all directions  VOR normal   VOR Cancellation normal   Left Head Thrust normal   Right Head Thrust normal   Head Shaking Nystagmus not examined   Static Acuity not examined   Dynamic Acuity  not examined     Oculomotor Exam- Fixation Suppressed  Findings Comments  Ocular Alignment normal   Ocular ROM normal   Spontaneous Nystagmus normal   End-Gaze Nystagmus normal   Head Shaking Nystagmus not examined     BPPV TESTS:  Symptoms Duration Intensity Nystagmus  L Dix-Hallpike Mild vertigo, unsteadiness, visual disturbance 5-8 seconds Pt does not rate 5-8 beats of upbeating L torsional nystagmus  R Dix-Hallpike None   None  L Head Roll      R Head Roll      L Sidelying Test      R Sidelying Test         [facility] PT Assessment - 06/02/18 1718      Standardized Balance Assessment   Standardized Balance Assessment  Dynamic Gait Index      Dynamic Gait Index   Level Surface  Normal    Change in Gait Speed  Normal    Gait with Horizontal Head Turns  Mild Impairment    Gait with Vertical Head Turns  Moderate Impairment    Gait and Pivot Turn  Normal    Step Over Obstacle  Moderate Impairment    Step Around Obstacles  Normal    Steps  Moderate Impairment    Total Score  17              PT Short Term Goals - 06/03/18 1629      PT SHORT TERM GOAL #1   Title  Patient will be adherent to HEP at least 3x a week to improve functional strength and balance for better safety at home.    Time  4    Period  Weeks    Status  On-going    Target Date  05/27/18      PT SHORT TERM GOAL #2   Title  Patient will increase six minute walk test distance to >1300 for progression to closer to age group norms and improve functional gait distance;     Baseline  05/28/18: 1000'    Time  4    Period  Weeks    Status  Partially Met    Target Date  05/27/18        PT Long Term Goals - 06/03/18 1625      PT LONG TERM GOAL #1   Title  patient will improve mini best test to >20/28 to indicate reduced fall risk and improved balance ability;     Time  8    Period  Weeks    Status  New  Target Date  06/24/18      PT LONG TERM GOAL #2   Title  Patient will report a worst pain  of 3/10 on VAS in  low back    to improve tolerance with ADLs and reduced symptoms with activities.     Time  8    Period  Weeks    Status  New    Target Date  06/24/18      PT LONG TERM GOAL #3   Title  Patient will increase BLE gross strength to 4+/5 as to improve functional strength for independent gait, increased standing tolerance and increased ADL ability.    Time  8    Period  Weeks    Status  New    Target Date  06/24/18      PT LONG TERM GOAL #4   Title  Pt will improve DGI by at least 3 points in order to demonstrate clinically significant improvement in balance and decreased risk for falls     Baseline  06/02/18: 17/24    Time  8    Period  Weeks    Status  New    Target Date  06/24/18      PT LONG TERM GOAL #5   Title  --    Baseline  --    Time  8    Period  --    Status  --    Target Date  --            Plan - 06/02/18 1530    Clinical Impression Statement  Upon arrival pt with elevated resting HR around 108-115 bpm. All other vitals are WNL. Pt denies feeling unwell today. Denies chest pain or palpitations. Pt taken to plinth and allowed to lie down and her heart rate increases to around 140 bpm and stays elevated for almost 10 minutes. Pt appears anxious but is not diaphoretic and has no symptoms. After an extended period of rest her heart rate returns to around 105 bpm. Pt reports feeling mildly anxious and she has not had much to eat or drink during the day. Proceeded to perform DGI with patient who scored 17/24 and demonstrates significant difficulty with horizontal and vertical head turns. Oculomotor/vestibular screening performed with patient which shows central signs of saccadic smooth pursuit eye tracking and consistently hypometric saccades. Dix-Hallpike testing performed which is negative on the R side but positive on the L side for upbeating L torsional nystagmus of 5-8 beats with concurrent complaints of vertigo. Added canalith repositioning treatment to  plan of care and will send for additional authorization to perform Epley with patient to treat presumed L posterior canal BPPV. Pt encouraged to continue HEP and follow-up as scheduled. She will benefit from continued skilled PT interventions to improve strength and balance and decrease risk of falling.    Rehab Potential  Good    Clinical Impairments Affecting Rehab Potential  motivated, good results from previous therapy    PT Frequency  2x / week    PT Duration  8 weeks    PT Treatment/Interventions  Aquatic Therapy;Cryotherapy;Electrical Stimulation;Moist Heat;Gait training;Stair training;Functional mobility training;Therapeutic activities;Therapeutic exercise;Balance training;Neuromuscular re-education;Patient/family education;Manual techniques;Passive range of motion;Energy conservation;ADLs/Self Care Home Management;Canalith Repostioning;Iontophoresis 108m/ml Dexamethasone;Traction;Ultrasound;DME Instruction;Vestibular    PT Next Visit Plan  Update additional outcome measures/goals, initiate HEP, work on balance    PT Home Exercise Plan  see pt instructions, medbridge code: [email or web address]    Consulted and Agree with Plan of Care  Patient  Patient will benefit from skilled therapeutic intervention in order to improve the following deficits and impairments:  Abnormal gait, Decreased endurance, Hypomobility, Cardiopulmonary status limiting activity, Decreased activity tolerance, Decreased strength, Pain, Difficulty walking, Decreased mobility, Decreased balance, Postural dysfunction, Decreased safety awareness  Visit Diagnosis: Muscle weakness (generalized) - Plan: PT plan of care cert/re-cert  Unsteadiness on feet - Plan: PT plan of care cert/re-cert  Dizziness and giddiness - Plan: PT plan of care cert/re-cert     Problem List Patient Active Problem List   Diagnosis Date Noted  . Urinary incontinence 03/15/2018  . Falls 03/15/2018  . Uterine cancer ([place]) 01/22/2018  . Prediabetes  10/14/2017  . Hypothyroidism 04/07/2015  . Vitamin D deficiency 04/02/2015  . Macrocytosis 03/28/2015  . Other fatigue 11/25/2014  . Advanced care planning/counseling discussion 03/15/2014  . Health maintenance examination 03/15/2014  . Postsurgical menopause 08/07/2011  . Medicare annual wellness visit, subsequent 08/06/2011  . Osteoarthritis 06/07/2009  . Narcolepsy without cataplexy 05/31/2008  . HLD (hyperlipidemia) 06/03/2007  . MDD (major depressive disorder), recurrent episode, severe ([place]) 06/03/2007  . GERD with stricture 06/03/2007   Phillips Grout PT, DPT, GCS  , 06/03/2018, 4:40 PM  [place] MAIN [facility] SERVICES [address] [place], [place], [address] Phone: [phone]   Fax:  [phone]  Name: Faith Santiago MRN: [id number] Date of Birth: 06/04/1940

## 2018-06-03 ENCOUNTER — Ambulatory Visit: Payer: Self-pay | Admitting: Physical Therapy

## 2018-06-04 ENCOUNTER — Ambulatory Visit: Payer: Medicare Other

## 2018-06-04 VITALS — HR 103

## 2018-06-04 DIAGNOSIS — M6281 Muscle weakness (generalized): Secondary | ICD-10-CM | POA: Diagnosis not present

## 2018-06-04 DIAGNOSIS — R42 Dizziness and giddiness: Secondary | ICD-10-CM

## 2018-06-04 DIAGNOSIS — R2681 Unsteadiness on feet: Secondary | ICD-10-CM

## 2018-06-04 NOTE — Therapy (Signed)
[place] MAIN [facility] SERVICES [address] [place], [place], [address] Phone: [phone]   Fax:  [phone]  Physical Therapy Treatment  Patient Details  Name: Faith Santiago MRN: [id number] Date of Birth: [birth year] Referring Provider (PT): Dr. Danise Mina   Encounter Date: 06/04/2018  PT End of Session - 06/04/18 1439    Visit Number  7    Number of Visits  17    Date for PT Re-Evaluation  06/24/18    Authorization Type  eval 04/29/18, goals updated 05/28/18    PT Start Time  1438    PT Stop Time  1515    PT Time Calculation (min)  37 min    Equipment Utilized During Treatment  Gait belt    Activity Tolerance  Patient tolerated treatment well    Behavior During Therapy  [facility] for tasks assessed/performed       Past Medical History:  Diagnosis Date  . Anemia, unspecified   . Anxiety   . Arthritis    hands R>L, neck and lower back  . Bimalleolar fracture of left ankle 11/19/2013   [place]  . Cancer ([place])   . Cataract   . Depression   . Esophageal reflux   . GERD with stricture 06/03/2007  . Glaucoma   . History of osteopenia    DEXA WNL 2008, 2015  . History of uterine cancer 1999   s/p hysterectomy  . HLD (hyperlipidemia)   . Narcolepsy   . Thyroid disease     Past Surgical History:  Procedure Laterality Date  . bilateral catarct removal  2010  . COLONOSCOPY  05/2003   WNL (Dr Velora Heckler)  . COLONOSCOPY  11/2017   3 small TA, diverticulisis, no f/u needed Carlean Purl)  . DEXA  03/2014   WNL T score +3.9  . ESOPHAGOGASTRODUODENOSCOPY  11/2017   dilated esoph stenosis, 8cm HH (Gessner)  . ESOPHAGOGASTRODUODENOSCOPY  12/2017   repeat dilation of esoph stenosis, 5cm HH - rec stay on PPI Carlean Purl)  . ORIF ANKLE FRACTURE Left 11/19/2013   Procedure: LEFT ANKLE FRACTURE OPEN TREATMENT BIMALLEOLAR ANKLE INCLUDES INTERNAL FIXATION ;  Surgeon: Johnny Bridge, MD  . RETINAL DETACHMENT REPAIR W/ SCLERAL BUCKLE LE Right 03/21/2016  . TOTAL  ABDOMINAL HYSTERECTOMY  1999   uterine cancer    Vitals:   06/04/18 1441  Pulse: (!) 103    Subjective Assessment - 06/04/18 1439    Subjective  Patient has no complaints at start of session. No falls. No low back pain currently. No specific questions or concerns    Pertinent History  78 yo Female with history of low back pain, reports impaired balance and increased fear of falling; Reports increased history of severe falls. Her last fall occurred in Sept 2019, when she tripped over a floor transition; at that time she was unable to get back up; She reports suffering a bruise as a result of fall; Patient is currently not using any assistive device; She reports that she doesn't do a lot of walking but recently took a trip where she had to walk for long distances. She reports having to take increased rest breaks due to fatigue and back pain; Denies any LE pain;     Limitations  Standing;Walking    How long can you sit comfortably?  NA    How long can you stand comfortably?  increased pain with prolonged standing >30 min    How long can you walk comfortably?  about 500  feet with fatigue/back pain;     Diagnostic tests  none recent    Patient Stated Goals  walk farther, reduce fall risk;     Currently in Pain?  No/denies           TREATMENT   Canalith Repositioning Treatment Dix-Hallpike performed again today and remains negative on the right side. Positive Dix-Hallpike test on the left side for faint upbeating L torsional nystagmus, 8-10 beats and fatigues after approximately 10-12 seconds. Pt denies any vertigo today. Pt taken through 2 bouts of CRT with retesting between maneuvers. 1 minute holds in each position. After first CRT Dix-Hallpike test is negative during retesting for either vertigo or nystagmus. Pt taken through second repositioning treatment to ensure full clearance of otoconia. After second bout retested with IR goggles and it is negative on the left side. Decided to retest  the right side with IR goggles and it is positive for upbeating R torsional nystagmus of 10-15 beats. No concurrent vertigo reported. Retested R Dix-Hallpike in room light and nystagmus is observable but faint. Performed horizontal canal testing with IR goggles which is negative bilaterally. Will retest R Dix-Hallpike with IR goggles at next visit and treat posterior canal as indicated.                          PT Short Term Goals - 06/03/18 1629      PT SHORT TERM GOAL #1   Title  Patient will be adherent to HEP at least 3x a week to improve functional strength and balance for better safety at home.    Time  4    Period  Weeks    Status  On-going    Target Date  05/27/18      PT SHORT TERM GOAL #2   Title  Patient will increase six minute walk test distance to >1300 for progression to closer to age group norms and improve functional gait distance;     Baseline  05/28/18: 1000'    Time  4    Period  Weeks    Status  Partially Met    Target Date  05/27/18        PT Long Term Goals - 06/03/18 1625      PT LONG TERM GOAL #1   Title  patient will improve mini best test to >20/28 to indicate reduced fall risk and improved balance ability;     Time  8    Period  Weeks    Status  New    Target Date  06/24/18      PT LONG TERM GOAL #2   Title  Patient will report a worst pain of 3/10 on VAS in  low back    to improve tolerance with ADLs and reduced symptoms with activities.     Time  8    Period  Weeks    Status  New    Target Date  06/24/18      PT LONG TERM GOAL #3   Title  Patient will increase BLE gross strength to 4+/5 as to improve functional strength for independent gait, increased standing tolerance and increased ADL ability.    Time  8    Period  Weeks    Status  New    Target Date  06/24/18      PT LONG TERM GOAL #4   Title  Pt will improve DGI by at least 3 points in order to  demonstrate clinically significant improvement in balance and decreased  risk for falls     Baseline  06/02/18: 17/24    Time  8    Period  Weeks    Status  New    Target Date  06/24/18      PT LONG TERM GOAL #5   Title  --    Baseline  --    Time  8    Period  --    Status  --    Target Date  --            Plan - 06/04/18 1440    Clinical Impression Statement  Dix-Hallpike performed again today and remains negative on the right side. Positive Dix-Hallpike test on the left side for faint upbeating L torsional nystagmus, 8-10 beats and fatigues after approximately 10-12 seconds. Pt denies any vertigo today. Pt taken through 2 bouts of CRT with retesting between maneuvers. 1 minute holds in each position. After first CRT Dix-Hallpike test is negative during retesting for either vertigo or nystagmus. Pt taken through second repositioning treatment to ensure full clearance of otoconia. After second bout retested with IR goggles and it is negative on the left side. Decided to retest the right side with IR goggles and it is positive for upbeating R torsional nystagmus of 10-15 beats. No concurrent vertigo reported. Retested R Dix-Hallpike in room light and nystagmus is observable but faint. Performed horizontal canal testing with IR goggles which is negative bilaterally. Will retest R Dix-Hallpike with IR goggles at next visit and treat posterior canal as indicated.     Rehab Potential  Good    Clinical Impairments Affecting Rehab Potential  motivated, good results from previous therapy    PT Frequency  2x / week    PT Duration  8 weeks    PT Treatment/Interventions  Aquatic Therapy;Cryotherapy;Electrical Stimulation;Moist Heat;Gait training;Stair training;Functional mobility training;Therapeutic activities;Therapeutic exercise;Balance training;Neuromuscular re-education;Patient/family education;Manual techniques;Passive range of motion;Energy conservation;ADLs/Self Care Home Management;Canalith Repostioning;Iontophoresis 67m/ml Dexamethasone;Traction;Ultrasound;DME  Instruction;Vestibular    PT Next Visit Plan  Retest R posterior canal with Dix-Hallpike using IR goggles, update additional outcome measures/goals, initiate HEP, work on balance    PT Home Exercise Plan  see pt instructions, medbridge code: [email or web address]    Consulted and Agree with Plan of Care  Patient       Patient will benefit from skilled therapeutic intervention in order to improve the following deficits and impairments:  Abnormal gait, Decreased endurance, Hypomobility, Cardiopulmonary status limiting activity, Decreased activity tolerance, Decreased strength, Pain, Difficulty walking, Decreased mobility, Decreased balance, Postural dysfunction, Decreased safety awareness  Visit Diagnosis: Unsteadiness on feet  Dizziness and giddiness     Problem List Patient Active Problem List   Diagnosis Date Noted  . Urinary incontinence 03/15/2018  . Falls 03/15/2018  . Uterine cancer ([place] 01/22/2018  . Prediabetes 10/14/2017  . Hypothyroidism 04/07/2015  . Vitamin D deficiency 04/02/2015  . Macrocytosis 03/28/2015  . Other fatigue 11/25/2014  . Advanced care planning/counseling discussion 03/15/2014  . Health maintenance examination 03/15/2014  . Postsurgical menopause 08/07/2011  . Medicare annual wellness visit, subsequent 08/06/2011  . Osteoarthritis 06/07/2009  . Narcolepsy without cataplexy 05/31/2008  . HLD (hyperlipidemia) 06/03/2007  . MDD (major depressive disorder), [place] episode[place] 06/03/[place]  . GERD with stricture 06/03/2007   JPhillips GroutPT, DPT, GCS  Jordynn Perrier 06/04/2018, 3:35 PM  [place] [facility] [address] [address]: [phone]  Fax:  [phone]  Name: Faith Santiago MRN: [id number] Date of Birth: [birth year]

## 2018-06-08 ENCOUNTER — Ambulatory Visit: Payer: Self-pay | Admitting: Physical Therapy

## 2018-06-09 ENCOUNTER — Ambulatory Visit: Payer: Medicare Other | Admitting: Physical Therapy

## 2018-06-09 ENCOUNTER — Encounter: Payer: Self-pay | Admitting: Physical Therapy

## 2018-06-09 DIAGNOSIS — M6281 Muscle weakness (generalized): Secondary | ICD-10-CM

## 2018-06-09 DIAGNOSIS — R2689 Other abnormalities of gait and mobility: Secondary | ICD-10-CM

## 2018-06-09 DIAGNOSIS — Z9181 History of falling: Secondary | ICD-10-CM

## 2018-06-09 DIAGNOSIS — R2681 Unsteadiness on feet: Secondary | ICD-10-CM

## 2018-06-09 DIAGNOSIS — R278 Other lack of coordination: Secondary | ICD-10-CM

## 2018-06-09 DIAGNOSIS — R42 Dizziness and giddiness: Secondary | ICD-10-CM

## 2018-06-09 NOTE — Therapy (Signed)
[place] MAIN [facility] SERVICES [address] [place], [place], [address] Phone: [phone]   Fax:  [phone]  Physical Therapy Treatment  Patient Details  Name: Faith Santiago MRN: [id number] Date of Birth: December 01, 1940 Referring Provider (PT): Dr. Danise Mina   Encounter Date: 06/09/2018  PT End of Session - 06/09/18 1311    Visit Number  8    Number of Visits  17    Date for PT Re-Evaluation  06/24/18    Authorization Type  eval 04/29/18, goals updated 05/28/18    PT Start Time  0108    PT Stop Time  0146    PT Time Calculation (min)  38 min    Equipment Utilized During Treatment  Gait belt    Activity Tolerance  Patient tolerated treatment well    Behavior During Therapy  [facility] for tasks assessed/performed       Past Medical History:  Diagnosis Date  . Anemia, unspecified   . Anxiety   . Arthritis    hands R>L, neck and lower back  . Bimalleolar fracture of left ankle 11/19/2013   [place]  . Cancer ([place])   . Cataract   . Depression   . Esophageal reflux   . GERD with stricture 06/03/2007  . Glaucoma   . History of osteopenia    DEXA WNL 2008, 2015  . History of uterine cancer 1999   s/p hysterectomy  . HLD (hyperlipidemia)   . Narcolepsy   . Thyroid disease     Past Surgical History:  Procedure Laterality Date  . bilateral catarct removal  2010  . COLONOSCOPY  05/2003   WNL (Dr Velora Heckler)  . COLONOSCOPY  11/2017   3 small TA, diverticulisis, no f/u needed Carlean Purl)  . DEXA  03/2014   WNL T score +3.9  . ESOPHAGOGASTRODUODENOSCOPY  11/2017   dilated esoph stenosis, 8cm HH (Gessner)  . ESOPHAGOGASTRODUODENOSCOPY  12/2017   repeat dilation of esoph stenosis, 5cm HH - rec stay on PPI Carlean Purl)  . ORIF ANKLE FRACTURE Left 11/19/2013   Procedure: LEFT ANKLE FRACTURE OPEN TREATMENT BIMALLEOLAR ANKLE INCLUDES INTERNAL FIXATION ;  Surgeon: Johnny Bridge, MD  . RETINAL DETACHMENT REPAIR W/ SCLERAL BUCKLE LE Right 03/21/2016  . TOTAL  ABDOMINAL HYSTERECTOMY  1999   uterine cancer    There were no vitals filed for this visit.  Subjective Assessment - 06/09/18 1309    Subjective  Patient has no complaints at start of session. No falls. No low back pain currently. No specific questions or concerns    Pertinent History  78 yo Female with history of low back pain, reports impaired balance and increased fear of falling; Reports increased history of severe falls. Her last fall occurred in Sept 2019, when she tripped over a floor transition; at that time she was unable to get back up; She reports suffering a bruise as a result of fall; Patient is currently not using any assistive device; She reports that she doesn't do a lot of walking but recently took a trip where she had to walk for long distances. She reports having to take increased rest breaks due to fatigue and back pain; Denies any LE pain;     Limitations  Standing;Walking    How long can you sit comfortably?  NA    How long can you stand comfortably?  increased pain with prolonged standing >30 min    How long can you walk comfortably?  about 500 feet with fatigue/back  pain;     Diagnostic tests  none recent    Patient Stated Goals  walk farther, reduce fall risk;        Therapeutic Exercises:       Nustep level 3 x 5 mins with PT monitoring throughout, intervals for cardiovascular endurance, strengthening Seated Heel Toe Raises - 10 reps - 2 sets with demo and verbal cues for exercise technique Seated Long Arc Quad - 10 reps - 2 sets with demo and verbal cues for exercise technique Seated March - 10 reps - 2 sets with demo and verbal cues for exercise technique Sit to Stand with Arms Crossed - 10 reps - 2 sets from elevated surface x 10 x10  From slightly lowered surface from first set with demo and verbal cues for eccentric control Bridge - 10 reps - 2 sets with TA activation, with verbal and tactile cues hooklying hip abduction with RTB 2x10 with verbal/tactile cues  for form SLR x10 bilaterally with verbal/tactile cues for form  Neuromuscular re-edu: Toe taps to 4" step in // bars, CGA, able to progress for single UE to no UE support, verbal cues for knee flexion, upright posture Tandem standing on foam with trunk rotation left and right x 20 Sidestepping on blue balance foam x 10 feet x 5 repetitions  Step ups from 6 inch stool x 20 , CGA Step downs from 6 inch stool to foam x 20 with CGA       Step over hurdles fwd/backward/side to side x10 ea way bilaterally, CGA throughout, progressed for bilateral UE to no UE support, cues for upright posture, foot clearance, step length bilaterally                          PT Education - 06/09/18 1310    Education Details  HEP    Person(s) Educated  Patient    Methods  Explanation;Demonstration;Tactile cues    Comprehension  Verbalized understanding;Returned demonstration       PT Short Term Goals - 06/03/18 1629      PT SHORT TERM GOAL #1   Title  Patient will be adherent to HEP at least 3x a week to improve functional strength and balance for better safety at home.    Time  4    Period  Weeks    Status  On-going    Target Date  05/27/18      PT SHORT TERM GOAL #2   Title  Patient will increase six minute walk test distance to >1300 for progression to closer to age group norms and improve functional gait distance;     Baseline  05/28/18: 1000'    Time  4    Period  Weeks    Status  Partially Met    Target Date  05/27/18        PT Long Term Goals - 06/03/18 1625      PT LONG TERM GOAL #1   Title  patient will improve mini best test to >20/28 to indicate reduced fall risk and improved balance ability;     Time  8    Period  Weeks    Status  New    Target Date  06/24/18      PT LONG TERM GOAL #2   Title  Patient will report a worst pain of 3/10 on VAS in  low back    to improve tolerance with ADLs and reduced symptoms with activities.  Time  8    Period  Weeks     Status  New    Target Date  06/24/18      PT LONG TERM GOAL #3   Title  Patient will increase BLE gross strength to 4+/5 as to improve functional strength for independent gait, increased standing tolerance and increased ADL ability.    Time  8    Period  Weeks    Status  New    Target Date  06/24/18      PT LONG TERM GOAL #4   Title  Pt will improve DGI by at least 3 points in order to demonstrate clinically significant improvement in balance and decreased risk for falls     Baseline  06/02/18: 17/24    Time  8    Period  Weeks    Status  New    Target Date  06/24/18      PT LONG TERM GOAL #5   Title  --    Baseline  --    Time  8    Period  --    Status  --    Target Date  --            Plan - 06/09/18 1313    Clinical Impression Statement  Pt presents with unsteadiness on uneven surfaces and fatigues with therapeutic exercises. Patient needs assist with instruction for balance with side stepping on uneven surfaces, and needs CGA assist with balance activities. Patient demonstrates difficulty with side stepping and navigating small spaces with decreased base of support and increased challenges for LE. Patient tolerated all interventions well this date and will benefit from continued skilled PT interventions to improve strength and balance and decrease risk of falling    Rehab Potential  Good    Clinical Impairments Affecting Rehab Potential  motivated, good results from previous therapy    PT Frequency  2x / week    PT Duration  8 weeks    PT Treatment/Interventions  Aquatic Therapy;Cryotherapy;Electrical Stimulation;Moist Heat;Gait training;Stair training;Functional mobility training;Therapeutic activities;Therapeutic exercise;Balance training;Neuromuscular re-education;Patient/family education;Manual techniques;Passive range of motion;Energy conservation;ADLs/Self Care Home Management;Canalith Repostioning;Iontophoresis 67m/ml Dexamethasone;Traction;Ultrasound;DME  Instruction;Vestibular    PT Next Visit Plan  Retest R posterior canal with Dix-Hallpike using IR goggles, update additional outcome measures/goals, initiate HEP, work on balance    PT Home Exercise Plan  see pt instructions, medbridge code: [email or web address]    Consulted and Agree with Plan of Care  Patient       Patient will benefit from skilled therapeutic intervention in order to improve the following deficits and impairments:  Abnormal gait, Decreased endurance, Hypomobility, Cardiopulmonary status limiting activity, Decreased activity tolerance, Decreased strength, Pain, Difficulty walking, Decreased mobility, Decreased balance, Postural dysfunction, Decreased safety awareness  Visit Diagnosis: Unsteadiness on feet  Dizziness and giddiness  Muscle weakness (generalized)  Other lack of coordination  At high risk for falls  Other abnormalities of gait and mobility     Problem List Patient Active Problem List   Diagnosis Date Noted  . Urinary incontinence 03/15/2018  . Falls 03/15/2018  . [place]terine cancer ([place] 01/22/2018  . Prediabetes 10/14/2017  . Hypothyroidism 04/07/2015  . Vitamin D deficiency 04/02/2015  . Macrocytosis 03/28/2015  . Other fatigue 11/25/2014  . Advanced care planning/counseling discussion 03/15/2014  . Health maintenance examination 03/15/2014  . Postsurgical menopause 08/07/2011  . Medicare annual wellness visit, subsequent 08/06/2011  . Osteoarthritis 06/07/2009  . Narcolepsy without cataplexy 05/31/2008  . HLD (hyperlipidemia) 06/03/2007  .  MDD (major depressive disorder), recurrent episode, severe ([place]) 06/03/2007  . GERD with stricture 06/03/2007    Alanson Puls , PT DPT 06/09/2018, 1:43 PM  [place] MAIN [facility] SERVICES [address] [place], [place], [address] Phone: [phone]   Fax:  [phone]  Name: ISELA STANTZ MRN: [id number] Date of Birth: 29-Jun-1940

## 2018-06-10 ENCOUNTER — Ambulatory Visit: Payer: Self-pay | Admitting: Physical Therapy

## 2018-06-11 ENCOUNTER — Ambulatory Visit: Payer: Medicare Other | Admitting: Physical Therapy

## 2018-06-15 ENCOUNTER — Ambulatory Visit: Payer: Medicare Other | Attending: Internal Medicine | Admitting: Physical Therapy

## 2018-06-15 ENCOUNTER — Ambulatory Visit: Payer: Self-pay | Admitting: Physical Therapy

## 2018-06-15 ENCOUNTER — Encounter: Payer: Self-pay | Admitting: Physical Therapy

## 2018-06-15 DIAGNOSIS — R2689 Other abnormalities of gait and mobility: Secondary | ICD-10-CM | POA: Insufficient documentation

## 2018-06-15 DIAGNOSIS — R2681 Unsteadiness on feet: Secondary | ICD-10-CM | POA: Insufficient documentation

## 2018-06-15 DIAGNOSIS — M545 Low back pain: Secondary | ICD-10-CM | POA: Diagnosis present

## 2018-06-15 DIAGNOSIS — G8929 Other chronic pain: Secondary | ICD-10-CM | POA: Diagnosis present

## 2018-06-15 DIAGNOSIS — M791 Myalgia, unspecified site: Secondary | ICD-10-CM | POA: Insufficient documentation

## 2018-06-15 DIAGNOSIS — R278 Other lack of coordination: Secondary | ICD-10-CM

## 2018-06-15 DIAGNOSIS — R42 Dizziness and giddiness: Secondary | ICD-10-CM | POA: Diagnosis present

## 2018-06-15 DIAGNOSIS — Z9181 History of falling: Secondary | ICD-10-CM | POA: Insufficient documentation

## 2018-06-15 DIAGNOSIS — M6281 Muscle weakness (generalized): Secondary | ICD-10-CM | POA: Insufficient documentation

## 2018-06-15 NOTE — Therapy (Signed)
[place] MAIN [facility] SERVICES [place], [place], [address] Phone: [phone]   Fax:  [phone]  Physical Therapy Treatment  Patient Details  Name: Faith Santiago MRN: [id number] Date of Birth: 1941-02-06 Referring Provider (PT): Dr. Danise Mina   Encounter Date: 06/15/2018  PT End of Session - 06/15/18 1400    Visit Number  9    Number of Visits  17    Date for PT Re-Evaluation  06/24/18    Authorization Type  eval 04/29/18, goals updated 05/28/18    PT Start Time  0300    PT Stop Time  0345    PT Time Calculation (min)  45 min    Equipment Utilized During Treatment  Gait belt    Activity Tolerance  Patient tolerated treatment well    Behavior During Therapy  [facility] for tasks assessed/performed       Past Medical History:  Diagnosis Date  . Anemia, unspecified   . Anxiety   . Arthritis    hands R>L, neck and lower back  . Bimalleolar fracture of left ankle 11/19/2013   [place]  . Cancer ([place])   . Cataract   . Depression   . Esophageal reflux   . GERD with stricture 06/03/2007  . Glaucoma   . History of osteopenia    DEXA WNL 2008, 2015  . History of uterine cancer 1999   s/p hysterectomy  . HLD (hyperlipidemia)   . Narcolepsy   . Thyroid disease     Past Surgical History:  Procedure Laterality Date  . bilateral catarct removal  2010  . COLONOSCOPY  05/2003   WNL (Dr Velora Heckler)  . COLONOSCOPY  11/2017   3 small TA, diverticulisis, no f/u needed Carlean Purl)  . DEXA  03/2014   WNL T score +3.9  . ESOPHAGOGASTRODUODENOSCOPY  11/2017   dilated esoph stenosis, 8cm HH (Gessner)  . ESOPHAGOGASTRODUODENOSCOPY  12/2017   repeat dilation of esoph stenosis, 5cm HH - rec stay on PPI Carlean Purl)  . ORIF ANKLE FRACTURE Left 11/19/2013   Procedure: LEFT ANKLE FRACTURE OPEN TREATMENT BIMALLEOLAR ANKLE INCLUDES INTERNAL FIXATION ;  Surgeon: Johnny Bridge, MD  . RETINAL DETACHMENT REPAIR W/ SCLERAL BUCKLE LE Right 03/21/2016  . TOTAL  ABDOMINAL HYSTERECTOMY  1999   uterine cancer    There were no vitals filed for this visit.  Subjective Assessment - 06/15/18 1353    Subjective  Patient has no complaints at start of session. No falls. No low back pain currently. No specific questions or concerns    Pertinent History  78 yo Female with history of low back pain, reports impaired balance and increased fear of falling; Reports increased history of severe falls. Her last fall occurred in Sept 2019, when she tripped over a floor transition; at that time she was unable to get back up; She reports suffering a bruise as a result of fall; Patient is currently not using any assistive device; She reports that she doesn't do a lot of walking but recently took a trip where she had to walk for long distances. She reports having to take increased rest breaks due to fatigue and back pain; Denies any LE pain;     Limitations  Standing;Walking    How long can you sit comfortably?  NA    How long can you stand comfortably?  increased pain with prolonged standing >30 min    How long can you walk comfortably?  about 500 feet with fatigue/back  pain;     Diagnostic tests  none recent    Patient Stated Goals  walk farther, reduce fall risk;        Treatment:  TM walking . 7 mile / hour x 5 mins   Leg press 90 lbs x 20 x 2   Heel raises x 20     Neuromuscular Re-education   Normal stance on Airex pad 2 x60 sec ?  Forward and backward stepping over hurdle x15 each direction   toe tapping 6 inch stool without UE assist x 20 , cues for technique   Side stepping  gait in // bars x 4 laps , posture correction cues   Side stepping on blue  foam balance beam x 5 lengths of the parallel bars with posture correction cues   Tandem standing on 1/2 foam with flat side up and cone stacking on stool with need of min assist x 20 cones x 2 sets    CGA and Min to mod verbal cues used throughout with increased in postural sway and LOB most seen  with narrow base of support and while on uneven surfaces. Continues to have balance deficits typical with diagnosis. Patient performs intermediate level exercises without pain behaviors and needs verbal cuing for postural alignment                PT Education - 06/15/18 1359    Education Details  HEP    Person(s) Educated  Patient    Methods  Explanation    Comprehension  Verbalized understanding;Returned demonstration       PT Short Term Goals - 06/03/18 1629      PT SHORT TERM GOAL #1   Title  Patient will be adherent to HEP at least 3x a week to improve functional strength and balance for better safety at home.    Time  4    Period  Weeks    Status  On-going    Target Date  05/27/18      PT SHORT TERM GOAL #2   Title  Patient will increase six minute walk test distance to >1300 for progression to closer to age group norms and improve functional gait distance;     Baseline  05/28/18: 1000'    Time  4    Period  Weeks    Status  Partially Met    Target Date  05/27/18        PT Long Term Goals - 06/03/18 1625      PT LONG TERM GOAL #1   Title  patient will improve mini best test to >20/28 to indicate reduced fall risk and improved balance ability;     Time  8    Period  Weeks    Status  New    Target Date  06/24/18      PT LONG TERM GOAL #2   Title  Patient will report a worst pain of 3/10 on VAS in  low back    to improve tolerance with ADLs and reduced symptoms with activities.     Time  8    Period  Weeks    Status  New    Target Date  06/24/18      PT LONG TERM GOAL #3   Title  Patient will increase BLE gross strength to 4+/5 as to improve functional strength for independent gait, increased standing tolerance and increased ADL ability.    Time  8    Period  Weeks  Status  New    Target Date  06/24/18      PT LONG TERM GOAL #4   Title  Pt will improve DGI by at least 3 points in order to demonstrate clinically significant improvement in balance  and decreased risk for falls     Baseline  06/02/18: 17/24    Time  8    Period  Weeks    Status  New    Target Date  06/24/18      PT LONG TERM GOAL #5   Title  --    Baseline  --    Time  8    Period  --    Status  --    Target Date  --            Plan - 06/15/18 1626    Clinical Impression Statement  Pt responded well to all interventions today.  Focus on LE strengthening in order to improve balance and ambulation.  Pt was able to complete LE exercises, requiring cueing for proper form.  Pt would continue to benefit from skilled therapy services in order to improve LE strength, gait and functional mobility in order to decrease risk of falls and improve independence with mobility .   Rehab Potential  Good    Clinical Impairments Affecting Rehab Potential  motivated, good results from previous therapy    PT Frequency  2x / week    PT Duration  8 weeks    PT Treatment/Interventions  Aquatic Therapy;Cryotherapy;Electrical Stimulation;Moist Heat;Gait training;Stair training;Functional mobility training;Therapeutic activities;Therapeutic exercise;Balance training;Neuromuscular re-education;Patient/family education;Manual techniques;Passive range of motion;Energy conservation;ADLs/Self Care Home Management;Canalith Repostioning;Iontophoresis 79m/ml Dexamethasone;Traction;Ultrasound;DME Instruction;Vestibular    PT Next Visit Plan  Retest R posterior canal with Dix-Hallpike using IR goggles, update additional outcome measures/goals, initiate HEP, work on balance    PT Home Exercise Plan  see pt instructions, medbridge code: [email or web address]    Consulted and Agree with Plan of Care  Patient       Patient will benefit from skilled therapeutic intervention in order to improve the following deficits and impairments:  Abnormal gait, Decreased endurance, Hypomobility, Cardiopulmonary status limiting activity, Decreased activity tolerance, Decreased strength, Pain, Difficulty walking, Decreased  mobility, Decreased balance, Postural dysfunction, Decreased safety awareness  Visit Diagnosis: Unsteadiness on feet  Dizziness and giddiness  Muscle weakness (generalized)  Other lack of coordination  At high risk for falls  Other abnormalities of gait and mobility     Problem List Patient Active Problem List   Diagnosis Date Noted  . Urinary incontinence 03/15/2018  . Falls 03/15/2018  . [place]terine cancer ([place] 01/22/2018  . Prediabetes 10/14/2017  . Hypothyroidism 04/07/2015  . Vitamin D deficiency 04/02/2015  . Macrocytosis 03/28/2015  . Other fatigue 11/25/2014  . Advanced care planning/counseling discussion 03/15/2014  . Health maintenance examination 03/15/2014  . Postsurgical menopause 08/07/2011  . Medicare annual wellness visit, subsequent 08/06/2011  . Osteoarthritis 06/07/2009  . Narcolepsy without cataplexy 05/31/2008  . HLD (hyperlipidemia) 06/03/2007  . MDD (major depressive disorder), recurrent episode, severe ([place] 06/03/2007  . GERD with stricture 06/03/2007    MAlanson Puls PT DPT 06/15/2018, 4:33 PM  [place] [facility] [address] [address]Phone: [phone]  Fax:  [phone] Name: DMAYSIE PARKHILLMRN: [id number] of Birth: 604-Apr-1942

## 2018-06-16 ENCOUNTER — Ambulatory Visit: Payer: Medicare Other

## 2018-06-17 ENCOUNTER — Ambulatory Visit: Payer: Self-pay | Admitting: Physical Therapy

## 2018-06-17 ENCOUNTER — Encounter: Payer: Self-pay | Admitting: Physical Therapy

## 2018-06-17 ENCOUNTER — Ambulatory Visit: Payer: Medicare Other | Admitting: Physical Therapy

## 2018-06-17 DIAGNOSIS — M545 Low back pain: Secondary | ICD-10-CM

## 2018-06-17 DIAGNOSIS — R2689 Other abnormalities of gait and mobility: Secondary | ICD-10-CM

## 2018-06-17 DIAGNOSIS — R2681 Unsteadiness on feet: Secondary | ICD-10-CM

## 2018-06-17 DIAGNOSIS — R42 Dizziness and giddiness: Secondary | ICD-10-CM

## 2018-06-17 DIAGNOSIS — M791 Myalgia, unspecified site: Secondary | ICD-10-CM

## 2018-06-17 DIAGNOSIS — R278 Other lack of coordination: Secondary | ICD-10-CM

## 2018-06-17 DIAGNOSIS — G8929 Other chronic pain: Secondary | ICD-10-CM

## 2018-06-17 DIAGNOSIS — Z9181 History of falling: Secondary | ICD-10-CM

## 2018-06-17 DIAGNOSIS — M6281 Muscle weakness (generalized): Secondary | ICD-10-CM

## 2018-06-17 NOTE — Therapy (Signed)
[place] MAIN [facility] SERVICES [place], [place], [address] Phone: [phone]   Fax:  [phone]  Physical Therapy Treatment/ Progress Note Physical Therapy Progress Note   Dates of reporting period 1/15/2 to 06/17/18  Patient Details  Name: Faith Santiago MRN: [id number] Date of Birth: 10/31/1940 Referring Provider (PT): Dr. Danise Mina   Encounter Date: 06/17/2018  PT End of Session - 06/17/18 1534    Visit Number  10    Number of Visits  17    Date for PT Re-Evaluation  06/24/18    Authorization Type  eval 04/29/18, goals updated 05/28/18    PT Start Time  0100    PT Stop Time  0145    PT Time Calculation (min)  45 min    Equipment Utilized During Treatment  Gait belt    Activity Tolerance  Patient tolerated treatment well    Behavior During Therapy  [facility] for tasks assessed/performed       Past Medical History:  Diagnosis Date  . Anemia, unspecified   . Anxiety   . Arthritis    hands R>L, neck and lower back  . Bimalleolar fracture of left ankle 11/19/2013   [place]  . Cancer ([place])   . Cataract   . Depression   . Esophageal reflux   . GERD with stricture 06/03/2007  . Glaucoma   . History of osteopenia    DEXA WNL 2008, 2015  . History of uterine cancer 1999   s/p hysterectomy  . HLD (hyperlipidemia)   . Narcolepsy   . Thyroid disease     Past Surgical History:  Procedure Laterality Date  . bilateral catarct removal  2010  . COLONOSCOPY  05/2003   WNL (Dr Velora Heckler)  . COLONOSCOPY  11/2017   3 small TA, diverticulisis, no f/u needed Carlean Purl)  . DEXA  03/2014   WNL T score +3.9  . ESOPHAGOGASTRODUODENOSCOPY  11/2017   dilated esoph stenosis, 8cm HH (G[facility]ner)  . ESOPHAGOGASTRODUODENOSCOPY  12/2017   repeat dilation of esoph stenosis, 5cm HH - rec stay on PPI Carlean Purl)  . ORIF ANKLE FRACTURE Left 11/19/2013   Procedure: LEFT ANKLE FRACTURE OPEN TREATMENT BIMALLEOLAR ANKLE INCLUDES INTERNAL FIXATION ;  Surgeon:  Johnny Bridge, MD  . RETINAL DETACHMENT REPAIR W/ SCLERAL BUCKLE LE Right 03/21/2016  . TOTAL ABDOMINAL HYSTERECTOMY  1999   uterine cancer    There were no vitals filed for this visit.  Subjective Assessment - 06/17/18 1534    Subjective  Patient has no complaints at start of session. No falls. No low back pain currently. No specific questions or concerns    Pertinent History  78 yo Female with history of low back pain, reports impaired balance and increased fear of falling; Reports increased history of severe falls. Her last fall occurred in Sept 2019, when she tripped over a floor transition; at that time she was unable to get back up; She reports suffering a bruise as a result of fall; Patient is currently not using any assistive device; She reports that she doesn't do a lot of walking but recently took a trip where she had to walk for long distances. She reports having to take increased rest breaks due to fatigue and back pain; Denies any LE pain;     Limitations  Standing;Walking    How long can you sit comfortably?  NA    How long can you stand comfortably?  increased pain with prolonged standing >30 min  How long can you walk comfortably?  about 500 feet with fatigue/back pain;     Diagnostic tests  none recent    Patient Stated Goals  walk farther, reduce fall risk;     Currently in Pain?  No/denies    Pain Score  0-No pain      Treatment: Goals reviewed with DGI, pain, and strength assessed.   Treatment: Sidestepping on blue balance foam x 10 feet x 5 repetitions  Step ups from 6 inch stool x 20 , CGA Step downs from 6 inch stool to foam x 20 with CGA       Step over hurdles fwd/backward/side to side x10 ea way bilaterally, CGA throughout, progressed for bilateral UE to no UE support, cues for upright posture, foot clearance, step length bilaterally   CGA and Min to mod verbal cues used throughout with increased in postural sway and LOB most seen with narrow base of  support and while on uneven surfaces. Continues to have balance deficits typical with diagnosis. Patient performs intermediate level exercises without pain behaviors and needs verbal cuing for postural alignment and head positioning                      PT Education - 06/17/18 1534    Education Details  HEP    Person(s) Educated  Patient    Methods  Explanation    Comprehension  Verbalized understanding;Need further instruction       PT Short Term Goals - 06/03/18 1629      PT SHORT TERM GOAL #1   Title  Patient will be adherent to HEP at least 3x a week to improve functional strength and balance for better safety at home.    Time  4    Period  Weeks    Status  On-going    Target Date  05/27/18      PT SHORT TERM GOAL #2   Title  Patient will increase six minute walk test distance to >1300 for progression to closer to age group norms and improve functional gait distance;     Baseline  05/28/18: 1000'    Time  4    Period  Weeks    Status  Partially Met    Target Date  05/27/18        PT Long Term Goals - 06/17/18 1535      PT LONG TERM GOAL #1   Title  patient will improve mini best test to >20/28 to indicate reduced fall risk and improved balance ability;     Baseline  Patient is uncomfortabale with parts of the test and therefore the test was not given    Time  8    Period  Weeks    Status  Deferred      PT LONG TERM GOAL #2   Title  Patient will report a worst pain of 3/10 on VAS in  low back    to improve tolerance with ADLs and reduced symptoms with activities.     Time  8    Period  Weeks    Status  Partially Met    Target Date  06/17/18      PT LONG TERM GOAL #3   Title  Patient will increase BLE gross strength to 4+/5 as to improve functional strength for independent gait, increased standing tolerance and increased ADL ability.    Time  8    Period  Weeks    Status  Partially Met    Target Date  06/17/18      PT LONG TERM GOAL #4   Title   Pt will improve DGI by at least 3 points in order to demonstrate clinically significant improvement in balance and decreased risk for falls     Baseline  06/02/18: 17/24    Time  8    Period  Weeks    Status  Partially Met    Target Date  06/17/18      PT LONG TERM GOAL #5   Time  8            Plan - 06/17/18 1537    Clinical Impression Statement  Patient's condition has the potential to improve in response to therapy. Maximum improvement is yet to be obtained. The anticipated improvement is attainable and reasonable in a generally predictable time.  Patient reports that she is not feeling as unsteady..  Patient has  slow gait speed without AD. She performed DGI and strength testing to review goals and balance and strength activities.  She will continue to benefit from skilled PT to work on strengthening and improving overall mobility .   Rehab Potential  Good    Clinical Impairments Affecting Rehab Potential  motivated, good results from previous therapy    PT Frequency  2x / week    PT Duration  8 weeks    PT Treatment/Interventions  Aquatic Therapy;Cryotherapy;Electrical Stimulation;Moist Heat;Gait training;Stair training;Functional mobility training;Therapeutic activities;Therapeutic exercise;Balance training;Neuromuscular re-education;Patient/family education;Manual techniques;Passive range of motion;Energy conservation;ADLs/Self Care Home Management;Canalith Repostioning;Iontophoresis 86m/ml Dexamethasone;Traction;Ultrasound;DME Instruction;Vestibular    PT Next Visit Plan  Retest R posterior canal with Dix-Hallpike using IR goggles, update additional outcome measures/goals, initiate HEP, work on balance    PT Home Exercise Plan  see pt instructions, medbridge code: [email or web address]    Consulted and Agree with Plan of Care  Patient       Patient will benefit from skilled therapeutic intervention in order to improve the following deficits and impairments:  Abnormal gait, Decreased  endurance, Hypomobility, Cardiopulmonary status limiting activity, Decreased activity tolerance, Decreased strength, Pain, Difficulty walking, Decreased mobility, Decreased balance, Postural dysfunction, Decreased safety awareness  Visit Diagnosis: Unsteadiness on feet  Dizziness and giddiness  Muscle weakness (generalized)  Other lack of coordination  At high risk for falls  Chronic bilateral low back pain without sciatica  Myalgia  Other abnormalities of gait and mobility     Problem List Patient Active Problem List   Diagnosis Date Noted  . Urinary incontinence 03/15/2018  . Falls 03/15/2018  . [place]ine cancer ([place] 01/22/[place]  . Prediabetes 10/14/2017  . Hypothyroidism 04/07/2015  . Vitamin D deficiency 04/02/2015  . Macrocytosis 03/28/2015  . Other fatigue 11/25/2014  . Advanced care planning/counseling discussion 03/15/2014  . Health maintenance examination 03/15/2014  . Postsurgical menopause 08/07/2011  . Medicare annual wellness visit, subsequent 08/06/2011  . Osteoarthritis 06/07/2009  . Narcolepsy without cataplexy 05/31/2008  . HLD (hyperlipidemia) 06/03/2007  . MDD (major depressive disorder), recurrent episode, severe ([place] 06/03/2007  . GERD with stricture 06/03/2007    MAlanson Puls PT DPT 06/17/2018, 3:38 PM  [facility] [facility] CommunitySERVICES [address] [address]Phone: [phone]  Fax:  [phone] Name: Faith NONAKAMRN: [id number] of Birth: [birth year]

## 2018-06-22 ENCOUNTER — Ambulatory Visit: Payer: Self-pay | Admitting: Physical Therapy

## 2018-06-23 ENCOUNTER — Ambulatory Visit: Payer: Medicare Other | Admitting: Physical Therapy

## 2018-06-23 DIAGNOSIS — R2681 Unsteadiness on feet: Secondary | ICD-10-CM

## 2018-06-23 DIAGNOSIS — R278 Other lack of coordination: Secondary | ICD-10-CM

## 2018-06-23 DIAGNOSIS — M6281 Muscle weakness (generalized): Secondary | ICD-10-CM

## 2018-06-23 DIAGNOSIS — Z9181 History of falling: Secondary | ICD-10-CM

## 2018-06-23 DIAGNOSIS — R42 Dizziness and giddiness: Secondary | ICD-10-CM

## 2018-06-23 NOTE — Therapy (Signed)
[place] MAIN [facility] SERVICES [address] [place], [place], [address] Phone: [phone]   Fax:  [phone]  Physical Therapy Treatment/ Recertification  Patient Details  Name: Faith Santiago MRN: [id number] Date of Birth: 04/22/1940 Referring Provider (PT): Dr. Danise Mina   Encounter Date: 06/23/2018  PT End of Session - 06/23/18 1159    Visit Number  11    Number of Visits  17    Date for PT Re-Evaluation  08/12/18    Authorization Type  eval 04/29/18, goals updated 05/28/18    PT Start Time  1118    PT Stop Time  1200    PT Time Calculation (min)  42 min    Equipment Utilized During Treatment  Gait belt    Activity Tolerance  Patient tolerated treatment well    Behavior During Therapy  [facility] for tasks assessed/performed       Past Medical History:  Diagnosis Date  . Anemia, unspecified   . Anxiety   . Arthritis    hands R>L, neck and lower back  . Bimalleolar fracture of left ankle 11/19/2013   [place]  . Cancer ([place])   . Cataract   . Depression   . Esophageal reflux   . GERD with stricture 06/03/2007  . Glaucoma   . History of osteopenia    DEXA WNL 2008, 2015  . History of uterine cancer 1999   s/p hysterectomy  . HLD (hyperlipidemia)   . Narcolepsy   . Thyroid disease     Past Surgical History:  Procedure Laterality Date  . bilateral catarct removal  2010  . COLONOSCOPY  05/2003   WNL (Dr Velora Heckler)  . COLONOSCOPY  11/2017   3 small TA, diverticulisis, no f/u needed Carlean Purl)  . DEXA  03/2014   WNL T score +3.9  . ESOPHAGOGASTRODUODENOSCOPY  11/2017   dilated esoph stenosis, 8cm HH (Gessner)  . ESOPHAGOGASTRODUODENOSCOPY  12/2017   repeat dilation of esoph stenosis, 5cm HH - rec stay on PPI Carlean Purl)  . ORIF ANKLE FRACTURE Left 11/19/2013   Procedure: LEFT ANKLE FRACTURE OPEN TREATMENT BIMALLEOLAR ANKLE INCLUDES INTERNAL FIXATION ;  Surgeon: Johnny Bridge, MD  . RETINAL DETACHMENT REPAIR W/ SCLERAL BUCKLE LE Right  03/21/2016  . TOTAL ABDOMINAL HYSTERECTOMY  1999   uterine cancer    There were no vitals filed for this visit.  Subjective Assessment - 06/23/18 1158    Subjective  Patient has no complaints at start of session. No falls. No low back pain currently. No specific questions or concerns    Pertinent History  78 yo Female with history of low back pain, reports impaired balance and increased fear of falling; Reports increased history of severe falls. Her last fall occurred in Sept 2019, when she tripped over a floor transition; at that time she was unable to get back up; She reports suffering a bruise as a result of fall; Patient is currently not using any assistive device; She reports that she doesn't do a lot of walking but recently took a trip where she had to walk for long distances. She reports having to take increased rest breaks due to fatigue and back pain; Denies any LE pain;     Limitations  Standing;Walking    How long can you sit comfortably?  NA    How long can you stand comfortably?  increased pain with prolonged standing >30 min    How long can you walk comfortably?  about 500 feet with  fatigue/back pain;     Diagnostic tests  none recent    Patient Stated Goals  walk farther, reduce fall risk;     Currently in Pain?  No/denies    Pain Score  0-No pain       Treatment: Patients goals were reviewed including pain goal, strength goal and DGI goal with progress made towards all goals  Patient has no reports of pain.   Neuromuscular education:  1/2 foam tandem standing with UE support x 2 mins 1/2 foam side to side standing with UE support  X 2 mins Side stepping on blue balance beam x 5 laps and UE support  Standing on foam and step stool with head turns x 20    CGA and Min to mod verbal cues used throughout with increased in postural sway and LOB most seen with narrow base of support and while on uneven surfaces. Continues to have balance deficits typical with diagnosis.  Patient performs intermediate level exercises without pain behaviors and needs verbal cuing for postural alignment and head positioning                     PT Education - 06/23/18 1159    Education Details  HEP    Person(s) Educated  Patient    Methods  Explanation    Comprehension  Verbalized understanding;Returned demonstration       PT Short Term Goals - 06/03/18 1629      PT SHORT TERM GOAL #1   Title  Patient will be adherent to HEP at least 3x a week to improve functional strength and balance for better safety at home.    Time  4    Period  Weeks    Status  On-going    Target Date  05/27/18      PT SHORT TERM GOAL #2   Title  Patient will increase six minute walk test distance to >1300 for progression to closer to age group norms and improve functional gait distance;     Baseline  05/28/18: 1000'    Time  4    Period  Weeks    Status  Partially Met    Target Date  05/27/18        PT Long Term Goals - 06/23/18 1202      PT LONG TERM GOAL #1   Title  patient will improve mini best test to >20/28 to indicate reduced fall risk and improved balance ability;     Baseline  Patient is uncomfortabale with parts of the test and therefore the test was not given    Time  8    Period  Weeks    Status  Unable to assess    Target Date  08/12/18      PT LONG TERM GOAL #2   Title  Patient will report a worst pain of 3/10 on VAS in  low back    to improve tolerance with ADLs and reduced symptoms with activities.     Baseline  06/23/18 no pain    Time  8    Period  Weeks    Status  Achieved    Target Date  08/12/18      PT LONG TERM GOAL #3   Title  Patient will increase BLE gross strength to 4+/5 as to improve functional strength for independent gait, increased standing tolerance and increased ADL ability.    Baseline  06/23/18= 4/5 BLE hip ext, hip abd, hip flex ,  4+/5 BLe knees, 4/5 BLE DF/PF    Time  8    Period  Weeks    Status  Partially Met    Target  Date  08/12/18      PT LONG TERM GOAL #4   Title  Pt will improve DGI by at least 3 points in order to demonstrate clinically significant improvement in balance and decreased risk for falls     Baseline  06/02/18: 17/24, 3/10 / 20= 18/24    Time  8    Period  Weeks    Status  Partially Met    Target Date  08/12/18      PT LONG TERM GOAL #5   Time  8            Plan - 06/23/18 1534    Clinical Impression Statement  Patient's condition has the potential to improve in response to therapy. Maximum improvement is yet to be obtained. The anticipated improvement is attainable and reasonable in a generally predictable time. Patient reports that she is not feeling as unsteady.. Patient has slow gait speed without AD. She performed DGI and strength testing to review goals and balance and strength activities. She will continue to benefit from skilled PT to work on strengthening and improving overall mobility    Rehab Potential  Good    Clinical Impairments Affecting Rehab Potential  motivated, good results from previous therapy    PT Frequency  2x / week    PT Duration  8 weeks    PT Treatment/Interventions  Aquatic Therapy;Cryotherapy;Electrical Stimulation;Moist Heat;Gait training;Stair training;Functional mobility training;Therapeutic activities;Therapeutic exercise;Balance training;Neuromuscular re-education;Patient/family education;Manual techniques;Passive range of motion;Energy conservation;ADLs/Self Care Home Management;Canalith Repostioning;Iontophoresis 51m/ml Dexamethasone;Traction;Ultrasound;DME Instruction;Vestibular    PT Next Visit Plan  Retest R posterior canal with Dix-Hallpike using IR goggles, update additional outcome measures/goals, initiate HEP, work on balance    PT Home Exercise Plan  see pt instructions, medbridge code: [email or web address]    Consulted and Agree with Plan of Care  Patient       Patient will benefit from skilled therapeutic intervention in order to improve the  following deficits and impairments:  Abnormal gait, Decreased endurance, Hypomobility, Cardiopulmonary status limiting activity, Decreased activity tolerance, Decreased strength, Pain, Difficulty walking, Decreased mobility, Decreased balance, Postural dysfunction, Decreased safety awareness  Visit Diagnosis: Unsteadiness on feet - Plan: PT plan of care cert/re-cert  Dizziness and giddiness - Plan: PT plan of care cert/re-cert  Muscle weakness (generalized) - Plan: PT plan of care cert/re-cert  Other lack of coordination - Plan: PT plan of care cert/re-cert  At high risk for falls - Plan: PT plan of care cert/re-cert     Problem List Patient Active Problem List   Diagnosis Date Noted  . Urinary incontinence 03/15/2018  . Falls 03/15/2018  . [place]ine cancer ([place] 01/22/2018  . Prediabetes 10/14/2017  . Hypothyroidism 04/07/2015  . Vitamin D deficiency 04/02/2015  . Macrocytosis 03/28/2015  . Other fatigue 11/25/2014  . Advanced care planning/counseling discussion 03/15/2014  . Health maintenance examination 03/15/2014  . Postsurgical menopause 08/07/2011  . Medicare annual wellness visit, subsequent 08/06/2011  . Osteoarthritis 06/07/2009  . Narcolepsy without cataplexy 05/31/2008  . HLD (hyperlipidemia) 06/03/2007  . MDD (major depressive disorder), recurrent episode, severe [place] 06/03/2007  . GERD with stricture 06/03/2007    MAlanson Puls PT DPT 06/23/2018, 3:48 PM  [place] [facility] [facility] [address] [address]Phone: [phone]  Fax:  [phone]  Name: Faith Santiago MRN: [id number] Date of Birth: October 20, 1940

## 2018-06-24 ENCOUNTER — Ambulatory Visit: Payer: Self-pay | Admitting: Physical Therapy

## 2018-06-25 ENCOUNTER — Ambulatory Visit: Payer: Medicare Other

## 2018-06-25 ENCOUNTER — Other Ambulatory Visit: Payer: Self-pay

## 2018-06-25 ENCOUNTER — Encounter: Payer: Self-pay | Admitting: Physical Therapy

## 2018-06-25 DIAGNOSIS — R42 Dizziness and giddiness: Secondary | ICD-10-CM

## 2018-06-25 DIAGNOSIS — M791 Myalgia, unspecified site: Secondary | ICD-10-CM

## 2018-06-25 DIAGNOSIS — M545 Low back pain, unspecified: Secondary | ICD-10-CM

## 2018-06-25 DIAGNOSIS — R2681 Unsteadiness on feet: Secondary | ICD-10-CM

## 2018-06-25 DIAGNOSIS — G8929 Other chronic pain: Secondary | ICD-10-CM

## 2018-06-25 DIAGNOSIS — R278 Other lack of coordination: Secondary | ICD-10-CM

## 2018-06-25 DIAGNOSIS — Z9181 History of falling: Secondary | ICD-10-CM

## 2018-06-25 DIAGNOSIS — R2689 Other abnormalities of gait and mobility: Secondary | ICD-10-CM

## 2018-06-25 DIAGNOSIS — M6281 Muscle weakness (generalized): Secondary | ICD-10-CM

## 2018-06-25 NOTE — Therapy (Signed)
[place] MAIN [facility] SERVICES [address] [place], [place], [address] Phone: [phone]   Fax:  [phone]  Physical Therapy Treatment  Patient Details  Name: Faith Santiago MRN: [id number] Date of Birth: August 06, 1940 Referring Provider (PT): Dr. Danise Mina   Encounter Date: 06/25/2018  PT End of Session - 06/25/18 1118    Visit Number  12    Number of Visits  17    Date for PT Re-Evaluation  08/12/18    Authorization Type  eval 04/29/18, goals updated 05/28/18    PT Start Time  1120    PT Stop Time  1200    PT Time Calculation (min)  40 min    Equipment Utilized During Treatment  Gait belt    Activity Tolerance  Patient tolerated treatment well    Behavior During Therapy  [facility] for tasks assessed/performed       Past Medical History:  Diagnosis Date  . Anemia, unspecified   . Anxiety   . Arthritis    hands R>L, neck and lower back  . Bimalleolar fracture of left ankle 11/19/2013   [place]  . Cancer ([place])   . Cataract   . Depression   . Esophageal reflux   . GERD with stricture 06/03/2007  . Glaucoma   . History of osteopenia    DEXA WNL 2008, 2015  . History of uterine cancer 1999   s/p hysterectomy  . HLD (hyperlipidemia)   . Narcolepsy   . Thyroid disease     Past Surgical History:  Procedure Laterality Date  . bilateral catarct removal  2010  . COLONOSCOPY  05/2003   WNL (Dr Velora Heckler)  . COLONOSCOPY  11/2017   3 small TA, diverticulisis, no f/u needed Carlean Purl)  . DEXA  03/2014   WNL T score +3.9  . ESOPHAGOGASTRODUODENOSCOPY  11/2017   dilated esoph stenosis, 8cm HH ([facility]ner)  . ESOPHAGOGASTRODUODENOSCOPY  12/2017   repeat dilation of esoph stenosis, 5cm HH - rec stay on PPI Carlean Purl)  . ORIF ANKLE FRACTURE Left 11/19/2013   Procedure: LEFT ANKLE FRACTURE OPEN TREATMENT BIMALLEOLAR ANKLE INCLUDES INTERNAL FIXATION ;  Surgeon: Johnny Bridge, MD  . RETINAL DETACHMENT REPAIR W/ SCLERAL BUCKLE LE Right 03/21/2016  .  TOTAL ABDOMINAL HYSTERECTOMY  1999   uterine cancer    There were no vitals filed for this visit.  Subjective Assessment - 06/25/18 1117    Subjective  Patient with no complaints, no recent falls, doing well overall. States therapy seems to be helping.    Pertinent History  78 yo Female with history of low back pain, reports impaired balance and increased fear of falling; Reports increased history of severe falls. Her last fall occurred in Sept 2019, when she tripped over a floor transition; at that time she was unable to get back up; She reports suffering a bruise as a result of fall; Patient is currently not using any assistive device; She reports that she doesn't do a lot of walking but recently took a trip where she had to walk for long distances. She reports having to take increased rest breaks due to fatigue and back pain; Denies any LE pain;     Limitations  Standing;Walking    How long can you sit comfortably?  NA    How long can you stand comfortably?  increased pain with prolonged standing >30 min    How long can you walk comfortably?  about 500 feet with fatigue/back pain;  Diagnostic tests  none recent    Patient Stated Goals  walk farther, reduce fall risk;     Currently in Pain?  No/denies    Pain Type  Chronic pain    Multiple Pain Sites  No      TREATMENT: Neuromuscular education:   Walking forward reading playing cards ~168f x2 lengths, cues to attend all directions/increase visual field scanning to be able to see all playing cards retroambulation ~236fwith CGA Tandem ambulation on firm surface ~2534f2 CGA Side stepping [id number] lengths with supervision, cues for exercise form Cones weaving step over 2 hurdles 6 rounds, cues for step length, step continuity, upright posture, and stepping strategies over hurdles, CGA-supervision. toe taps on blue airex to 4" step 2x10 with CGA, intermittent use of UE to address LOB Narrow base of support on foam vertical head turns  2x30sec Narrow base of support on foam horizontal turns 2x30secs Ball handing narrow stance on foam 2x10 variable positions second set foam tandem standing without UE support x 2 mins b/l Side stepping on blue foam balance beam x 5 lengths of the parallel bars with posture correction cues  Therapeutic exercises: Leg press: Bilateral 90# 2x20    Patient with good tolerance to activities today. Patient needed CGA for intermediate and higher level balance activities to ensure safety, especially with uneven surfaces. Patient most challenged by narrow base of support activities, cues for balance strategies throughout, and mod verbal cues to maintain proper exercise form/technique. The patient would benefit from further skilled PT to continue to progress towards goals, decrease risk of falls, and improve functional abilities.   PT Education - 06/25/18 1118    Education Details  exercise technique, form    Person(s) Educated  Patient    Methods  Explanation;Demonstration;Verbal cues    Comprehension  Verbalized understanding;Returned demonstration       PT Short Term Goals - 06/03/18 1629      PT SHORT TERM GOAL #1   Title  Patient will be adherent to HEP at least 3x a week to improve functional strength and balance for better safety at home.    Time  4    Period  Weeks    Status  On-going    Target Date  05/27/18      PT SHORT TERM GOAL #2   Title  Patient will increase six minute walk test distance to >1300 for progression to closer to age group norms and improve functional gait distance;     Baseline  05/28/18: 1000'    Time  4    Period  Weeks    Status  Partially Met    Target Date  05/27/18        PT Long Term Goals - 06/23/18 1202      PT LONG TERM GOAL #1   Title  patient will improve mini best test to >20/28 to indicate reduced fall risk and improved balance ability;     Baseline  Patient is uncomfortabale with parts of the test and therefore the test was not given     Time  8    Period  Weeks    Status  Unable to assess    Target Date  08/12/18      PT LONG TERM GOAL #2   Title  Patient will report a worst pain of 3/10 on VAS in  low back    to improve tolerance with ADLs and reduced symptoms with activities.  Baseline  06/23/18 no pain    Time  8    Period  Weeks    Status  Achieved    Target Date  08/12/18      PT LONG TERM GOAL #3   Title  Patient will increase BLE gross strength to 4+/5 as to improve functional strength for independent gait, increased standing tolerance and increased ADL ability.    Baseline  06/23/18= 4/5 BLE hip ext, hip abd, hip flex , 4+/5 BLe knees, 4/5 BLE DF/PF    Time  8    Period  Weeks    Status  Partially Met    Target Date  08/12/18      PT LONG TERM GOAL #4   Title  Pt will improve DGI by at least 3 points in order to demonstrate clinically significant improvement in balance and decreased risk for falls     Baseline  06/02/18: 17/24, 3/10 / 20= 18/24    Time  8    Period  Weeks    Status  Partially Met    Target Date  08/12/18      PT LONG TERM GOAL #5   Time  8            Plan - 06/25/18 1203    Clinical Impression Statement   Patient with good tolerance to activities today. Patient needed CGA for intermediate and higher level balance activities to ensure safety, especially with uneven surfaces. Patient most challenged by narrow base of support activities, cues for balance strategies throughout, and mod verbal cues to maintain proper exercise form/technique. The patient would benefit from further skilled PT to continue to progress towards goals, decrease risk of falls, and improve functional abilities.    Rehab Potential  Good    Clinical Impairments Affecting Rehab Potential  motivated, good results from previous therapy    PT Frequency  2x / week    PT Duration  8 weeks    PT Next Visit Plan  Retest R posterior canal with Dix-Hallpike using IR goggles, update additional outcome measures/goals,  initiate HEP, work on balance    PT Home Exercise Plan  see pt instructions, medbridge code: [email or web address]    Consulted and Agree with Plan of Care  Patient       Patient will benefit from skilled therapeutic intervention in order to improve the following deficits and impairments:  Abnormal gait, Decreased endurance, Hypomobility, Cardiopulmonary status limiting activity, Decreased activity tolerance, Decreased strength, Pain, Difficulty walking, Decreased mobility, Decreased balance, Postural dysfunction, Decreased safety awareness  Visit Diagnosis: Unsteadiness on feet  Chronic bilateral low back pain without sciatica  Dizziness and giddiness  Muscle weakness (generalized)  Other lack of coordination  At high risk for falls  Other abnormalities of gait and mobility  Myalgia     Problem List Patient Active Problem List   Diagnosis Date Noted  . Urinary incontinence 03/15/2018  . Falls 03/15/2018  . Uterine cancer ([place]) 01/22/2018  . Prediabetes 10/14/2017  . Hypothyroidism 04/07/2015  . Vitamin D deficiency 04/02/2015  . Macrocytosis 03/28/2015  . Other fatigue 11/25/2014  . Advanced care planning/counseling discussion 03/15/2014  . Health maintenance examination 03/15/2014  . Postsurgical menopause 08/07/2011  . Medicare annual wellness visit, subsequent 08/06/2011  . Osteoarthritis 06/07/2009  . Narcolepsy without cataplexy 05/31/2008  . HLD (hyperlipidemia) 06/03/2007  . MDD (major depressive disorder), recurrent episode, severe ([place]) 06/03/2007  . GERD with stricture 06/03/2007    Lieutenant Diego PT, DPT [phone] PM,06/25/18 [phone]  [place] MAIN [facility] SERVICES [address] [place], [place], [address] Phone: [phone]   Fax:  [phone]  Name: Faith Santiago MRN: [id number] Date of Birth: 1940/07/16

## 2018-06-29 ENCOUNTER — Ambulatory Visit: Payer: Self-pay | Admitting: Physical Therapy

## 2018-06-30 ENCOUNTER — Other Ambulatory Visit: Payer: Self-pay

## 2018-06-30 ENCOUNTER — Ambulatory Visit: Payer: Medicare Other

## 2018-06-30 DIAGNOSIS — R2681 Unsteadiness on feet: Secondary | ICD-10-CM

## 2018-06-30 DIAGNOSIS — M545 Low back pain, unspecified: Secondary | ICD-10-CM

## 2018-06-30 DIAGNOSIS — M6281 Muscle weakness (generalized): Secondary | ICD-10-CM

## 2018-06-30 DIAGNOSIS — R42 Dizziness and giddiness: Secondary | ICD-10-CM

## 2018-06-30 DIAGNOSIS — G8929 Other chronic pain: Secondary | ICD-10-CM

## 2018-06-30 NOTE — Therapy (Signed)
[place] MAIN [facility] SERVICES [address] [place], [place], [address] Phone: [phone]   Fax:  [phone]  Physical Therapy Treatment  Patient Details  Name: Faith Santiago MRN: [id number] Date of Birth: [birth year] Referring Provider (PT): Dr. Danise Mina   Encounter Date: 06/30/2018  PT End of Session - 06/30/18 1445    Visit Number  13    Number of Visits  17    Date for PT Re-Evaluation  08/12/18    Authorization Type  eval 04/29/18, goals updated 05/28/18    PT Start Time  1438    PT Stop Time  1518    PT Time Calculation (min)  40 min    Equipment Utilized During Treatment  Gait belt    Activity Tolerance  Patient tolerated treatment well    Behavior During Therapy  [employer] for tasks assessed/performed       Past Medical History:  Diagnosis Date  . Anemia, unspecified   . Anxiety   . Arthritis    hands R>L, neck and lower back  . Bimalleolar fracture of left ankle 11/19/2013   [place]  . Cancer ([place])   . Cataract   . Depression   . Esophageal reflux   . GERD with stricture 06/03/2007  . Glaucoma   . History of osteopenia    DEXA WNL 2008, 2015  . History of uterine cancer 1999   s/p hysterectomy  . HLD (hyperlipidemia)   . Narcolepsy   . Thyroid disease     Past Surgical History:  Procedure Laterality Date  . bilateral catarct removal  2010  . COLONOSCOPY  05/2003   WNL (Dr Velora Heckler)  . COLONOSCOPY  11/2017   3 small TA, diverticulisis, no f/u needed Carlean Purl)  . DEXA  03/2014   WNL T score +3.9  . ESOPHAGOGASTRODUODENOSCOPY  11/2017   dilated esoph stenosis, 8cm HH (Gessner)  . ESOPHAGOGASTRODUODENOSCOPY  12/2017   repeat dilation of esoph stenosis, 5cm HH - rec stay on PPI Carlean Purl)  . ORIF ANKLE FRACTURE Left 11/19/2013   Procedure: LEFT ANKLE FRACTURE OPEN TREATMENT BIMALLEOLAR ANKLE INCLUDES INTERNAL FIXATION ;  Surgeon: Johnny Bridge, MD  . RETINAL DETACHMENT REPAIR W/ SCLERAL BUCKLE LE Right 03/21/2016  .  TOTAL ABDOMINAL HYSTERECTOMY  1999   uterine cancer    There were no vitals filed for this visit.  Subjective Assessment - 06/30/18 1444    Subjective  Pt doing well. She reports she thinks her balance around her house has bene very good as of late. Prolonged walking still bothers her back, but she has been liited by recent social distancing.     Pertinent History  78 yo Female with history of low back pain, reports impaired balance and increased fear of falling; Reports increased history of severe falls. Her last fall occurred in Sept 2019, when she tripped over a floor transition; at that time she was unable to get back up; She reports suffering a bruise as a result of fall; Patient is currently not using any assistive device; She reports that she doesn't do a lot of walking but recently took a trip where she had to walk for long distances. She reports having to take increased rest breaks due to fatigue and back pain; Denies any LE pain;     Currently in Pain?  No/denies         INTERVENTION THIS DATE:  Neuromuscular education:  -Sidestepping on blue balance foam x 10 feet x 5 repetitions,  zero UE assist, in // bars for PRN self-assist  -Narrow base of support on foam vertical head turns 1x15, VC hold atop gaze, to allow for postural sway and reset -Narrow base of support on foam horizontal turns 1x10 bilat  -Concentric RetroAMB cable resisted: 1x75f c 12.5lbs, 1x456fc 17.5lbs  -Lateral side stepping: 1x2063filat c 12.5lb resistance (poor eccentric control Left)  -Lateral side stepping Left only 1x20f53f5lbs    Therapeutic exercises: -Bilateral Leg Press-> Bilateral 90# 1x20; 105lb 1x20 -Lateral Step up and over 1x10 bilat, 2" airex foam, no UE support  -Forward Step 3" foam pad hands free, 1x10 bilat          PT Short Term Goals - 06/03/18 1629      PT SHORT TERM GOAL #1   Title  Patient will be adherent to HEP at least 3x a week to improve functional strength and  balance for better safety at home.    Time  4    Period  Weeks    Status  On-going    Target Date  05/27/18      PT SHORT TERM GOAL #2   Title  Patient will increase six minute walk test distance to >1300 for progression to closer to age group norms and improve functional gait distance;     Baseline  05/28/18: 1000'    Time  4    Period  Weeks    Status  Partially Met    Target Date  05/27/18        PT Long Term Goals - 06/23/18 1202      PT LONG TERM GOAL #1   Title  patient will improve mini best test to >20/28 to indicate reduced fall risk and improved balance ability;     Baseline  Patient is uncomfortabale with parts of the test and therefore the test was not given    Time  8    Period  Weeks    Status  Unable to assess    Target Date  08/12/18      PT LONG TERM GOAL #2   Title  Patient will report a worst pain of 3/10 on VAS in  low back    to improve tolerance with ADLs and reduced symptoms with activities.     Baseline  06/23/18 no pain    Time  8    Period  Weeks    Status  Achieved    Target Date  08/12/18      PT LONG TERM GOAL #3   Title  Patient will increase BLE gross strength to 4+/5 as to improve functional strength for independent gait, increased standing tolerance and increased ADL ability.    Baseline  06/23/18= 4/5 BLE hip ext, hip abd, hip flex , 4+/5 BLe knees, 4/5 BLE DF/PF    Time  8    Period  Weeks    Status  Partially Met    Target Date  08/12/18      PT LONG TERM GOAL #4   Title  Pt will improve DGI by at least 3 points in order to demonstrate clinically significant improvement in balance and decreased risk for falls     Baseline  06/02/18: 17/24, 3/10 / 20= 18/24    Time  8    Period  Weeks    Status  Partially Met    Target Date  08/12/18      PT LONG TERM GOAL #5   Time  8  Plan - 06/30/18 1459    Clinical Impression Statement  Continued with plan of care as laid out in evaluation and additional prior episodes. Pt able  to perform session in full with intermittent rest breaks offered PRN. Pt requires very minimal verbal and tactile cues intermittently for instruction and correct form. Pt continues to demonstrate gradual progress toward goals AEB ability to tolerate progression in reps and/or sets this date. No significant LOB in most of session, hence pt encouraged to challenge herself at times and not move with exceedingly high levels of caution, so that she may practice LOB recovery.     Rehab Potential  Good    Clinical Impairments Affecting Rehab Potential  motivated, good results from previous therapy    PT Frequency  2x / week    PT Duration  8 weeks    PT Treatment/Interventions  Aquatic Therapy;Cryotherapy;Electrical Stimulation;Moist Heat;Gait training;Stair training;Functional mobility training;Therapeutic activities;Therapeutic exercise;Balance training;Neuromuscular re-education;Patient/family education;Manual techniques;Passive range of motion;Energy conservation;ADLs/Self Care Home Management;Canalith Repostioning;Iontophoresis 17m/ml Dexamethasone;Traction;Ultrasound;DME Instruction;Vestibular    PT Next Visit Plan  Retest R posterior canal with Dix-Hallpike using IR goggles, update additional outcome measures/goals, initiate HEP, work on balance    PT Home Exercise Plan  see pt instructions, medbridge code: [email or web address]    Consulted and Agree with Plan of Care  Patient       Patient will benefit from skilled therapeutic intervention in order to improve the following deficits and impairments:  Abnormal gait, Decreased endurance, Hypomobility, Cardiopulmonary status limiting activity, Decreased activity tolerance, Decreased strength, Pain, Difficulty walking, Decreased mobility, Decreased balance, Postural dysfunction, Decreased safety awareness  Visit Diagnosis: Unsteadiness on feet  Chronic bilateral low back pain without sciatica  Dizziness and giddiness  Muscle weakness  (generalized)     Problem List Patient Active Problem List   Diagnosis Date Noted  . Urinary incontinence 03/15/2018  . Falls 03/15/2018  . Uterine cancer ([place] 01/22/2018  . Prediabetes 10/14/2017  . Hypothyroidism 04/07/2015  . Vitamin D deficiency 04/02/2015  . Macrocytosis 03/28/2015  . Other fatigue 11/25/2014  . Advanced care planning/counseling discussion 03/15/2014  . Health maintenance examination 03/15/2014  . Postsurgical menopause 08/07/2011  . Medicare annual wellness visit, subsequent 08/06/2011  . Osteoarthritis 06/07/2009  . Narcolepsy without cataplexy 05/31/2008  . HLD (hyperlipidemia) 06/03/2007  . MDD (major depressive disorder), recurrent episode, severe ([place] 06/03/2007  . GERD with stricture 06/03/2007    3:18 PM, 06/30/18 AEtta Grandchild PT, DPT Physical Therapist - [phone]    BEtta Grandchild3/17/2020, 3:00 PM  [place] [facility] [address] [place] [address]Phone: [phone]  Fax:  [phone] Name: Faith GHRISTMRN: [id number] of Birth: [birth year]

## 2018-07-01 ENCOUNTER — Ambulatory Visit: Payer: Self-pay | Admitting: Physical Therapy

## 2018-07-02 ENCOUNTER — Ambulatory Visit: Payer: Self-pay | Admitting: Physical Therapy

## 2018-07-06 ENCOUNTER — Encounter: Payer: Self-pay | Admitting: Physical Therapy

## 2018-07-06 ENCOUNTER — Ambulatory Visit: Payer: Medicare Other | Admitting: Physical Therapy

## 2018-07-06 NOTE — Therapy (Signed)
[place] MAIN [facility] SERVICES [address] [place], [place], [address] Phone: [phone]   Fax:  [phone]  Patient Details  Name: Faith Santiago MRN: [id number] Date of Birth: [birth year] Referring Provider:  No ref. provider found  Encounter Date: 07/06/2018 Patient was called about clinic being closed due to pandemic and that we would be in touch with her when there is a new plan to open the clinic.  Arelia Sneddon S,PT DPT 07/06/2018, 3:07 PM  [place] MAIN [facility] SERVICES [address] [place], [place], [address] Phone: [phone]   Fax:  [phone]

## 2018-07-08 ENCOUNTER — Ambulatory Visit: Payer: Medicare Other | Admitting: Physical Therapy

## 2018-07-13 ENCOUNTER — Ambulatory Visit: Payer: Self-pay | Admitting: Physical Therapy

## 2018-07-15 ENCOUNTER — Ambulatory Visit: Payer: Self-pay | Admitting: Physical Therapy

## 2018-07-20 ENCOUNTER — Ambulatory Visit: Payer: Self-pay | Admitting: Physical Therapy

## 2018-07-22 ENCOUNTER — Ambulatory Visit: Payer: Self-pay | Admitting: Physical Therapy

## 2018-07-29 NOTE — Therapy (Signed)
[place] MAIN [facility] SERVICES [address] [place], [place], [address] Phone: [phone]   Fax:  [phone]  Patient Details  Name: Faith Santiago MRN: [id number] Date of Birth: Jan 22, 1941 Referring Provider:  No ref. provider found  Encounter Date: 07/29/2018   The [facility] outpatient clinics are closed at this time due to the COVID-19. The patient was contacted in regards to therapy services. The patient is in agreement that she is safe and consent to being on hold for therapy services until the [facility] outpatient facilities reopen. At which time, the patient will be contacted to schedule an appointment to resume therapy services.       Phillips Grout PT, DPT, GCS  Leonidas Boateng 07/29/2018, 10:55 AM  [place] MAIN [facility] SERVICES [address] [place], [place], [address] Phone: [phone]   Fax:  [phone]

## 2018-08-17 ENCOUNTER — Ambulatory Visit: Payer: Medicare Other | Admitting: Psychiatry

## 2018-08-17 ENCOUNTER — Encounter: Payer: Self-pay | Admitting: Psychiatry

## 2018-08-17 ENCOUNTER — Other Ambulatory Visit: Payer: Self-pay

## 2018-08-17 ENCOUNTER — Ambulatory Visit (INDEPENDENT_AMBULATORY_CARE_PROVIDER_SITE_OTHER): Payer: Medicare Other | Admitting: Psychiatry

## 2018-08-17 DIAGNOSIS — F331 Major depressive disorder, recurrent, moderate: Secondary | ICD-10-CM

## 2018-08-17 MED ORDER — VENLAFAXINE HCL ER 150 MG PO CP24: 150.0000 mg | ORAL_CAPSULE | Freq: Every day | ORAL | 1 refills | Status: DC

## 2018-08-17 MED ORDER — ARIPIPRAZOLE 10 MG PO TABS: 10.0000 mg | ORAL_TABLET | Freq: Every day | ORAL | 3 refills | Status: DC

## 2018-08-17 MED ORDER — ALPRAZOLAM 0.25 MG PO TABS: 0.2500 mg | ORAL_TABLET | Freq: Every evening | ORAL | 2 refills | Status: DC | PRN

## 2018-08-17 NOTE — Progress Notes (Signed)
Patient ID: Faith Santiago, female   DOB: 1941-01-11, 78 y.o.   MRN: [id number]   Patient is a6 year old female with history of depression who was followed up for her medications. She reported that she has been staying at home most of the time due to Covid. She is currently doing well and is is stable on her current medications. She reported that she takes Xanax at bedtime to help her relax and sleep. She does not have any adverse effects related to the medications. She is in contact with her family members and trying to stay safe. She reported that she does not feel depressed hopeless helpless. Her more symptoms are under control. She is compliant with her medications. No new symptoms noted at this time.   Plan: I will refer her Xanax at local pharmacy. Refill other medications for the mail order pharmacy.. Patient will follow up in three months earlier depending on her symptoms.  I discussed the assessment and treatment plan with the patient. The patient was provided an opportunity to ask questions and all were answered. The patient agreed with the plan and demonstrated an understanding of the instructions.   The patient was advised to call back or seek an in-person evaluation if the symptoms worsen or if the condition fails to improve as anticipated.   I provided 15 minutes of non-face-to-face time during this encounter

## 2018-09-01 ENCOUNTER — Other Ambulatory Visit: Payer: Self-pay

## 2018-09-01 ENCOUNTER — Encounter: Payer: Self-pay | Admitting: Physical Therapy

## 2018-09-01 ENCOUNTER — Ambulatory Visit: Payer: Medicare Other | Attending: Internal Medicine | Admitting: Physical Therapy

## 2018-09-01 DIAGNOSIS — M545 Low back pain, unspecified: Secondary | ICD-10-CM

## 2018-09-01 DIAGNOSIS — R2681 Unsteadiness on feet: Secondary | ICD-10-CM | POA: Diagnosis present

## 2018-09-01 DIAGNOSIS — G8929 Other chronic pain: Secondary | ICD-10-CM | POA: Insufficient documentation

## 2018-09-01 DIAGNOSIS — M6281 Muscle weakness (generalized): Secondary | ICD-10-CM | POA: Diagnosis present

## 2018-09-01 DIAGNOSIS — R278 Other lack of coordination: Secondary | ICD-10-CM | POA: Insufficient documentation

## 2018-09-01 DIAGNOSIS — R42 Dizziness and giddiness: Secondary | ICD-10-CM | POA: Diagnosis present

## 2018-09-01 NOTE — Therapy (Signed)
[place] MAIN [facility] SERVICES [address] [place], [place], [address] Phone: [phone]   Fax:  [phone]  Physical Therapy Evaluation  Patient Details  Name: Faith Santiago MRN: [id number] Date of Birth: 1940-08-24 Referring Provider (PT): [name]   Encounter Date: 09/01/2018  PT End of Session - 09/01/18 1426    Visit Number  1    Number of Visits  17    Date for PT Re-Evaluation  10/27/18    PT Start Time  0215    PT Stop Time  0300    PT Time Calculation (min)  45 min    Equipment Utilized During Treatment  Gait belt    Activity Tolerance  Patient tolerated treatment well    Behavior During Therapy  [facility] for tasks assessed/performed       Past Medical History:  Diagnosis Date  . Anemia, unspecified   . Anxiety   . Arthritis    hands R>L, neck and lower back  . Bimalleolar fracture of left ankle 11/19/2013   [place]  . Cancer ([place])   . Cataract   . Depression   . Esophageal reflux   . GERD with stricture 06/03/2007  . Glaucoma   . History of osteopenia    DEXA WNL 2008, 2015  . History of uterine cancer 1999   s/p hysterectomy  . HLD (hyperlipidemia)   . Narcolepsy   . Thyroid disease     Past Surgical History:  Procedure Laterality Date  . bilateral catarct removal  2010  . COLONOSCOPY  05/2003   WNL (Dr Velora Heckler)  . COLONOSCOPY  11/2017   3 small TA, diverticulisis, no f/u needed Carlean Purl)  . DEXA  03/2014   WNL T score +3.9  . ESOPHAGOGASTRODUODENOSCOPY  11/2017   dilated esoph stenosis, 8cm HH ([facility]essner)  . ESOPHAGOGASTRODUODENOSCOPY  12/2017   repeat dilation of esoph stenosis, 5cm HH - rec stay on PPI Carlean Purl)  . ORIF ANKLE FRACTURE Left 11/19/2013   Procedure: LEFT ANKLE FRACTURE OPEN TREATMENT BIMALLEOLAR ANKLE INCLUDES INTERNAL FIXATION ;  Surgeon: Johnny Bridge, MD  . RETINAL DETACHMENT REPAIR W/ SCLERAL BUCKLE LE Right 03/21/2016  . TOTAL ABDOMINAL HYSTERECTOMY  1999   uterine cancer     There were no vitals filed for this visit.   Subjective Assessment - 09/01/18 1419    Subjective  Patient is unsteady when she gets up quickly. She has not had any falls.     Pertinent History  She had an episode where she felt not right and slumped to the floor, it was over by the time she got to the floor and then after a few minutes she was able to stand back up. She is 78 yo Female with history of low back pain, reports impaired balance and increased fear of falling; Reports increased history of severe falls. Her last fall occurred in Sept 2019, when she tripped over a floor transition; at that time she was unable to get back up; She reports suffering a bruise as a result of fall; Patient is currently not using any assistive device; She reports that she doesn't do a lot of walking but recently took a trip where she had to walk for long distances. She reports having to take increased rest breaks due to fatigue and back pain; Denies any LE pain;     Limitations  Standing;Walking    How long can you sit comfortably?  NA    How long can  you stand comfortably?  increased pain with prolonged standing >30 min    How long can you walk comfortably?  about 500 feet with fatigue/back pain;     Diagnostic tests  none recent    Patient Stated Goals  walk farther, reduce fall risk;     Currently in Pain?  No/denies         [facility] PT Assessment - 09/01/18 1422      Assessment   Medical Diagnosis  unsteady    Referring Provider (PT)  [name]    Onset Date/Surgical Date  04/29/18    Hand Dominance  Right    Next MD Visit  Spring 2020    Prior Therapy  had pelvic health PT with good results; denies any PT for imbalance;       Precautions   Precautions  Fall      Restrictions   Weight Bearing Restrictions  No      Balance Screen   Has the patient fallen in the past 6 months  Yes    How many times?  2    Has the patient had a decrease in activity level because of a fear of  falling?   Yes    Is the patient reluctant to leave their home because of a fear of falling?   No      Home Environment   Additional Comments  Lives in 2 story home, does have 5 stairs to enter, negotiates steps one step at a time with rail assist; mod I for self care ADLs; does cooking/cleaning;       Prior Function   Level of Independence  Independent    Vocation  Retired    Leisure  likes to read, some sewing/quilting; does sit most of the day ;      Cognition   Overall Cognitive Status  Within Functional Limits for tasks assessed       PAIN: no pain reported today  POSTURE: flexed trunk in standing   PROM/AROM: WFL  STRENGTH:  Graded on a 0-5 scale Muscle Group Left Right                          Hip Flex 3/5 3/5  Hip Abd 3/5 3/5  Hip Add 2/5 2+/5  Hip Ext 2/5 2/5      Knee Flex 4/5 4/5      Knee Ext 4/5     4/5  Ankle DF 4/5 4/5  Ankle PF 4/5 4/5   SENSATION:WFL  :FUNCTIONAL MOBILITY: Independent with sit to stand and rolling prone to supine   BALANCE: Standing Dynamic Balance  Normal Stand independently unsupported, able to weight shift and cross midline maximally   Good Stand independently unsupported, able to weight shift and cross midline moderately   Good-/Fair+ Stand independently unsupported, able to weight shift across midline minimally x  Fair Stand independently unsupported, weight shift, and reach ipsilaterally, loss of balance when crossing midline   Poor+ Able to stand with Min A and reach ipsilaterally, unable to weight shift   Poor Able to stand with Mod A and minimally reach ipsilaterally, unable to cross midline.    Static Standing Balance  Normal Able to maintain standing balance against maximal resistance   Good Able to maintain standing balance against moderate resistance x  Good-/Fair+ Able to maintain standing balance against minimal resistance   Fair Able to stand unsupported without UE support and without LOB for  1-2 min    Fair- Requires Min A and UE support to maintain standing without loss of balance   Poor+ Requires mod A and UE support to maintain standing without loss of balance   Poor Requires max A and UE support to maintain standing balance without loss      GAIT: Patient ambulates with flexed trunk and decreased step length and RLE crossing over with increased gait speed  OUTCOME MEASURES: TEST Outcome Interpretation  DGI 19/24sec 24/24 is normal  10 meter walk test      1.01           m/s <1.0 m/s indicates increased risk for falls; limited community ambulator  Timed up and Go    14.75             sec <14 sec indicates increased risk for falls  6 minute walk test    730            Feet 1000 feet is community ambulator           Treatment: Standing to tandem stand  X 10 reps x 2 Single leg standing x 10 reps VC for technique           Objective measurements completed on examination: See above findings.              PT Education - 09/01/18 1425    Education Details  plan of care    Person(s) Educated  Patient    Methods  Explanation    Comprehension  Verbalized understanding       PT Short Term Goals - 09/01/18 1428      PT SHORT TERM GOAL #1   Title  Patient will be adherent to HEP at least 3x a week to improve functional strength and balance for better safety at home.    Time  4    Period  Weeks    Status  New    Target Date  09/15/18      PT SHORT TERM GOAL #2   Title  Patient will increase six minute walk test distance to >1300 for progression to closer to age group norms and improve functional gait distance;     Time  4    Period  Weeks    Status  New    Target Date  09/15/18        PT Long Term Goals - 09/01/18 1427      PT LONG TERM GOAL #1   Title  Patient will increase six minute walk test distance to >1000 for progression to community ambulator and improve gait ability    Time  8    Period  Weeks    Status  New    Target Date  10/27/18       PT LONG TERM GOAL #2   Title  Patient will report a worst pain of 3/10 on VAS in  low back    to improve tolerance with ADLs and reduced symptoms with activities.     Time  8    Period  Weeks    Status  New    Target Date  10/27/18      PT LONG TERM GOAL #3   Title  Patient will increase BLE gross strength to 4+/5 as to improve functional strength for independent gait, increased standing tolerance and increased ADL ability.    Time  8    Period  Weeks    Status  New  Target Date  10/27/18      PT LONG TERM GOAL #4   Title  Pt will improve DGI by at least 3 points in order to demonstrate clinically significant improvement in balance and decreased risk for falls     Time  8    Period  Weeks    Status  New    Target Date  10/27/18      PT LONG TERM GOAL #5   Time  8             Plan - 09/01/18 1500    Clinical Impression Statement  78 yo Female reports general decline in balance over last several years. She reports some severe falls resulting in significant injury over last few years which has contributed to her fear of falling. Patient has weakness in BLE in hip and ankle. She ambulates without an assistive device, mod I, but with decreased stride length as a result of decreased hip/knee flexion during swing.and flexed trunk. Patient tested as a  fall risk on TUG test and other outcome measures. In addition she does have low back pain which does seem to be exacerbated with prolonged standing and walking. She would benefit from additional skilled PT Intervention to improve strength, balance and gait safety;     Rehab Potential  Good    Clinical Impairments Affecting Rehab Potential  motivated, good results from previous therapy    PT Frequency  2x / week    PT Duration  4 weeks    PT Treatment/Interventions  Aquatic Therapy;Cryotherapy;Electrical Stimulation;Moist Heat;Gait training;Stair training;Functional mobility training;Therapeutic activities;Therapeutic exercise;Balance  training;Neuromuscular re-education;Patient/family education;Manual techniques;Passive range of motion;Energy conservation;ADLs/Self Care Home Management;Canalith Repostioning;Iontophoresis 4mg /ml Dexamethasone;Traction;Ultrasound;DME Instruction;Vestibular    PT Next Visit Plan  Retest R posterior canal with Dix-Hallpike using IR goggles, update additional outcome measures/goals, initiate HEP, work on balance    PT Home Exercise Plan  see pt instructions, medbridge code: [email or web address]    Consulted and Agree with Plan of Care  Patient       Patient will benefit from skilled therapeutic intervention in order to improve the following deficits and impairments:  Abnormal gait, Decreased endurance, Hypomobility, Cardiopulmonary status limiting activity, Decreased activity tolerance, Decreased strength, Pain, Difficulty walking, Decreased mobility, Decreased balance, Postural dysfunction, Decreased safety awareness  Visit Diagnosis: Unsteadiness on feet  Chronic bilateral low back pain without sciatica  Dizziness and giddiness  Muscle weakness (generalized)  Other lack of coordination     Problem List Patient Active Problem List   Diagnosis Date Noted  . Urinary incontinence 03/15/2018  . Falls 03/15/2018  . Uterine cancer ([place]) 01/22/2018  . Prediabetes 10/14/2017  . Hypothyroidism 04/07/2015  . Vitamin D deficiency 04/02/2015  . Macrocytosis 03/28/2015  . Other fatigue 11/25/2014  . Advanced care planning/counseling discussion 03/15/2014  . Health maintenance examination 03/15/2014  . Postsurgical menopause 08/07/2011  . Medicare annual wellness visit, subsequent 08/06/2011  . Osteoarthritis 06/07/2009  . Narcolepsy without cataplexy 05/31/2008  . HLD (hyperlipidemia) 06/03/2007  . MDD (major depressive disorder), recurrent episode, severe ([place]) 06/03/2007  . GERD with stricture 06/03/2007    Alanson Puls, PT DPT 09/01/2018, 3:17 PM  [place] MAIN [facility] SERVICES [address] [place], [place], [address] Phone: [phone]   Fax:  [phone]  Name: Faith Santiago MRN: [id number] Date of Birth: [birth year]

## 2018-09-03 ENCOUNTER — Ambulatory Visit: Payer: Medicare Other | Admitting: Physical Therapy

## 2018-09-03 ENCOUNTER — Other Ambulatory Visit: Payer: Self-pay

## 2018-09-03 ENCOUNTER — Encounter: Payer: Self-pay | Admitting: Physical Therapy

## 2018-09-03 DIAGNOSIS — R2681 Unsteadiness on feet: Secondary | ICD-10-CM

## 2018-09-03 DIAGNOSIS — G8929 Other chronic pain: Secondary | ICD-10-CM

## 2018-09-03 DIAGNOSIS — M6281 Muscle weakness (generalized): Secondary | ICD-10-CM

## 2018-09-03 DIAGNOSIS — R42 Dizziness and giddiness: Secondary | ICD-10-CM

## 2018-09-03 DIAGNOSIS — R278 Other lack of coordination: Secondary | ICD-10-CM

## 2018-09-03 NOTE — Therapy (Signed)
[place] MAIN [facility] SERVICES [address] [place], [place], [address] Phone: [phone]   Fax:  [phone]  Physical Therapy Treatment  Patient Details  Name: Faith Santiago MRN: [id number] Date of Birth: 07-31-1940 Referring Provider (PT): [name]   Encounter Date: 09/03/2018  PT End of Session - 09/03/18 1415    Visit Number  2    Number of Visits  17    Date for PT Re-Evaluation  10/27/18    PT Start Time  0213    PT Stop Time  0255    PT Time Calculation (min)  42 min    Equipment Utilized During Treatment  Gait belt    Activity Tolerance  Patient tolerated treatment well    Behavior During Therapy  [facility] for tasks assessed/performed       Past Medical History:  Diagnosis Date  . Anemia, unspecified   . Anxiety   . Arthritis    hands R>L, neck and lower back  . Bimalleolar fracture of left ankle 11/19/2013   [place]  . Cancer ([place])   . Cataract   . Depression   . Esophageal reflux   . GERD with stricture 06/03/2007  . Glaucoma   . History of osteopenia    DEXA WNL 2008, 2015  . History of uterine cancer 1999   s/p hysterectomy  . HLD (hyperlipidemia)   . Narcolepsy   . Thyroid disease     Past Surgical History:  Procedure Laterality Date  . bilateral catarct removal  2010  . COLONOSCOPY  05/2003   WNL (Dr Velora Heckler)  . COLONOSCOPY  11/2017   3 small TA, diverticulisis, no f/u needed Carlean Purl)  . DEXA  03/2014   WNL T score +3.9  . ESOPHAGOGASTRODUODENOSCOPY  11/2017   dilated esoph stenosis, 8cm HH (Gessner)  . ESOPHAGOGASTRODUODENOSCOPY  12/2017   repeat dilation of esoph stenosis, 5cm HH - rec stay on PPI Carlean Purl)  . ORIF ANKLE FRACTURE Left 11/19/2013   Procedure: LEFT ANKLE FRACTURE OPEN TREATMENT BIMALLEOLAR ANKLE INCLUDES INTERNAL FIXATION ;  Surgeon: Johnny Bridge, MD  . RETINAL DETACHMENT REPAIR W/ SCLERAL BUCKLE LE Right 03/21/2016  . TOTAL ABDOMINAL HYSTERECTOMY  1999   uterine cancer     There were no vitals filed for this visit.  Subjective Assessment - 09/03/18 1457    Subjective  Patient is unsteady when she gets up quickly. She has not had any falls.     Pertinent History  She had an episode where she felt not right and slumped to the floor, it was over by the time she got to the floor and then after a few minutes she was able to stand back up. She is 78 yo Female with history of low back pain, reports impaired balance and increased fear of falling; Reports increased history of severe falls. Her last fall occurred in Sept 2019, when she tripped over a floor transition; at that time she was unable to get back up; She reports suffering a bruise as a result of fall; Patient is currently not using any assistive device; She reports that she doesn't do a lot of walking but recently took a trip where she had to walk for long distances. She reports having to take increased rest breaks due to fatigue and back pain; Denies any LE pain;     Limitations  Standing;Walking    How long can you sit comfortably?  NA    How long can you  stand comfortably?  increased pain with prolonged standing >30 min    How long can you walk comfortably?  about 500 feet with fatigue/back pain;     Diagnostic tests  none recent    Patient Stated Goals  walk farther, reduce fall risk;     Currently in Pain?  No/denies       TherapeuticExercises: Nustep level 3 x 5 mins with PT monitoring throughout, intervals for cardiovascular endurance, strengthening Standing  Heel Toe Raises - 10 reps - 2 setswith demo and verbal cues for exercise technique Seated Long Arc Quad - 10 reps - 2 setswith demo and verbal cues for exercise technique   Neuromuscular re-edu: Matrix with 22. 5 lbs x 2 reps fwd/bwd/side stepping left and right  Toe taps to 4" step in // bars, CGA, able to progress for single UE to no UE support, verbal cues for knee flexion, upright posture Tandem standing on foam with trunk  rotation left and right x 20 Sidestepping on blue balance foam x 10 feet x 5 repetitions  Step ups from 6 inch stool x 20 , CGA Step downs from 6 inch stool to foam x 20 with CGA       Step over hurdles fwd/backward/side to side x10 ea way bilaterally, CGA throughout, progressed for bilateral UE to no UE support, cues for upright posture, foot clearance, step length bilaterally                          PT Education - 09/03/18 1414    Education Details  HEP    Person(s) Educated  Patient    Methods  Explanation    Comprehension  Verbalized understanding       PT Short Term Goals - 09/01/18 1428      PT SHORT TERM GOAL #1   Title  Patient will be adherent to HEP at least 3x a week to improve functional strength and balance for better safety at home.    Time  4    Period  Weeks    Status  New    Target Date  09/15/18      PT SHORT TERM GOAL #2   Title  Patient will increase six minute walk test distance to >1300 for progression to closer to age group norms and improve functional gait distance;     Time  4    Period  Weeks    Status  New    Target Date  09/15/18        PT Long Term Goals - 09/01/18 1427      PT LONG TERM GOAL #1   Title  Patient will increase six minute walk test distance to >1000 for progression to community ambulator and improve gait ability    Time  8    Period  Weeks    Status  New    Target Date  10/27/18      PT LONG TERM GOAL #2   Title  Patient will report a worst pain of 3/10 on VAS in  low back    to improve tolerance with ADLs and reduced symptoms with activities.     Time  8    Period  Weeks    Status  New    Target Date  10/27/18      PT LONG TERM GOAL #3   Title  Patient will increase BLE gross strength to 4+/5 as to improve functional strength for  independent gait, increased standing tolerance and increased ADL ability.    Time  8    Period  Weeks    Status  New    Target Date  10/27/18      PT LONG TERM GOAL  #4   Title  Pt will improve DGI by at least 3 points in order to demonstrate clinically significant improvement in balance and decreased risk for falls     Time  8    Period  Weeks    Status  New    Target Date  10/27/18      PT LONG TERM GOAL #5   Time  8            Plan - 09/03/18 1440    Clinical Impression Statement  Patient with good tolerance to activities today. Patient needed CGA for intermediate and higher level balance activities to ensure safety, especially with uneven surfaces. Patient most challenged by narrow base of support activities, cues for balance strategies throughout, and mod verbal cues to maintain proper exercise form/technique. The patient would benefit from further skilled PT to continue to progress towards goals, decrease risk of falls, and improve functional abilities    Rehab Potential  Good    Clinical Impairments Affecting Rehab Potential  motivated, good results from previous therapy    PT Frequency  2x / week    PT Duration  8 weeks    PT Treatment/Interventions  Aquatic Therapy;Cryotherapy;Electrical Stimulation;Moist Heat;Gait training;Stair training;Functional mobility training;Therapeutic activities;Therapeutic exercise;Balance training;Neuromuscular re-education;Patient/family education;Manual techniques;Passive range of motion;Energy conservation;ADLs/Self Care Home Management;Canalith Repostioning;Iontophoresis 4mg /ml Dexamethasone;Traction;Ultrasound;DME Instruction;Vestibular    PT Next Visit Plan  Retest R posterior canal with Dix-Hallpike using IR goggles, update additional outcome measures/goals, initiate HEP, work on balance    PT Home Exercise Plan  see pt instructions, medbridge code: [email or web address]    Consulted and Agree with Plan of Care  Patient       Patient will benefit from skilled therapeutic intervention in order to improve the following deficits and impairments:  Abnormal gait, Decreased endurance, Hypomobility, Cardiopulmonary status  limiting activity, Decreased activity tolerance, Decreased strength, Pain, Difficulty walking, Decreased mobility, Decreased balance, Postural dysfunction, Decreased safety awareness  Visit Diagnosis: Unsteadiness on feet  Chronic bilateral low back pain without sciatica  Dizziness and giddiness  Muscle weakness (generalized)  Other lack of coordination     Problem List Patient Active Problem List   Diagnosis Date Noted  . Urinary incontinence 03/15/2018  . Falls 03/15/2018  . Uterine cancer ([place]) 01/22/2018  . Prediabetes 10/14/2017  . Hypothyroidism 04/07/2015  . Vitamin D deficiency 04/02/2015  . Macrocytosis 03/28/2015  . Other fatigue 11/25/2014  . Advanced care planning/counseling discussion 03/15/2014  . Health maintenance examination 03/15/2014  . Postsurgical menopause 08/07/2011  . Medicare annual wellness visit, subsequent 08/06/2011  . Osteoarthritis 06/07/2009  . Narcolepsy without cataplexy 05/31/2008  . HLD (hyperlipidemia) 06/03/2007  . MDD (major depressive disorder), recurrent episode, severe ([place]) 06/03/2007  . GERD with stricture 06/03/2007    Alanson Puls, PT DPT 09/03/2018, 2:58 PM  [place] MAIN [facility] SERVICES [address] [place], [place], [address] Phone: [phone]   Fax:  [phone]  Name: Faith Santiago MRN: [id number] Date of Birth: 03-02-1941

## 2018-09-09 ENCOUNTER — Other Ambulatory Visit: Payer: Self-pay

## 2018-09-09 ENCOUNTER — Ambulatory Visit: Payer: Medicare Other | Admitting: Physical Therapy

## 2018-09-09 DIAGNOSIS — R2681 Unsteadiness on feet: Secondary | ICD-10-CM | POA: Diagnosis not present

## 2018-09-09 DIAGNOSIS — R42 Dizziness and giddiness: Secondary | ICD-10-CM

## 2018-09-09 DIAGNOSIS — G8929 Other chronic pain: Secondary | ICD-10-CM

## 2018-09-09 DIAGNOSIS — M6281 Muscle weakness (generalized): Secondary | ICD-10-CM

## 2018-09-09 NOTE — Therapy (Signed)
[place] MAIN [facility] SERVICES [address] [place], [place], [address] Phone: [phone]   Fax:  [phone]  Physical Therapy Treatment  Patient Details  Name: Faith Santiago MRN: [id number] Date of Birth: Jul 24, 1940 Referring Provider (PT): [name]   Encounter Date: 09/09/2018  PT End of Session - 09/09/18 1158    Visit Number  3    Number of Visits  17    Date for PT Re-Evaluation  10/27/18    PT Start Time  1150    PT Stop Time  1228    PT Time Calculation (min)  38 min    Equipment Utilized During Treatment  Gait belt    Activity Tolerance  Patient tolerated treatment well    Behavior During Therapy  [facility] for tasks assessed/performed       Past Medical History:  Diagnosis Date  . Anemia, unspecified   . Anxiety   . Arthritis    hands R>L, neck and lower back  . Bimalleolar fracture of left ankle 11/19/2013   [place]  . Cancer ([place])   . Cataract   . Depression   . Esophageal reflux   . GERD with stricture 06/03/2007  . Glaucoma   . History of osteopenia    DEXA WNL 2008, 2015  . History of uterine cancer 1999   s/p hysterectomy  . HLD (hyperlipidemia)   . Narcolepsy   . Thyroid disease     Past Surgical History:  Procedure Laterality Date  . bilateral catarct removal  2010  . COLONOSCOPY  05/2003   WNL (Dr Velora Heckler)  . COLONOSCOPY  11/2017   3 small TA, diverticulisis, no f/u needed Carlean Purl)  . DEXA  03/2014   WNL T score +3.9  . ESOPHAGOGASTRODUODENOSCOPY  11/2017   dilated esoph stenosis, 8cm HH ([facility])  . ESOPHAGOGASTRODUODENOSCOPY  12/2017   repeat dilation of esoph stenosis, 5cm HH - rec stay on PPI Carlean Purl)  . ORIF ANKLE FRACTURE Left 11/19/2013   Procedure: LEFT ANKLE FRACTURE OPEN TREATMENT BIMALLEOLAR ANKLE INCLUDES INTERNAL FIXATION ;  Surgeon: Johnny Bridge, MD  . RETINAL DETACHMENT REPAIR W/ SCLERAL BUCKLE LE Right 03/21/2016  . TOTAL ABDOMINAL HYSTERECTOMY  1999   uterine cancer     There were no vitals filed for this visit.  Subjective Assessment - 09/09/18 1154    Subjective  Patient is unsteady when she gets up quickly. She has not had any falls. No new problems.    Pertinent History  She had an episode where she felt not right and slumped to the floor, it was over by the time she got to the floor and then after a few minutes she was able to stand back up. She is 78 yo Female with history of low back pain, reports impaired balance and increased fear of falling; Reports increased history of severe falls. Her last fall occurred in Sept 2019, when she tripped over a floor transition; at that time she was unable to get back up; She reports suffering a bruise as a result of fall; Patient is currently not using any assistive device; She reports that she doesn't do a lot of walking but recently took a trip where she had to walk for long distances. She reports having to take increased rest breaks due to fatigue and back pain; Denies any LE pain;     Limitations  Standing;Walking    How long can you sit comfortably?  NA    How long  can you stand comfortably?  increased pain with prolonged standing >30 min    How long can you walk comfortably?  about 500 feet with fatigue/back pain;     Diagnostic tests  none recent    Patient Stated Goals  walk farther, reduce fall risk;     Currently in Pain?  No/denies    Pain Score  0-No pain        Neuromuscular re-edu   Standing on airex: Trunk rotation side to side with ball BUE hold and head turning with trunk x 10  Alternate toe taps on 4 inch step without rail assist x15 with cues to step backwards for better foot placement and avoid toe catching; Modified tandem stance with BUE ball up/down x10 each foot in front with CGA for safety;  Standing on airex beam:  ; Side stepping down airex beam without rail assist x3 laps each direction with cues to keep feet on beam and avoid stepping off; Standing with feet apart, BUE ball  toss x10 unsupportedwith close supervision; Patient exhibits posterior loss of balance requiring cues for forward weight shift; stepping over hurdle x 10 fwd / bwd, 10 side to side with UE support LateralToe taps to 6" step without UE support alternating LE x15 each: Toe taps to 4" step in // bars, CGA, able to progress for single UE to no UE support, verbal cues for knee flexion, upright posture Matrix fwd/bwd x 5 22. 5 lbs, side stepping 7. 5 lbs x 3 left and right with min assist                       PT Education - 09/09/18 1155    Education Details  HEP    Person(s) Educated  Patient    Methods  Explanation    Comprehension  Verbalized understanding       PT Short Term Goals - 09/01/18 1428      PT SHORT TERM GOAL #1   Title  Patient will be adherent to HEP at least 3x a week to improve functional strength and balance for better safety at home.    Time  4    Period  Weeks    Status  New    Target Date  09/15/18      PT SHORT TERM GOAL #2   Title  Patient will increase six minute walk test distance to >1300 for progression to closer to age group norms and improve functional gait distance;     Time  4    Period  Weeks    Status  New    Target Date  09/15/18        PT Long Term Goals - 09/01/18 1427      PT LONG TERM GOAL #1   Title  Patient will increase six minute walk test distance to >1000 for progression to community ambulator and improve gait ability    Time  8    Period  Weeks    Status  New    Target Date  10/27/18      PT LONG TERM GOAL #2   Title  Patient will report a worst pain of 3/10 on VAS in  low back    to improve tolerance with ADLs and reduced symptoms with activities.     Time  8    Period  Weeks    Status  New    Target Date  10/27/18      PT  LONG TERM GOAL #3   Title  Patient will increase BLE gross strength to 4+/5 as to improve functional strength for independent gait, increased standing tolerance and increased ADL  ability.    Time  8    Period  Weeks    Status  New    Target Date  10/27/18      PT LONG TERM GOAL #4   Title  Pt will improve DGI by at least 3 points in order to demonstrate clinically significant improvement in balance and decreased risk for falls     Time  8    Period  Weeks    Status  New    Target Date  10/27/18      PT LONG TERM GOAL #5   Time  8            Plan - 09/09/18 1159    Clinical Impression Statement  Patient demonstrates improved stability and strength allowing patient to perform short duration standing interventions with rest periods.  Patient performs beginning standing dynamic standing balance exercises to shift weight and perform single leg standing activities with mod assist. Patient fatigues quickly with exercises requiring rest breaks at this time. Patient will continue to benefit from skilled physical therapy to improve pain and mobility.    Rehab Potential  Good    Clinical Impairments Affecting Rehab Potential  motivated, good results from previous therapy    PT Frequency  2x / week    PT Duration  8 weeks    PT Treatment/Interventions  Aquatic Therapy;Cryotherapy;Electrical Stimulation;Moist Heat;Gait training;Stair training;Functional mobility training;Therapeutic activities;Therapeutic exercise;Balance training;Neuromuscular re-education;Patient/family education;Manual techniques;Passive range of motion;Energy conservation;ADLs/Self Care Home Management;Canalith Repostioning;Iontophoresis 4mg /ml Dexamethasone;Traction;Ultrasound;DME Instruction;Vestibular    PT Next Visit Plan  Retest R posterior canal with Dix-Hallpike using IR goggles, update additional outcome measures/goals, initiate HEP, work on balance    PT Home Exercise Plan  see pt instructions, medbridge code: [email or web address]    Consulted and Agree with Plan of Care  Patient       Patient will benefit from skilled therapeutic intervention in order to improve the following deficits and  impairments:  Abnormal gait, Decreased endurance, Hypomobility, Cardiopulmonary status limiting activity, Decreased activity tolerance, Decreased strength, Pain, Difficulty walking, Decreased mobility, Decreased balance, Postural dysfunction, Decreased safety awareness  Visit Diagnosis: Unsteadiness on feet  Chronic bilateral low back pain without sciatica  Dizziness and giddiness  Muscle weakness (generalized)     Problem List Patient Active Problem List   Diagnosis Date Noted  . Urinary incontinence 03/15/2018  . Falls 03/15/2018  . Uterine cancer ([place]) 01/22/2018  . Prediabetes 10/14/2017  . Hypothyroidism 04/07/2015  . Vitamin D deficiency 04/02/2015  . Macrocytosis 03/28/2015  . Other fatigue 11/25/2014  . Advanced care planning/counseling discussion 03/15/2014  . Health maintenance examination 03/15/2014  . Postsurgical menopause 08/07/2011  . Medicare annual wellness visit, subsequent 08/06/2011  . Osteoarthritis 06/07/2009  . Narcolepsy without cataplexy 05/31/2008  . HLD (hyperlipidemia) 06/03/2007  . MDD (major depressive disorder), recurrent episode, severe ([place]) 06/03/2007  . GERD with stricture 06/03/2007    Alanson Puls, PT DPT 09/09/2018, 1:09 PM  [place] MAIN [facility] SERVICES [address] [place], [place], [address] Phone: [phone]   Fax:  [phone]  Name: Faith Santiago MRN: [id number] Date of Birth: April 19, 1940

## 2018-09-14 ENCOUNTER — Other Ambulatory Visit: Payer: Self-pay

## 2018-09-14 ENCOUNTER — Encounter: Payer: Self-pay | Admitting: Physical Therapy

## 2018-09-14 ENCOUNTER — Ambulatory Visit: Payer: Medicare Other | Attending: Internal Medicine | Admitting: Physical Therapy

## 2018-09-14 DIAGNOSIS — M6281 Muscle weakness (generalized): Secondary | ICD-10-CM | POA: Insufficient documentation

## 2018-09-14 DIAGNOSIS — R2681 Unsteadiness on feet: Secondary | ICD-10-CM | POA: Insufficient documentation

## 2018-09-14 DIAGNOSIS — R278 Other lack of coordination: Secondary | ICD-10-CM | POA: Insufficient documentation

## 2018-09-14 DIAGNOSIS — G8929 Other chronic pain: Secondary | ICD-10-CM | POA: Insufficient documentation

## 2018-09-14 DIAGNOSIS — Z9181 History of falling: Secondary | ICD-10-CM

## 2018-09-14 DIAGNOSIS — M545 Low back pain: Secondary | ICD-10-CM | POA: Insufficient documentation

## 2018-09-14 NOTE — Therapy (Signed)
[place] MAIN [facility] SERVICES [address] [place], [place], [address] Phone: [phone]   Fax:  [phone]  Physical Therapy Treatment  Patient Details  Name: Faith Santiago MRN: [id number] Date of Birth: Aug 15, 1940 Referring Provider (PT): [name]   Encounter Date: 09/14/2018  PT End of Session - 09/14/18 1533    Visit Number  4    Number of Visits  17    Date for PT Re-Evaluation  10/27/18    PT Start Time  0312    PT Stop Time  0350    PT Time Calculation (min)  38 min    Equipment Utilized During Treatment  Gait belt    Activity Tolerance  Patient tolerated treatment well    Behavior During Therapy  [facility] for tasks assessed/performed       Past Medical History:  Diagnosis Date  . Anemia, unspecified   . Anxiety   . Arthritis    hands R>L, neck and lower back  . Bimalleolar fracture of left ankle 11/19/2013   [place]  . Cancer ([place])   . Cataract   . Depression   . Esophageal reflux   . GERD with stricture 06/03/2007  . Glaucoma   . History of osteopenia    DEXA WNL 2008, 2015  . History of uterine cancer 1999   s/p hysterectomy  . HLD (hyperlipidemia)   . Narcolepsy   . Thyroid disease     Past Surgical History:  Procedure Laterality Date  . bilateral catarct removal  2010  . COLONOSCOPY  05/2003   WNL (Dr Velora Heckler)  . COLONOSCOPY  11/2017   3 small TA, diverticulisis, no f/u needed Carlean Purl)  . DEXA  03/2014   WNL T score +3.9  . ESOPHAGOGASTRODUODENOSCOPY  11/2017   dilated esoph stenosis, 8cm HH ([facility]ner)  . ESOPHAGOGASTRODUODENOSCOPY  12/2017   repeat dilation of esoph stenosis, 5cm HH - rec stay on PPI Carlean Purl)  . ORIF ANKLE FRACTURE Left 11/19/2013   Procedure: LEFT ANKLE FRACTURE OPEN TREATMENT BIMALLEOLAR ANKLE INCLUDES INTERNAL FIXATION ;  Surgeon: Johnny Bridge, MD  . RETINAL DETACHMENT REPAIR W/ SCLERAL BUCKLE LE Right 03/21/2016  . TOTAL ABDOMINAL HYSTERECTOMY  1999   uterine cancer     There were no vitals filed for this visit.  Subjective Assessment - 09/14/18 1517    Subjective  Patient is doing well and nothing is hurting.     Currently in Pain?  No/denies    Pain Score  0-No pain        Therapeutic exercise:   nustep level 3 for warm up x20mins   Standing on 1/2 bolster (Flat side up): standing normal stance 2x10 with finger tip hold for balance with cues to keep knee straight for better ankle strategies;   Stepping over 1 hurdle bwd x10 cues for posture, increased hip flexion   Standing hip abd with yTB  2x15 bilaterally   Standing hip extension with yTB due to increased fatigue today 2x15 BLE  Leg press 45 lbs heel raises x 20 x 2  Leg press 90 lbs x 20 x 2  TM walking side stepping at . 2 miles / hour x 2 mins left and 2 mins right side stepping  Matrix 22. 5 lbs x 3 reps fwd/bwd/ side stepping left and right     Poor standing posture and need for cues to stay closer to bars for safety.  Cues for core activation of TA in standing  to reduce back pain and for improved posture. Patient needed several sitting rest breaks during session.                        PT Education - 09/14/18 1533    Education Details  HEP    Person(s) Educated  Patient    Methods  Explanation    Comprehension  Verbalized understanding;Returned demonstration;Verbal cues required       PT Short Term Goals - 09/01/18 1428      PT SHORT TERM GOAL #1   Title  Patient will be adherent to HEP at least 3x a week to improve functional strength and balance for better safety at home.    Time  4    Period  Weeks    Status  New    Target Date  09/15/18      PT SHORT TERM GOAL #2   Title  Patient will increase six minute walk test distance to >1300 for progression to closer to age group norms and improve functional gait distance;     Time  4    Period  Weeks    Status  New    Target Date  09/15/18        PT Long Term Goals - 09/01/18 1427      PT  LONG TERM GOAL #1   Title  Patient will increase six minute walk test distance to >1000 for progression to community ambulator and improve gait ability    Time  8    Period  Weeks    Status  New    Target Date  10/27/18      PT LONG TERM GOAL #2   Title  Patient will report a worst pain of 3/10 on VAS in  low back    to improve tolerance with ADLs and reduced symptoms with activities.     Time  8    Period  Weeks    Status  New    Target Date  10/27/18      PT LONG TERM GOAL #3   Title  Patient will increase BLE gross strength to 4+/5 as to improve functional strength for independent gait, increased standing tolerance and increased ADL ability.    Time  8    Period  Weeks    Status  New    Target Date  10/27/18      PT LONG TERM GOAL #4   Title  Pt will improve DGI by at least 3 points in order to demonstrate clinically significant improvement in balance and decreased risk for falls     Time  8    Period  Weeks    Status  New    Target Date  10/27/18      PT LONG TERM GOAL #5   Time  8            Plan - 09/14/18 1534    Clinical Impression Statement  Pt presents with semi  flexed posture and decreased standing tolerance and LE weakness and requires frequent verbal cues for upright posture and education provided on relationship between posture and trunk strength. Patient demonstrates instability with therapeutic exercises and balance exercises but tolerates all interventions well. Patient will benefit from continued skilled PT interventions for improved balance, posture, strength, and QOL.    Rehab Potential  Good    Clinical Impairments Affecting Rehab Potential  motivated, good results from previous therapy    PT  Frequency  2x / week    PT Duration  8 weeks    PT Treatment/Interventions  Aquatic Therapy;Cryotherapy;Electrical Stimulation;Moist Heat;Gait training;Stair training;Functional mobility training;Therapeutic activities;Therapeutic exercise;Balance  training;Neuromuscular re-education;Patient/family education;Manual techniques;Passive range of motion;Energy conservation;ADLs/Self Care Home Management;Canalith Repostioning;Iontophoresis 4mg /ml Dexamethasone;Traction;Ultrasound;DME Instruction;Vestibular    PT Next Visit Plan  Retest R posterior canal with Dix-Hallpike using IR goggles, update additional outcome measures/goals, initiate HEP, work on balance    PT Home Exercise Plan  see pt instructions, medbridge code: [email or web address]    Consulted and Agree with Plan of Care  Patient       Patient will benefit from skilled therapeutic intervention in order to improve the following deficits and impairments:  Abnormal gait, Decreased endurance, Hypomobility, Cardiopulmonary status limiting activity, Decreased activity tolerance, Decreased strength, Pain, Difficulty walking, Decreased mobility, Decreased balance, Postural dysfunction, Decreased safety awareness  Visit Diagnosis: Unsteadiness on feet  Muscle weakness (generalized)  Other lack of coordination  At high risk for falls     Problem List Patient Active Problem List   Diagnosis Date Noted  . Urinary incontinence 03/15/2018  . Falls 03/15/2018  . Uterine cancer ([place]) 01/22/2018  . Prediabetes 10/14/2017  . Hypothyroidism 04/07/2015  . Vitamin D deficiency 04/02/2015  . Macrocytosis 03/28/2015  . Other fatigue 11/25/2014  . Advanced care planning/counseling discussion 03/15/2014  . Health maintenance examination 03/15/2014  . Postsurgical menopause 08/07/2011  . Medicare annual wellness visit, subsequent 08/06/2011  . Osteoarthritis 06/07/2009  . Narcolepsy without cataplexy 05/31/2008  . HLD (hyperlipidemia) 06/03/2007  . MDD (major depressive disorder), recurrent episode, severe ([place]) 06/03/2007  . GERD with stricture 06/03/2007    Alanson Puls, PT DPT 09/14/2018, 3:36 PM  [place] MAIN [facility] SERVICES [address]  [place], [place], [address] Phone: [phone]   Fax:  [phone]  Name: Faith Santiago MRN: [id number] Date of Birth: 05/26/1940

## 2018-09-16 ENCOUNTER — Encounter: Payer: Self-pay | Admitting: Physical Therapy

## 2018-09-16 ENCOUNTER — Other Ambulatory Visit: Payer: Self-pay

## 2018-09-16 ENCOUNTER — Ambulatory Visit: Payer: Medicare Other | Admitting: Physical Therapy

## 2018-09-16 DIAGNOSIS — R2681 Unsteadiness on feet: Secondary | ICD-10-CM

## 2018-09-16 DIAGNOSIS — M6281 Muscle weakness (generalized): Secondary | ICD-10-CM

## 2018-09-16 DIAGNOSIS — R278 Other lack of coordination: Secondary | ICD-10-CM

## 2018-09-16 DIAGNOSIS — Z9181 History of falling: Secondary | ICD-10-CM

## 2018-09-16 NOTE — Therapy (Signed)
[place] MAIN [facility] SERVICES [address] [place], [place], [address] Phone: [phone]   Fax:  [phone]  Physical Therapy Treatment  Patient Details  Name: Faith Santiago MRN: [id number] Date of Birth: 1941/02/25 Referring Provider (PT): [name]   Encounter Date: 09/16/2018  PT End of Session - 09/16/18 1512    Visit Number  5    Number of Visits  17    Date for PT Re-Evaluation  10/27/18    PT Start Time  0303    PT Stop Time  0345    PT Time Calculation (min)  42 min    Equipment Utilized During Treatment  Gait belt    Activity Tolerance  Patient tolerated treatment well    Behavior During Therapy  [facility] for tasks assessed/performed       Past Medical History:  Diagnosis Date  . Anemia, unspecified   . Anxiety   . Arthritis    hands R>L, neck and lower back  . Bimalleolar fracture of left ankle 11/19/2013   [place]  . Cancer ([place])   . Cataract   . Depression   . Esophageal reflux   . GERD with stricture 06/03/2007  . Glaucoma   . History of osteopenia    DEXA WNL 2008, 2015  . History of uterine cancer 1999   s/p hysterectomy  . HLD (hyperlipidemia)   . Narcolepsy   . Thyroid disease     Past Surgical History:  Procedure Laterality Date  . bilateral catarct removal  2010  . COLONOSCOPY  05/2003   WNL (Dr Velora Heckler)  . COLONOSCOPY  11/2017   3 small TA, diverticulisis, no f/u needed Carlean Purl)  . DEXA  03/2014   WNL T score +3.9  . ESOPHAGOGASTRODUODENOSCOPY  11/2017   dilated esoph stenosis, 8cm HH ([facility])  . ESOPHAGOGASTRODUODENOSCOPY  12/2017   repeat dilation of esoph stenosis, 5cm HH - rec stay on PPI Carlean Purl)  . ORIF ANKLE FRACTURE Left 11/19/2013   Procedure: LEFT ANKLE FRACTURE OPEN TREATMENT BIMALLEOLAR ANKLE INCLUDES INTERNAL FIXATION ;  Surgeon: Johnny Bridge, MD  . RETINAL DETACHMENT REPAIR W/ SCLERAL BUCKLE LE Right 03/21/2016  . TOTAL ABDOMINAL HYSTERECTOMY  1999   uterine cancer     There were no vitals filed for this visit.  Subjective Assessment - 09/16/18 1512    Subjective  Patient is doing well and nothing is hurting.     Pertinent History  She had an episode where she felt not right and slumped to the floor, it was over by the time she got to the floor and then after a few minutes she was able to stand back up. She is 78 yo Female with history of low back pain, reports impaired balance and increased fear of falling; Reports increased history of severe falls. Her last fall occurred in Sept 2019, when she tripped over a floor transition; at that time she was unable to get back up; She reports suffering a bruise as a result of fall; Patient is currently not using any assistive device; She reports that she doesn't do a lot of walking but recently took a trip where she had to walk for long distances. She reports having to take increased rest breaks due to fatigue and back pain; Denies any LE pain;     Limitations  Standing;Walking    How long can you sit comfortably?  NA    How long can you stand comfortably?  increased pain with  prolonged standing >30 min    How long can you walk comfortably?  about 500 feet with fatigue/back pain;     Diagnostic tests  none recent    Patient Stated Goals  walk farther, reduce fall risk;     Currently in Pain?  No/denies    Pain Score  0-No pain       Neuromuscular Re-education   Toe taps to 6" step without UE support alternating LE x15 each   Normal stance on Airex pad 2x60sec (requires minA for falls recovery)  Forward and backward stepping over hurdle x15 each direction   SIde stepping over hurdle 15x each direction (requires minA for falls)   Therapeutic exercise:   nustep level 3 for warm up (unbilled) x39mins   Standing on 1/2 bolster (Flat side up): Heel/toe rock 2x10 with finger tip hold for balance with cues to keep knee straight for better ankle strategies;   Stepping over 2 hurdle fwd side to side x 10    Stepping over 1 hurdle bwd x10 cues for posture, increased hip flexion   Standing hip abd without resistance due to weakness 2x5 bilaterally   Standing hip extension without GTB due to increased fatigue today 2x5 BLE   Bosu ball lunge x 10 x 2   Rocker board fwd / bwd, side to side x 10 x 2 with UE support x 2 mins        Cues for core activation of TA in standing to reduce back pain and for improved posture. Patient needed several sitting rest breaks during session.                        PT Education - 09/16/18 1512    Education Details  HEP    Person(s) Educated  Patient    Methods  Explanation    Comprehension  Verbalized understanding;Returned demonstration       PT Short Term Goals - 09/01/18 1428      PT SHORT TERM GOAL #1   Title  Patient will be adherent to HEP at least 3x a week to improve functional strength and balance for better safety at home.    Time  4    Period  Weeks    Status  New    Target Date  09/15/18      PT SHORT TERM GOAL #2   Title  Patient will increase six minute walk test distance to >1300 for progression to closer to age group norms and improve functional gait distance;     Time  4    Period  Weeks    Status  New    Target Date  09/15/18        PT Long Term Goals - 09/01/18 1427      PT LONG TERM GOAL #1   Title  Patient will increase six minute walk test distance to >1000 for progression to community ambulator and improve gait ability    Time  8    Period  Weeks    Status  New    Target Date  10/27/18      PT LONG TERM GOAL #2   Title  Patient will report a worst pain of 3/10 on VAS in  low back    to improve tolerance with ADLs and reduced symptoms with activities.     Time  8    Period  Weeks    Status  New    Target  Date  10/27/18      PT LONG TERM GOAL #3   Title  Patient will increase BLE gross strength to 4+/5 as to improve functional strength for independent gait, increased standing tolerance  and increased ADL ability.    Time  8    Period  Weeks    Status  New    Target Date  10/27/18      PT LONG TERM GOAL #4   Title  Pt will improve DGI by at least 3 points in order to demonstrate clinically significant improvement in balance and decreased risk for falls     Time  8    Period  Weeks    Status  New    Target Date  10/27/18      PT LONG TERM GOAL #5   Time  8            Plan - 09/16/18 1513    Clinical Impression Statement  Pt responded well to all interventions today.  Focus on core and hip strengthening in order to improve balance and ambulation.  Pt attempted hip ABD with weight and single leg bridges against gravity, but was unable to perform against resistance of gravity secondary to weakness.  Pt was able to complete core exercises, requiring cueing for proper form and hip alignment during seated and supine core activities. Pt would continue to benefit from skilled therapy services in order to improve LE and core strength, gait and functional mobility in order to decrease risk of falls and improve independence with mobility.    Rehab Potential  Good    Clinical Impairments Affecting Rehab Potential  motivated, good results from previous therapy    PT Frequency  2x / week    PT Duration  8 weeks    PT Treatment/Interventions  Aquatic Therapy;Cryotherapy;Electrical Stimulation;Moist Heat;Gait training;Stair training;Functional mobility training;Therapeutic activities;Therapeutic exercise;Balance training;Neuromuscular re-education;Patient/family education;Manual techniques;Passive range of motion;Energy conservation;ADLs/Self Care Home Management;Canalith Repostioning;Iontophoresis 4mg /ml Dexamethasone;Traction;Ultrasound;DME Instruction;Vestibular    PT Next Visit Plan  Retest R posterior canal with Dix-Hallpike using IR goggles, update additional outcome measures/goals, initiate HEP, work on balance    PT Home Exercise Plan  see pt instructions, medbridge code:  [email or web address]    Consulted and Agree with Plan of Care  Patient       Patient will benefit from skilled therapeutic intervention in order to improve the following deficits and impairments:  Abnormal gait, Decreased endurance, Hypomobility, Cardiopulmonary status limiting activity, Decreased activity tolerance, Decreased strength, Pain, Difficulty walking, Decreased mobility, Decreased balance, Postural dysfunction, Decreased safety awareness  Visit Diagnosis: Unsteadiness on feet  Muscle weakness (generalized)  Other lack of coordination  At high risk for falls     Problem List Patient Active Problem List   Diagnosis Date Noted  . Urinary incontinence 03/15/2018  . Falls 03/15/2018  . Uterine cancer ([place]) 01/22/2018  . Prediabetes 10/14/2017  . Hypothyroidism 04/07/2015  . Vitamin D deficiency 04/02/2015  . Macrocytosis 03/28/2015  . Other fatigue 11/25/2014  . Advanced care planning/counseling discussion 03/15/2014  . Health maintenance examination 03/15/2014  . Postsurgical menopause 08/07/2011  . Medicare annual wellness visit, subsequent 08/06/2011  . Osteoarthritis 06/07/2009  . Narcolepsy without cataplexy 05/31/2008  . HLD (hyperlipidemia) 06/03/2007  . MDD (major depressive disorder), recurrent episode, severe ([place]) 06/03/2007  . GERD with stricture 06/03/2007    Alanson Puls, PT DPT 09/16/2018, 3:15 PM  [place] MAIN [facility] SERVICES [address] [place], [place], [address] Phone:  [phone]   Fax:  [phone]  Name: DANYELLA MCGINTY MRN: [id number] Date of Birth: October 17, 1940

## 2018-09-21 ENCOUNTER — Other Ambulatory Visit: Payer: Self-pay

## 2018-09-21 ENCOUNTER — Ambulatory Visit: Payer: Medicare Other | Admitting: Physical Therapy

## 2018-09-21 ENCOUNTER — Encounter: Payer: Self-pay | Admitting: Physical Therapy

## 2018-09-21 DIAGNOSIS — R2681 Unsteadiness on feet: Secondary | ICD-10-CM

## 2018-09-21 DIAGNOSIS — M6281 Muscle weakness (generalized): Secondary | ICD-10-CM

## 2018-09-21 NOTE — Therapy (Signed)
[place] MAIN [facility] SERVICES [address] [place], [place], [address] Phone: [phone]   Fax:  [phone]  Physical Therapy Treatment  Patient Details  Name: Faith Santiago MRN: [id number] Date of Birth: 1940/04/22 Referring Provider (PT): [name]   Encounter Date: 09/21/2018  PT End of Session - 09/21/18 1530    Visit Number  6    Number of Visits  17    Date for PT Re-Evaluation  10/27/18    PT Start Time  0305    PT Stop Time  0345    PT Time Calculation (min)  40 min    Equipment Utilized During Treatment  Gait belt    Activity Tolerance  Patient tolerated treatment well    Behavior During Therapy  [facility] for tasks assessed/performed       Past Medical History:  Diagnosis Date  . Anemia, unspecified   . Anxiety   . Arthritis    hands R>L, neck and lower back  . Bimalleolar fracture of left ankle 11/19/2013   [place]  . Cancer ([place])   . Cataract   . Depression   . Esophageal reflux   . GERD with stricture 06/03/2007  . Glaucoma   . History of osteopenia    DEXA WNL 2008, 2015  . History of uterine cancer 1999   s/p hysterectomy  . HLD (hyperlipidemia)   . Narcolepsy   . Thyroid disease     Past Surgical History:  Procedure Laterality Date  . bilateral catarct removal  2010  . COLONOSCOPY  05/2003   WNL (Dr Velora Heckler)  . COLONOSCOPY  11/2017   3 small TA, diverticulisis, no f/u needed Carlean Purl)  . DEXA  03/2014   WNL T score +3.9  . ESOPHAGOGASTRODUODENOSCOPY  11/2017   dilated esoph stenosis, 8cm HH ([facility]ner)  . ESOPHAGOGASTRODUODENOSCOPY  12/2017   repeat dilation of esoph stenosis, 5cm HH - rec stay on PPI Carlean Purl)  . ORIF ANKLE FRACTURE Left 11/19/2013   Procedure: LEFT ANKLE FRACTURE OPEN TREATMENT BIMALLEOLAR ANKLE INCLUDES INTERNAL FIXATION ;  Surgeon: Johnny Bridge, MD  . RETINAL DETACHMENT REPAIR W/ SCLERAL BUCKLE LE Right 03/21/2016  . TOTAL ABDOMINAL HYSTERECTOMY  1999   uterine cancer     There were no vitals filed for this visit.  Subjective Assessment - 09/21/18 1529    Subjective  Patient is doing well and nothing is hurting.     Pertinent History  She had an episode where she felt not right and slumped to the floor, it was over by the time she got to the floor and then after a few minutes she was able to stand back up. She is 78 yo Female with history of low back pain, reports impaired balance and increased fear of falling; Reports increased history of severe falls. Her last fall occurred in Sept 2019, when she tripped over a floor transition; at that time she was unable to get back up; She reports suffering a bruise as a result of fall; Patient is currently not using any assistive device; She reports that she doesn't do a lot of walking but recently took a trip where she had to walk for long distances. She reports having to take increased rest breaks due to fatigue and back pain; Denies any LE pain;     Limitations  Standing;Walking    How long can you sit comfortably?  NA    How long can you stand comfortably?  increased pain with  prolonged standing >30 min    How long can you walk comfortably?  about 500 feet with fatigue/back pain;     Diagnostic tests  none recent    Patient Stated Goals  walk farther, reduce fall risk;     Currently in Pain?  No/denies    Pain Score  0-No pain       Treatment:   [employer] fitness  x 5 mins L 5  Side stepping on TM . 2 miles / hour x 5 mins left and 5 mins right    Resisted walking forward/backward 2.5 lbs x10. CGA    Leg press x 15 x 2 with 90 lbs, Verbal cues to complete slowly and not let knees snap back. 2nd set 105lbs   Single leg press 45# x15 each side. Verbal cues to slow down movement.     Step ups to 6 inch stool x 10. No UE suport. CGA   Eccentric step downs from 6 inch stool x 10 each LE. Visual cues for technique. Verbal cues to complete slow and tap heel.  BUE CGA    Pt educated throughout session about  proper posture and technique with exercises. Improved exercise technique, movement at target joints, use of target muscles after min to mod verbal, visual, tactile cues.                    PT Education - 09/21/18 1529    Education Details  HEP    Person(s) Educated  Patient    Methods  Explanation    Comprehension  Verbalized understanding;Returned demonstration;Need further instruction       PT Short Term Goals - 09/01/18 1428      PT SHORT TERM GOAL #1   Title  Patient will be adherent to HEP at least 3x a week to improve functional strength and balance for better safety at home.    Time  4    Period  Weeks    Status  New    Target Date  09/15/18      PT SHORT TERM GOAL #2   Title  Patient will increase six minute walk test distance to >1300 for progression to closer to age group norms and improve functional gait distance;     Time  4    Period  Weeks    Status  New    Target Date  09/15/18        PT Long Term Goals - 09/01/18 1427      PT LONG TERM GOAL #1   Title  Patient will increase six minute walk test distance to >1000 for progression to community ambulator and improve gait ability    Time  8    Period  Weeks    Status  New    Target Date  10/27/18      PT LONG TERM GOAL #2   Title  Patient will report a worst pain of 3/10 on VAS in  low back    to improve tolerance with ADLs and reduced symptoms with activities.     Time  8    Period  Weeks    Status  New    Target Date  10/27/18      PT LONG TERM GOAL #3   Title  Patient will increase BLE gross strength to 4+/5 as to improve functional strength for independent gait, increased standing tolerance and increased ADL ability.    Time  8    Period  Weeks    Status  New    Target Date  10/27/18      PT LONG TERM GOAL #4   Title  Pt will improve DGI by at least 3 points in order to demonstrate clinically significant improvement in balance and decreased risk for falls     Time  8    Period   Weeks    Status  New    Target Date  10/27/18      PT LONG TERM GOAL #5   Time  8            Plan - 09/21/18 1531    Clinical Impression Statement  Pt responded well to all interventions today.  Focus on core and hip strengthening in order to improve balance and ambulation.  Pt performed hip ABD with weight and single leg bridges against gravity..  Pt was able to complete core exercises, requiring cueing for proper form and hip alignment during seated and supine core activities. Pt would continue to benefit from skilled therapy services in order to improve LE and core strength, gait and functional mobility in order to decrease risk of falls and improve independence with mobility.    Rehab Potential  Good    Clinical Impairments Affecting Rehab Potential  motivated, good results from previous therapy    PT Frequency  2x / week    PT Duration  8 weeks    PT Treatment/Interventions  Aquatic Therapy;Cryotherapy;Electrical Stimulation;Moist Heat;Gait training;Stair training;Functional mobility training;Therapeutic activities;Therapeutic exercise;Balance training;Neuromuscular re-education;Patient/family education;Manual techniques;Passive range of motion;Energy conservation;ADLs/Self Care Home Management;Canalith Repostioning;Iontophoresis 4mg /ml Dexamethasone;Traction;Ultrasound;DME Instruction;Vestibular    PT Next Visit Plan  Retest R posterior canal with Dix-Hallpike using IR goggles, update additional outcome measures/goals, initiate HEP, work on balance    PT Home Exercise Plan  see pt instructions, medbridge code: [email or web address]    Consulted and Agree with Plan of Care  Patient       Patient will benefit from skilled therapeutic intervention in order to improve the following deficits and impairments:  Abnormal gait, Decreased endurance, Hypomobility, Cardiopulmonary status limiting activity, Decreased activity tolerance, Decreased strength, Pain, Difficulty walking, Decreased mobility,  Decreased balance, Postural dysfunction, Decreased safety awareness  Visit Diagnosis: Unsteadiness on feet  Muscle weakness (generalized)     Problem List Patient Active Problem List   Diagnosis Date Noted  . Urinary incontinence 03/15/2018  . Falls 03/15/2018  . Uterine cancer ([place]) 01/22/2018  . Prediabetes 10/14/2017  . Hypothyroidism 04/07/2015  . Vitamin D deficiency 04/02/2015  . Macrocytosis 03/28/2015  . Other fatigue 11/25/2014  . Advanced care planning/counseling discussion 03/15/2014  . Health maintenance examination 03/15/2014  . Postsurgical menopause 08/07/2011  . Medicare annual wellness visit, subsequent 08/06/2011  . Osteoarthritis 06/07/2009  . Narcolepsy without cataplexy 05/31/2008  . HLD (hyperlipidemia) 06/03/2007  . MDD (major depressive disorder), recurrent episode, severe ([place]) 06/03/2007  . GERD with stricture 06/03/2007    Alanson Puls, PT DPT 09/21/2018, 3:37 PM  [place] MAIN [facility] SERVICES [address] [place], [place], [address] Phone: [phone]   Fax:  [phone]  Name: Faith Santiago MRN: [id number] Date of Birth: 05-14-1940

## 2018-09-23 ENCOUNTER — Encounter: Payer: Self-pay | Admitting: Physical Therapy

## 2018-09-23 ENCOUNTER — Ambulatory Visit: Payer: Medicare Other | Admitting: Physical Therapy

## 2018-09-23 ENCOUNTER — Other Ambulatory Visit: Payer: Self-pay

## 2018-09-23 DIAGNOSIS — R278 Other lack of coordination: Secondary | ICD-10-CM

## 2018-09-23 DIAGNOSIS — R2681 Unsteadiness on feet: Secondary | ICD-10-CM

## 2018-09-23 DIAGNOSIS — M6281 Muscle weakness (generalized): Secondary | ICD-10-CM

## 2018-09-23 DIAGNOSIS — Z9181 History of falling: Secondary | ICD-10-CM

## 2018-09-23 NOTE — Therapy (Signed)
[place] MAIN [facility] SERVICES [address] [place], [place], [address] Phone: [phone]   Fax:  [phone]  Physical Therapy Treatment  Patient Details  Name: Faith Santiago MRN: [id number] Date of Birth: May 15, 1940 Referring Provider (PT): [name]   Encounter Date: 09/23/2018  PT End of Session - 09/23/18 1504    Visit Number  7    Number of Visits  17    Date for PT Re-Evaluation  10/27/18    PT Start Time  0300    PT Stop Time  0345    PT Time Calculation (min)  45 min    Equipment Utilized During Treatment  Gait belt    Activity Tolerance  Patient tolerated treatment well    Behavior During Therapy  [facility] for tasks assessed/performed       Past Medical History:  Diagnosis Date  . Anemia, unspecified   . Anxiety   . Arthritis    hands R>L, neck and lower back  . Bimalleolar fracture of left ankle 11/19/2013   [place]  . Cancer ([place])   . Cataract   . Depression   . Esophageal reflux   . GERD with stricture 06/03/2007  . Glaucoma   . History of osteopenia    DEXA WNL 2008, 2015  . History of uterine cancer 1999   s/p hysterectomy  . HLD (hyperlipidemia)   . Narcolepsy   . Thyroid disease     Past Surgical History:  Procedure Laterality Date  . bilateral catarct removal  2010  . COLONOSCOPY  05/2003   WNL (Dr Velora Heckler)  . COLONOSCOPY  11/2017   3 small TA, diverticulisis, no f/u needed Carlean Purl)  . DEXA  03/2014   WNL T score +3.9  . ESOPHAGOGASTRODUODENOSCOPY  11/2017   dilated esoph stenosis, 8cm HH ([facility])  . ESOPHAGOGASTRODUODENOSCOPY  12/2017   repeat dilation of esoph stenosis, 5cm HH - rec stay on PPI Carlean Purl)  . ORIF ANKLE FRACTURE Left 11/19/2013   Procedure: LEFT ANKLE FRACTURE OPEN TREATMENT BIMALLEOLAR ANKLE INCLUDES INTERNAL FIXATION ;  Surgeon: Johnny Bridge, MD  . RETINAL DETACHMENT REPAIR W/ SCLERAL BUCKLE LE Right 03/21/2016  . TOTAL ABDOMINAL HYSTERECTOMY  1999   uterine cancer     There were no vitals filed for this visit.  Subjective Assessment - 09/23/18 1503    Subjective  Patient is doing well and nothing is hurting.     Pertinent History  She had an episode where she felt not right and slumped to the floor, it was over by the time she got to the floor and then after a few minutes she was able to stand back up. She is 78 yo Female with history of low back pain, reports impaired balance and increased fear of falling; Reports increased history of severe falls. Her last fall occurred in Sept 2019, when she tripped over a floor transition; at that time she was unable to get back up; She reports suffering a bruise as a result of fall; Patient is currently not using any assistive device; She reports that she doesn't do a lot of walking but recently took a trip where she had to walk for long distances. She reports having to take increased rest breaks due to fatigue and back pain; Denies any LE pain;     Limitations  Standing;Walking    How long can you sit comfortably?  NA    How long can you stand comfortably?  increased pain with  prolonged standing >30 min    How long can you walk comfortably?  about 500 feet with fatigue/back pain;     Diagnostic tests  none recent    Patient Stated Goals  walk farther, reduce fall risk;     Currently in Pain?  No/denies    Pain Score  0-No pain        Treatment: Standing on airex beam:  Tandem stance on airex beam without rail assist 10 sec hold x4 each foot in front with CGA for safety and cues to improve upper trunk control for better balance control; Side stepping down airex beam without rail assist x3 laps each direction with cues to keep feet on beam and avoid stepping off; Standing with feet apart, BUE ball toss x10 unsupported with close supervision; Patient exhibits posterior loss of balance requiring cues for forward weight shift bosu ball lunges x 15 x 2 sets  stapping from air ex to 6 inc stool x 20 x 2 sets     Standing on 1/2 bolster (flat side up) Feet flat, apart, BUE wand flexion x10 reps with min A For safety; Patient able to keep balance well with minimal posterior loss of balance; stepping over hurdle x 10 fwd / bwd, 10 side to side with UE support    Pt educated throughout session about proper posture and technique with exercises. Improved exercise technique, movement at target joints, use of target muscles after min to mod verbal, visual, tactile cues.                    PT Education - 09/23/18 1503    Education Details  HEP    Person(s) Educated  Patient    Methods  Explanation    Comprehension  Verbalized understanding;Returned demonstration;Need further instruction       PT Short Term Goals - 09/01/18 1428      PT SHORT TERM GOAL #1   Title  Patient will be adherent to HEP at least 3x a week to improve functional strength and balance for better safety at home.    Time  4    Period  Weeks    Status  New    Target Date  09/15/18      PT SHORT TERM GOAL #2   Title  Patient will increase six minute walk test distance to >1300 for progression to closer to age group norms and improve functional gait distance;     Time  4    Period  Weeks    Status  New    Target Date  09/15/18        PT Long Term Goals - 09/01/18 1427      PT LONG TERM GOAL #1   Title  Patient will increase six minute walk test distance to >1000 for progression to community ambulator and improve gait ability    Time  8    Period  Weeks    Status  New    Target Date  10/27/18      PT LONG TERM GOAL #2   Title  Patient will report a worst pain of 3/10 on VAS in  low back    to improve tolerance with ADLs and reduced symptoms with activities.     Time  8    Period  Weeks    Status  New    Target Date  10/27/18      PT LONG TERM GOAL #3   Title  Patient  will increase BLE gross strength to 4+/5 as to improve functional strength for independent gait, increased standing tolerance and  increased ADL ability.    Time  8    Period  Weeks    Status  New    Target Date  10/27/18      PT LONG TERM GOAL #4   Title  Pt will improve DGI by at least 3 points in order to demonstrate clinically significant improvement in balance and decreased risk for falls     Time  8    Period  Weeks    Status  New    Target Date  10/27/18      PT LONG TERM GOAL #5   Time  8            Plan - 09/23/18 1504    Clinical Impression Statement  Patient instructed in LE strengthening and dynamic standing balance.  Patient requires cues for correct exercise technique including positioning and to improve ROM for better strengthening; Patient denies any increase in pain; and  responded well to cues with better posture and quad control; Patient would benefit from additional skilled PT intervention to improve strength and mobility and reduce fall risk.    Rehab Potential  Good    Clinical Impairments Affecting Rehab Potential  motivated, good results from previous therapy    PT Frequency  2x / week    PT Duration  8 weeks    PT Treatment/Interventions  Aquatic Therapy;Cryotherapy;Electrical Stimulation;Moist Heat;Gait training;Stair training;Functional mobility training;Therapeutic activities;Therapeutic exercise;Balance training;Neuromuscular re-education;Patient/family education;Manual techniques;Passive range of motion;Energy conservation;ADLs/Self Care Home Management;Canalith Repostioning;Iontophoresis 4mg /ml Dexamethasone;Traction;Ultrasound;DME Instruction;Vestibular    PT Next Visit Plan  Retest R posterior canal with Dix-Hallpike using IR goggles, update additional outcome measures/goals, initiate HEP, work on balance    PT Home Exercise Plan  see pt instructions, medbridge code: [email or web address]    Consulted and Agree with Plan of Care  Patient       Patient will benefit from skilled therapeutic intervention in order to improve the following deficits and impairments:  Abnormal gait, Decreased  endurance, Hypomobility, Cardiopulmonary status limiting activity, Decreased activity tolerance, Decreased strength, Pain, Difficulty walking, Decreased mobility, Decreased balance, Postural dysfunction, Decreased safety awareness  Visit Diagnosis: Unsteadiness on feet  Muscle weakness (generalized)  Other lack of coordination  At high risk for falls     Problem List Patient Active Problem List   Diagnosis Date Noted  . Urinary incontinence 03/15/2018  . Falls 03/15/2018  . Uterine cancer ([place]) 01/22/2018  . Prediabetes 10/14/2017  . Hypothyroidism 04/07/2015  . Vitamin D deficiency 04/02/2015  . Macrocytosis 03/28/2015  . Other fatigue 11/25/2014  . Advanced care planning/counseling discussion 03/15/2014  . Health maintenance examination 03/15/2014  . Postsurgical menopause 08/07/2011  . Medicare annual wellness visit, subsequent 08/06/2011  . Osteoarthritis 06/07/2009  . Narcolepsy without cataplexy 05/31/2008  . HLD (hyperlipidemia) 06/03/2007  . MDD (major depressive disorder), recurrent episode, severe ([place]) 06/03/2007  . GERD with stricture 06/03/2007    Alanson Puls, PT DPT 09/23/2018, 3:07 PM  [place] MAIN [facility] SERVICES [address] [place], [place], [address] Phone: [phone]   Fax:  [phone]  Name: Faith Santiago MRN: [id number] Date of Birth: 07/01/1940

## 2018-09-28 ENCOUNTER — Ambulatory Visit: Payer: Medicare Other | Admitting: Physical Therapy

## 2018-09-30 ENCOUNTER — Encounter: Payer: Self-pay | Admitting: Physical Therapy

## 2018-09-30 ENCOUNTER — Ambulatory Visit: Payer: Medicare Other | Admitting: Physical Therapy

## 2018-09-30 ENCOUNTER — Other Ambulatory Visit: Payer: Self-pay

## 2018-09-30 DIAGNOSIS — Z9181 History of falling: Secondary | ICD-10-CM

## 2018-09-30 DIAGNOSIS — G8929 Other chronic pain: Secondary | ICD-10-CM

## 2018-09-30 DIAGNOSIS — M6281 Muscle weakness (generalized): Secondary | ICD-10-CM

## 2018-09-30 DIAGNOSIS — R278 Other lack of coordination: Secondary | ICD-10-CM

## 2018-09-30 DIAGNOSIS — R2681 Unsteadiness on feet: Secondary | ICD-10-CM | POA: Diagnosis not present

## 2018-09-30 NOTE — Therapy (Signed)
[place] MAIN [name] SERVICES [address] [place], [place], [address] Phone: [phone]   Fax:  [phone]  Physical Therapy Treatment/ Discharge Summary  Patient Details  Name: Faith Santiago MRN: [id number] Date of Birth: 10/06/1940 Referring Provider (PT): [name]   Encounter Date: 09/30/2018  PT End of Session - 09/30/18 1516    Visit Number  8    Number of Visits  17    Date for PT Re-Evaluation  10/27/18    PT Start Time  0312    PT Stop Time  0356    PT Time Calculation (min)  44 min    Equipment Utilized During Treatment  Gait belt    Activity Tolerance  Patient tolerated treatment well    Behavior During Therapy  [facility] for tasks assessed/performed       Past Medical History:  Diagnosis Date  . Anemia, unspecified   . Anxiety   . Arthritis    hands R>L, neck and lower back  . Bimalleolar fracture of left ankle 11/19/2013   [place]  . Cancer ([place])   . Cataract   . Depression   . Esophageal reflux   . GERD with stricture 06/03/2007  . Glaucoma   . History of osteopenia    DEXA WNL 2008, 2015  . History of uterine cancer 1999   s/p hysterectomy  . HLD (hyperlipidemia)   . Narcolepsy   . Thyroid disease     Past Surgical History:  Procedure Laterality Date  . bilateral catarct removal  2010  . COLONOSCOPY  05/2003   WNL (Dr Velora Heckler)  . COLONOSCOPY  11/2017   3 small TA, diverticulisis, no f/u needed Carlean Purl)  . DEXA  03/2014   WNL T score +3.9  . ESOPHAGOGASTRODUODENOSCOPY  11/2017   dilated esoph stenosis, 8cm HH ([facility])  . ESOPHAGOGASTRODUODENOSCOPY  12/2017   repeat dilation of esoph stenosis, 5cm HH - rec stay on PPI Carlean Purl)  . ORIF ANKLE FRACTURE Left 11/19/2013   Procedure: LEFT ANKLE FRACTURE OPEN TREATMENT BIMALLEOLAR ANKLE INCLUDES INTERNAL FIXATION ;  Surgeon: Johnny Bridge, MD  . RETINAL DETACHMENT REPAIR W/ SCLERAL BUCKLE LE Right 03/21/2016  . TOTAL ABDOMINAL HYSTERECTOMY  1999   uterine cancer    There were no vitals filed for this visit.  Subjective Assessment - 09/30/18 1515    Subjective  Patient is doing well and nothing is hurting.     Pertinent History  She had an episode where she felt not right and slumped to the floor, it was over by the time she got to the floor and then after a few minutes she was able to stand back up. She is 78 yo Female with history of low back pain, reports impaired balance and increased fear of falling; Reports increased history of severe falls. Her last fall occurred in Sept 2019, when she tripped over a floor transition; at that time she was unable to get back up; She reports suffering a bruise as a result of fall; Patient is currently not using any assistive device; She reports that she doesn't do a lot of walking but recently took a trip where she had to walk for long distances. She reports having to take increased rest breaks due to fatigue and back pain; Denies any LE pain;     Limitations  Standing;Walking    How long can you sit comfortably?  NA    How long can you stand comfortably?  increased pain  with prolonged standing >30 min    How long can you walk comfortably?  about 500 feet with fatigue/back pain;     Diagnostic tests  none recent    Patient Stated Goals  walk farther, reduce fall risk;     Currently in Pain?  No/denies    Pain Score  0-No pain      Treatment:   OUTCOME MEASURES: TEST Outcome Interpretation  DGI 19/24sec 24/24 is normal  10 meter walk test      1.20          m/s <1.0 m/s indicates increased risk for falls; limited community ambulator  Timed up and Go    10.14             sec <14 sec indicates increased risk for falls  6 minute walk test    800          Feet 1000 feet is community ambulator  5 x sit to stand  12.58 > 14 sec is no falls risk                              PT Education - 09/30/18 1516    Education Details  Progress in therapy    Person(s) Educated   Patient    Methods  Explanation    Comprehension  Verbalized understanding       PT Short Term Goals - 09/30/18 1544      PT SHORT TERM GOAL #1   Title  Patient will be adherent to HEP at least 3x a week to improve functional strength and balance for better safety at home.    Time  4    Period  Weeks    Status  Achieved    Target Date  09/30/18      PT SHORT TERM GOAL #2   Title  Patient will increase six minute walk test distance to >1300 for progression to closer to age group norms and improve functional gait distance;     Time  4    Period  Weeks    Status  Partially Met    Target Date  09/30/18        PT Long Term Goals - 09/30/18 1545      PT LONG TERM GOAL #1   Title  Patient will increase six minute walk test distance to >1000 for progression to community ambulator and improve gait ability    Time  8    Period  Weeks    Status  Achieved    Target Date  09/30/18      PT LONG TERM GOAL #2   Title  Patient will report a worst pain of 3/10 on VAS in  low back    to improve tolerance with ADLs and reduced symptoms with activities.     Time  8    Period  Weeks    Status  Achieved    Target Date  09/30/18      PT LONG TERM GOAL #3   Title  Patient will increase BLE gross strength to 4+/5 as to improve functional strength for independent gait, increased standing tolerance and increased ADL ability.    Time  8    Period  Weeks    Status  Achieved    Target Date  09/30/18      PT LONG TERM GOAL #4   Title  Pt will improve DGI by at  least 3 points in order to demonstrate clinically significant improvement in balance and decreased risk for falls     Time  8    Period  Weeks    Status  New      PT LONG TERM GOAL #5   Time  8            Plan - 09/30/18 1533    Clinical Impression Statement  Patient's condition has improved in response to therapy. Maximum improvement has been obtained.Patient reports that she is feeling stronger and better balanced. She  performed outcome measures today and progressed and met her goals. She will be DC to her HEP.    Rehab Potential  Good    Clinical Impairments Affecting Rehab Potential  motivated, good results from previous therapy    PT Frequency  2x / week    PT Duration  8 weeks    PT Treatment/Interventions  Aquatic Therapy;Cryotherapy;Electrical Stimulation;Moist Heat;Gait training;Stair training;Functional mobility training;Therapeutic activities;Therapeutic exercise;Balance training;Neuromuscular re-education;Patient/family education;Manual techniques;Passive range of motion;Energy conservation;ADLs/Self Care Home Management;Canalith Repostioning;Iontophoresis 22m/ml Dexamethasone;Traction;Ultrasound;DME Instruction;Vestibular    PT Next Visit Plan  Retest R posterior canal with Dix-Hallpike using IR goggles, update additional outcome measures/goals, initiate HEP, work on balance    PT Home Exercise Plan  see pt instructions, medbridge code: [email or web address]    Consulted and Agree with Plan of Care  Patient       Patient will benefit from skilled therapeutic intervention in order to improve the following deficits and impairments:  Abnormal gait, Decreased endurance, Hypomobility, Cardiopulmonary status limiting activity, Decreased activity tolerance, Decreased strength, Pain, Difficulty walking, Decreased mobility, Decreased balance, Postural dysfunction, Decreased safety awareness  Visit Diagnosis: 1. Muscle weakness (generalized)   2. Unsteadiness on feet   3. Other lack of coordination   4. At high risk for falls   5. Chronic bilateral low back pain without sciatica        Problem List Patient Active Problem List   Diagnosis Date Noted  . Urinary incontinence 03/15/2018  . Falls 03/15/2018  . [place] 01/22/[place]  . Prediabetes 10/14/2017  . Hypothyroidism 04/07/2015  . Vitamin D deficiency 04/02/2015  . Macrocytosis 03/28/2015  . Other fatigue 11/25/2014  . Advanced care  planning/counseling discussion 03/15/2014  . Health maintenance examination 03/15/2014  . Postsurgical menopause 08/07/2011  . Medicare annual wellness visit, subsequent 08/06/2011  . Osteoarthritis 06/07/2009  . Narcolepsy without cataplexy 05/31/2008  . HLD (hyperlipidemia) 06/03/2007  . MDD (major depressive disorder), recurrent episode, severe [place] 06/03/2007  . GERD with stricture 06/03/2007    MAlanson Puls PT DPT 09/30/2018, 3:46 PM  [place] [facility] [address] [address]Phone: [phone]  Fax:  [phone] Name: Faith MERCERMRN: [id number] of Birth: 612-13-1942

## 2018-10-05 ENCOUNTER — Ambulatory Visit: Payer: Medicare Other | Admitting: Physical Therapy

## 2018-10-07 ENCOUNTER — Ambulatory Visit: Payer: Medicare Other | Admitting: Physical Therapy

## 2018-10-09 ENCOUNTER — Other Ambulatory Visit: Payer: Self-pay | Admitting: Family Medicine

## 2018-10-12 ENCOUNTER — Ambulatory Visit: Payer: Medicare Other | Admitting: Physical Therapy

## 2018-10-14 ENCOUNTER — Ambulatory Visit: Payer: Medicare Other | Admitting: Physical Therapy

## 2018-10-19 ENCOUNTER — Ambulatory Visit: Payer: Medicare Other | Admitting: Physical Therapy

## 2018-10-21 ENCOUNTER — Ambulatory Visit: Payer: Medicare Other | Admitting: Physical Therapy

## 2018-10-29 ENCOUNTER — Ambulatory Visit: Payer: Self-pay

## 2018-10-29 ENCOUNTER — Other Ambulatory Visit (INDEPENDENT_AMBULATORY_CARE_PROVIDER_SITE_OTHER): Payer: Medicare Other

## 2018-10-29 ENCOUNTER — Other Ambulatory Visit: Payer: Self-pay | Admitting: Family Medicine

## 2018-10-29 DIAGNOSIS — D7589 Other specified diseases of blood and blood-forming organs: Secondary | ICD-10-CM

## 2018-10-29 DIAGNOSIS — E785 Hyperlipidemia, unspecified: Secondary | ICD-10-CM

## 2018-10-29 DIAGNOSIS — E039 Hypothyroidism, unspecified: Secondary | ICD-10-CM

## 2018-10-29 DIAGNOSIS — E559 Vitamin D deficiency, unspecified: Secondary | ICD-10-CM | POA: Diagnosis not present

## 2018-10-29 DIAGNOSIS — R7303 Prediabetes: Secondary | ICD-10-CM

## 2018-10-29 LAB — CBC WITH DIFFERENTIAL/PLATELET
Basophils Absolute: 0 10*3/uL (ref 0.0–0.1)
Basophils Relative: 0.5 % (ref 0.0–3.0)
Eosinophils Absolute: 0.1 10*3/uL (ref 0.0–0.7)
Eosinophils Relative: 2.1 % (ref 0.0–5.0)
HCT: 41 % (ref 36.0–46.0)
Hemoglobin: 14.1 g/dL (ref 12.0–15.0)
Lymphocytes Relative: 39.7 % (ref 12.0–46.0)
Lymphs Abs: 2 10*3/uL (ref 0.7–4.0)
MCHC: 34.4 g/dL (ref 30.0–36.0)
MCV: 101.2 fl — ABNORMAL HIGH (ref 78.0–100.0)
Monocytes Absolute: 0.5 10*3/uL (ref 0.1–1.0)
Monocytes Relative: 9.3 % (ref 3.0–12.0)
Neutro Abs: 2.4 10*3/uL (ref 1.4–7.7)
Neutrophils Relative %: 48.4 % (ref 43.0–77.0)
Platelets: 250 10*3/uL (ref 150.0–400.0)
RBC: 4.05 Mil/uL (ref 3.87–5.11)
RDW: 13.6 % (ref 11.5–15.5)
WBC: 5 10*3/uL (ref 4.0–10.5)

## 2018-10-29 LAB — LIPID PANEL
Cholesterol: 309 mg/dL — ABNORMAL HIGH (ref 0–200)
HDL: 66.4 mg/dL (ref >39.00)
NonHDL: 242.5
Total CHOL/HDL Ratio: 5
Triglycerides: 293 mg/dL — ABNORMAL HIGH (ref 0.0–149.0)
VLDL: 58.6 mg/dL — ABNORMAL HIGH (ref 0.0–40.0)

## 2018-10-29 LAB — COMPREHENSIVE METABOLIC PANEL
ALT: 16 U/L (ref 0–35)
AST: 21 U/L (ref 0–37)
Albumin: 4.2 g/dL (ref 3.5–5.2)
Alkaline Phosphatase: 78 U/L (ref 39–117)
BUN: 17 mg/dL (ref 6–23)
CO2: 26 mEq/L (ref 19–32)
Calcium: 9.3 mg/dL (ref 8.4–10.5)
Chloride: 103 mEq/L (ref 96–112)
Creatinine, Ser: 0.93 mg/dL (ref 0.40–1.20)
GFR: 58.6 mL/min — ABNORMAL LOW (ref >60.00)
Glucose, Bld: 100 mg/dL — ABNORMAL HIGH (ref 70–99)
Potassium: 4.6 mEq/L (ref 3.5–5.1)
Sodium: 139 mEq/L (ref 135–145)
Total Bilirubin: 0.5 mg/dL (ref 0.2–1.2)
Total Protein: 6.5 g/dL (ref 6.0–8.3)

## 2018-10-29 LAB — HEMOGLOBIN A1C: Hgb A1c MFr Bld: 5.9 % (ref 4.6–6.5)

## 2018-10-29 LAB — TSH: TSH: 1.73 u[IU]/mL (ref 0.35–4.50)

## 2018-10-29 LAB — T4, FREE: Free T4: 0.67 ng/dL (ref 0.60–1.60)

## 2018-10-29 LAB — VITAMIN D 25 HYDROXY (VIT D DEFICIENCY, FRACTURES): VITD: 22.46 ng/mL — ABNORMAL LOW (ref 30.00–100.00)

## 2018-10-29 LAB — LDL CHOLESTEROL, DIRECT: Direct LDL: 205 mg/dL

## 2018-11-02 ENCOUNTER — Encounter: Payer: Self-pay | Admitting: Family Medicine

## 2018-11-02 ENCOUNTER — Other Ambulatory Visit: Payer: Self-pay

## 2018-11-02 ENCOUNTER — Ambulatory Visit (INDEPENDENT_AMBULATORY_CARE_PROVIDER_SITE_OTHER): Payer: Medicare Other | Admitting: Family Medicine

## 2018-11-02 VITALS — BP 108/66 | HR 96 | Temp 97.6°F | Ht 64.0 in | Wt 207.7 lb

## 2018-11-02 DIAGNOSIS — N898 Other specified noninflammatory disorders of vagina: Secondary | ICD-10-CM | POA: Insufficient documentation

## 2018-11-02 DIAGNOSIS — Z7189 Other specified counseling: Secondary | ICD-10-CM | POA: Diagnosis not present

## 2018-11-02 DIAGNOSIS — Z0000 Encounter for general adult medical examination without abnormal findings: Secondary | ICD-10-CM

## 2018-11-02 DIAGNOSIS — W19XXXA Unspecified fall, initial encounter: Secondary | ICD-10-CM

## 2018-11-02 DIAGNOSIS — E785 Hyperlipidemia, unspecified: Secondary | ICD-10-CM

## 2018-11-02 DIAGNOSIS — F332 Major depressive disorder, recurrent severe without psychotic features: Secondary | ICD-10-CM

## 2018-11-02 DIAGNOSIS — E559 Vitamin D deficiency, unspecified: Secondary | ICD-10-CM

## 2018-11-02 DIAGNOSIS — E039 Hypothyroidism, unspecified: Secondary | ICD-10-CM

## 2018-11-02 DIAGNOSIS — Z8542 Personal history of malignant neoplasm of other parts of uterus: Secondary | ICD-10-CM

## 2018-11-02 DIAGNOSIS — R7303 Prediabetes: Secondary | ICD-10-CM

## 2018-11-02 LAB — LIPID PANEL
Cholesterol: 288 mg/dL — ABNORMAL HIGH (ref 0–200)
HDL: 68.5 mg/dL (ref >39.00)
NonHDL: 219.79
Total CHOL/HDL Ratio: 4
Triglycerides: 316 mg/dL — ABNORMAL HIGH (ref 0.0–149.0)
VLDL: 63.2 mg/dL — ABNORMAL HIGH (ref 0.0–40.0)

## 2018-11-02 LAB — LDL CHOLESTEROL, DIRECT: Direct LDL: 182 mg/dL

## 2018-11-02 NOTE — Assessment & Plan Note (Addendum)
Reassuring exam. Wet prep sent.  rec urinalysis to r/o hematuria as cause - she was unable to provide sample.

## 2018-11-02 NOTE — Assessment & Plan Note (Addendum)
Had another fall recently despite completing balance training program, fortunately no injury occurred. Clarified that no syncope occurred.

## 2018-11-02 NOTE — Assessment & Plan Note (Addendum)
Marked deterioration in LDL, unclear why. Will recheck FLP today. Provided with low chol diet handout. If LDL remaining >190, consideration for statin.  The 10-year ASCVD risk score Faith Santiago DC Faith Santiago., et al., 2013) is: 13.5%   Values used to calculate the score:     Age: 78 years     Sex: Female     Is Non-Hispanic African American: No     Diabetic: No     Tobacco smoker: No     Systolic Blood Pressure: 949 mmHg     Is BP treated: No     HDL Cholesterol: 66.4 mg/dL     Total Cholesterol: 309 mg/dL

## 2018-11-02 NOTE — Assessment & Plan Note (Signed)

## 2018-11-02 NOTE — Assessment & Plan Note (Signed)
S/p abdominal hysterectomy and radiation therapy.

## 2018-11-02 NOTE — Progress Notes (Addendum)
This visit was conducted in person.  BP 108/66 (BP Location: Left Arm, Patient Position: Sitting, Cuff Size: Normal)   Pulse 96   Temp 97.6 F (36.4 C) (Temporal)   Ht 5\' 4"  (1.626 m)   Wt 207 lb 11.2 oz (94.2 kg)   SpO2 96%   BMI 35.65 kg/m    CC: AMW Subjective:    Patient ID: Faith Santiago, female    DOB: 04-20-1940, 78 y.o.   MRN: [id number]  HPI: Faith Santiago is a 78 y.o. female presenting on 11/02/2018 for Medicare Wellness (Vaginal discarge x 3 months. // pt reports fainting about 5 weeks ago, was not checked out at ED or UC. Pt fell but did not injure herself, husband was present at the time of the fall. )   Did not see health coach this year. Not yet due for CPE.   Fall 5 wks ago - she got up from chair 7pm in the evening, walked into kitchen and felt strange feeling in her head. She does remember falling and hitting the floor, denies LOC. No vertigo or true syncope. Husband witnessed fall. Not postictal. No seizure activity endorsed. Was able to get up without assistance - fortunately no injury. She did complete balance training/fall prevention PT program earlier this year.   3 mo h/o brown vaginal discharge without pruritis or rash. Notices it on underwear. No bleeding or white discharge. No abd pain. No skin changes.   Depression - followed by psychiatrist. Noticing more depressed mood, husband noticing increasing sleep. Will touch base with Dr Gretel Acre about this.   Preventative: COLONOSCOPY 11/2017 - 3 small TA, diverticulosis, no f/u needed Carlean Purl) 3D mammogram 11/2017 WNL. Declines breast exam. Occasionally does this at home.  Well woman exam -s/ptotalhysterectomy 1999(ovaries removed)for uterine cancer.She did have radiation afterwards. Pap smear WNL until 08/2011. Tapered off estrogen therapy. Pelvic exam alone performed 11/2017, WNL.  DEXA Date: 03/2014 WNL T score +3.9  Fluyearly. Pneumovax 2011, prevnar 2015.  Td 2009.  Shingrix - interested in  this. Discussed to get at pharmacy.  Advanced directives - living will at home. Would want husband to be HCPOA. Will bring me copy.  Seat belt use discussed Sunscreen use discussed, no changing moles on skin. Non smoker Alcohol - 1 glass wine/day Dentist Q6 mo Eye exam yearly Bowel - no constipation Bladder - no incontinence  Caffeine: couple cups/day  Lives with husband, 2 grown children, 1 cat  Occupation: retired, was OT for [employer]  Activity: planning to sign up for Y Diet: overeating, good amt water, vegetables daily, occasional fruits     Relevant past medical, surgical, family and social history reviewed and updated as indicated. Interim medical history since our last visit reviewed. Allergies and medications reviewed and updated. Outpatient Medications Prior to Visit  Medication Sig Dispense Refill  . ALPRAZolam (XANAX) 0.25 MG tablet Take 1 tablet (0.25 mg total) by mouth at bedtime as needed for anxiety. 30 tablet 2  . ARIPiprazole (ABILIFY) 10 MG tablet Take 1 tablet (10 mg total) by mouth daily. 90 tablet 3  . Cholecalciferol (VITAMIN D) 50 MCG (2000 UT) CAPS Take 1 capsule (2,000 Units total) by mouth daily. 30 capsule   . levothyroxine (SYNTHROID) 50 MCG tablet TAKE 1 TABLET (50 MCG TOTAL) BY MOUTH DAILY. 90 tablet 0  . omeprazole (PRILOSEC) 40 MG capsule Take 1 capsule (40 mg total) by mouth daily before breakfast. 90 capsule 3  . venlafaxine XR (EFFEXOR-XR) 150 MG 24 hr  capsule Take 1 capsule (150 mg total) by mouth daily with breakfast. 90 capsule 1   No facility-administered medications prior to visit.      Per HPI unless specifically indicated in ROS section below Review of Systems Objective:    BP 108/66 (BP Location: Left Arm, Patient Position: Sitting, Cuff Size: Normal)   Pulse 96   Temp 97.6 F (36.4 C) (Temporal)   Ht 5\' 4"  (1.626 m)   Wt 207 lb 11.2 oz (94.2 kg)   SpO2 96%   BMI 35.65 kg/m   Wt Readings from Last 3 Encounters:  11/02/18 207 lb  11.2 oz (94.2 kg)  05/18/18 207 lb 9.6 oz (94.2 kg)  12/16/17 199 lb 12.8 oz (90.6 kg)    Physical Exam Vitals signs and nursing note reviewed.  Constitutional:      General: She is not in acute distress.    Appearance: Normal appearance. She is well-developed. She is not ill-appearing.  HENT:     Head: Normocephalic and atraumatic.     Right Ear: Hearing, tympanic membrane, ear canal and external ear normal.     Left Ear: Hearing, tympanic membrane, ear canal and external ear normal.     Nose: Nose normal.     Mouth/Throat:     Mouth: Mucous membranes are moist.     Pharynx: Uvula midline. No oropharyngeal exudate or posterior oropharyngeal erythema.  Eyes:     General: No scleral icterus.    Extraocular Movements: Extraocular movements intact.     Conjunctiva/sclera: Conjunctivae normal.     Pupils: Pupils are equal, round, and reactive to light.  Neck:     Musculoskeletal: Normal range of motion and neck supple.     Vascular: No carotid bruit.  Cardiovascular:     Rate and Rhythm: Normal rate and regular rhythm.     Pulses: Normal pulses.          Radial pulses are 2+ on the right side and 2+ on the left side.     Heart sounds: Normal heart sounds. No murmur.  Pulmonary:     Effort: Pulmonary effort is normal. No respiratory distress.     Breath sounds: No wheezing, rhonchi or rales.     Comments: Crackles bibasilarly Abdominal:     General: Abdomen is flat. Bowel sounds are normal. There is no distension.     Palpations: Abdomen is soft. There is no mass.     Tenderness: There is no abdominal tenderness. There is no guarding or rebound.     Hernia: No hernia is present.  Genitourinary:    Exam position: Supine.     Labia:        Right: No rash, tenderness or lesion.        Left: No rash, tenderness or lesion.      Urethra: No prolapse.     Vagina: Normal. No vaginal discharge.     Comments: Cervix/uterus absent. Ovaries also absent by history. Few abrasions anterior  vaginal area.  Musculoskeletal: Normal range of motion.  Lymphadenopathy:     Cervical: No cervical adenopathy.  Skin:    General: Skin is warm and dry.     Findings: No rash.  Neurological:     General: No focal deficit present.     Mental Status: She is alert and oriented to person, place, and time.     Comments:  CN grossly intact, station and gait intact Recall 3/3 Calculation D-L-R-O-W  Psychiatric:  Mood and Affect: Mood normal.        Behavior: Behavior normal.        Thought Content: Thought content normal.        Judgment: Judgment normal.     Comments: Mild psychomotor depression       Results for orders placed or performed in visit on 10/29/18  T4, free  Result Value Ref Range   Free T4 0.67 0.60 - 1.60 ng/dL  CBC with Differential/Platelet  Result Value Ref Range   WBC 5.0 4.0 - 10.5 K/uL   RBC 4.05 3.87 - 5.11 Mil/uL   Hemoglobin 14.1 12.0 - 15.0 g/dL   HCT 41.0 36.0 - 46.0 %   MCV 101.2 (H) 78.0 - 100.0 fl   MCHC 34.4 30.0 - 36.0 g/dL   RDW 13.6 11.5 - 15.5 %   Platelets 250.0 150.0 - 400.0 K/uL   Neutrophils Relative % 48.4 43.0 - 77.0 %   Lymphocytes Relative 39.7 12.0 - 46.0 %   Monocytes Relative 9.3 3.0 - 12.0 %   Eosinophils Relative 2.1 0.0 - 5.0 %   Basophils Relative 0.5 0.0 - 3.0 %   Neutro Abs 2.4 1.4 - 7.7 K/uL   Lymphs Abs 2.0 0.7 - 4.0 K/uL   Monocytes Absolute 0.5 0.1 - 1.0 K/uL   Eosinophils Absolute 0.1 0.0 - 0.7 K/uL   Basophils Absolute 0.0 0.0 - 0.1 K/uL  Hemoglobin A1c  Result Value Ref Range   Hgb A1c MFr Bld 5.9 4.6 - 6.5 %  TSH  Result Value Ref Range   TSH 1.73 0.35 - 4.50 uIU/mL  Comprehensive metabolic panel  Result Value Ref Range   Sodium 139 135 - 145 mEq/L   Potassium 4.6 3.5 - 5.1 mEq/L   Chloride 103 96 - 112 mEq/L   CO2 26 19 - 32 mEq/L   Glucose, Bld 100 (H) 70 - 99 mg/dL   BUN 17 6 - 23 mg/dL   Creatinine, Ser 0.93 0.40 - 1.20 mg/dL   Total Bilirubin 0.5 0.2 - 1.2 mg/dL   Alkaline Phosphatase 78 39  - 117 U/L   AST 21 0 - 37 U/L   ALT 16 0 - 35 U/L   Total Protein 6.5 6.0 - 8.3 g/dL   Albumin 4.2 3.5 - 5.2 g/dL   Calcium 9.3 8.4 - 10.5 mg/dL   GFR 58.60 (L) >60.00 mL/min  Lipid panel  Result Value Ref Range   Cholesterol 309 (H) 0 - 200 mg/dL   Triglycerides 293.0 (H) 0.0 - 149.0 mg/dL   HDL 66.40 >39.00 mg/dL   VLDL 58.6 (H) 0.0 - 40.0 mg/dL   Total CHOL/HDL Ratio 5    NonHDL 242.50   VITAMIN D 25 Hydroxy (Vit-D Deficiency, Fractures)  Result Value Ref Range   VITD 22.46 (L) 30.00 - 100.00 ng/mL  LDL cholesterol, direct  Result Value Ref Range   Direct LDL 205.0 mg/dL   Depression screen [facility] 2/9 11/02/2018 10/15/2017 08/13/2016 04/02/2016 06/01/2015  Decreased Interest 3 0 3 0 3  Down, Depressed, Hopeless 3 0 3 0 3  PHQ - 2 Score 6 0 6 0 6  Altered sleeping 3 0 1 - 3  Tired, decreased energy 3 0 3 - 3  Change in appetite 1 0 1 - 3  Feeling bad or failure about yourself  3 0 1 - 3  Trouble concentrating 0 0 1 - 3  Moving slowly or fidgety/restless 0 0 0 - 2  Suicidal thoughts  0 0 0 - 2  PHQ-9 Score 16 0 13 - 25  Difficult doing work/chores - Not difficult at all Somewhat difficult - Very difficult    Assessment & Plan:   Problem List Items Addressed This Visit    Vitamin D deficiency    Had been off vit D - will restart 2000 IU daily.       Vaginal discharge    Reassuring exam. Wet prep sent.  rec urinalysis to r/o hematuria as cause - she was unable to provide sample.       Relevant Orders   WET PREP BY MOLECULAR PROBE   Prediabetes    Reviewed with patient.      Medicare annual wellness visit, subsequent - Primary    I have personally reviewed the Medicare Annual Wellness questionnaire and have noted 1. The patient's medical and social history 2. Their use of alcohol, tobacco or illicit drugs 3. Their current medications and supplements 4. The patient's functional ability including ADL's, fall risks, home safety risks and hearing or visual impairment.  Cognitive function has been assessed and addressed as indicated.  5. Diet and physical activity 6. Evidence for depression or mood disorders The patients weight, height, BMI have been recorded in the chart. I have made referrals, counseling and provided education to the patient based on review of the above and I have provided the pt with a written personalized care plan for preventive services. Provider list updated.. See scanned questionairre as needed for further documentation. Reviewed preventative protocols and updated unless pt declined.       MDD (major depressive disorder), recurrent episode, severe ([place])    Deterioration endorsed - she will call to f/u with psych. No SI/HI.       Hypothyroidism    Chronic, stable. Continue current regimen.       HLD (hyperlipidemia)    Marked deterioration in LDL, unclear why. Will recheck FLP today. Provided with low chol diet handout. If LDL remaining >190, consideration for statin.  The 10-year ASCVD risk score Mikey Bussing DC Brooke Bonito., et al., 2013) is: 13.5%   Values used to calculate the score:     Age: 28 years     Sex: Female     Is Non-Hispanic African American: No     Diabetic: No     Tobacco smoker: No     Systolic Blood Pressure: 426 mmHg     Is BP treated: No     HDL Cholesterol: 66.4 mg/dL     Total Cholesterol: 309 mg/dL       Relevant Orders   Lipid panel   History of uterine cancer    S/p abdominal hysterectomy and radiation therapy.       Falls    Had another fall recently despite completing balance training program, fortunately no injury occurred. Clarified that no syncope occurred.       Advanced care planning/counseling discussion    Advanced directives - living will at home. Would want husband to be HCPOA. Will bring me copy.           No orders of the defined types were placed in this encounter.  Orders Placed This Encounter  Procedures  . WET PREP BY MOLECULAR PROBE  . Lipid panel    Patient  instructions: If interested, check with pharmacy about new 2 shot shingles series (shingrix).  Bring me copy of your advanced directive.  Pelvic exam today.  Cholesterol levels are too high! Recheck cholesterol level today. If  LDL staying >190, we will talk about starting cholesterol medicine. Also work on low cholesterol diet (handout provided today).  Touch base with Dr Sharla Kidney about mood.  Return as needed or in 6 months for follow up cholesterol.   Follow up plan: No follow-ups on file.  [name], MD

## 2018-11-02 NOTE — Assessment & Plan Note (Signed)
Chronic, stable. Continue current regimen. 

## 2018-11-02 NOTE — Assessment & Plan Note (Addendum)
Deterioration endorsed - she will call to f/u with psych. No SI/HI.

## 2018-11-02 NOTE — Assessment & Plan Note (Signed)
Advanced directives - living will at home. Would want husband to be HCPOA. Will bring me copy.

## 2018-11-02 NOTE — Assessment & Plan Note (Signed)
Had been off vit D - will restart 2000 IU daily.

## 2018-11-02 NOTE — Patient Instructions (Addendum)
If interested, check with pharmacy about new 2 shot shingles series (shingrix).  Bring me copy of your advanced directive.  Pelvic exam today Cholesterol levels are too high! Recheck cholesterol level today. If LDL staying >190, we will talk about starting cholesterol medicine. Also work on low cholesterol diet (handout provided today).  Touch base with Dr Sharla Kidney about mood.  Return as needed or in 6 months for follow up cholesterol.   Health Maintenance After Age 63 After age 40, you are at a higher risk for certain long-term diseases and infections as well as injuries from falls. Falls are a major cause of broken bones and head injuries in people who are older than age 63. Getting regular preventive care can help to keep you healthy and well. Preventive care includes getting regular testing and making lifestyle changes as recommended by your health care provider. Talk with your health care provider about:  Which screenings and tests you should have. A screening is a test that checks for a disease when you have no symptoms.  A diet and exercise plan that is right for you. What should I know about screenings and tests to prevent falls? Screening and testing are the best ways to find a health problem early. Early diagnosis and treatment give you the best chance of managing medical conditions that are common after age 65. Certain conditions and lifestyle choices may make you more likely to have a fall. Your health care provider may recommend:  Regular vision checks. Poor vision and conditions such as cataracts can make you more likely to have a fall. If you wear glasses, make sure to get your prescription updated if your vision changes.  Medicine review. Work with your health care provider to regularly review all of the medicines you are taking, including over-the-counter medicines. Ask your health care provider about any side effects that may make you more likely to have a fall. Tell your health  care provider if any medicines that you take make you feel dizzy or sleepy.  Osteoporosis screening. Osteoporosis is a condition that causes the bones to get weaker. This can make the bones weak and cause them to break more easily.  Blood pressure screening. Blood pressure changes and medicines to control blood pressure can make you feel dizzy.  Strength and balance checks. Your health care provider may recommend certain tests to check your strength and balance while standing, walking, or changing positions.  Foot health exam. Foot pain and numbness, as well as not wearing proper footwear, can make you more likely to have a fall.  Depression screening. You may be more likely to have a fall if you have a fear of falling, feel emotionally low, or feel unable to do activities that you used to do.  Alcohol use screening. Using too much alcohol can affect your balance and may make you more likely to have a fall. What actions can I take to lower my risk of falls? General instructions  Talk with your health care provider about your risks for falling. Tell your health care provider if: ? You fall. Be sure to tell your health care provider about all falls, even ones that seem minor. ? You feel dizzy, sleepy, or off-balance.  Take over-the-counter and prescription medicines only as told by your health care provider. These include any supplements.  Eat a healthy diet and maintain a healthy weight. A healthy diet includes low-fat dairy products, low-fat (lean) meats, and fiber from whole grains, beans, and lots of  fruits and vegetables. Home safety  Remove any tripping hazards, such as rugs, cords, and clutter.  Install safety equipment such as grab bars in bathrooms and safety rails on stairs.  Keep rooms and walkways well-lit. Activity   Follow a regular exercise program to stay fit. This will help you maintain your balance. Ask your health care provider what types of exercise are appropriate  for you.  If you need a cane or walker, use it as recommended by your health care provider.  Wear supportive shoes that have nonskid soles. Lifestyle  Do not drink alcohol if your health care provider tells you not to drink.  If you drink alcohol, limit how much you have: ? 0-1 drink a day for women. ? 0-2 drinks a day for men.  Be aware of how much alcohol is in your drink. In the [place] one drink equals one typical bottle of beer (12 oz), one-half glass of wine (5 oz), or one shot of hard liquor (1 oz).  Do not use any products that contain nicotine or tobacco, such as cigarettes and e-cigarettes. If you need help quitting, ask your health care provider. Summary  Having a healthy lifestyle and getting preventive care can help to protect your health and wellness after age 62.  Screening and testing are the best way to find a health problem early and help you avoid having a fall. Early diagnosis and treatment give you the best chance for managing medical conditions that are more common for people who are older than age 39.  Falls are a major cause of broken bones and head injuries in people who are older than age 76. Take precautions to prevent a fall at home.  Work with your health care provider to learn what changes you can make to improve your health and wellness and to prevent falls. This information is not intended to replace advice given to you by your health care provider. Make sure you discuss any questions you have with your health care provider. Document Released: 02/12/2017 Document Revised: 07/23/2018 Document Reviewed: 02/12/2017 Elsevier Patient Education  2020 [employer].

## 2018-11-02 NOTE — Assessment & Plan Note (Addendum)
Reviewed with patient

## 2018-11-03 LAB — WET PREP BY MOLECULAR PROBE
Candida species: NOT DETECTED
Gardnerella vaginalis: NOT DETECTED
MICRO NUMBER:: 683703
SPECIMEN QUALITY:: ADEQUATE
Trichomonas vaginosis: NOT DETECTED

## 2018-11-04 ENCOUNTER — Other Ambulatory Visit: Payer: Self-pay | Admitting: Family Medicine

## 2018-11-04 LAB — POCT URINALYSIS DIPSTICK
Bilirubin, UA: NEGATIVE
Blood, UA: NEGATIVE
Glucose, UA: NEGATIVE
Ketones, UA: NEGATIVE
Leukocytes, UA: NEGATIVE
Nitrite, UA: NEGATIVE
Protein, UA: NEGATIVE
Spec Grav, UA: 1.02 (ref 1.010–1.025)
Urobilinogen, UA: 0.2 E.U./dL
pH, UA: 5.5 (ref 5.0–8.0)

## 2018-11-04 MED ORDER — ATORVASTATIN CALCIUM 20 MG PO TABS: 20.0000 mg | ORAL_TABLET | Freq: Every day | ORAL | 6 refills | Status: DC

## 2018-11-04 NOTE — Progress Notes (Signed)
Patient dropped off urine for U/A 11/04/18 - normal result This is resulted in Epic and sent to Dr Danise Mina to sign off.   Varney Daily, CMA

## 2018-11-04 NOTE — Addendum Note (Signed)
Addended by: Virl Cagey on: 11/04/2018 03:49 PM   Modules accepted: Orders

## 2018-11-23 ENCOUNTER — Encounter: Payer: Self-pay | Admitting: Psychiatry

## 2018-11-23 ENCOUNTER — Other Ambulatory Visit: Payer: Self-pay

## 2018-11-23 ENCOUNTER — Ambulatory Visit (INDEPENDENT_AMBULATORY_CARE_PROVIDER_SITE_OTHER): Payer: Medicare Other | Admitting: Psychiatry

## 2018-11-23 DIAGNOSIS — F331 Major depressive disorder, recurrent, moderate: Secondary | ICD-10-CM | POA: Diagnosis not present

## 2018-11-23 MED ORDER — ARIPIPRAZOLE 10 MG PO TABS: 10.0000 mg | ORAL_TABLET | Freq: Every day | ORAL | 3 refills | Status: DC

## 2018-11-23 MED ORDER — VENLAFAXINE HCL ER 150 MG PO CP24: 150.0000 mg | ORAL_CAPSULE | Freq: Every day | ORAL | 1 refills | Status: DC

## 2018-11-23 MED ORDER — ALPRAZOLAM 0.25 MG PO TABS: 0.2500 mg | ORAL_TABLET | Freq: Every evening | ORAL | 2 refills | Status: DC | PRN

## 2018-11-23 NOTE — Progress Notes (Signed)
Patient ID: Faith Santiago, female   DOB: Mar 08, 1941, 78 y.o.   MRN: [id number]  Patient is a 78 year old female with history of depression who was followed up for medication adjustment. She reported that she is doing well. Patient reported that her son helped her clean the attic over the weekend. Patient also mentioned that her sister was visiting her last week. She stated that she has been staying at home most of the time and trying to stay safe.Patient currently denied having any suicidal or homicidal ideation the plans. She reported that she has been compliant with her medications and does not have any adverse effects.  Her husband remained supportive. She denied using any drugs or alcohol at this time.  Plan Continue medications as prescribed. Patient will follow-up in three months earlier depending on her symptoms.    I connected with patient via telemedicine application and verified that I am speaking with the correct person using two identifiers.  I discussed the limitations of evaluation and management by telemedicine and the availability of in person appointments. The patient expressed understanding and agreed to proceed.   I discussed the assessment and treatment plan with the patient. The patient was provided an opportunity to ask questions and all were answered. The patient agreed with the plan and demonstrated an understanding of the instructions.   The patient was advised to call back or seek an in-person evaluation if the symptoms worsen or if the condition fails to improve as anticipated.   I provided 15 minutes of non-face-to-face time during this encounter.

## 2019-01-07 ENCOUNTER — Other Ambulatory Visit: Payer: Self-pay | Admitting: Family Medicine

## 2019-02-07 ENCOUNTER — Other Ambulatory Visit: Payer: Self-pay | Admitting: Internal Medicine

## 2019-02-23 ENCOUNTER — Ambulatory Visit (INDEPENDENT_AMBULATORY_CARE_PROVIDER_SITE_OTHER): Payer: Medicare Other | Admitting: Psychiatry

## 2019-02-23 ENCOUNTER — Encounter: Payer: Self-pay | Admitting: Psychiatry

## 2019-02-23 ENCOUNTER — Other Ambulatory Visit: Payer: Self-pay

## 2019-02-23 DIAGNOSIS — F3341 Major depressive disorder, recurrent, in partial remission: Secondary | ICD-10-CM

## 2019-02-23 DIAGNOSIS — F419 Anxiety disorder, unspecified: Secondary | ICD-10-CM

## 2019-02-23 MED ORDER — ALPRAZOLAM 0.25 MG PO TABS: 0.2500 mg | ORAL_TABLET | Freq: Every evening | ORAL | 2 refills | Status: DC | PRN

## 2019-02-23 MED ORDER — VENLAFAXINE HCL ER 75 MG PO CP24: ORAL_CAPSULE | ORAL | 0 refills | Status: DC

## 2019-02-23 MED ORDER — ARIPIPRAZOLE 10 MG PO TABS: 10.0000 mg | ORAL_TABLET | Freq: Every day | ORAL | 0 refills | Status: DC

## 2019-02-23 MED ORDER — VENLAFAXINE HCL ER 150 MG PO CP24: 150.0000 mg | ORAL_CAPSULE | Freq: Every day | ORAL | 0 refills | Status: DC

## 2019-02-23 NOTE — Progress Notes (Signed)
[facility] MD OP Progress Note  I connected with  Faith Santiago on 02/23/19 by phone and verified that I am speaking with the correct person using two identifiers.   I discussed the limitations of evaluation and management by phone. The patient expressed understanding and agreed to proceed.    02/23/2019 3:48 PM Faith Santiago  MRN:  [id number]  Chief Complaint:  " I feel sad."  HPI: Pt reported that she has been feeling sort of depressed. She stated that with the isolation due to COVID and then the stressful election season have been took much. She stated that she can be happier than her current state. She was offered increase in the dose of Effexor for optimal effect. She denied any suicidal ideations or plans.  Visit Diagnosis:    ICD-10-CM   1. MDD (major depressive disorder), recurrent, in partial remission ([place])  F33.41   2. Anxiety  F41.9     Past Psychiatric History: Depression, anxiety  Past Medical History:  Past Medical History:  Diagnosis Date  . Anemia, unspecified   . Anxiety   . Arthritis    hands R>L, neck and lower back  . Bimalleolar fracture of left ankle 11/19/2013   [place]  . Cancer ([place])   . Cataract   . Depression   . Esophageal reflux   . GERD with stricture 06/03/2007  . Glaucoma   . History of osteopenia    DEXA WNL 2008, 2015  . History of uterine cancer 1999   s/p hysterectomy  . HLD (hyperlipidemia)   . Narcolepsy   . Thyroid disease     Past Surgical History:  Procedure Laterality Date  . bilateral catarct removal  2010  . COLONOSCOPY  05/2003   WNL (Dr Velora Heckler)  . COLONOSCOPY  11/2017   3 small TA, diverticulisis, no f/u needed Carlean Purl)  . DEXA  03/2014   WNL T score +3.9  . ESOPHAGOGASTRODUODENOSCOPY  11/2017   dilated esoph stenosis, 8cm HH ([facility])  . ESOPHAGOGASTRODUODENOSCOPY  12/2017   repeat dilation of esoph stenosis, 5cm HH - rec stay on PPI Carlean Purl)  . ORIF ANKLE FRACTURE Left 11/19/2013   Procedure: LEFT ANKLE FRACTURE OPEN  TREATMENT BIMALLEOLAR ANKLE INCLUDES INTERNAL FIXATION ;  Surgeon: Johnny Bridge, MD  . RETINAL DETACHMENT REPAIR W/ SCLERAL BUCKLE LE Right 03/21/2016  . TOTAL ABDOMINAL HYSTERECTOMY  1999   uterine cancer    Family Psychiatric History: depression- mom  Family History:  Family History  Problem Relation Age of Onset  . Hypothyroidism Mother   . Stroke Mother        ministroke  . Coronary artery disease Mother 3       s/p some heart surgery  . Prostate cancer Father   . Diabetes Neg Hx   . Colon cancer Neg Hx   . Esophageal cancer Neg Hx   . Pancreatic cancer Neg Hx   . Stomach cancer Neg Hx   . Liver disease Neg Hx   . Rectal cancer Neg Hx     Social History:  Social History   Socioeconomic History  . Marital status: Married    Spouse name: Not on file  . Number of children: Not on file  . Years of education: Not on file  . Highest education level: Not on file  Occupational History  . Occupation: retired-occupational therapist  Social Needs  . Financial resource strain: Not on file  . Food insecurity    Worry: Not on file  Inability: Not on file  . Transportation needs    Medical: Not on file    Non-medical: Not on file  Tobacco Use  . Smoking status: Never Smoker  . Smokeless tobacco: Never Used  Substance and Sexual Activity  . Alcohol use: Yes    Alcohol/week: 7.0 - 12.0 standard drinks    Types: 7 - 10 Glasses of wine per week    Comment: 1 drink /day  . Drug use: No  . Sexual activity: Never  Lifestyle  . Physical activity    Days per week: Not on file    Minutes per session: Not on file  . Stress: Not on file  Relationships  . Social Herbalist on phone: Not on file    Gets together: Not on file    Attends religious service: Not on file    Active member of club or organization: Not on file    Attends meetings of clubs or organizations: Not on file    Relationship status: Not on file  Other Topics Concern  . Not on file  Social  History Narrative   caffeine: none   Lives with husband, has 2 grown children, 1 cat   Occupation: retired, was OT for [employer]   Activity: no regular exercise, to start silver sneakers   Diet: overeating, good amt water, vegetables daily, occasional fruits   Never smoker   EtOH 1/day    Allergies:  Allergies  Allergen Reactions  . Ciprofloxacin     REACTION: sun induced rash    Metabolic Disorder Labs: Lab Results  Component Value Date   HGBA1C 5.9 10/29/2018   No results found for: PROLACTIN Lab Results  Component Value Date   CHOL 288 (H) 11/02/2018   TRIG 316.0 (H) 11/02/2018   HDL 68.50 11/02/2018   CHOLHDL 4 11/02/2018   VLDL 63.2 (H) 11/02/2018   LDLCALC 146 (H) 08/06/2016   LDLCALC 157 (H) 03/17/2014   Lab Results  Component Value Date   TSH 1.73 10/29/2018   TSH 1.52 10/15/2017    Therapeutic Level Labs: No results found for: LITHIUM No results found for: VALPROATE No components found for:  CBMZ  Current Medications: Current Outpatient Medications  Medication Sig Dispense Refill  . ALPRAZolam (XANAX) 0.25 MG tablet Take 1 tablet (0.25 mg total) by mouth at bedtime as needed for anxiety. Not to be filled before 12/13/2018 30 tablet 2  . ARIPiprazole (ABILIFY) 10 MG tablet Take 1 tablet (10 mg total) by mouth daily. 90 tablet 3  . atorvastatin (LIPITOR) 20 MG tablet Take 1 tablet (20 mg total) by mouth daily. 30 tablet 6  . Cholecalciferol (VITAMIN D) 50 MCG (2000 UT) CAPS Take 1 capsule (2,000 Units total) by mouth daily. 30 capsule   . levothyroxine (SYNTHROID) 50 MCG tablet TAKE 1 TABLET (50 MCG TOTAL) BY MOUTH DAILY. 90 tablet 3  . omeprazole (PRILOSEC) 40 MG capsule TAKE 1 CAPSULE BY MOUTH  DAILY BEFORE BREAKFAST 90 capsule 3  . venlafaxine XR (EFFEXOR-XR) 150 MG 24 hr capsule Take 1 capsule (150 mg total) by mouth daily with breakfast. 90 capsule 1   No current facility-administered medications for this visit.       Psychiatric Specialty Exam: ROS   There were no vitals taken for this visit.There is no height or weight on file to calculate BMI.  General Appearance:  unable to assess due to phone visit  Eye Contact:   unable to assess due to phone visit  Speech:  Clear and Coherent and Normal Rate  Volume:  Normal  Mood:  Depressed  Affect:  Congruent  Thought Process:  Goal Directed, Linear and Descriptions of Associations: Intact  Orientation:  Full (Time, Place, and Person)  Thought Content: Logical   Suicidal Thoughts:  No  Homicidal Thoughts:  No  Memory:  Recent;   Fair Remote;   Good  Judgement:  Fair  Insight:  Fair  Psychomotor Activity:  Normal  Concentration:  Concentration: Fair and Attention Span: Fair  Recall:  Good  Fund of Knowledge: Good  Language: Fair  Akathisia:  Negative  Handed:  Right  AIMS (if indicated): not done  Assets:  Communication Skills Desire for Improvement Financial Resources/Insurance Housing Social Support  ADL's:  Intact  Cognition: WNL  Sleep:  Fair   Screenings: GAD-7     Office Visit from 06/01/2015 in [place] at [place] from 10/15/2017 in [place] at [place] from 04/02/2016 in [place] at [facility]  Total Score (max 30 points )  20  20    PHQ2-9     Office Visit from 11/02/2018 in [place] at [place] from 10/15/2017 in [place] at [place] from 08/13/2016 in [place] at [place] from 04/02/2016 in [place] at [place] from 06/01/2015 in [place] at [facility] Total Score  6  0  6  0  6  PHQ-9 Total Score  16  0  13  -  25       Assessment and Plan: 78 y/o female with hx of depression and anxiety reported feeling depressed lately. She is agreeable to increasing the dose of Effexor for optimal effect.  1. MDD (major depressive  disorder), recurrent, in partial remission ([facility])  - Increase Effexor to 225 mg daily - Continue Abilify 10 mg daily   2. Anxiety - Continue Xanax 0.25 mg PRN for anxiety  F/up in 6 weeks.   [place] Crane, MD 02/23/2019, 3:48 PM

## 2019-03-01 ENCOUNTER — Ambulatory Visit: Payer: Medicare Other | Admitting: Psychiatry

## 2019-04-02 ENCOUNTER — Ambulatory Visit (INDEPENDENT_AMBULATORY_CARE_PROVIDER_SITE_OTHER): Payer: Medicare Other | Admitting: Psychiatry

## 2019-04-02 ENCOUNTER — Encounter: Payer: Self-pay | Admitting: Psychiatry

## 2019-04-02 ENCOUNTER — Other Ambulatory Visit: Payer: Self-pay

## 2019-04-02 DIAGNOSIS — F3342 Major depressive disorder, recurrent, in full remission: Secondary | ICD-10-CM | POA: Diagnosis not present

## 2019-04-02 DIAGNOSIS — F419 Anxiety disorder, unspecified: Secondary | ICD-10-CM | POA: Diagnosis not present

## 2019-04-02 MED ORDER — ALPRAZOLAM 0.25 MG PO TABS: 0.2500 mg | ORAL_TABLET | Freq: Every evening | ORAL | 1 refills | Status: DC | PRN

## 2019-04-02 MED ORDER — VENLAFAXINE HCL ER 75 MG PO CP24: ORAL_CAPSULE | ORAL | 0 refills | Status: DC

## 2019-04-02 MED ORDER — ARIPIPRAZOLE 10 MG PO TABS: 10.0000 mg | ORAL_TABLET | Freq: Every day | ORAL | 0 refills | Status: DC

## 2019-04-02 NOTE — Progress Notes (Signed)
[facility] MD OP Progress Note  I connected with  Faith Santiago on 04/02/19 by phone and verified that I am speaking with the correct person using two identifiers.   I discussed the limitations of evaluation and management by phone. The patient expressed understanding and agreed to proceed.    04/02/2019 11:27 AM Faith Santiago  MRN:  [id number]  Chief Complaint:  " I feel better."  HPI: Pt reported that she feels better after the medicine dose was adjusted.  She feels she can get more work done now.  She has been sleeping fine.  She had a small family get together for Thanksgiving.  She is looking forward to Christmas to spend time with her family.  She denied any issues or concerns at this time.  Visit Diagnosis:    ICD-10-CM   1. MDD (major depressive disorder), recurrent, in full remission ([place])  F33.42   2. Anxiety  F41.9     Past Psychiatric History: Depression, anxiety  Past Medical History:  Past Medical History:  Diagnosis Date  . Anemia, unspecified   . Anxiety   . Arthritis    hands R>L, neck and lower back  . Bimalleolar fracture of left ankle 11/19/2013   [place]  . Cancer ([place])   . Cataract   . Depression   . Esophageal reflux   . GERD with stricture 06/03/2007  . Glaucoma   . History of osteopenia    DEXA WNL 2008, 2015  . History of uterine cancer 1999   s/p hysterectomy  . HLD (hyperlipidemia)   . Narcolepsy   . Thyroid disease     Past Surgical History:  Procedure Laterality Date  . bilateral catarct removal  2010  . COLONOSCOPY  05/2003   WNL (Dr Velora Heckler)  . COLONOSCOPY  11/2017   3 small TA, diverticulisis, no f/u needed Carlean Purl)  . DEXA  03/2014   WNL T score +3.9  . ESOPHAGOGASTRODUODENOSCOPY  11/2017   dilated esoph stenosis, 8cm HH ([facility])  . ESOPHAGOGASTRODUODENOSCOPY  12/2017   repeat dilation of esoph stenosis, 5cm HH - rec stay on PPI Carlean Purl)  . ORIF ANKLE FRACTURE Left 11/19/2013   Procedure: LEFT ANKLE FRACTURE OPEN TREATMENT  BIMALLEOLAR ANKLE INCLUDES INTERNAL FIXATION ;  Surgeon: Johnny Bridge, MD  . RETINAL DETACHMENT REPAIR W/ SCLERAL BUCKLE LE Right 03/21/2016  . TOTAL ABDOMINAL HYSTERECTOMY  1999   uterine cancer    Family Psychiatric History: depression- mom  Family History:  Family History  Problem Relation Age of Onset  . Hypothyroidism Mother   . Stroke Mother        ministroke  . Coronary artery disease Mother 67       s/p some heart surgery  . Prostate cancer Father   . Diabetes Neg Hx   . Colon cancer Neg Hx   . Esophageal cancer Neg Hx   . Pancreatic cancer Neg Hx   . Stomach cancer Neg Hx   . Liver disease Neg Hx   . Rectal cancer Neg Hx     Social History:  Social History   Socioeconomic History  . Marital status: Married    Spouse name: Not on file  . Number of children: Not on file  . Years of education: Not on file  . Highest education level: Not on file  Occupational History  . Occupation: retired-occupational therapist  Tobacco Use  . Smoking status: Never Smoker  . Smokeless tobacco: Never Used  Substance and Sexual Activity  .  Alcohol use: Yes    Alcohol/week: 7.0 - 12.0 standard drinks    Types: 7 - 10 Glasses of wine per week    Comment: 1 drink /day  . Drug use: No  . Sexual activity: Never  Other Topics Concern  . 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 Difficulty of Paying Living Expenses: Not on file  Food Insecurity:   . Worried About Charity fundraiser in the Last Year: Not on file  . Ran Out of Food in the Last Year: Not on file  Transportation Needs:   . Lack of Transportation (Medical): Not on file  . Lack of  Transportation (Non-Medical): Not on file  Physical Activity:   . Days of Exercise per Week: Not on file  . Minutes of Exercise per Session: Not on file  Stress:   . Feeling of Stress : Not on file  Social Connections:   . Frequency of Communication with Friends and Family: Not on file  . Frequency of Social Gatherings with Friends and Family: Not on file  . Attends Religious Services: Not on file  . Active Member of Clubs or Organizations: Not on file  . Attends Archivist Meetings: Not on file  . Marital Status: Not on file    Allergies:  Allergies  Allergen Reactions  . Ciprofloxacin     REACTION: sun induced rash    Metabolic Disorder Labs: Lab Results  Component Value Date   HGBA1C 5.9 10/29/2018   No results found for: PROLACTIN Lab Results  Component Value Date   CHOL 288 (H) 11/02/2018   TRIG 316.0 (H) 11/02/2018   HDL 68.50 11/02/2018   CHOLHDL 4 11/02/2018   VLDL 63.2 (H) 11/02/2018   LDLCALC 146 (H) 08/06/2016   LDLCALC 157 (H) 03/17/2014   Lab Results  Component Value Date   TSH 1.73 10/29/2018   TSH 1.52 10/15/2017    Therapeutic Level Labs: No results found for: LITHIUM No results found for: VALPROATE No components found for:  CBMZ  Current Medications: Current Outpatient Medications  Medication Sig Dispense Refill  . ALPRAZolam (XANAX) 0.25 MG tablet Take 1 tablet (0.25 mg total) by mouth at bedtime as needed for anxiety. 30 tablet 2  . ARIPiprazole (ABILIFY) 10 MG tablet Take 1 tablet (10 mg total) by mouth daily. 90 tablet 0  . atorvastatin (LIPITOR) 20 MG tablet Take 1 tablet (20 mg total) by mouth daily. 30 tablet 6  . Cholecalciferol (VITAMIN D) 50 MCG (2000 UT) CAPS Take 1 capsule (2,000 Units total) by mouth daily. 30 capsule   . levothyroxine (SYNTHROID) 50 MCG tablet TAKE 1 TABLET (50 MCG TOTAL) BY MOUTH DAILY. 90 tablet 3  . omeprazole (PRILOSEC) 40 MG capsule TAKE 1 CAPSULE BY MOUTH  DAILY BEFORE BREAKFAST 90 capsule 3  .  venlafaxine XR (EFFEXOR XR) 75 MG 24 hr capsule Take with Venlafaxine 150 mg in the morning 90 capsule 0  . venlafaxine XR (EFFEXOR-XR) 150 MG 24 hr capsule Take 1 capsule (150 mg total) by mouth daily with breakfast. 90 capsule 0   No current facility-administered medications  for this visit.      Psychiatric Specialty Exam: ROS  There were no vitals taken for this visit.There is no height or weight on file to calculate BMI.  General Appearance:  unable to assess due to phone visit  Eye Contact:   unable to assess due to phone visit  Speech:  Clear and Coherent and Normal Rate  Volume:  Normal  Mood:  Euthymic  Affect:  Congruent  Thought Process:  Goal Directed, Linear and Descriptions of Associations: Intact  Orientation:  Full (Time, Place, and Person)  Thought Content: Logical   Suicidal Thoughts:  No  Homicidal Thoughts:  No  Memory:  Recent;   Fair Remote;   Good  Judgement:  Fair  Insight:  Fair  Psychomotor Activity:  Normal  Concentration:  Concentration: Fair and Attention Span: Fair  Recall:  Good  Fund of Knowledge: Good  Language: Fair  Akathisia:  Negative  Handed:  Right  AIMS (if indicated): not done  Assets:  Communication Skills Desire for Improvement Financial Resources/Insurance Housing Social Support  ADL's:  Intact  Cognition: WNL  Sleep:  Good   Screenings: GAD-7     Office Visit from 06/01/2015 in [place] at [facility]  Total GAD-7 Score  [place] from 10/15/2017 in [place] at [place] from 04/02/2016 in [place] at [facility]  Total Score (max 30 points )  20  20    PHQ2-9     Office Visit from 11/02/2018 in [place] at [place] from 10/15/2017 in [place] at [place] from 08/13/2016 in [place] at [place] from 04/02/2016 in [place] at [place]  from 06/01/2015 in [place] at [facility] Total Score  6  0  6  0  6  PHQ-9 Total Score  16  0  13  --  25       Assessment and Plan: Patient reported doing better on her current regimen.  1. MDD (major depressive disorder), recurrent, in full remission ([place])  - ARIPiprazole (ABILIFY) 10 MG tablet; Take 1 tablet (10 mg total) by mouth daily.  Dispense: 90 tablet; Refill: 0 - venlafaxine XR (EFFEXOR XR) 75 MG 24 hr capsule; Take with Venlafaxine 150 mg in the morning  Dispense: 270 capsule; Refill: 0  2. Anxiety  - ALPRAZolam (XANAX) 0.25 MG tablet; Take 1 tablet (0.25 mg total) by mouth at bedtime as needed for anxiety.  Dispense: 30 tablet; Refill: 1  Continue same regimen. F/up in 2 months.   [place] Crane, MD 04/02/2019, 11:27 AM

## 2019-04-05 ENCOUNTER — Telehealth: Payer: Self-pay

## 2019-04-05 DIAGNOSIS — F3342 Major depressive disorder, recurrent, in full remission: Secondary | ICD-10-CM

## 2019-04-05 MED ORDER — VENLAFAXINE HCL ER 75 MG PO CP24: 75.0000 mg | ORAL_CAPSULE | Freq: Every day | ORAL | 0 refills | Status: DC

## 2019-04-05 NOTE — Telephone Encounter (Signed)
Received a fax requesting better instruction then just the mg taken.   Please send as (take 1 a day or 2 a day....)    venlafaxine XR (EFFEXOR XR) 75 MG 24 hr capsule Medication Date: 04/02/2019 Department: [place] Ordering/Authorizing: [place] Crane, MD  Order Providers  Prescribing Provider Encounter Provider  [place] Crane, MD [place] Crane, MD  Outpatient Medication Detail   Disp Refills Start End   venlafaxine XR (EFFEXOR XR) 75 MG 24 hr capsule 270 capsule 0 04/02/2019    Sig: Take with Venlafaxine 150 mg in the morning   Sent to pharmacy as: venlafaxine XR (EFFEXOR XR) 75 MG 24 hr capsule   E-Prescribing Status: Receipt confirmed by pharmacy (04/02/2019 11:45 AM EST)

## 2019-04-05 NOTE — Telephone Encounter (Signed)
Sent Script with clarification.

## 2019-05-09 ENCOUNTER — Other Ambulatory Visit: Payer: Self-pay | Admitting: Family Medicine

## 2019-05-17 DIAGNOSIS — U071 COVID-19: Secondary | ICD-10-CM

## 2019-05-17 HISTORY — DX: COVID-19: U07.1

## 2019-05-31 ENCOUNTER — Ambulatory Visit: Payer: Medicare PPO | Attending: Internal Medicine

## 2019-05-31 DIAGNOSIS — Z20822 Contact with and (suspected) exposure to covid-19: Secondary | ICD-10-CM | POA: Diagnosis not present

## 2019-06-01 LAB — NOVEL CORONAVIRUS, NAA: SARS-CoV-2, NAA: DETECTED — AB

## 2019-06-02 ENCOUNTER — Telehealth: Payer: Self-pay | Admitting: Physician Assistant

## 2019-06-02 NOTE — Telephone Encounter (Signed)
Pt reports symptoms started over 2 wks ago. She has a dry cough, HA and muscle ache. Pt denies any other symptoms but is not checking her temperature. She reports she is pretty sure she does not have a fever.  Advised pt if any symptoms worsen or she has any new symptoms to contact the office. Pt verbalized understanding.  Pt also wanted to know if she could get the vaccine if she was called from the waitlist. Advised pt she is to quarantine for at least 14 days. Once isolation was complete if she was feeling fully recovered she could get the vaccine.

## 2019-06-02 NOTE — Telephone Encounter (Addendum)
Would also suggest to wait 90 days after covid illness to get vaccine (if hasn't received first dose. if she has received first dose, then would have her go get second dose after finishing 14d quarantine).

## 2019-06-02 NOTE — Telephone Encounter (Signed)
Thanks for letting me know. I never received positive test result.  plz call pt later today for update on symptoms, when symptoms started. If >2 wks since symptom onset and feeling better, no need to follow.

## 2019-06-02 NOTE — Telephone Encounter (Signed)
Advised pt. Pt verbalized understanding.

## 2019-06-02 NOTE — Telephone Encounter (Signed)
Called to discuss with [name] about Covid symptoms and the use of bamlanivimab or casirivimab/imdevimab, a monoclonal antibody infusion for those with mild to moderate Covid symptoms and at a high risk of hospitalization.     Pt does not qualify for infusion therapy as pt's symptoms first presented > 10 days prior to timing of infusion. Symptoms tier reviewed as well as criteria for ending isolation. Preventative practices reviewed. Patient verbalized understanding   Patient Active Problem List   Diagnosis Date Noted  . MDD (major depressive disorder), recurrent, in partial remission ([place]) 02/23/2019  . Anxiety 02/23/2019  . Vaginal discharge 11/02/2018  . Urinary incontinence 03/15/2018  . Falls 03/15/2018  . History of uterine cancer 01/22/2018  . Prediabetes 10/14/2017  . Hypothyroidism 04/07/2015  . Vitamin D deficiency 04/02/2015  . Macrocytosis 03/28/2015  . Other fatigue 11/25/2014  . Advanced care planning/counseling discussion 03/15/2014  . Health maintenance examination 03/15/2014  . Postsurgical menopause 08/07/2011  . Medicare annual wellness visit, subsequent 08/06/2011  . Osteoarthritis 06/07/2009  . Narcolepsy without cataplexy 05/31/2008  . HLD (hyperlipidemia) 06/03/2007  . MDD (major depressive disorder), recurrent episode, severe ([place]) 06/03/2007  . GERD with stricture 06/03/2007    Angelena Form PA-C

## 2019-06-07 ENCOUNTER — Other Ambulatory Visit: Payer: Self-pay

## 2019-06-07 ENCOUNTER — Encounter: Payer: Self-pay | Admitting: Psychiatry

## 2019-06-07 ENCOUNTER — Ambulatory Visit (INDEPENDENT_AMBULATORY_CARE_PROVIDER_SITE_OTHER): Payer: Medicare PPO | Admitting: Psychiatry

## 2019-06-07 DIAGNOSIS — F3342 Major depressive disorder, recurrent, in full remission: Secondary | ICD-10-CM | POA: Insufficient documentation

## 2019-06-07 DIAGNOSIS — F419 Anxiety disorder, unspecified: Secondary | ICD-10-CM

## 2019-06-07 MED ORDER — VENLAFAXINE HCL ER 75 MG PO CP24: ORAL_CAPSULE | ORAL | 0 refills | Status: DC

## 2019-06-07 MED ORDER — VENLAFAXINE HCL ER 150 MG PO CP24: ORAL_CAPSULE | ORAL | 0 refills | Status: DC

## 2019-06-07 MED ORDER — ARIPIPRAZOLE 10 MG PO TABS: 10.0000 mg | ORAL_TABLET | Freq: Every day | ORAL | 0 refills | Status: DC

## 2019-06-07 MED ORDER — ALPRAZOLAM 0.25 MG PO TABS: 0.2500 mg | ORAL_TABLET | Freq: Every evening | ORAL | 1 refills | Status: DC | PRN

## 2019-06-07 NOTE — Progress Notes (Signed)
[place] MD OP Progress Note  I connected with  Faith Santiago on 06/07/19 by phone and verified that I am speaking with the correct person using two identifiers.   I discussed the limitations of evaluation and management by phone. The patient expressed understanding and agreed to proceed.    06/07/2019 11:08 AM Faith Santiago  MRN:  [id number]  Chief Complaint:  " I have been down due to [place]."  HPI: Pt and her family numbers recently contracted COVID-16 infection and has been dealing with that for the past few weeks.  Patient reported that she has completed 1 week of quarantine and has 1 more week to go.  Physically she is feeling better today however the past few days have been rough.  She is hoping that she and her family will continue to do better so that they can put it behind them. She stated that due to the Covid infection she has been feeling depressed with low energy levels however she understands that this should improve once the infection improves. She denies any major concerns about her mood and agreed to continue the same regimen for now.  Visit Diagnosis:    ICD-10-CM   1. MDD (major depressive disorder), recurrent, in full remission ([place])  F33.42     Past Psychiatric History: Depression, anxiety  Past Medical History:  Past Medical History:  Diagnosis Date  . Anemia, unspecified   . Anxiety   . Arthritis    hands R>L, neck and lower back  . Bimalleolar fracture of left ankle 11/19/2013   [place]  . Cancer ([place])   . Cataract   . Depression   . Esophageal reflux   . GERD with stricture 06/03/2007  . Glaucoma   . History of osteopenia    DEXA WNL 2008, 2015  . History of uterine cancer 1999   s/p hysterectomy  . HLD (hyperlipidemia)   . Narcolepsy   . Thyroid disease     Past Surgical History:  Procedure Laterality Date  . bilateral catarct removal  2010  . COLONOSCOPY  05/2003   WNL (Dr Velora Heckler)  . COLONOSCOPY  11/2017   3 small TA, diverticulisis, no f/u  needed Carlean Purl)  . DEXA  03/2014   WNL T score +3.9  . ESOPHAGOGASTRODUODENOSCOPY  11/2017   dilated esoph stenosis, 8cm HH (Gessner)  . ESOPHAGOGASTRODUODENOSCOPY  12/2017   repeat dilation of esoph stenosis, 5cm HH - rec stay on PPI Carlean Purl)  . ORIF ANKLE FRACTURE Left 11/19/2013   Procedure: LEFT ANKLE FRACTURE OPEN TREATMENT BIMALLEOLAR ANKLE INCLUDES INTERNAL FIXATION ;  Surgeon: Johnny Bridge, MD  . RETINAL DETACHMENT REPAIR W/ SCLERAL BUCKLE LE Right 03/21/2016  . TOTAL ABDOMINAL HYSTERECTOMY  1999   uterine cancer    Family Psychiatric History: depression- mom  Family History:  Family History  Problem Relation Age of Onset  . Hypothyroidism Mother   . Stroke Mother        ministroke  . Coronary artery disease Mother 53       s/p some heart surgery  . Prostate cancer Father   . Diabetes Neg Hx   . Colon cancer Neg Hx   . Esophageal cancer Neg Hx   . Pancreatic cancer Neg Hx   . Stomach cancer Neg Hx   . Liver disease Neg Hx   . Rectal cancer Neg Hx     Social History:  Social History   Socioeconomic History  . Marital status: Married    Spouse  name: Not on file  . Number of children: Not on file  . Years of education: Not on file  . Highest education level: Not on file  Occupational History  . Occupation: retired-occupational therapist  Tobacco Use  . Smoking status: Never Smoker  . Smokeless tobacco: Never Used  Substance and Sexual Activity  . Alcohol use: Yes    Alcohol/week: 7.0 - 12.0 standard drinks    Types: 7 - 10 Glasses of wine per week    Comment: 1 drink /day  . Drug use: No  . Sexual activity: Never  Other Topics Concern  . Not on file  Social History Narrative   caffeine: none   Lives with husband, has 2 grown children, 1 cat   Occupation: retired, was OT for [employer]   Activity: no regular exercise, to start silver sneakers   Diet: overeating, good amt water, vegetables daily, occasional fruits   Never smoker   EtOH 1/day   Social  Determinants of Health   Financial Resource Strain:   . Difficulty of Paying Living Expenses: Not on file  Food Insecurity:   . Worried About Charity fundraiser in the Last Year: Not on file  . Ran Out of Food in the Last Year: Not on file  Transportation Needs:   . Lack of Transportation (Medical): Not on file  . Lack of Transportation (Non-Medical): Not on file  Physical Activity:   . Days of Exercise per Week: Not on file  . Minutes of Exercise per Session: Not on file  Stress:   . Feeling of Stress : Not on file  Social Connections:   . Frequency of Communication with Friends and Family: Not on file  . Frequency of Social Gatherings with Friends and Family: Not on file  . Attends Religious Services: Not on file  . Active Member of Clubs or Organizations: Not on file  . Attends Archivist Meetings: Not on file  . Marital Status: Not on file    Allergies:  Allergies  Allergen Reactions  . Ciprofloxacin     REACTION: sun induced rash    Metabolic Disorder Labs: Lab Results  Component Value Date   HGBA1C 5.9 10/29/2018   No results found for: PROLACTIN Lab Results  Component Value Date   CHOL 288 (H) 11/02/2018   TRIG 316.0 (H) 11/02/2018   HDL 68.50 11/02/2018   CHOLHDL 4 11/02/2018   VLDL 63.2 (H) 11/02/2018   LDLCALC 146 (H) 08/06/2016   LDLCALC 157 (H) 03/17/2014   Lab Results  Component Value Date   TSH 1.73 10/29/2018   TSH 1.52 10/15/2017    Therapeutic Level Labs: No results found for: LITHIUM No results found for: VALPROATE No components found for:  CBMZ  Current Medications: Current Outpatient Medications  Medication Sig Dispense Refill  . ALPRAZolam (XANAX) 0.25 MG tablet Take 1 tablet (0.25 mg total) by mouth at bedtime as needed for anxiety. 30 tablet 1  . ARIPiprazole (ABILIFY) 10 MG tablet Take 1 tablet (10 mg total) by mouth daily. 90 tablet 0  . atorvastatin (LIPITOR) 20 MG tablet TAKE 1 TABLET BY MOUTH EVERY DAY 90 tablet 2   . Cholecalciferol (VITAMIN D) 50 MCG (2000 UT) CAPS Take 1 capsule (2,000 Units total) by mouth daily. 30 capsule   . levothyroxine (SYNTHROID) 50 MCG tablet TAKE 1 TABLET (50 MCG TOTAL) BY MOUTH DAILY. 90 tablet 3  . omeprazole (PRILOSEC) 40 MG capsule TAKE 1 CAPSULE BY MOUTH  DAILY BEFORE  BREAKFAST 90 capsule 3  . venlafaxine XR (EFFEXOR XR) 75 MG 24 hr capsule Take 1 capsule (75 mg total) by mouth daily with breakfast. To be combined with Venlafaxine 150 mg in the morning 270 capsule 0   No current facility-administered medications for this visit.      Psychiatric Specialty Exam: ROS  There were no vitals taken for this visit.There is no height or weight on file to calculate BMI.  General Appearance:  unable to assess due to phone visit  Eye Contact:   unable to assess due to phone visit  Speech:  Clear and Coherent and Normal Rate  Volume:  Normal  Mood:  Euthymic  Affect:  Congruent  Thought Process:  Goal Directed, Linear and Descriptions of Associations: Intact  Orientation:  Full (Time, Place, and Person)  Thought Content: Logical   Suicidal Thoughts:  No  Homicidal Thoughts:  No  Memory:  Recent;   Fair Remote;   Good  Judgement:  Fair  Insight:  Fair  Psychomotor Activity:  Normal  Concentration:  Concentration: Fair and Attention Span: Fair  Recall:  Good  Fund of Knowledge: Good  Language: Fair  Akathisia:  Negative  Handed:  Right  AIMS (if indicated): not done  Assets:  Communication Skills Desire for Improvement Financial Resources/Insurance Housing Social Support  ADL's:  Intact  Cognition: WNL  Sleep:  Good   Screenings: GAD-7     Office Visit from 06/01/2015 in [place] at [facility]  Total GAD-7 Score  [place] from 10/15/2017 in [place] at [place] from 04/02/2016 in [place] at [name]  Total Score (max 30 points )  20  20    PHQ2-9     Office Visit from  11/02/2018 in [place] at [place] from 10/15/2017 in [place] at [place] from 08/13/2016 in [place] at [place] from 04/02/2016 in [place] at [place] from 06/01/2015 in [place] at [facility] Total Score  6  0  6  0  6  PHQ-9 Total Score  16  0  13  --  25       Assessment and Plan: Patient is currently fighting COVID-19 infection so has been feeling a little low however overall mood has been stable.  We will continue same regimen for now.  1. MDD (major depressive disorder), recurrent, in full remission ([place])  - ARIPiprazole (ABILIFY) 10 MG tablet; Take 1 tablet (10 mg total) by mouth daily.  Dispense: 90 tablet; Refill: 0 - venlafaxine XR (EFFEXOR XR) 75 MG 24 hr capsule; Take with Venlafaxine 150 mg in the morning  Dispense: 270 capsule; Refill: 0  2. Anxiety  - ALPRAZolam (XANAX) 0.25 MG tablet; Take 1 tablet (0.25 mg total) by mouth at bedtime as needed for anxiety.  Dispense: 30 tablet; Refill: 1  Continue same regimen. F/up in 2 months.   [place] Crane, MD 06/07/2019, 11:08 AM

## 2019-06-08 ENCOUNTER — Telehealth: Payer: Self-pay

## 2019-06-08 NOTE — Telephone Encounter (Addendum)
Mr Lichtenstein [facility] signed) left v/m that pt was diagnosed with covid on 05/31/19; pts husband thought someone from [facility] advised pt to get covid vaccine within 2 wks. Pt's husband wants to know what Dr Darnell Level thinks pt should do in regard to getting covid vaccine and how to schedule covid vaccine.Please advise.

## 2019-06-08 NOTE — Telephone Encounter (Signed)
Spoke with pt's Mr. Dyke relaying Dr. Synthia Innocent message.  Verbalizes understanding.

## 2019-06-08 NOTE — Telephone Encounter (Signed)
No - would recommend she wait 3 months from covid illness prior to receiving vaccine.  They may sign up for waiting list through [email or web address]

## 2019-08-03 ENCOUNTER — Telehealth: Payer: Medicare PPO | Admitting: Psychiatry

## 2019-08-11 ENCOUNTER — Telehealth (INDEPENDENT_AMBULATORY_CARE_PROVIDER_SITE_OTHER): Payer: Medicare PPO | Admitting: Psychiatry

## 2019-08-11 ENCOUNTER — Encounter: Payer: Self-pay | Admitting: Psychiatry

## 2019-08-11 ENCOUNTER — Other Ambulatory Visit: Payer: Self-pay

## 2019-08-11 DIAGNOSIS — F3342 Major depressive disorder, recurrent, in full remission: Secondary | ICD-10-CM | POA: Diagnosis not present

## 2019-08-11 DIAGNOSIS — F419 Anxiety disorder, unspecified: Secondary | ICD-10-CM

## 2019-08-11 MED ORDER — ALPRAZOLAM 0.25 MG PO TABS: 0.2500 mg | ORAL_TABLET | Freq: Every evening | ORAL | 1 refills | Status: DC | PRN

## 2019-08-11 MED ORDER — VENLAFAXINE HCL ER 75 MG PO CP24: ORAL_CAPSULE | ORAL | 0 refills | Status: DC

## 2019-08-11 MED ORDER — ARIPIPRAZOLE 10 MG PO TABS: 10.0000 mg | ORAL_TABLET | Freq: Every day | ORAL | 0 refills | Status: DC

## 2019-08-11 MED ORDER — VENLAFAXINE HCL ER 150 MG PO CP24: ORAL_CAPSULE | ORAL | 0 refills | Status: DC

## 2019-08-11 NOTE — Progress Notes (Signed)
[place] MD OP Progress Note  I connected with  Faith Santiago on 08/11/19 by phone and verified that I am speaking with the correct person using two identifiers.   I discussed the limitations of evaluation and management by phone. The patient expressed understanding and agreed to proceed.    08/11/2019 11:29 AM Faith Santiago  MRN:  [id number]  Chief Complaint:  " I am doing okay."  HPI: Pt informed that overall things are going well.  She stated that she recovered from the COVID-19 infection.  She stated that her daughter is getting married in 2 days and she is happy for her.  She stated that overall things are progressing well however she has noticed excessive fatigue.  She stated that she does not feel like doing much and prefers to spend her time watching TV all the time.  She is not sure if this could be due to "recovery from recent COVID-19 infection.  She did endorse significant fatigue when she was actively infected with COVID-19 virus.  Patient was advised to get ample rest and discuss this with her PCP.  Visit Diagnosis:    ICD-10-CM   1. MDD (major depressive disorder), recurrent, in full remission ([facility])  F33.42 venlafaxine XR (EFFEXOR XR) 75 MG 24 hr capsule    venlafaxine XR (EFFEXOR-XR) 150 MG 24 hr capsule    ARIPiprazole (ABILIFY) 10 MG tablet  2. Anxiety  F41.9 ALPRAZolam (XANAX) 0.25 MG tablet    Past Psychiatric History: Depression, anxiety  Past Medical History:  Past Medical History:  Diagnosis Date  . Anemia, unspecified   . Anxiety   . Arthritis    hands R>L, neck and lower back  . Bimalleolar fracture of left ankle 11/19/2013   [place]  . Cancer ([place])   . Cataract   . Depression   . Esophageal reflux   . GERD with stricture 06/03/2007  . Glaucoma   . History of osteopenia    DEXA WNL 2008, 2015  . History of uterine cancer 1999   s/p hysterectomy  . HLD (hyperlipidemia)   . Narcolepsy   . Thyroid disease     Past Surgical History:  Procedure Laterality  Date  . bilateral catarct removal  2010  . COLONOSCOPY  05/2003   WNL (Dr Velora Heckler)  . COLONOSCOPY  11/2017   3 small TA, diverticulisis, no f/u needed Carlean Purl)  . DEXA  03/2014   WNL T score +3.9  . ESOPHAGOGASTRODUODENOSCOPY  11/2017   dilated esoph stenosis, 8cm HH ([facility]ner)  . ESOPHAGOGASTRODUODENOSCOPY  12/2017   repeat dilation of esoph stenosis, 5cm HH - rec stay on PPI Carlean Purl)  . ORIF ANKLE FRACTURE Left 11/19/2013   Procedure: LEFT ANKLE FRACTURE OPEN TREATMENT BIMALLEOLAR ANKLE INCLUDES INTERNAL FIXATION ;  Surgeon: Johnny Bridge, MD  . RETINAL DETACHMENT REPAIR W/ SCLERAL BUCKLE LE Right 03/21/2016  . TOTAL ABDOMINAL HYSTERECTOMY  1999   uterine cancer    Family Psychiatric History: depression- mom  Family History:  Family History  Problem Relation Age of Onset  . Hypothyroidism Mother   . Stroke Mother        ministroke  . Coronary artery disease Mother 65       s/p some heart surgery  . Prostate cancer Father   . Diabetes Neg Hx   . Colon cancer Neg Hx   . Esophageal cancer Neg Hx   . Pancreatic cancer Neg Hx   . Stomach cancer Neg Hx   . Liver disease  Neg Hx   . Rectal cancer Neg Hx     Social History:  Social History   Socioeconomic History  . Marital status: Married    Spouse name: Not on file  . Number of children: Not on file  . Years of education: Not on file  . Highest education level: Not on file  Occupational History  . Occupation: retired-occupational therapist  Tobacco Use  . Smoking status: Never Smoker  . Smokeless tobacco: Never Used  Substance and Sexual Activity  . Alcohol use: Yes    Alcohol/week: 7.0 - 12.0 standard drinks    Types: 7 - 10 Glasses of wine per week    Comment: 1 drink /day  . Drug use: No  . Sexual activity: Never  Other Topics Concern  . 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 Difficulty of Paying Living Expenses:   Food Insecurity:   . Worried About Charity fundraiser in the Last Year:   . Arboriculturist in the Last Year:   Transportation Needs:   . Film/video editor (Medical):   Marland Kitchen Lack of Transportation (Non-Medical):   Physical Activity:   . Days of Exercise per Week:   . Minutes of Exercise per Session:   Stress:   . Feeling of Stress :   Social Connections:   . Frequency of Communication with Friends and Family:   . Frequency of Social Gatherings with Friends and Family:   . Attends Religious Services:   . Active Member of Clubs or Organizations:   . Attends Archivist Meetings:   Marland Kitchen Marital Status:     Allergies:  Allergies  Allergen Reactions  . Ciprofloxacin     REACTION: sun induced rash    Metabolic Disorder Labs: Lab Results  Component Value Date   HGBA1C 5.9 10/29/2018   No results found for: PROLACTIN Lab Results  Component Value Date   CHOL 288 (H) 11/02/2018   TRIG 316.0 (H) 11/02/2018   HDL 68.50 11/02/2018   CHOLHDL 4 11/02/2018   VLDL 63.2 (H) 11/02/2018   LDLCALC 146 (H) 08/06/2016   LDLCALC 157 (H) 03/17/2014   Lab Results  Component Value Date   TSH 1.73 10/29/2018   TSH 1.52 10/15/2017    Therapeutic Level Labs: No results found for: LITHIUM No results found for: VALPROATE No components found for:  CBMZ  Current Medications: Current Outpatient Medications  Medication Sig Dispense Refill  . ALPRAZolam (XANAX) 0.25 MG tablet Take 1 tablet (0.25 mg total) by mouth at bedtime as needed for anxiety. 30 tablet 1  . ARIPiprazole (ABILIFY) 10 MG tablet Take 1 tablet (10 mg total) by mouth daily. 90 tablet 0  . atorvastatin (LIPITOR) 20 MG tablet TAKE 1 TABLET BY MOUTH EVERY DAY 90 tablet 2  .  Cholecalciferol (VITAMIN D) 50 MCG (2000 UT) CAPS Take 1 capsule (2,000 Units total) by mouth daily. 30 capsule   . levothyroxine (SYNTHROID) 50 MCG tablet TAKE 1 TABLET (50 MCG TOTAL) BY MOUTH DAILY. 90 tablet 3  . omeprazole (PRILOSEC) 40 MG capsule TAKE 1 CAPSULE BY MOUTH  DAILY BEFORE BREAKFAST 90 capsule 3  . venlafaxine XR (EFFEXOR XR) 75 MG 24 hr capsule Take 1 capsule with 150 mg capsule daily 90 capsule 0  . venlafaxine XR (EFFEXOR-XR) 150 MG 24 hr capsule Take 1 capsule with 75 mg capsule daily (total dose 225 mg daily) 90 capsule 0   No current facility-administered medications for this visit.      Psychiatric Specialty Exam: ROS  There were no vitals taken for this visit.There is no height or weight on file to calculate BMI.  General Appearance:  unable to assess due to phone visit  Eye Contact:   unable to assess due to phone visit  Speech:  Clear and Coherent and Normal Rate  Volume:  Normal  Mood:  Euthymic  Affect:  Congruent  Thought Process:  Goal Directed, Linear and Descriptions of Associations: Intact  Orientation:  Full (Time, Place, and Person)  Thought Content: Logical   Suicidal Thoughts:  No  Homicidal Thoughts:  No  Memory:  Recent;   Fair Remote;   Good  Judgement:  Fair  Insight:  Fair  Psychomotor Activity:  Normal  Concentration:  Concentration: Fair and Attention Span: Fair  Recall:  Good  Fund of Knowledge: Good  Language: Fair  Akathisia:  Negative  Handed:  Right  AIMS (if indicated): not done  Assets:  Communication Skills Desire for Improvement Financial Resources/Insurance Housing Social Support  ADL's:  Intact  Cognition: WNL  Sleep:  Good   Screenings: GAD-7     Office Visit from 06/01/2015 in [place] at [facility]  Total GAD-7 Score  [place] from 10/15/2017 in [place] at [place] from 04/02/2016 in [place] at [facility]  Total Score (max  30 points )  20  20    PHQ2-9     Office Visit from 11/02/2018 in [place] at [place] from 10/15/2017 in [place] at [place] from 08/13/2016 in [place] at [place] from 04/02/2016 in [place] at [place] from 06/01/2015 in [place] at [facility] Total Score  6  0  6  0  6  PHQ-9 Total Score  16  0  13  --  25       Assessment and Plan: Patient complained of excessive fatigue and is not sure if that is due to her recovery from COVID-19 infection.  Patient was advised to get rest and consult with her PCP regarding that.  We will continue same regimen for now.  1. Anxiety  - ALPRAZolam (XANAX) 0.25 MG tablet; Take 1 tablet (0.25 mg total) by mouth at bedtime as needed for anxiety.  Dispense: 30 tablet; Refill: 1  2. MDD (major depressive disorder), recurrent, in full remission ([place])  - venlafaxine XR (EFFEXOR XR) 75 MG 24 hr capsule; Take 1 capsule with 150 mg capsule daily  Dispense: 90 capsule; Refill: 0 - venlafaxine XR (EFFEXOR-XR) 150 MG 24 hr capsule; Take 1 capsule with 75 mg capsule daily (total dose 225 mg daily)  Dispense: 90 capsule; Refill: 0 - ARIPiprazole (ABILIFY) 10 MG tablet; Take 1 tablet (10 mg total) by mouth daily.  Dispense: 90 tablet; Refill: 0  Continue same regimen. F/up in 2 months.   [place] Crane, MD 08/11/2019, 11:29 AM

## 2019-08-20 ENCOUNTER — Ambulatory Visit: Payer: Medicare PPO | Attending: Internal Medicine

## 2019-08-20 DIAGNOSIS — Z23 Encounter for immunization: Secondary | ICD-10-CM

## 2019-08-20 NOTE — Progress Notes (Signed)
   Covid-19 Vaccination Clinic  Name:  Faith Santiago    MRN: [id number] DOB: October 25, 1940  08/20/2019  [name] was observed post Covid-19 immunization for 15 minutes without incident. She was provided with Vaccine Information Sheet and instruction to access the V-Safe system.   [name] was instructed to call 911 with any severe reactions post vaccine: Marland Kitchen Difficulty breathing  . Swelling of face and throat  . A fast heartbeat  . A bad rash all over body  . Dizziness and weakness   Immunizations Administered    Name Date Dose VIS Date Route   Pfizer COVID-19 Vaccine 08/20/2019 11:32 AM 0.3 mL 06/09/2018 Intramuscular   Manufacturer: [place]   Lot: [id number]   [place]: [id number]

## 2019-09-14 ENCOUNTER — Ambulatory Visit: Payer: Medicare PPO | Attending: Internal Medicine

## 2019-09-14 DIAGNOSIS — Z23 Encounter for immunization: Secondary | ICD-10-CM

## 2019-09-14 NOTE — Progress Notes (Signed)
   Covid-19 Vaccination Clinic  Name:  Faith Santiago    MRN: [id number] DOB: 08-11-1940  09/14/2019  Faith Santiago was observed post Covid-19 immunization for 15 minutes without incident. She was provided with Vaccine Information Sheet and instruction to access the V-Safe system.   Faith Santiago was instructed to call 911 with any severe reactions post vaccine: Marland Kitchen Difficulty breathing  . Swelling of face and throat  . A fast heartbeat  . A bad rash all over body  . Dizziness and weakness   Immunizations Administered    Name Date Dose VIS Date Route   Pfizer COVID-19 Vaccine 09/14/2019  9:52 AM 0.3 mL 06/09/2018 Intramuscular   Manufacturer: [place]   Lot: [id number]   [place]: [id number]

## 2019-10-06 ENCOUNTER — Other Ambulatory Visit: Payer: Self-pay

## 2019-10-06 ENCOUNTER — Telehealth (INDEPENDENT_AMBULATORY_CARE_PROVIDER_SITE_OTHER): Payer: Medicare PPO | Admitting: Psychiatry

## 2019-10-06 ENCOUNTER — Telehealth: Payer: Medicare PPO | Admitting: Psychiatry

## 2019-10-06 ENCOUNTER — Encounter (HOSPITAL_COMMUNITY): Payer: Self-pay | Admitting: Psychiatry

## 2019-10-06 DIAGNOSIS — F419 Anxiety disorder, unspecified: Secondary | ICD-10-CM | POA: Diagnosis not present

## 2019-10-06 DIAGNOSIS — F3341 Major depressive disorder, recurrent, in partial remission: Secondary | ICD-10-CM

## 2019-10-06 DIAGNOSIS — F3342 Major depressive disorder, recurrent, in full remission: Secondary | ICD-10-CM | POA: Diagnosis not present

## 2019-10-06 MED ORDER — VENLAFAXINE HCL ER 150 MG PO CP24: ORAL_CAPSULE | ORAL | 0 refills | Status: DC

## 2019-10-06 MED ORDER — VENLAFAXINE HCL ER 75 MG PO CP24: ORAL_CAPSULE | ORAL | 0 refills | Status: DC

## 2019-10-06 MED ORDER — ALPRAZOLAM 0.25 MG PO TABS: 0.2500 mg | ORAL_TABLET | Freq: Every evening | ORAL | 1 refills | Status: DC | PRN

## 2019-10-06 MED ORDER — ARIPIPRAZOLE 15 MG PO TABS: 15.0000 mg | ORAL_TABLET | Freq: Every day | ORAL | 0 refills | Status: DC

## 2019-10-06 NOTE — Progress Notes (Signed)
[facility] MD OP Progress Note  Virtual Visit via Telephone Note  I connected with Faith Santiago on 10/06/19 at 11:00 AM EDT by telephone and verified that I am speaking with the correct person using two identifiers.  Location: Patient: home Provider: Clinic   I discussed the limitations, risks, security and privacy concerns of performing an evaluation and management service by telephone and the availability of in person appointments. I also discussed with the patient that there may be a patient responsible charge related to this service. The patient expressed understanding and agreed to proceed.   I provided 18 minutes of non-face-to-face time during this encounter.    10/06/2019 11:10 AM [name]  MRN:  [id number]  Chief Complaint:  "I've been feeling depressed lately."   HPI: 79 year old female contacted today for follow-up appointment.  Patient reports that she's been experiencing depressive symptoms including low energy, not feeling like doing anything, and has been unable to clean her home lately.  Patient also reports that she's been having difficulty falling asleep at night.  Patient reports that she has been taking her medications as prescribed but would like to know if her medications could be adjusted at this time.  Provider discussed with patient that she is currently on the maximum dosage of Effexor 225 mg, but her Abilify could be increased from 10 mg to 15 mg.  Patient is agreeable to increasing Abilify dosage at this time.  Patient reports that she recently enjoyed spending time at her [place] the [place].     Visit Diagnosis:    ICD-10-CM   1. MDD (major depressive disorder), recurrent, in partial remission ([place])  F33.41     Past Psychiatric History: Depression, anxiety  Past Medical History:  Past Medical History:  Diagnosis Date  . Anemia, unspecified   . Anxiety   . Arthritis    hands R>L, neck and lower back  . Bimalleolar fracture of left  ankle 11/19/2013   [place]  . Cancer ([place])   . Cataract   . Depression   . Esophageal reflux   . GERD with stricture 06/03/2007  . Glaucoma   . History of osteopenia    DEXA WNL 2008, 2015  . History of uterine cancer 1999   s/p hysterectomy  . HLD (hyperlipidemia)   . Narcolepsy   . Thyroid disease     Past Surgical History:  Procedure Laterality Date  . bilateral catarct removal  2010  . COLONOSCOPY  05/2003   WNL (Dr Velora Heckler)  . COLONOSCOPY  11/2017   3 small TA, diverticulisis, no f/u needed Carlean Purl)  . DEXA  03/2014   WNL T score +3.9  . ESOPHAGOGASTRODUODENOSCOPY  11/2017   dilated esoph stenosis, 8cm HH ([facility])  . ESOPHAGOGASTRODUODENOSCOPY  12/2017   repeat dilation of esoph stenosis, 5cm HH - rec stay on PPI Carlean Purl)  . ORIF ANKLE FRACTURE Left 11/19/2013   Procedure: LEFT ANKLE FRACTURE OPEN TREATMENT BIMALLEOLAR ANKLE INCLUDES INTERNAL FIXATION ;  Surgeon: Johnny Bridge, MD  . RETINAL DETACHMENT REPAIR W/ SCLERAL BUCKLE LE Right 03/21/2016  . TOTAL ABDOMINAL HYSTERECTOMY  1999   uterine cancer    Family Psychiatric History: depression- mom  Family History:  Family History  Problem Relation Age of Onset  . Hypothyroidism Mother   . Stroke Mother        ministroke  . Coronary artery disease Mother 35       s/p some heart surgery  . Prostate cancer Father   .  Diabetes Neg Hx   . Colon cancer Neg Hx   . Esophageal cancer Neg Hx   . Pancreatic cancer Neg Hx   . Stomach cancer Neg Hx   . Liver disease Neg Hx   . Rectal cancer Neg Hx     Social History:  Social History   Socioeconomic History  . Marital status: Married    Spouse name: Not on file  . Number of children: Not on file  . Years of education: Not on file  . Highest education level: Not on file  Occupational History  . Occupation: retired-occupational therapist  Tobacco Use  . Smoking status: Never Smoker  . Smokeless tobacco: Never Used  Vaping Use  . Vaping Use: Never used   Substance and Sexual Activity  . Alcohol use: Yes    Alcohol/week: 7.0 - 12.0 standard drinks    Types: 7 - 10 Glasses of wine per week    Comment: 1 drink /day  . Drug use: No  . Sexual activity: Never  Other Topics Concern  . 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 Difficulty of Paying Living Expenses:   Food Insecurity:   . Worried About Charity fundraiser in the Last Year:   . Arboriculturist in the Last Year:   Transportation Needs:   . Film/video editor (Medical):   Marland Kitchen Lack of Transportation (Non-Medical):   Physical Activity:   . Days of Exercise per Week:   . Minutes of Exercise per Session:   Stress:   . Feeling of Stress :   Social Connections:   . Frequency of Communication with Friends and Family:   . Frequency of Social Gatherings with Friends and Family:   . Attends Religious Services:   . Active Member of Clubs or Organizations:   . Attends Archivist Meetings:   Marland Kitchen Marital Status:     Allergies:  Allergies  Allergen Reactions  . Ciprofloxacin     REACTION: sun induced rash    Metabolic Disorder Labs: Lab Results  Component Value Date   HGBA1C 5.9 10/29/2018   No results found for: PROLACTIN Lab Results  Component Value Date   CHOL 288 (H) 11/02/2018   TRIG 316.0 (H) 11/02/2018   HDL 68.50 11/02/2018   CHOLHDL 4 11/02/2018   VLDL 63.2 (H) 11/02/2018   LDLCALC 146 (H) 08/06/2016   LDLCALC 157 (H) 03/17/2014   Lab Results  Component Value Date   TSH 1.73 10/29/2018   TSH 1.52 10/15/2017    Therapeutic Level Labs: No results found for: LITHIUM No results found for: VALPROATE No components found  for:  CBMZ  Current Medications: Current Outpatient Medications  Medication Sig Dispense Refill  . ALPRAZolam (XANAX) 0.25 MG tablet Take 1 tablet (0.25 mg total) by mouth at bedtime as needed for anxiety. 30 tablet 1  . ARIPiprazole (ABILIFY) 10 MG tablet Take 1 tablet (10 mg total) by mouth daily. 90 tablet 0  . atorvastatin (LIPITOR) 20 MG tablet TAKE 1 TABLET BY MOUTH EVERY DAY 90 tablet 2  . Cholecalciferol (VITAMIN D) 50 MCG (2000  UT) CAPS Take 1 capsule (2,000 Units total) by mouth daily. 30 capsule   . levothyroxine (SYNTHROID) 50 MCG tablet TAKE 1 TABLET (50 MCG TOTAL) BY MOUTH DAILY. 90 tablet 3  . omeprazole (PRILOSEC) 40 MG capsule TAKE 1 CAPSULE BY MOUTH  DAILY BEFORE BREAKFAST 90 capsule 3  . venlafaxine XR (EFFEXOR XR) 75 MG 24 hr capsule Take 1 capsule with 150 mg capsule daily 90 capsule 0  . venlafaxine XR (EFFEXOR-XR) 150 MG 24 hr capsule Take 1 capsule with 75 mg capsule daily (total dose 225 mg daily) 90 capsule 0   No current facility-administered medications for this visit.      Psychiatric Specialty Exam: ROS  There were no vitals taken for this visit.There is no height or weight on file to calculate BMI.  General Appearance:  unable to assess due to phone visit  Eye Contact:   unable to assess due to phone visit  Speech:  Clear and Coherent and Normal Rate  Volume:  Normal  Mood:  Depressed  Affect:  Depressed  Thought Process:  Goal Directed and Linear  Orientation:  Full (Time, Place, and Person)  Thought Content: Logical   Suicidal Thoughts:  No  Homicidal Thoughts:  No  Memory:  Recent;   Good Remote;   Good  Judgement:  Good  Insight:  Fair  Psychomotor Activity:  Unable to assess via telehealth visit  Concentration:  Concentration: Fair and Attention Span: Fair  Recall:  Good  Fund of Knowledge: Good  Language: Fair  Akathisia:  Negative  Handed:  Right  AIMS (if indicated): not done  Assets:  Communication Skills Desire for  Improvement Financial Resources/Insurance Housing Social Support  ADL's:  Intact  Cognition: WNL  Sleep:  Fair   Screenings: GAD-7     Office Visit from 06/01/2015 in [place] at [place] from 10/15/2017 in [place] at [place] from 04/02/2016 in [place] at [facility]  Total Score (max 30 points ) 20 20    PHQ2-9     Office Visit from 11/02/2018 in [place] at [place] from 10/15/2017 in [place] at [place] from 08/13/2016 in [place] at [place] from 04/02/2016 in [place] at [place] from 06/01/2015 in [place] at [facility] Total Score 6 0 6 0 6  PHQ-9 Total Score 16 0 13 -- 25       Assessment and Plan: Patient reports that she has been feeling depressed lately. Provider offered to increase Abilify dose at this time. Patient is agreeable to increase Abilify dosage to 15 mg at this time.  1. MDD (major depressive disorder), recurrent, in partial remission (HCC) Increase Abilify 15 mg PO daily Continue Effexor 225 mg PO daily Continue Xanax 0.25 mg PO at bedtime    Follow-up in 1 month. Patient was offered appointment to come in person for next visit.   [place]evada Crane, MD 10/06/2019, 11:10 AM

## 2019-11-04 ENCOUNTER — Ambulatory Visit (HOSPITAL_COMMUNITY): Payer: Medicare PPO | Admitting: Psychiatry

## 2019-11-10 ENCOUNTER — Ambulatory Visit (INDEPENDENT_AMBULATORY_CARE_PROVIDER_SITE_OTHER): Payer: Medicare PPO | Admitting: Psychiatry

## 2019-11-10 ENCOUNTER — Encounter (HOSPITAL_COMMUNITY): Payer: Self-pay | Admitting: Psychiatry

## 2019-11-10 ENCOUNTER — Other Ambulatory Visit: Payer: Self-pay

## 2019-11-10 DIAGNOSIS — F419 Anxiety disorder, unspecified: Secondary | ICD-10-CM | POA: Diagnosis not present

## 2019-11-10 DIAGNOSIS — F3341 Major depressive disorder, recurrent, in partial remission: Secondary | ICD-10-CM | POA: Diagnosis not present

## 2019-11-10 MED ORDER — VENLAFAXINE HCL ER 150 MG PO CP24: ORAL_CAPSULE | ORAL | 0 refills | Status: DC

## 2019-11-10 MED ORDER — ALPRAZOLAM 0.25 MG PO TABS: 0.2500 mg | ORAL_TABLET | Freq: Every evening | ORAL | 1 refills | Status: DC | PRN

## 2019-11-10 MED ORDER — BUPROPION HCL ER (XL) 150 MG PO TB24: 150.0000 mg | ORAL_TABLET | ORAL | 1 refills | Status: DC

## 2019-11-10 MED ORDER — ARIPIPRAZOLE 15 MG PO TABS: 15.0000 mg | ORAL_TABLET | Freq: Every day | ORAL | 0 refills | Status: DC

## 2019-11-10 MED ORDER — VENLAFAXINE HCL ER 75 MG PO CP24: ORAL_CAPSULE | ORAL | 0 refills | Status: DC

## 2019-11-10 NOTE — Progress Notes (Signed)
[facility] MD OP Progress Note   11/10/2019 10:54 AM Faith Santiago  MRN:  [id number]  Chief Complaint:  "I feel better but people around me do not feel that way."   HPI: Patient had requested an in office appointment for follow-up this time. She reported that she found increasing the dose of Abilify to 15 mg dose to be helpful.  She stated that she is not calmer and not as tearful as she used to be. However the people around her including her husband her son and daughter her neighbor and also her sister who lives out of state in [place] believe that she is not like herself.  They are concerned that she is not managing things the way she used to.  They feel that she is not as efficient and is not getting much accomplished like she used to. Patient stated that she has low energy levels when she wakes up in the morning and does not feel like getting out of bed.  She still gets tearful however is not as bad as it was a few weeks ago.  She is able to sleep with the help of Xanax at bedtime. She stated that she feels like she has no motivation to do anything.  She gets distracted when she tries to do something.  She stated that her sister is worried about her as she was supposed to be working on a quilt to send it over to her however that has been pending for a while now.  Her husband gets mad at her as she is not doing the chores on the house that she used to. She denied any suicidal ideations.  Patient was recommended addition of Wellbutrin to her regimen in the morning to help with the depressed mood, low energy levels and motivation levels.  Writer wanted to cut back Abilify back to 10 mg however since the patient reported that increasing the dose did help her to some extent we would leave it at 50 mg dose for now.   Visit Diagnosis:    ICD-10-CM   1. Anxiety  F41.9 ALPRAZolam (XANAX) 0.25 MG tablet  2. MDD (major depressive disorder), recurrent, in partial remission (HCC)  F33.41 ARIPiprazole (ABILIFY)  15 MG tablet    venlafaxine XR (EFFEXOR XR) 75 MG 24 hr capsule    venlafaxine XR (EFFEXOR-XR) 150 MG 24 hr capsule    buPROPion (WELLBUTRIN XL) 150 MG 24 hr tablet    Past Psychiatric History: Depression, anxiety  Past Medical History:  Past Medical History:  Diagnosis Date  . Anemia, unspecified   . Anxiety   . Arthritis    hands R>L, neck and lower back  . Bimalleolar fracture of left ankle 11/19/2013   [place]  . Cancer ([place])   . Cataract   . Depression   . Esophageal reflux   . GERD with stricture 06/03/2007  . Glaucoma   . History of osteopenia    DEXA WNL 2008, 2015  . History of uterine cancer 1999   s/p hysterectomy  . HLD (hyperlipidemia)   . Narcolepsy   . Thyroid disease     Past Surgical History:  Procedure Laterality Date  . bilateral catarct removal  2010  . COLONOSCOPY  05/2003   WNL (Dr Velora Heckler)  . COLONOSCOPY  11/2017   3 small TA, diverticulisis, no f/u needed Carlean Purl)  . DEXA  03/2014   WNL T score +3.9  . ESOPHAGOGASTRODUODENOSCOPY  11/2017   dilated esoph stenosis, 8cm HH ([facility])  .  ESOPHAGOGASTRODUODENOSCOPY  12/2017   repeat dilation of esoph stenosis, 5cm HH - rec stay on PPI Carlean Purl)  . ORIF ANKLE FRACTURE Left 11/19/2013   Procedure: LEFT ANKLE FRACTURE OPEN TREATMENT BIMALLEOLAR ANKLE INCLUDES INTERNAL FIXATION ;  Surgeon: Johnny Bridge, MD  . RETINAL DETACHMENT REPAIR W/ SCLERAL BUCKLE LE Right 03/21/2016  . TOTAL ABDOMINAL HYSTERECTOMY  1999   uterine cancer    Family Psychiatric History: depression- mom  Family History:  Family History  Problem Relation Age of Onset  . Hypothyroidism Mother   . Stroke Mother        ministroke  . Coronary artery disease Mother 6       s/p some heart surgery  . Prostate cancer Father   . Diabetes Neg Hx   . Colon cancer Neg Hx   . Esophageal cancer Neg Hx   . Pancreatic cancer Neg Hx   . Stomach cancer Neg Hx   . Liver disease Neg Hx   . Rectal cancer Neg Hx     Social History:   Social History   Socioeconomic History  . Marital status: Married    Spouse name: Not on file  . Number of children: Not on file  . Years of education: Not on file  . Highest education level: Not on file  Occupational History  . Occupation: retired-occupational therapist  Tobacco Use  . Smoking status: Never Smoker  . Smokeless tobacco: Never Used  Vaping Use  . Vaping Use: Never used  Substance and Sexual Activity  . Alcohol use: Yes    Alcohol/week: 7.0 - 12.0 standard drinks    Types: 7 - 10 Glasses of wine per week    Comment: 1 drink /day  . Drug use: No  . Sexual activity: Never  Other Topics Concern  . 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 Difficulty of Paying Living Expenses:   Food Insecurity:   . Worried About Charity fundraiser in the Last Year:   . Arboriculturist in the Last Year:   Transportation Needs:   . Film/video editor (Medical):   Marland Kitchen Lack of Transportation (Non-Medical):   Physical Activity:   . Days of Exercise per Week:   . Minutes of Exercise per Session:   Stress:   . Feeling of Stress :   Social Connections:   . Frequency of Communication with Friends and Family:   . Frequency of Social Gatherings with Friends and Family:   . Attends Religious Services:   . Active Member of Clubs or Organizations:   . Attends Archivist Meetings:   Marland Kitchen Marital Status:     Allergies:  Allergies  Allergen Reactions  . Ciprofloxacin     REACTION: sun induced rash    Metabolic Disorder Labs: Lab Results  Component Value Date   HGBA1C 5.9 10/29/2018   No results found for: PROLACTIN Lab Results  Component  Value Date   CHOL 288 (H) 11/02/2018   TRIG 316.0 (H) 11/02/2018   HDL 68.50 11/02/2018   CHOLHDL 4 11/02/2018   VLDL 63.2 (H) 11/02/2018  LDLCALC 146 (H) 08/06/2016   LDLCALC 157 (H) 03/17/2014   Lab Results  Component Value Date   TSH 1.73 10/29/2018   TSH 1.52 10/15/2017    Therapeutic Level Labs: No results found for: LITHIUM No results found for: VALPROATE No components found for:  CBMZ  Current Medications: Current Outpatient Medications  Medication Sig Dispense Refill  . ALPRAZolam (XANAX) 0.25 MG tablet Take 1 tablet (0.25 mg total) by mouth at bedtime as needed for anxiety. 30 tablet 1  . ARIPiprazole (ABILIFY) 15 MG tablet Take 1 tablet (15 mg total) by mouth daily. 90 tablet 0  . atorvastatin (LIPITOR) 20 MG tablet TAKE 1 TABLET BY MOUTH EVERY DAY 90 tablet 2  . buPROPion (WELLBUTRIN XL) 150 MG 24 hr tablet Take 1 tablet (150 mg total) by mouth every morning. 30 tablet 1  . Cholecalciferol (VITAMIN D) 50 MCG (2000 UT) CAPS Take 1 capsule (2,000 Units total) by mouth daily. 30 capsule   . levothyroxine (SYNTHROID) 50 MCG tablet TAKE 1 TABLET (50 MCG TOTAL) BY MOUTH DAILY. 90 tablet 3  . omeprazole (PRILOSEC) 40 MG capsule TAKE 1 CAPSULE BY MOUTH  DAILY BEFORE BREAKFAST 90 capsule 3  . venlafaxine XR (EFFEXOR XR) 75 MG 24 hr capsule Take 1 capsule with 150 mg capsule daily 90 capsule 0  . venlafaxine XR (EFFEXOR-XR) 150 MG 24 hr capsule Take 1 capsule with 75 mg capsule daily (total dose 225 mg daily) 90 capsule 0   No current facility-administered medications for this visit.      Psychiatric Specialty Exam: ROS  There were no vitals taken for this visit.There is no height or weight on file to calculate BMI.  General Appearance:  Well groomed  Eye Contact:   Good  Speech:  Clear and Coherent and Normal Rate  Volume:  Normal  Mood:  Depressed  Affect:  Depressed  Thought Process:  Goal Directed and Linear  Orientation:  Full (Time, Place, and Person)  Thought  Content: Logical   Suicidal Thoughts:  No  Homicidal Thoughts:  No  Memory:  Recent;   Good Remote;   Good  Judgement:  Good  Insight:  Fair  Psychomotor Activity:  Unable to assess via telehealth visit  Concentration:  Concentration: Fair and Attention Span: Fair  Recall:  Good  Fund of Knowledge: Good  Language: Fair  Akathisia:  Negative  Handed:  Right  AIMS (if indicated): not done  Assets:  Communication Skills Desire for Improvement Financial Resources/Insurance Housing Social Support  ADL's:  Intact  Cognition: WNL  Sleep:  Fair   Screenings: GAD-7     Office Visit from 06/01/2015 in [place] at [place] from 10/15/2017 in [place] at [place] from 04/02/2016 in [place] at [facility]  Total Score (max 30 points ) 20 20    PHQ2-9     Office Visit from 11/02/2018 in [place] at [place] from 10/15/2017 in [place] at [place] from 08/13/2016 in [place] at [place] from 04/02/2016 in [place] at [place] from 06/01/2015 in [place] at [facility] Total Score 6 0 6 0 6  PHQ-9 Total Score 16 0 13 -- 25       Assessment and Plan: Patient reported some improvement in her mood with the help of increase in  Abilify however she continues to endorse low energy levels and low motivation levels.  Her family members and neighbor are concerned about her.  She was agreeable to the recommendation of adding Wellbutrin XL 150 mg in the morning to regimen. Based on the response, the plan is to cut back on her Abilify at the time of her next visit.    1. Anxiety  - ALPRAZolam (XANAX) 0.25 MG tablet; Take 1 tablet (0.25 mg total) by mouth at bedtime as needed for anxiety.  Dispense: 30 tablet; Refill: 1  2. MDD (major depressive disorder),  recurrent, in partial remission (HCC)  - ARIPiprazole (ABILIFY) 15 MG tablet; Take 1 tablet (15 mg total) by mouth daily.  Dispense: 90 tablet; Refill: 0 - venlafaxine XR (EFFEXOR XR) 75 MG 24 hr capsule; Take 1 capsule with 150 mg capsule daily  Dispense: 90 capsule; Refill: 0 - venlafaxine XR (EFFEXOR-XR) 150 MG 24 hr capsule; Take 1 capsule with 75 mg capsule daily (total dose 225 mg daily)  Dispense: 90 capsule; Refill: 0 - Start buPROPion (WELLBUTRIN XL) 150 MG 24 hr tablet; Take 1 tablet (150 mg total) by mouth every morning.  Dispense: 30 tablet; Refill: 1  Follow-up in 6 weeks. Patient was suggested to have a family member accompany her for her next visit so more collateral information can be gathered.  [place] Crane, MD 11/10/2019, 10:54 AM

## 2019-12-03 ENCOUNTER — Other Ambulatory Visit (HOSPITAL_COMMUNITY): Payer: Self-pay | Admitting: Psychiatry

## 2019-12-03 DIAGNOSIS — F3341 Major depressive disorder, recurrent, in partial remission: Secondary | ICD-10-CM

## 2019-12-15 HISTORY — PX: ESOPHAGOGASTRODUODENOSCOPY: SHX1529

## 2019-12-16 ENCOUNTER — Ambulatory Visit: Payer: Medicare PPO | Admitting: Internal Medicine

## 2019-12-16 ENCOUNTER — Encounter: Payer: Self-pay | Admitting: Internal Medicine

## 2019-12-16 VITALS — BP 90/62 | HR 92 | Ht 64.0 in | Wt 201.6 lb

## 2019-12-16 DIAGNOSIS — K222 Esophageal obstruction: Secondary | ICD-10-CM | POA: Diagnosis not present

## 2019-12-16 DIAGNOSIS — R1319 Other dysphagia: Secondary | ICD-10-CM

## 2019-12-16 DIAGNOSIS — R1310 Dysphagia, unspecified: Secondary | ICD-10-CM

## 2019-12-16 NOTE — Patient Instructions (Signed)
Normal BMI (Body Mass Index- based on height and weight) is between 23 and 30. Your BMI today is Body mass index is 34.6 kg/m. Marland Kitchen Please consider follow up  regarding your BMI with your Primary Care Provider.  You have been scheduled for an endoscopy. Please follow written instructions given to you at your visit today. If you use inhalers (even only as needed), please bring them with you on the day of your procedure.  I appreciate the opportunity to care for you. Silvano Rusk, MD, [facility]

## 2019-12-16 NOTE — Progress Notes (Signed)
Faith Santiago 79 y.o. [birth year] [id number]  Assessment & Plan:   Encounter Diagnoses  Name Primary?  . Esophageal dysphagia Yes  . Esophageal stricture     Schedule EGD with likely balloon dilation  Keep in mind that Abilify can cause dysphagia also  The risks and benefits as well as alternatives of endoscopic procedure(s) have been discussed and reviewed. All questions answered. The patient agrees to proceed.    Subjective:   Chief Complaint: dysphagia  HPI Faith Santiago is here for follow-up with a history of GERD with stricture status post dilation to 18 mm in September 2019 after a lesser dilation in August 2019. She had done well for some time but since Christmas of last year she has had some intermittent dysphagia. No significant weight loss changes. Denies GERD symptoms on her PPI.  Wt Readings from Last 3 Encounters:  12/16/19 201 lb 9.6 oz (91.4 kg)  11/02/18 207 lb 11.2 oz (94.2 kg)  12/16/17 199 lb 12.8 oz (90.6 kg)     Allergies  Allergen Reactions  . Ciprofloxacin     REACTION: sun induced rash   Current Meds  Medication Sig  . ALPRAZolam (XANAX) 0.25 MG tablet Take 1 tablet (0.25 mg total) by mouth at bedtime as needed for anxiety.  . ARIPiprazole (ABILIFY) 15 MG tablet Take 1 tablet (15 mg total) by mouth daily.  Marland Kitchen atorvastatin (LIPITOR) 20 MG tablet TAKE 1 TABLET BY MOUTH EVERY DAY  . buPROPion (WELLBUTRIN XL) 150 MG 24 hr tablet TAKE 1 TABLET BY MOUTH EVERY DAY IN THE MORNING  . Cholecalciferol (VITAMIN D) 50 MCG (2000 UT) CAPS Take 1 capsule (2,000 Units total) by mouth daily.  Marland Kitchen levothyroxine (SYNTHROID) 50 MCG tablet TAKE 1 TABLET (50 MCG TOTAL) BY MOUTH DAILY.  Marland Kitchen omeprazole (PRILOSEC) 40 MG capsule TAKE 1 CAPSULE BY MOUTH  DAILY BEFORE BREAKFAST  . venlafaxine XR (EFFEXOR XR) 75 MG 24 hr capsule Take 1 capsule with 150 mg capsule daily  . venlafaxine XR (EFFEXOR-XR) 150 MG 24 hr capsule Take 1 capsule with 75 mg capsule daily (total dose 225 mg  daily)   Past Medical History:  Diagnosis Date  . Anemia, unspecified   . Anxiety   . Arthritis    hands R>L, neck and lower back  . Bimalleolar fracture of left ankle 11/19/2013   [place]  . Cancer ([place])   . Cataract   . Depression   . Esophageal reflux   . GERD with stricture 06/03/2007  . Glaucoma   . History of osteopenia    DEXA WNL 2008, 2015  . History of uterine cancer 1999   s/p hysterectomy  . HLD (hyperlipidemia)   . Narcolepsy   . Thyroid disease    Past Surgical History:  Procedure Laterality Date  . bilateral catarct removal  2010  . COLONOSCOPY  05/2003   WNL (Dr Velora Heckler)  . COLONOSCOPY  11/2017   3 small TA, diverticulisis, no f/u needed Carlean Purl)  . DEXA  03/2014   WNL T score +3.9  . ESOPHAGOGASTRODUODENOSCOPY  11/2017   dilated esoph stenosis, 8cm HH (Shalandra Leu)  . ESOPHAGOGASTRODUODENOSCOPY  12/2017   repeat dilation of esoph stenosis, 5cm HH - rec stay on PPI Carlean Purl)  . ORIF ANKLE FRACTURE Left 11/19/2013   Procedure: LEFT ANKLE FRACTURE OPEN TREATMENT BIMALLEOLAR ANKLE INCLUDES INTERNAL FIXATION ;  Surgeon: Johnny Bridge, MD  . RETINAL DETACHMENT REPAIR W/ SCLERAL BUCKLE LE Right 03/21/2016  . 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   Review of Systems   Objective:   Physical Exam BP 90/62   Pulse 92   Ht 5\' 4"  (1.626 m)   Wt 201 lb 9.6 oz (91.4 kg)   BMI 34.60 kg/m  NAD Lungs CTA Cor NL

## 2019-12-22 ENCOUNTER — Encounter: Payer: Medicare PPO | Admitting: Internal Medicine

## 2019-12-23 ENCOUNTER — Telehealth: Payer: Self-pay | Admitting: Internal Medicine

## 2019-12-23 ENCOUNTER — Encounter: Payer: Medicare PPO | Admitting: Internal Medicine

## 2019-12-24 ENCOUNTER — Encounter: Payer: Self-pay | Admitting: *Deleted

## 2019-12-29 NOTE — Telephone Encounter (Signed)
Appointment rescheduled for 01-03-2020.

## 2019-12-31 ENCOUNTER — Encounter (HOSPITAL_COMMUNITY): Payer: Self-pay | Admitting: Psychiatry

## 2019-12-31 ENCOUNTER — Other Ambulatory Visit: Payer: Self-pay

## 2019-12-31 ENCOUNTER — Ambulatory Visit (INDEPENDENT_AMBULATORY_CARE_PROVIDER_SITE_OTHER): Payer: Medicare PPO | Admitting: Psychiatry

## 2019-12-31 DIAGNOSIS — F419 Anxiety disorder, unspecified: Secondary | ICD-10-CM

## 2019-12-31 DIAGNOSIS — F3341 Major depressive disorder, recurrent, in partial remission: Secondary | ICD-10-CM

## 2019-12-31 MED ORDER — VENLAFAXINE HCL ER 150 MG PO CP24: ORAL_CAPSULE | ORAL | 0 refills | Status: DC

## 2019-12-31 MED ORDER — ARIPIPRAZOLE 5 MG PO TABS: 5.0000 mg | ORAL_TABLET | Freq: Every day | ORAL | 0 refills | Status: DC

## 2019-12-31 MED ORDER — ALPRAZOLAM 0.25 MG PO TABS: 0.2500 mg | ORAL_TABLET | Freq: Every evening | ORAL | 1 refills | Status: DC | PRN

## 2019-12-31 MED ORDER — BUPROPION HCL ER (XL) 150 MG PO TB24: 150.0000 mg | ORAL_TABLET | Freq: Every morning | ORAL | 0 refills | Status: DC

## 2019-12-31 MED ORDER — VENLAFAXINE HCL ER 75 MG PO CP24: ORAL_CAPSULE | ORAL | 0 refills | Status: DC

## 2019-12-31 NOTE — Progress Notes (Signed)
[facility] MD OP Progress Note   12/31/2019 11:00 AM Faith Santiago  MRN:  [id number]  Chief Complaint:  "I feel I am doing better."   HPI: Pt was seen with her husband.  Patient stated that she feels that Wellbutrin has been helpful.  She noticed improvement in her energy levels and her mood.  She thinks she is on the right direction for now. However her husband did not agree with this.  He stated that he is concerned that patient is spending too much time in the bed sleeping for 12 to 14 hours/day.  He stated that she will wake up in the morning and sit on the couch and watch TV and then after a couple of hours will go back to taking a long nap and then wake up for lunch and then do the same after lunch. He stated that he is concerned that she is barely getting any steps during the day and is staying inside the house mostly confined to her bedroom or in the kitchen. He stated that he feels that drugs or medicines are not the answer.  Patient stated that she does acknowledged that she does lay down to take a nap after breakfast however she does not agree with him when he states that she is sleeping majority of the time  She stated that she is gradually improved her amount of activity levels.  Husband stated that ever since they both had Covid in February patient has not been able to bounce back.  He informed that they have someone who cleans her house and he still does the laundry and most of the cooking around the house.  He stated that she hardly ever helps with lunch or supper preparation.  Patient was receptive to the suggestion of cutting down her Abilify dose as it seems to be adding to her daytime fatigue.  She is also receptive to the suggestion of trying to increase her activity level including walking around the house.   Visit Diagnosis:    ICD-10-CM   1. MDD (major depressive disorder), recurrent, in partial remission ([place])  F33.41     Past Psychiatric History: Depression,  anxiety  Past Medical History:  Past Medical History:  Diagnosis Date  . Anemia, unspecified   . Anxiety   . Arthritis    hands R>L, neck and lower back  . Bimalleolar fracture of left ankle 11/19/2013   [place]  . Cataract   . COVID-19 05/2019  . Depression   . Esophageal reflux   . GERD with stricture 06/03/2007  . Glaucoma   . History of osteopenia    DEXA WNL 2008, 2015  . History of uterine cancer 1999   s/p hysterectomy  . HLD (hyperlipidemia)   . Narcolepsy   . Thyroid disease   . Uterine cancer [facility])     Past Surgical History:  Procedure Laterality Date  . bilateral catarct removal  2010  . COLONOSCOPY  05/2003   WNL (Dr Velora Heckler)  . COLONOSCOPY  11/2017   3 small TA, diverticulisis, no f/u needed Carlean Purl)  . DEXA  03/2014   WNL T score +3.9  . ESOPHAGOGASTRODUODENOSCOPY  11/2017   dilated esoph stenosis, 8cm HH ([facility])  . ESOPHAGOGASTRODUODENOSCOPY  12/2017   repeat dilation of esoph stenosis, 5cm HH - rec stay on PPI Carlean Purl)  . ORIF ANKLE FRACTURE Left 11/19/2013   Procedure: LEFT ANKLE FRACTURE OPEN TREATMENT BIMALLEOLAR ANKLE INCLUDES INTERNAL FIXATION ;  Surgeon: Johnny Bridge, MD  .  RETINAL DETACHMENT REPAIR W/ SCLERAL BUCKLE LE Right 03/21/2016  . TOTAL ABDOMINAL HYSTERECTOMY  1999   uterine cancer    Family Psychiatric History: depression- mom  Family History:  Family History  Problem Relation Age of Onset  . Hypothyroidism Mother   . Stroke Mother        ministroke  . Coronary artery disease Mother 51       s/p some heart surgery  . Prostate cancer Father   . Diabetes Neg Hx   . Colon cancer Neg Hx   . Esophageal cancer Neg Hx   . Pancreatic cancer Neg Hx   . Stomach cancer Neg Hx   . Liver disease Neg Hx   . Rectal cancer Neg Hx     Social History:  Social History   Socioeconomic History  . Marital status: Married    Spouse name: Not on file  . Number of children: Not on file  . Years of education: Not on file  . Highest  education level: Not on file  Occupational History  . Occupation: retired-occupational therapist  Tobacco Use  . Smoking status: Never Smoker  . Smokeless tobacco: Never Used  Vaping Use  . Vaping Use: Never used  Substance and Sexual Activity  . Alcohol use: Yes    Alcohol/week: 7.0 - 12.0 standard drinks    Types: 7 - 10 Glasses of wine per week    Comment: 2 drinks a week  . Drug use: No  . Sexual activity: Never  Other Topics Concern  . 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 Difficulty of Paying Living Expenses: Not on file  Food Insecurity:   . Worried About Charity fundraiser in the Last Year: Not on file  . Ran Out of Food in the Last Year: Not on file  Transportation Needs:   . Lack of Transportation (Medical): Not on file  . Lack of Transportation (Non-Medical): Not on file  Physical Activity:   . Days of Exercise per Week: Not on file  . Minutes of Exercise per Session: Not on file  Stress:   . Feeling of Stress : Not on file  Social Connections:   . Frequency of Communication with Friends and Family: Not on file  . Frequency of Social Gatherings with Friends and Family: Not on file  . Attends Religious Services: Not on file  . Active Member of Clubs or Organizations: Not on file  . Attends Archivist Meetings: Not on file  . Marital Status: Not on file    Allergies:  Allergies  Allergen Reactions  . Ciprofloxacin     REACTION: sun induced rash    Metabolic Disorder Labs: Lab Results  Component Value Date   HGBA1C 5.9 10/29/2018   No results found for: PROLACTIN Lab Results  Component Value Date   CHOL 288 (H)  11/02/2018   TRIG 316.0 (H) 11/02/2018   HDL 68.50 11/02/2018   CHOLHDL 4 11/02/2018   VLDL 63.2 (H) 11/02/2018   LDLCALC 146 (H) 08/06/2016   LDLCALC 157 (H) 03/17/2014   Lab Results  Component Value Date  TSH 1.73 10/29/2018   TSH 1.52 10/15/2017    Therapeutic Level Labs: No results found for: LITHIUM No results found for: VALPROATE No components found for:  CBMZ  Current Medications: Current Outpatient Medications  Medication Sig Dispense Refill  . ALPRAZolam (XANAX) 0.25 MG tablet Take 1 tablet (0.25 mg total) by mouth at bedtime as needed for anxiety. 30 tablet 1  . ARIPiprazole (ABILIFY) 15 MG tablet Take 1 tablet (15 mg total) by mouth daily. 90 tablet 0  . atorvastatin (LIPITOR) 20 MG tablet TAKE 1 TABLET BY MOUTH EVERY DAY 90 tablet 2  . buPROPion (WELLBUTRIN XL) 150 MG 24 hr tablet TAKE 1 TABLET BY MOUTH EVERY DAY IN THE MORNING 90 tablet 1  . Cholecalciferol (VITAMIN D) 50 MCG (2000 UT) CAPS Take 1 capsule (2,000 Units total) by mouth daily. 30 capsule   . levothyroxine (SYNTHROID) 50 MCG tablet TAKE 1 TABLET (50 MCG TOTAL) BY MOUTH DAILY. 90 tablet 3  . omeprazole (PRILOSEC) 40 MG capsule TAKE 1 CAPSULE BY MOUTH  DAILY BEFORE BREAKFAST 90 capsule 3  . venlafaxine XR (EFFEXOR XR) 75 MG 24 hr capsule Take 1 capsule with 150 mg capsule daily 90 capsule 0  . venlafaxine XR (EFFEXOR-XR) 150 MG 24 hr capsule Take 1 capsule with 75 mg capsule daily (total dose 225 mg daily) 90 capsule 0   No current facility-administered medications for this visit.      Psychiatric Specialty Exam: ROS  There were no vitals taken for this visit.There is no height or weight on file to calculate BMI.  General Appearance:  Fairly groomed  Eye Contact:   Good  Speech:  Clear and Coherent and Normal Rate  Volume:  Normal  Mood:  Depressed  Affect:  Depressed  Thought Process:  Goal Directed and Linear  Orientation:  Full (Time, Place, and Person)  Thought Content: Logical   Suicidal  Thoughts:  No  Homicidal Thoughts:  No  Memory:  Recent;   Good Remote;   Good  Judgement:  Good  Insight:  Fair  Psychomotor Activity:  Unable to assess via telehealth visit  Concentration:  Concentration: Fair and Attention Span: Fair  Recall:  Good  Fund of Knowledge: Good  Language: Fair  Akathisia:  Negative  Handed:  Right  AIMS (if indicated): not done  Assets:  Communication Skills Desire for Improvement Financial Resources/Insurance Housing Social Support  ADL's:  Intact  Cognition: WNL  Sleep:  Fair   Screenings: GAD-7     Office Visit from 06/01/2015 in [place] at [place] from 10/15/2017 in [place] at [place] from 04/02/2016 in [place] at [facility]  Total Score (max 30 points ) 20 20    PHQ2-9     Office Visit from 11/02/2018 in [place] at [place] from 10/15/2017 in [place] at [place] from 08/13/2016 in [place] at [place] from 04/02/2016 in [place] at [place] from 06/01/2015 in [place] at [facility] Total Score 6 0 6 0 6  PHQ-9 Total Score 16 0 13 -- 25       Assessment and Plan: Patient reported improvement in her mood and energy levels however her husband does not concur.  Patient was agreeable to cutting down the dose of Abilify to 5 mg to see if that  would help with the daytime fatigue.  1. MDD (major depressive disorder), recurrent, in partial remission ([facility])  - buPROPion (WELLBUTRIN XL) 150 MG 24 hr tablet; Take 1 tablet (150 mg total) by mouth in the morning.  Dispense: 90 tablet; Refill: 0 - venlafaxine XR (EFFEXOR XR) 75 MG 24 hr capsule; Take 1 capsule with 150 mg capsule daily  Dispense: 90 capsule; Refill: 0 - venlafaxine XR (EFFEXOR-XR) 150 MG 24 hr capsule; Take 1 capsule with 75 mg capsule  daily (total dose 225 mg daily)  Dispense: 90 capsule; Refill: 0 - Reduce ARIPiprazole (ABILIFY) 5 MG tablet; Take 1 tablet (5 mg total) by mouth daily.  Dispense: 90 tablet; Refill: 0  2. Anxiety  - ALPRAZolam (XANAX) 0.25 MG tablet; Take 1 tablet (0.25 mg total) by mouth at bedtime as needed for anxiety.  Dispense: 30 tablet; Refill: 1   Follow-up in 6 weeks.   [place] Crane, MD 12/31/2019, 11:00 AM

## 2020-01-03 ENCOUNTER — Encounter: Payer: Self-pay | Admitting: Internal Medicine

## 2020-01-03 ENCOUNTER — Ambulatory Visit (AMBULATORY_SURGERY_CENTER): Payer: Medicare PPO | Admitting: Internal Medicine

## 2020-01-03 ENCOUNTER — Other Ambulatory Visit: Payer: Self-pay

## 2020-01-03 VITALS — BP 118/82 | HR 85 | Temp 96.8°F | Resp 11 | Ht 64.0 in | Wt 201.0 lb

## 2020-01-03 DIAGNOSIS — K219 Gastro-esophageal reflux disease without esophagitis: Secondary | ICD-10-CM | POA: Diagnosis not present

## 2020-01-03 DIAGNOSIS — K449 Diaphragmatic hernia without obstruction or gangrene: Secondary | ICD-10-CM | POA: Diagnosis not present

## 2020-01-03 DIAGNOSIS — R1310 Dysphagia, unspecified: Secondary | ICD-10-CM | POA: Diagnosis not present

## 2020-01-03 DIAGNOSIS — K222 Esophageal obstruction: Secondary | ICD-10-CM | POA: Diagnosis not present

## 2020-01-03 DIAGNOSIS — R1319 Other dysphagia: Secondary | ICD-10-CM

## 2020-01-03 MED ORDER — SODIUM CHLORIDE 0.9 % IV SOLN: 500.0000 mL | Freq: Once | INTRAVENOUS | Status: DC

## 2020-01-03 MED ORDER — OMEPRAZOLE 40 MG PO CPDR: 40.0000 mg | DELAYED_RELEASE_CAPSULE | Freq: Two times a day (BID) | ORAL | 3 refills | Status: DC

## 2020-01-03 NOTE — Progress Notes (Signed)
pt tolerated well. VSS. awake and to recovery. Report given to RN. Oral bite block inserted and removed without trauma. 

## 2020-01-03 NOTE — Progress Notes (Signed)
Vs by AG.

## 2020-01-03 NOTE — Op Note (Signed)
[facility] Patient Name: Faith Santiago Procedure Date: 01/03/2020 10:30 AM MRN: [id number] Endoscopist: Docia Chuck. Henrene Pastor , MD Age: 79 Referring MD:  Date of Birth: Jul 09, 1940 Gender: Female Account #: [id number] Procedure:                Upper GI endoscopy with balloon dilation of the                            esophagus?"18 mm Indications:              Therapeutic procedure, intermittent solid food                            dysphagia. Known history of peptic stricture                            previously responding to therapeutic balloon                            dilation (most recent dilation September 2019, to                            18 mm max) Medicines:                Monitored Anesthesia Care Procedure:                Pre-Anesthesia Assessment:                           - Prior to the procedure, a History and Physical                            was performed, and patient medications and                            allergies were reviewed. The patient's tolerance of                            previous anesthesia was also reviewed. The risks                            and benefits of the procedure and the sedation                            options and risks were discussed with the patient.                            All questions were answered, and informed consent                            was obtained. Prior Anticoagulants: The patient has                            taken no previous anticoagulant or antiplatelet  agents. ASA Grade Assessment: II - A patient with                            mild systemic disease. After reviewing the risks                            and benefits, the patient was deemed in                            satisfactory condition to undergo the procedure.                           After obtaining informed consent, the endoscope was                            passed under direct vision. Throughout the                             procedure, the patient's blood pressure, pulse, and                            oxygen saturations were monitored continuously. The                            Endoscope was introduced through the mouth, and                            advanced to the second part of duodenum. The upper                            GI endoscopy was accomplished without difficulty.                            The patient tolerated the procedure well. Scope In: Scope Out: Findings:                 One benign-appearing, intrinsic moderate stenosis                            was found 35 cm from the incisors. This stenosis                            measured 1.4 cm (inner diameter). There was                            associated esophagitis. The stenosis was traversed.                            After completing the endoscopic survey, A TTS                            dilator was passed through the scope. Dilation with  an 18-19-20 mm balloon dilator was performed to 18                            mm. Stricture was disrupted (see images)                           The stomach was normal save a 5 cm sliding hiatal                            hernia.                           The examined duodenum was normal.                           The cardia and gastric fundus were normal on                            retroflexion. Complications:            No immediate complications. Estimated Blood Loss:     Estimated blood loss: none. Impression:               1. GERD complicated by esophagitis and peptic                            stricture status post dilation to 18 mm                           2. Moderate hiatal hernia. Otherwise normal EGD. Recommendation:           1. Patient has a contact number available for                            emergencies. The signs and symptoms of potential                            delayed complications were discussed with the                             patient. Return to normal activities tomorrow.                            Written discharge instructions were provided to the                            patient.                           2. Post dilation diet.                           3. Prescribe omeprazole 40 mg TWICE daily; #60; 11                            refills. We are increasing your acid suppressive  medication because active inflammation was present                            on today's examination.                           4. Office follow-up with Dr. Carlean Purl in 6 weeks. Docia Chuck. Henrene Pastor, MD 01/03/2020 10:51:33 AM This report has been signed electronically.

## 2020-01-03 NOTE — Progress Notes (Signed)
Called to room to assist during endoscopic procedure.  Patient ID and intended procedure confirmed with present staff. Received instructions for my participation in the procedure from the performing physician.  

## 2020-01-03 NOTE — Patient Instructions (Signed)
Please read all of the handouts given to you by your recovery room nurse.  Follow diet restrictions. Thank-you for choosing [place] for your healthcare needs today.  YOU HAD AN ENDOSCOPIC PROCEDURE TODAY AT [facility]:   Refer to the procedure report that was given to you for any specific questions about what was found during the examination.  If the procedure report does not answer your questions, please call your gastroenterologist to clarify.  If you requested that your care partner not be given the details of your procedure findings, then the procedure report has been included in a sealed envelope for you to review at your convenience later.  YOU SHOULD EXPECT: Some feelings of bloating in the abdomen. Passage of more gas than usual.  Walking can help get rid of the air that was put into your GI tract during the procedure and reduce the bloating.   Please Note:  You might notice some irritation and congestion in your nose or some drainage.  This is from the oxygen used during your procedure.  There is no need for concern and it should clear up in a day or so.  SYMPTOMS TO REPORT IMMEDIATELY:    Following upper endoscopy (EGD)  Vomiting of blood or coffee ground material  New chest pain or pain under the shoulder blades  Painful or persistently difficult swallowing  New shortness of breath  Fever of 100F or higher  Black, tarry-looking stools  For urgent or emergent issues, a gastroenterologist can be reached at any hour by calling [phone]. Do not use MyChart messaging for urgent concerns.    DIET:  We do recommend nothing by mouth until 11:30am.  Clear liquids until 12:30, and you may proceed to your regular diet tomorrow.  Drink plenty of fluids but you should avoid alcoholic beverages for 24 hours.  ACTIVITY:  You should plan to take it easy for the rest of today and you should NOT DRIVE or use heavy machinery until tomorrow (because of the sedation medicines used  during the test).    FOLLOW UP: Our staff will call the number listed on your records 48-72 hours following your procedure to check on you and address any questions or concerns that you may have regarding the information given to you following your procedure. If we do not reach you, we will leave a message.  We will attempt to reach you two times.  During this call, we will ask if you have developed any symptoms of COVID 19. If you develop any symptoms (ie: fever, flu-like symptoms, shortness of breath, cough etc.) before then, please call [phone].  If you test positive for Covid 19 in the 2 weeks post procedure, please call and report this information to [place].     SIGNATURES/CONFIDENTIALITY: You and/or your care partner have signed paperwork which will be entered into your electronic medical record.  These signatures attest to the fact that that the information above on your After Visit Summary has been reviewed and is understood.  Full responsibility of the confidentiality of this discharge information lies with you and/or your care-partner.

## 2020-01-04 ENCOUNTER — Encounter: Payer: Self-pay | Admitting: Family Medicine

## 2020-01-05 ENCOUNTER — Telehealth: Payer: Self-pay

## 2020-01-05 ENCOUNTER — Telehealth: Payer: Self-pay | Admitting: *Deleted

## 2020-01-05 NOTE — Telephone Encounter (Signed)
  Follow up Call-  Call back number 01/03/2020 12/19/2017 12/05/2017  Post procedure Call Back phone  # [phone]  Permission to leave phone message Yes Yes Yes  Some recent data might be hidden     No answer

## 2020-01-05 NOTE — Telephone Encounter (Signed)
  Follow up Call-  Call back number 01/03/2020 12/19/2017 12/05/2017  Post procedure Call Back phone  # [phone]  Permission to leave phone message Yes Yes Yes  Some recent data might be hidden     Patient questions:  Do you have a fever, pain , or abdominal swelling? No. Pain Score  0 *  Have you tolerated food without any problems? Yes.    Have you been able to return to your normal activities? Yes.    Do you have any questions about your discharge instructions: Diet   No. Medications  No. Follow up visit  No.  Do you have questions or concerns about your Care? No.  Actions: * If pain score is 4 or above: No action needed, pain <4.  1. Have you developed a fever since your procedure? no  2.   Have you had an respiratory symptoms (SOB or cough) since your procedure? no  3.   Have you tested positive for COVID 19 since your procedure no  4.   Have you had any family members/close contacts diagnosed with the COVID 19 since your procedure?  no   If yes to any of these questions please route to Joylene John, RN and Joella Prince, RN

## 2020-01-20 ENCOUNTER — Other Ambulatory Visit: Payer: Self-pay | Admitting: Family Medicine

## 2020-01-20 NOTE — Telephone Encounter (Signed)
E-scribed refill.  Plz schedule wellness, cpe and lab visits.  

## 2020-02-08 ENCOUNTER — Ambulatory Visit (INDEPENDENT_AMBULATORY_CARE_PROVIDER_SITE_OTHER): Payer: Medicare PPO | Admitting: Psychiatry

## 2020-02-08 ENCOUNTER — Encounter (HOSPITAL_COMMUNITY): Payer: Self-pay | Admitting: Psychiatry

## 2020-02-08 ENCOUNTER — Other Ambulatory Visit: Payer: Self-pay

## 2020-02-08 DIAGNOSIS — F419 Anxiety disorder, unspecified: Secondary | ICD-10-CM

## 2020-02-08 DIAGNOSIS — F3341 Major depressive disorder, recurrent, in partial remission: Secondary | ICD-10-CM | POA: Diagnosis not present

## 2020-02-08 MED ORDER — VENLAFAXINE HCL ER 75 MG PO CP24: ORAL_CAPSULE | ORAL | 0 refills | Status: DC

## 2020-02-08 MED ORDER — VENLAFAXINE HCL ER 150 MG PO CP24: ORAL_CAPSULE | ORAL | 0 refills | Status: DC

## 2020-02-08 MED ORDER — BUPROPION HCL ER (XL) 150 MG PO TB24: 150.0000 mg | ORAL_TABLET | Freq: Every morning | ORAL | 0 refills | Status: DC

## 2020-02-08 MED ORDER — ALPRAZOLAM 0.25 MG PO TABS: 0.2500 mg | ORAL_TABLET | Freq: Every evening | ORAL | 1 refills | Status: DC | PRN

## 2020-02-08 MED ORDER — ARIPIPRAZOLE 5 MG PO TABS: 5.0000 mg | ORAL_TABLET | Freq: Every day | ORAL | 0 refills | Status: DC

## 2020-02-08 NOTE — Progress Notes (Signed)
[facility] OP Progress Note   02/08/2020 10:54 AM Faith Santiago  MRN:  [id number]  Chief Complaint:  " I am doing better than before."   HPI: Patient presented with her husband.  Patient reported that she feels she is doing better after we reduce the dose of Abilify.  She stated that she feels she is more energetic and her depressed mood is also better. Her husband stated that he feels she is doing a little bit better and staying up more during the daytime however he is not seeing a very significant difference. He then added that she has a lot of stress going on because of her brothers being sick. Patient added that both her brothers live in [place] are not doing well.  One of her brother recently underwent amputation of one of his limbs and the other brother is on a ventilator.  She informed that the brother who is on a ventilator has a daughter and she and other family numbers probably will be taking him off the vent as the week progresses.  Her other sister who lives in [place] is flying into [place] and this way all the siblings will be there to make decisions. She stated that her brother was on the ventilator was the primary caregiver for the brother who is undergoing amputation and now that is going to put them in a binding situation because he will have no one to look after him. They are planning to drive to [place] end of this week.  Her husband stated that they have driven there in the past and they will be all right by road.  Visit Diagnosis:    ICD-10-CM   1. MDD (major depressive disorder), recurrent, in partial remission (HCC)  F33.41 ARIPiprazole (ABILIFY) 5 MG tablet    buPROPion (WELLBUTRIN XL) 150 MG 24 hr tablet    venlafaxine XR (EFFEXOR XR) 75 MG 24 hr capsule    venlafaxine XR (EFFEXOR-XR) 150 MG 24 hr capsule  2. Anxiety  F41.9 ALPRAZolam (XANAX) 0.25 MG tablet    Past Psychiatric History: Depression, anxiety  Past Medical History:  Past Medical History:  Diagnosis Date   . Anemia, unspecified   . Anxiety   . Arthritis    hands R>L, neck and lower back  . Bimalleolar fracture of left ankle 11/19/2013   [place]  . Cataract   . COVID-19 05/2019  . Depression   . Esophageal reflux   . GERD with stricture 06/03/2007  . Glaucoma   . History of osteopenia    DEXA WNL 2008, 2015  . History of uterine cancer 1999   s/p hysterectomy  . HLD (hyperlipidemia)   . Narcolepsy   . Thyroid disease   . Uterine cancer [facility]     Past Surgical History:  Procedure Laterality Date  . bilateral catarct removal  2010  . COLONOSCOPY  05/2003   WNL (Dr Velora Heckler)  . COLONOSCOPY  11/2017   3 small TA, diverticulisis, no f/u needed Carlean Purl)  . DEXA  03/2014   WNL T score +3.9  . ESOPHAGOGASTRODUODENOSCOPY  11/2017   dilated esoph stenosis, 8cm HH ([facility])  . ESOPHAGOGASTRODUODENOSCOPY  12/2017   repeat dilation of esoph stenosis, 5cm HH - rec stay on PPI Carlean Purl)  . ESOPHAGOGASTRODUODENOSCOPY  12/2019   GERD with esophagitis and peptic stricture s/p dilation to 26mm, mod sized HH - rec increase PPI to BID x 1 yr due to ongoing active inflammation on EGD Henrene Pastor)  . ORIF ANKLE FRACTURE  Left 11/19/2013   Procedure: LEFT ANKLE FRACTURE OPEN TREATMENT BIMALLEOLAR ANKLE INCLUDES INTERNAL FIXATION ;  Surgeon: Johnny Bridge, MD  . RETINAL DETACHMENT REPAIR W/ SCLERAL BUCKLE LE Right 03/21/2016  . TOTAL ABDOMINAL HYSTERECTOMY  1999   uterine cancer    Family Psychiatric History: depression- mom  Family History:  Family History  Problem Relation Age of Onset  . Hypothyroidism Mother   . Stroke Mother        ministroke  . Coronary artery disease Mother 21       s/p some heart surgery  . Prostate cancer Father   . Diabetes Neg Hx   . Colon cancer Neg Hx   . Esophageal cancer Neg Hx   . Pancreatic cancer Neg Hx   . Stomach cancer Neg Hx   . Liver disease Neg Hx   . Rectal cancer Neg Hx     Social History:  Social History   Socioeconomic History  . Marital  status: Married    Spouse name: Not on file  . Number of children: Not on file  . Years of education: Not on file  . Highest education level: Not on file  Occupational History  . Occupation: retired-occupational therapist  Tobacco Use  . Smoking status: Never Smoker  . Smokeless tobacco: Never Used  Vaping Use  . Vaping Use: Never used  Substance and Sexual Activity  . Alcohol use: Yes    Alcohol/week: 7.0 - 12.0 standard drinks    Types: 7 - 10 Glasses of wine per week    Comment: 2 drinks a week  . Drug use: No  . Sexual activity: Never  Other Topics Concern  . 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 Difficulty of Paying Living Expenses: Not on file  Food Insecurity:   . Worried About Charity fundraiser in the Last Year: Not on file  . Ran Out of Food in the Last Year: Not on file  Transportation Needs:   . Lack of Transportation (Medical): Not on file  . Lack of Transportation (Non-Medical): Not on file  Physical Activity:   . Days of Exercise per Week: Not on file  . Minutes of Exercise per Session: Not on file  Stress:   . Feeling of Stress : Not on file  Social Connections:   . Frequency of Communication with Friends and Family: Not on file  . Frequency of Social Gatherings with Friends and Family: Not on file  . Attends Religious Services: Not on file  . Active Member of Clubs or Organizations: Not on file  . Attends Archivist Meetings: Not on file  . Marital Status: Not on file    Allergies:  Allergies  Allergen Reactions  . Ciprofloxacin     REACTION: sun induced rash    Metabolic Disorder Labs: Lab Results   Component Value Date   HGBA1C 5.9 10/29/2018   No results found for: PROLACTIN Lab Results  Component Value Date   CHOL 288 (H) 11/02/2018   TRIG 316.0 (H) 11/02/2018   HDL 68.50 11/02/2018   CHOLHDL 4 11/02/2018  VLDL 63.2 (H) 11/02/2018   LDLCALC 146 (H) 08/06/2016   LDLCALC 157 (H) 03/17/2014   Lab Results  Component Value Date   TSH 1.73 10/29/2018   TSH 1.52 10/15/2017    Therapeutic Level Labs: No results found for: LITHIUM No results found for: VALPROATE No components found for:  CBMZ  Current Medications: Current Outpatient Medications  Medication Sig Dispense Refill  . ALPRAZolam (XANAX) 0.25 MG tablet Take 1 tablet (0.25 mg total) by mouth at bedtime as needed for anxiety. 30 tablet 1  . ARIPiprazole (ABILIFY) 5 MG tablet Take 1 tablet (5 mg total) by mouth daily. 90 tablet 0  . atorvastatin (LIPITOR) 20 MG tablet TAKE 1 TABLET BY MOUTH EVERY DAY 90 tablet 2  . buPROPion (WELLBUTRIN XL) 150 MG 24 hr tablet Take 1 tablet (150 mg total) by mouth in the morning. 90 tablet 0  . Cholecalciferol (VITAMIN D) 50 MCG (2000 UT) CAPS Take 1 capsule (2,000 Units total) by mouth daily. 30 capsule   . levothyroxine (SYNTHROID) 50 MCG tablet TAKE 1 TABLET (50 MCG TOTAL) BY MOUTH DAILY. 90 tablet 0  . omeprazole (PRILOSEC) 40 MG capsule Take 1 capsule (40 mg total) by mouth 2 (two) times daily before a meal. 180 capsule 3  . venlafaxine XR (EFFEXOR XR) 75 MG 24 hr capsule Take 1 capsule with 150 mg capsule daily 90 capsule 0  . venlafaxine XR (EFFEXOR-XR) 150 MG 24 hr capsule Take 1 capsule with 75 mg capsule daily (total dose 225 mg daily) 90 capsule 0   No current facility-administered medications for this visit.      Psychiatric Specialty Exam: ROS    Blood pressure 139/75, pulse (!) 101, height 5\' 4"  (1.626 m), weight 201 lb (91.2 kg), SpO2 98 %.Body mass index is 34.5 kg/m.  General Appearance:  Fairly groomed  Eye Contact:   Good  Speech:  Clear and Coherent and  Normal Rate  Volume:  Normal  Mood: Less  Depressed  Affect:  Congruent  Thought Process:  Goal Directed and Linear  Orientation:  Full (Time, Place, and Person)  Thought Content: Logical   Suicidal Thoughts:  No  Homicidal Thoughts:  No  Memory:  Recent;   Good Remote;   Good  Judgement:  Fair  Insight:  Fair  Psychomotor Activity:  normal  Concentration:  Concentration: Fair and Attention Span: Fair  Recall:  Good  Fund of Knowledge: Good  Language: Fair  Akathisia:  Negative  Handed:  Right  AIMS (if indicated): not done  Assets:  Communication Skills Desire for Improvement Financial Resources/Insurance Housing Social Support  ADL's:  Intact  Cognition: WNL  Sleep:  Fair   Screenings: GAD-7     Office Visit from 06/01/2015 in [place] at [place] from 10/15/2017 in [place] at [place] from 04/02/2016 in [place] at [facility]  Total Score (max 30 points ) 20 20    PHQ2-9     Office Visit from 11/02/2018 in [place] at [place] from 10/15/2017 in [place] at [place] from 08/13/2016 in [place] at [place] from 04/02/2016 in [place] at [place] from 06/01/2015 in [place] at [facility] Total Score 6 0 6 0 6  PHQ-9 Total Score 16 0 13 -- 25  Assessment and Plan: Patient appears to be doing slightly better compared to last visit.  She has the ongoing stressor of her siblings not doing well in [place].  She is planning to visit them soon.  1. MDD (major depressive disorder), recurrent, in partial remission (HCC)  - buPROPion (WELLBUTRIN XL) 150 MG 24 hr tablet; Take 1 tablet (150 mg total) by mouth in the morning.  Dispense: 90 tablet; Refill: 0 - venlafaxine XR (EFFEXOR XR) 75 MG 24 hr capsule; Take 1 capsule with 150 mg  capsule daily  Dispense: 90 capsule; Refill: 0 - venlafaxine XR (EFFEXOR-XR) 150 MG 24 hr capsule; Take 1 capsule with 75 mg capsule daily (total dose 225 mg daily)  Dispense: 90 capsule; Refill: 0 - ARIPiprazole (ABILIFY) 5 MG tablet; Take 1 tablet (5 mg total) by mouth daily.  Dispense: 90 tablet; Refill: 0  2. Anxiety  - ALPRAZolam (XANAX) 0.25 MG tablet; Take 1 tablet (0.25 mg total) by mouth at bedtime as needed for anxiety.  Dispense: 30 tablet; Refill: 1  Continue same medication regimen. Follow up in 2 months.   [place] Crane, MD 02/08/2020, 10:54 AM

## 2020-02-14 ENCOUNTER — Telehealth: Payer: Self-pay | Admitting: Family Medicine

## 2020-02-14 NOTE — Telephone Encounter (Signed)
Patient needs to set up cpe, mwv, and labs. Called and left vm for patient to call our office. NO details left on vm.

## 2020-02-17 ENCOUNTER — Ambulatory Visit: Payer: Medicare PPO | Admitting: Internal Medicine

## 2020-03-15 ENCOUNTER — Encounter: Payer: Self-pay | Admitting: Internal Medicine

## 2020-03-15 ENCOUNTER — Ambulatory Visit: Payer: Medicare PPO | Admitting: Internal Medicine

## 2020-03-15 VITALS — BP 120/80 | HR 82 | Ht 64.0 in | Wt 202.0 lb

## 2020-03-15 DIAGNOSIS — K219 Gastro-esophageal reflux disease without esophagitis: Secondary | ICD-10-CM | POA: Diagnosis not present

## 2020-03-15 DIAGNOSIS — K222 Esophageal obstruction: Secondary | ICD-10-CM | POA: Diagnosis not present

## 2020-03-15 NOTE — Patient Instructions (Signed)
I am glad your swallowing is better.  Keep doing what you are doing as far as cutting and chewing food.  As we discussed, if you start to have more problems with swallowing call back to the office and let me know and we will most likely plan to schedule a repeat endoscopy and dilation to open it up some more.  I appreciate the opportunity to care for you.  Gatha Mayer, MD, [facility]

## 2020-03-15 NOTE — Progress Notes (Signed)
Faith Santiago 79 y.o. 08/17/1940 [id number]  Assessment & Plan:   Encounter Diagnosis  Name Primary?  . GERD with stricture Yes    Continue twice daily PPI therapy careful chewing swallowing etc.  If she has an uptick in her dysphagia I think considering repeat EGD (office visit would not be needed) with dilation to 20 mm would be reasonable.  I appreciate the opportunity to care for this patient. CC: Faith Bush, MD   Subjective:   Chief Complaint: Follow-up of dysphagia after dilation of esophageal stricture  HPI 79 year old white woman with GERD and esophageal stricture problem status post EGD and dilation to 18 mm by Faith Santiago in late September after seeing me in early September with complaints of recurrent dysphagia.  She had active esophagitis and he increased her omeprazole to 40 mg twice daily.  Since that time she had one episode of dysphagia when she was at a [employer] in [place] where she had eaten 3 pieces of pot roast and a couple of carrots.  Other than that no dysphagia and she is pleased with the improvement. Allergies  Allergen Reactions  . Ciprofloxacin     REACTION: sun induced rash   Current Meds  Medication Sig  . ALPRAZolam (XANAX) 0.25 MG tablet Take 1 tablet (0.25 mg total) by mouth at bedtime as needed for anxiety.  . ARIPiprazole (ABILIFY) 5 MG tablet Take 1 tablet (5 mg total) by mouth daily.  Marland Kitchen atorvastatin (LIPITOR) 20 MG tablet TAKE 1 TABLET BY MOUTH EVERY DAY  . buPROPion (WELLBUTRIN XL) 150 MG 24 hr tablet Take 1 tablet (150 mg total) by mouth in the morning.  . Cholecalciferol (VITAMIN D) 50 MCG (2000 UT) CAPS Take 1 capsule (2,000 Units total) by mouth daily.  Marland Kitchen levothyroxine (SYNTHROID) 50 MCG tablet TAKE 1 TABLET (50 MCG TOTAL) BY MOUTH DAILY.  Marland Kitchen omeprazole (PRILOSEC) 40 MG capsule Take 1 capsule (40 mg total) by mouth 2 (two) times daily before a meal.  . venlafaxine XR (EFFEXOR XR) 75 MG 24 hr capsule Take 1 capsule with 150  mg capsule daily  . venlafaxine XR (EFFEXOR-XR) 150 MG 24 hr capsule Take 1 capsule with 75 mg capsule daily (total dose 225 mg daily)   Past Medical History:  Diagnosis Date  . Anemia, unspecified   . Anxiety   . Arthritis    hands R>L, neck and lower back  . Bimalleolar fracture of left ankle 11/19/2013   [place]  . Cataract   . COVID-19 05/2019  . Depression   . Esophageal reflux   . GERD with stricture 06/03/2007  . Glaucoma   . History of osteopenia    DEXA WNL 2008, 2015  . History of uterine cancer 1999   s/p hysterectomy  . HLD (hyperlipidemia)   . Narcolepsy   . Thyroid disease   . Uterine cancer [facility])    Past Surgical History:  Procedure Laterality Date  . bilateral catarct removal  2010  . COLONOSCOPY  05/2003   WNL (Dr Faith Santiago)  . COLONOSCOPY  11/2017   3 small TA, diverticulisis, no f/u needed Faith Santiago)  . DEXA  03/2014   WNL T score +3.9  . ESOPHAGOGASTRODUODENOSCOPY  11/2017   dilated esoph stenosis, 8cm HH (Faith Santiago)  . ESOPHAGOGASTRODUODENOSCOPY  12/2017   repeat dilation of esoph stenosis, 5cm HH - rec stay on PPI Faith Santiago)  . ESOPHAGOGASTRODUODENOSCOPY  12/2019   GERD with esophagitis and peptic stricture s/p dilation to 63mm, mod sized HH -  rec increase PPI to BID x 1 yr due to ongoing active inflammation on EGD Faith Santiago)  . ORIF ANKLE FRACTURE Left 11/19/2013   Procedure: LEFT ANKLE FRACTURE OPEN TREATMENT BIMALLEOLAR ANKLE INCLUDES INTERNAL FIXATION ;  Surgeon: Faith Bridge, MD  . RETINAL DETACHMENT REPAIR W/ SCLERAL BUCKLE LE Right 03/21/2016  . 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   Review of Systems As above  Objective:   Physical Exam BP 120/80   Pulse 82   Ht 5\' 4"  (1.626 m)   Wt 202 lb (91.6 kg)   BMI 34.67 kg/m

## 2020-04-03 ENCOUNTER — Other Ambulatory Visit: Payer: Self-pay

## 2020-04-03 ENCOUNTER — Ambulatory Visit (INDEPENDENT_AMBULATORY_CARE_PROVIDER_SITE_OTHER): Payer: Medicare PPO | Admitting: Psychiatry

## 2020-04-03 ENCOUNTER — Encounter (HOSPITAL_COMMUNITY): Payer: Self-pay | Admitting: Psychiatry

## 2020-04-03 DIAGNOSIS — F3341 Major depressive disorder, recurrent, in partial remission: Secondary | ICD-10-CM

## 2020-04-03 DIAGNOSIS — F4321 Adjustment disorder with depressed mood: Secondary | ICD-10-CM | POA: Insufficient documentation

## 2020-04-03 DIAGNOSIS — F419 Anxiety disorder, unspecified: Secondary | ICD-10-CM

## 2020-04-03 MED ORDER — VENLAFAXINE HCL ER 150 MG PO CP24: ORAL_CAPSULE | ORAL | 0 refills | Status: DC

## 2020-04-03 MED ORDER — VENLAFAXINE HCL ER 75 MG PO CP24: ORAL_CAPSULE | ORAL | 0 refills | Status: DC

## 2020-04-03 MED ORDER — BUPROPION HCL ER (XL) 150 MG PO TB24: 150.0000 mg | ORAL_TABLET | Freq: Every morning | ORAL | 0 refills | Status: DC

## 2020-04-03 MED ORDER — ARIPIPRAZOLE 5 MG PO TABS: 5.0000 mg | ORAL_TABLET | Freq: Every day | ORAL | 0 refills | Status: DC

## 2020-04-03 MED ORDER — ALPRAZOLAM 0.25 MG PO TABS: 0.2500 mg | ORAL_TABLET | Freq: Every evening | ORAL | 1 refills | Status: DC | PRN

## 2020-04-03 NOTE — Progress Notes (Signed)
[place] MD OP Progress Note   04/03/2020 11:55 AM Faith Santiago  MRN:  [id number]  Chief Complaint:  " I have not had a chance to grieve."   HPI: Patient seen alone today.  She informed that one of her brothers in [place] passed away.  The other brother underwent some procedure in the hospital and hopefully will be able to return back home in a few days.  She stated that after her mother passed away they did not get a chance to grieve.  She stated that she and her husband along with her sister just worked on cleaning out the house so that the brother who was in the hospital can return home and have a safe place to move around on a wheelchair. She stated that after the left her brother has no family left in [place] to take care of him.  He can still drive though because his right foot is okay, his left foot was amputated.  He has some friends that live across the street for willing to help some. Patient stated that she and the family have no time to think about anything else and she thinks that the grief will kick in soon. She stated that her medicines seem to be helpful.  She stated that her children and her PCP strongly believe that she should be talking to a counselor.  She stated that she saw someone in the recent past but could not connect with them.  She asked if she can be referred to someone that the writer knows.  Writer informed her that referral will be sent to our in-house therapist at [place] office.  Patient was agreeable to that.  Visit Diagnosis:    ICD-10-CM   1. MDD (major depressive disorder), recurrent, in partial remission (HCC)  F33.41 ARIPiprazole (ABILIFY) 5 MG tablet    buPROPion (WELLBUTRIN XL) 150 MG 24 hr tablet    venlafaxine XR (EFFEXOR XR) 75 MG 24 hr capsule    venlafaxine XR (EFFEXOR-XR) 150 MG 24 hr capsule  2. Anxiety  F41.9 ALPRAZolam (XANAX) 0.25 MG tablet  3. Grief  F43.21     Past Psychiatric History: Depression, anxiety  Past Medical History:  Past  Medical History:  Diagnosis Date  . Anemia, unspecified   . Anxiety   . Arthritis    hands R>L, neck and lower back  . Bimalleolar fracture of left ankle 11/19/2013   [place]  . Cataract   . COVID-19 05/2019  . Depression   . Esophageal reflux   . GERD with stricture 06/03/2007  . Glaucoma   . History of osteopenia    DEXA WNL 2008, 2015  . History of uterine cancer 1999   s/p hysterectomy  . HLD (hyperlipidemia)   . Narcolepsy   . Thyroid disease   . Uterine cancer [facility])     Past Surgical History:  Procedure Laterality Date  . bilateral catarct removal  2010  . COLONOSCOPY  05/2003   WNL (Dr Velora Heckler)  . COLONOSCOPY  11/2017   3 small TA, diverticulisis, no f/u needed Carlean Purl)  . DEXA  03/2014   WNL T score +3.9  . ESOPHAGOGASTRODUODENOSCOPY  11/2017   dilated esoph stenosis, 8cm HH (Gessner)  . ESOPHAGOGASTRODUODENOSCOPY  12/2017   repeat dilation of esoph stenosis, 5cm HH - rec stay on PPI Carlean Purl)  . ESOPHAGOGASTRODUODENOSCOPY  12/2019   GERD with esophagitis and peptic stricture s/p dilation to 48mm, mod sized HH - rec increase PPI to BID x 1  yr due to ongoing active inflammation on EGD Henrene Pastor)  . ORIF ANKLE FRACTURE Left 11/19/2013   Procedure: LEFT ANKLE FRACTURE OPEN TREATMENT BIMALLEOLAR ANKLE INCLUDES INTERNAL FIXATION ;  Surgeon: Johnny Bridge, MD  . RETINAL DETACHMENT REPAIR W/ SCLERAL BUCKLE LE Right 03/21/2016  . TOTAL ABDOMINAL HYSTERECTOMY  1999   uterine cancer    Family Psychiatric History: depression- mom  Family History:  Family History  Problem Relation Age of Onset  . Hypothyroidism Mother   . Stroke Mother        ministroke  . Coronary artery disease Mother 12       s/p some heart surgery  . Prostate cancer Father   . Diabetes Neg Hx   . Colon cancer Neg Hx   . Esophageal cancer Neg Hx   . Pancreatic cancer Neg Hx   . Stomach cancer Neg Hx   . Liver disease Neg Hx   . Rectal cancer Neg Hx     Social History:  Social History    Socioeconomic History  . Marital status: Married    Spouse name: Not on file  . Number of children: 2  . Years of education: Not on file  . Highest education level: Not on file  Occupational History  . Occupation: retired-occupational therapist  Tobacco Use  . Smoking status: Never Smoker  . Smokeless tobacco: Never Used  Vaping Use  . Vaping Use: Never used  Substance and Sexual Activity  . Alcohol use: Yes    Alcohol/week: 7.0 - 12.0 standard drinks    Types: 7 - 10 Glasses of wine per week    Comment: 2 drinks a week  . Drug use: No  . Sexual activity: Not Currently  Other Topics Concern  . 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 Ciprofloxacin     REACTION: sun induced rash    Metabolic Disorder Labs: Lab Results  Component Value Date   HGBA1C 5.9 10/29/2018   No results found for: PROLACTIN Lab Results  Component Value Date   CHOL 288 (H) 11/02/2018   TRIG 316.0 (H) 11/02/2018   HDL 68.50 11/02/2018   CHOLHDL 4 11/02/2018   VLDL 63.2 (H) 11/02/2018   LDLCALC 146 (H) 08/06/2016   LDLCALC 157 (H) 03/17/2014   Lab Results  Component Value Date   TSH 1.73 10/29/2018   TSH 1.52 10/15/2017    Therapeutic Level Labs: No results found for: LITHIUM No results found for: VALPROATE No components found for:  CBMZ  Current Medications: Current Outpatient Medications  Medication Sig Dispense Refill  . ALPRAZolam (XANAX)  0.25 MG tablet Take 1 tablet (0.25 mg total) by mouth at bedtime as needed for anxiety. 30 tablet 1  . ARIPiprazole (ABILIFY) 5 MG tablet Take 1 tablet (5 mg total) by mouth daily. 90 tablet 0  . atorvastatin (LIPITOR) 20 MG tablet TAKE 1 TABLET BY  MOUTH EVERY DAY 90 tablet 2  . buPROPion (WELLBUTRIN XL) 150 MG 24 hr tablet Take 1 tablet (150 mg total) by mouth in the morning. 90 tablet 0  . Cholecalciferol (VITAMIN D) 50 MCG (2000 UT) CAPS Take 1 capsule (2,000 Units total) by mouth daily. 30 capsule   . levothyroxine (SYNTHROID) 50 MCG tablet TAKE 1 TABLET (50 MCG TOTAL) BY MOUTH DAILY. 90 tablet 0  . omeprazole (PRILOSEC) 40 MG capsule Take 1 capsule (40 mg total) by mouth 2 (two) times daily before a meal. 180 capsule 3  . venlafaxine XR (EFFEXOR XR) 75 MG 24 hr capsule Take 1 capsule with 150 mg capsule daily 90 capsule 0  . venlafaxine XR (EFFEXOR-XR) 150 MG 24 hr capsule Take 1 capsule with 75 mg capsule daily (total dose 225 mg daily) 90 capsule 0   No current facility-administered medications for this visit.      Psychiatric Specialty Exam: ROS    There were no vitals taken for this visit.There is no height or weight on file to calculate BMI.  General Appearance:  Fairly groomed  Eye Contact:   Good  Speech:  Clear and Coherent and Normal Rate  Volume:  Normal  Mood: Less  Depressed  Affect:  Congruent  Thought Process:  Goal Directed and Linear  Orientation:  Full (Time, Place, and Person)  Thought Content: Logical   Suicidal Thoughts:  No  Homicidal Thoughts:  No  Memory:  Recent;   Good Remote;   Good  Judgement:  Fair  Insight:  Fair  Psychomotor Activity:  normal  Concentration:  Concentration: Fair and Attention Span: Fair  Recall:  Good  Fund of Knowledge: Good  Language: Fair  Akathisia:  Negative  Handed:  Right  AIMS (if indicated): not done  Assets:  Communication Skills Desire for Improvement Financial Resources/Insurance Housing Social Support   ADL's:  Intact  Cognition: WNL  Sleep:  Fair   Screenings: GAD-7   [place] Office Visit from 06/01/2015 in [place] at [place] from 10/15/2017 in [place] at [place] from 04/02/2016 in [place] at [facility]  Total Score (max 30 points ) 20 20    PHQ2-9   [place] Visit from 11/02/2018 in [place] at [place] from 10/15/2017 in [place] at [place] from 08/13/2016 in [place] at [place] from 04/02/2016 in [place] at [place] from 06/01/2015 in [place] at [facility] Total Score 6 0 6 0 6  PHQ-9 Total Score 16 0 13 -- 25       Assessment and Plan: Patient recently lost her brother, is undergoing early stages of grief.  She is interested in seeing a therapist.   1. MDD (major depressive disorder), recurrent, in partial remission ([place])  - buPROPion (WELLBUTRIN XL) 150 MG 24 hr tablet; Take 1 tablet (150 mg total) by mouth in the morning.  Dispense: 90 tablet; Refill: 0 - venlafaxine XR (EFFEXOR XR) 75 MG 24 hr capsule; Take 1 capsule with 150 mg capsule daily  Dispense: 90 capsule; Refill: 0 - venlafaxine XR (EFFEXOR-XR) 150 MG 24 hr capsule; Take 1 capsule with 75 mg capsule daily (total dose 225 mg daily)  Dispense: 90 capsule; Refill: 0 - ARIPiprazole (ABILIFY) 5 MG tablet; Take 1 tablet (5 mg total) by  mouth daily.  Dispense: 90 tablet; Refill: 0  2. Anxiety  - ALPRAZolam (XANAX) 0.25 MG tablet; Take 1 tablet (0.25 mg total) by mouth at bedtime as needed for anxiety.  Dispense: 30 tablet; Refill: 1  3. Grief  Will refer her to a therapist for individual counseling. Continue same medication regimen. Follow up in 2 months.   [place] Crane, MD 04/03/2020, 11:55 AM

## 2020-04-14 ENCOUNTER — Other Ambulatory Visit: Payer: Self-pay | Admitting: Family Medicine

## 2020-04-17 ENCOUNTER — Other Ambulatory Visit: Payer: Self-pay | Admitting: Family Medicine

## 2020-04-25 ENCOUNTER — Ambulatory Visit: Payer: Medicare PPO | Admitting: Licensed Clinical Social Worker

## 2020-05-08 ENCOUNTER — Ambulatory Visit: Payer: Medicare PPO | Admitting: Licensed Clinical Social Worker

## 2020-05-10 ENCOUNTER — Ambulatory Visit (INDEPENDENT_AMBULATORY_CARE_PROVIDER_SITE_OTHER): Payer: Medicare PPO | Admitting: Licensed Clinical Social Worker

## 2020-05-10 ENCOUNTER — Other Ambulatory Visit: Payer: Self-pay

## 2020-05-10 DIAGNOSIS — F419 Anxiety disorder, unspecified: Secondary | ICD-10-CM

## 2020-05-10 DIAGNOSIS — F3341 Major depressive disorder, recurrent, in partial remission: Secondary | ICD-10-CM | POA: Diagnosis not present

## 2020-05-10 NOTE — Progress Notes (Signed)
Virtual Visit via Video Note  I connected with MILLENA CALLINS on 05/10/20 at  2:00 PM EST by a video enabled telemedicine application and verified that I am speaking with the correct person using two identifiers.  Location: Patient: home Provider: remote office [place], [place])   I discussed the limitations of evaluation and management by telemedicine and the availability of in person appointments. The patient expressed understanding and agreed to proceed.   I discussed the assessment and treatment plan with the patient. The patient was provided an opportunity to ask questions and all were answered. The patient agreed with the plan and demonstrated an understanding of the instructions.   The patient was advised to call back or seek an in-person evaluation if the symptoms worsen or if the condition fails to improve as anticipated.  I provided 60 minutes of non-face-to-face time during this encounter.   Alee Katen R Birda Didonato, LCSW    THERAPIST PROGRESS NOTE  Session Time: 2-3pm  Participation Level: Active  Behavioral Response: Neat and Well GroomedAlertAnxious and Depressed  Type of Therapy: Individual Therapy  Treatment Goals addressed: Anxiety and Coping  Interventions: Solution Focused and Supportive  Summary: COURTNEY BELLIZZI is a 80 y.o. female who presents with symptoms associated with depression and anxiety. Pt reports that she is having very low motivation and initiative, and that it is hard for her to enjoy tasks and activities that were once enjoyable to her. Pt currently taking effexor and wellbutrin recently added.   Pt reports that overall mood has been low recently. Pt feels her children are not happy with the state of how her home is "They think I'm a hoarder".  Discussed emotional attachment to personal items. Allowed pt to explore and express thoughts and feelings attached to the situation of when children came in and started removing objects from the home.    Allowed pt to explore history of depression and anxiety--had pt explore treatments and counseling in the past.   Suicidal/Homicidal: No  Therapist Response: Partial CCA, GAD7, and PHQ9 completed. Will finish at next session. Pt very engaged and cooperative throughout session.   Plan: Return again in 3 weeks.  Diagnosis: Axis I: Generalized Anxiety Disorder and Major Depression, Recurrent in partial remission    Axis II: No diagnosis    Rachel Bo Janeka Libman, LCSW 05/10/2020

## 2020-05-29 ENCOUNTER — Other Ambulatory Visit: Payer: Self-pay

## 2020-05-29 ENCOUNTER — Ambulatory Visit (HOSPITAL_COMMUNITY): Payer: Medicare PPO | Admitting: Psychiatry

## 2020-05-29 ENCOUNTER — Ambulatory Visit (INDEPENDENT_AMBULATORY_CARE_PROVIDER_SITE_OTHER): Payer: Medicare PPO | Admitting: Licensed Clinical Social Worker

## 2020-05-29 DIAGNOSIS — F4321 Adjustment disorder with depressed mood: Secondary | ICD-10-CM

## 2020-05-29 DIAGNOSIS — F3341 Major depressive disorder, recurrent, in partial remission: Secondary | ICD-10-CM

## 2020-05-29 DIAGNOSIS — F419 Anxiety disorder, unspecified: Secondary | ICD-10-CM

## 2020-05-29 NOTE — Progress Notes (Signed)
Virtual Visit via Video Note  I connected with Faith Santiago on 05/29/20 at  1:30 PM EST by a video enabled telemedicine application and verified that I am speaking with the correct person using two identifiers.  Location: Patient: home Provider: [facility]   I discussed the limitations of evaluation and management by telemedicine and the availability of in person appointments. The patient expressed understanding and agreed to proceed.  I discussed the assessment and treatment plan with the patient. The patient was provided an opportunity to ask questions and all were answered. The patient agreed with the plan and demonstrated an understanding of the instructions.   The patient was advised to call back or seek an in-person evaluation if the symptoms worsen or if the condition fails to improve as anticipated.  I provided 30 minutes of non-face-to-face time during this encounter.   Faith Mcdade R Kewon Statler, LCSW    THERAPIST PROGRESS NOTE  Session Time: 1:30-2:00p  Participation Level: Active  Behavioral Response: NAAlertAnxious  Type of Therapy: Individual Therapy  Treatment Goals addressed: coping skills--depression management  Interventions: CBT, DBT and Supportive  Summary: Faith Santiago is a 80 y.o. female who presents with symptoms consistent with depression. Pt reports low energy, low initiative, and low motivation to change.  Pt reports that her family has been "onto her" about being physically active and about doing more things around the house. Discussed projects that pt would like to complete and discussed how to break it down into more manageable pieces to feel more successful. Pt agrees and is willing to try. Encouraged pt to just write one task on "to do" list daily just so she can complete and when looking back--feel accomplished.   Encouraged continuing self care, life balance, and social engagement.   Suicidal/Homicidal: No  Therapist Response: Completed CCA. Faith Santiago  reports that she is not being intentional about incorporating physical activity, making task lists, or focusing on balance since last session. Reviewed coping mechanisms/strategies that pt can employ to help increase energy and re-engagement with life. Current progress fluctuating/intermittent. Treatment to continue as indicated  Plan: Return again in 4 weeks.  Diagnosis: Axis I: Bereavement, Generalized Anxiety Disorder and Major Depression, Recurrent severe    Axis II: No diagnosis    Faith Bo [name], LCSW 05/29/2020

## 2020-05-29 NOTE — Progress Notes (Signed)
See above note

## 2020-06-07 ENCOUNTER — Encounter (HOSPITAL_COMMUNITY): Payer: Self-pay | Admitting: Psychiatry

## 2020-06-07 ENCOUNTER — Other Ambulatory Visit: Payer: Self-pay

## 2020-06-07 ENCOUNTER — Ambulatory Visit (INDEPENDENT_AMBULATORY_CARE_PROVIDER_SITE_OTHER): Payer: Medicare PPO | Admitting: Psychiatry

## 2020-06-07 DIAGNOSIS — F419 Anxiety disorder, unspecified: Secondary | ICD-10-CM

## 2020-06-07 DIAGNOSIS — F3342 Major depressive disorder, recurrent, in full remission: Secondary | ICD-10-CM

## 2020-06-07 MED ORDER — VENLAFAXINE HCL ER 75 MG PO CP24: ORAL_CAPSULE | ORAL | 0 refills | Status: DC

## 2020-06-07 MED ORDER — BUPROPION HCL ER (XL) 150 MG PO TB24: 150.0000 mg | ORAL_TABLET | Freq: Every morning | ORAL | 0 refills | Status: DC

## 2020-06-07 MED ORDER — ALPRAZOLAM 0.25 MG PO TABS: 0.2500 mg | ORAL_TABLET | Freq: Every evening | ORAL | 1 refills | Status: DC | PRN

## 2020-06-07 MED ORDER — VENLAFAXINE HCL ER 150 MG PO CP24: ORAL_CAPSULE | ORAL | 0 refills | Status: DC

## 2020-06-07 MED ORDER — ARIPIPRAZOLE 5 MG PO TABS: 5.0000 mg | ORAL_TABLET | Freq: Every day | ORAL | 0 refills | Status: DC

## 2020-06-07 NOTE — Progress Notes (Signed)
[place] MD OP Progress Note   06/07/2020 1:41 PM Faith Santiago  MRN:  [id number]  Chief Complaint:  " I am doing well."   HPI: Pt presented by herself.  She informed that things going well for her.  She happily informed that she recently celebrated her 58th wedding anniversary with her husband.  She stated that overall she feels her mood is stable and she is in a better place now. She sometimes has some anxiety but overall it is manageable now.  She is sleeping well at night. She has been able to get out of her bed and take care of her chores and responsibilities around the house. She denied any new concerns or issues today. She agreed with the plan of continuing the same regimen for now. She was connected with a therapist and she stated that she really likes talking to them and would keep continuing to see them.   Visit Diagnosis:    ICD-10-CM   1. MDD (major depressive disorder), recurrent, in full remission ([place])  F33.42 ARIPiprazole (ABILIFY) 5 MG tablet    buPROPion (WELLBUTRIN XL) 150 MG 24 hr tablet    venlafaxine XR (EFFEXOR XR) 75 MG 24 hr capsule    venlafaxine XR (EFFEXOR-XR) 150 MG 24 hr capsule  2. Anxiety  F41.9 ALPRAZolam (XANAX) 0.25 MG tablet    Past Psychiatric History: Depression, anxiety  Past Medical History:  Past Medical History:  Diagnosis Date  . Anemia, unspecified   . Anxiety   . Arthritis    hands R>L, neck and lower back  . Bimalleolar fracture of left ankle 11/19/2013   [place]  . Cataract   . COVID-19 05/2019  . Depression   . Esophageal reflux   . GERD with stricture 06/03/2007  . Glaucoma   . History of osteopenia    DEXA WNL 2008, 2015  . History of uterine cancer 1999   s/p hysterectomy  . HLD (hyperlipidemia)   . Narcolepsy   . Thyroid disease   . Uterine cancer [facility])     Past Surgical History:  Procedure Laterality Date  . bilateral catarct removal  2010  . COLONOSCOPY  05/2003   WNL (Dr Velora Heckler)  . COLONOSCOPY  11/2017   3 small  TA, diverticulisis, no f/u needed Carlean Purl)  . DEXA  03/2014   WNL T score +3.9  . ESOPHAGOGASTRODUODENOSCOPY  11/2017   dilated esoph stenosis, 8cm HH ([facility]ner)  . ESOPHAGOGASTRODUODENOSCOPY  12/2017   repeat dilation of esoph stenosis, 5cm HH - rec stay on PPI Carlean Purl)  . ESOPHAGOGASTRODUODENOSCOPY  12/2019   GERD with esophagitis and peptic stricture s/p dilation to 77mm, mod sized HH - rec increase PPI to BID x 1 yr due to ongoing active inflammation on EGD Henrene Pastor)  . ORIF ANKLE FRACTURE Left 11/19/2013   Procedure: LEFT ANKLE FRACTURE OPEN TREATMENT BIMALLEOLAR ANKLE INCLUDES INTERNAL FIXATION ;  Surgeon: Johnny Bridge, MD  . RETINAL DETACHMENT REPAIR W/ SCLERAL BUCKLE LE Right 03/21/2016  . TOTAL ABDOMINAL HYSTERECTOMY  1999   uterine cancer    Family Psychiatric History: depression- mom  Family History:  Family History  Problem Relation Age of Onset  . Hypothyroidism Mother   . Stroke Mother        ministroke  . Coronary artery disease Mother 78       s/p some heart surgery  . Prostate cancer Father   . Diabetes Neg Hx   . Colon cancer Neg Hx   . Esophageal cancer  Neg Hx   . Pancreatic cancer Neg Hx   . Stomach cancer Neg Hx   . Liver disease Neg Hx   . Rectal cancer Neg Hx     Social History:  Social History   Socioeconomic History  . Marital status: Married    Spouse name: Not on file  . Number of children: 2  . Years of education: Not on file  . Highest education level: Not on file  Occupational History  . Occupation: retired-occupational therapist  Tobacco Use  . Smoking status: Never Smoker  . Smokeless tobacco: Never Used  Vaping Use  . Vaping Use: Never used  Substance and Sexual Activity  . Alcohol use: Yes    Alcohol/week: 7.0 - 12.0 standard drinks    Types: 7 - 10 Glasses of wine per week    Comment: 2 drinks a week  . Drug use: No  . Sexual activity: Not Currently  Other Topics Concern  . Not on file  Social History Narrative    caffeine: none   Lives with husband, has 2 grown children, 1 cat   Occupation: retired, was OT for [employer]   Activity: no regular exercise, to start silver sneakers   Diet: overeating, good amt water, vegetables daily, occasional fruits   Never smoker   EtOH 1/day   Social Determinants of Health   Financial Resource Strain: Not on file  Food Insecurity: Not on file  Transportation Needs: Not on file  Physical Activity: Not on file  Stress: Not on file  Social Connections: Not on file    Allergies:  Allergies  Allergen Reactions  . Ciprofloxacin     REACTION: sun induced rash    Metabolic Disorder Labs: Lab Results  Component Value Date   HGBA1C 5.9 10/29/2018   No results found for: PROLACTIN Lab Results  Component Value Date   CHOL 288 (H) 11/02/2018   TRIG 316.0 (H) 11/02/2018   HDL 68.50 11/02/2018   CHOLHDL 4 11/02/2018   VLDL 63.2 (H) 11/02/2018   LDLCALC 146 (H) 08/06/2016   LDLCALC 157 (H) 03/17/2014   Lab Results  Component Value Date   TSH 1.73 10/29/2018   TSH 1.52 10/15/2017    Therapeutic Level Labs: No results found for: LITHIUM No results found for: VALPROATE No components found for:  CBMZ  Current Medications: Current Outpatient Medications  Medication Sig Dispense Refill  . ALPRAZolam (XANAX) 0.25 MG tablet Take 1 tablet (0.25 mg total) by mouth at bedtime as needed for anxiety. 30 tablet 1  . ARIPiprazole (ABILIFY) 5 MG tablet Take 1 tablet (5 mg total) by mouth daily. 90 tablet 0  . atorvastatin (LIPITOR) 20 MG tablet TAKE 1 TABLET BY MOUTH EVERY DAY 90 tablet 2  . buPROPion (WELLBUTRIN XL) 150 MG 24 hr tablet Take 1 tablet (150 mg total) by mouth in the morning. 90 tablet 0  . Cholecalciferol (VITAMIN D) 50 MCG (2000 UT) CAPS Take 1 capsule (2,000 Units total) by mouth daily. 30 capsule   . levothyroxine (SYNTHROID) 50 MCG tablet TAKE 1 TABLET (50 MCG TOTAL) BY MOUTH DAILY. 90 tablet 0  . omeprazole (PRILOSEC) 40 MG capsule Take 1 capsule  (40 mg total) by mouth 2 (two) times daily before a meal. 180 capsule 3  . venlafaxine XR (EFFEXOR XR) 75 MG 24 hr capsule Take 1 capsule with 150 mg capsule daily 90 capsule 0  . venlafaxine XR (EFFEXOR-XR) 150 MG 24 hr capsule Take 1 capsule with 75 mg capsule  daily (total dose 225 mg daily) 90 capsule 0   No current facility-administered medications for this visit.      Psychiatric Specialty Exam: ROS    There were no vitals taken for this visit.There is no height or weight on file to calculate BMI.  General Appearance: Well-groomed  Eye Contact:   Good  Speech:  Clear and Coherent and Normal Rate  Volume:  Normal  Mood:  Euthymic  Affect:  Congruent  Thought Process:  Goal Directed and Linear  Orientation:  Full (Time, Place, and Person)  Thought Content: Logical   Suicidal Thoughts:  No  Homicidal Thoughts:  No  Memory:  Recent;   Good Remote;   Good  Judgement:  Fair  Insight:  Fair  Psychomotor Activity:  normal  Concentration:  Concentration: Fair and Attention Span: Fair  Recall:  Good  Fund of Knowledge: Good  Language: Fair  Akathisia:  Negative  Handed:  Right  AIMS (if indicated): not done  Assets:  Communication Skills Desire for Improvement Financial Resources/Insurance Housing Social Support  ADL's:  Intact  Cognition: WNL  Sleep:  Good   Screenings: GAD-7   Health and safety inspector from 05/10/2020 in [place] Visit from 06/01/2015 in [place] at [facility]  Total GAD-7 Score 16 [place] from 10/15/2017 in [place] at [place] from 04/02/2016 in [place] at [facility]  Total Score (max 30 points ) 20 20    PHQ2-9   [place] from 05/10/2020 in [place] Office Visit from 11/02/2018 in [place] at [place] from 10/15/2017 in [place] at  [place] from 08/13/2016 in [place] at [place] from 04/02/2016 in [place] at [facility] Total Score 6 6 0 6 0  PHQ-9 Total Score 13 16 0 13 -       Assessment and Plan: Patient appears to be doing better today.  We'll continue the same regimen for now  1. MDD (major depressive disorder), recurrent, in partial remission (HCC)  - buPROPion (WELLBUTRIN XL) 150 MG 24 hr tablet; Take 1 tablet (150 mg total) by mouth in the morning.  Dispense: 90 tablet; Refill: 0 - venlafaxine XR (EFFEXOR XR) 75 MG 24 hr capsule; Take 1 capsule with 150 mg capsule daily  Dispense: 90 capsule; Refill: 0 - venlafaxine XR (EFFEXOR-XR) 150 MG 24 hr capsule; Take 1 capsule with 75 mg capsule daily (total dose 225 mg daily)  Dispense: 90 capsule; Refill: 0 - ARIPiprazole (ABILIFY) 5 MG tablet; Take 1 tablet (5 mg total) by mouth daily.  Dispense: 90 tablet; Refill: 0  2. Anxiety  - ALPRAZolam (XANAX) 0.25 MG tablet; Take 1 tablet (0.25 mg total) by mouth at bedtime as needed for anxiety.  Dispense: 30 tablet; Refill: 1  3. Grief  Has started seeing Ms. Christina for therapy. Continue same medication regimen. Follow up in 10 weeks.   [place] Crane, MD 06/07/2020, 1:41 PM

## 2020-06-29 ENCOUNTER — Ambulatory Visit (INDEPENDENT_AMBULATORY_CARE_PROVIDER_SITE_OTHER): Payer: Medicare PPO | Admitting: Licensed Clinical Social Worker

## 2020-06-29 ENCOUNTER — Other Ambulatory Visit: Payer: Self-pay

## 2020-06-29 DIAGNOSIS — F3341 Major depressive disorder, recurrent, in partial remission: Secondary | ICD-10-CM

## 2020-06-29 NOTE — Progress Notes (Signed)
Virtual Visit via Video Note  I connected with Faith Santiago on 06/29/20 at  1:00 PM EDT by a video enabled telemedicine application and verified that I am speaking with the correct person using two identifiers.  Location: Patient: home Provider: [facility]   I discussed the limitations of evaluation and management by telemedicine and the availability of in person appointments. The patient expressed understanding and agreed to proceed.   I discussed the assessment and treatment plan with the patient. The patient was provided an opportunity to ask questions and all were answered. The patient agreed with the plan and demonstrated an understanding of the instructions.   The patient was advised to call back or seek an in-person evaluation if the symptoms worsen or if the condition fails to improve as anticipated.  I provided 30 minutes of non-face-to-face time during this encounter.   Quashaun Lazalde R Kiante Petrovich, LCSW    THERAPIST PROGRESS NOTE  Session Time: 1:15-1:45p  Participation Level: Active  Behavioral Response: NAAlertDepressed  Type of Therapy: Individual Therapy  Treatment Goals addressed: Coping  Interventions: CBT and Strength-based  Summary: Faith Santiago is a 80 y.o. female who presents with symptoms consistent with continuing depression. Pt reports that mood is up and down throughout day. Pt reporting good quality and quantity of sleep.  Allowed pt to explore and express thoughts and feelings associated with the household tasks that pt would like to accomplish. Used problem-solving skills to break down projects.   Continued recommendations are as follows: self care behaviors, positive social engagements, focusing on overall work/home/life balance, and focusing on positive physical and emotional wellness.    Suicidal/Homicidal: No  Therapist Response: Florene is being intentional about engaging in recreational activities that reflect increased energy and interest. Wealthy is  being an active participant in social/hobby groups. Jaia is actively trying to problem solve and is breaking down larger projects in to smaller, more manageable projects. This is reflective of personal growth and progress. Treatment to continue.   Plan: Return again in 4 weeks.  Diagnosis: Axis I: Major Depression, Recurrent in partial remission    Axis II: No diagnosis    [place], LCSW 06/29/2020

## 2020-07-19 ENCOUNTER — Other Ambulatory Visit: Payer: Self-pay | Admitting: Family Medicine

## 2020-07-19 NOTE — Telephone Encounter (Signed)
Pharmacy requests refill on: Levothyroxine 50 mcg   LAST REFILL: 04/18/2020 (Q-90, R-0) LAST OV: 11/02/2018 NEXT OV: Not Scheduled  PHARMACY: [id number] [place], [place]  TSH (10/29/2018): 1.73

## 2020-07-24 ENCOUNTER — Other Ambulatory Visit: Payer: Self-pay

## 2020-07-24 ENCOUNTER — Ambulatory Visit (INDEPENDENT_AMBULATORY_CARE_PROVIDER_SITE_OTHER): Payer: Medicare PPO | Admitting: Licensed Clinical Social Worker

## 2020-07-24 DIAGNOSIS — F419 Anxiety disorder, unspecified: Secondary | ICD-10-CM

## 2020-07-24 DIAGNOSIS — F3341 Major depressive disorder, recurrent, in partial remission: Secondary | ICD-10-CM | POA: Diagnosis not present

## 2020-07-24 NOTE — Progress Notes (Addendum)
Virtual Visit via Audio Note  I connected with Faith Santiago on 07/24/20 at  1:00 PM EDT by an audio enabled telemedicine application and verified that I am speaking with the correct person using two identifiers.  Location: Patient: home Provider: remote office [place], [place])   I discussed the limitations of evaluation and management by telemedicine and the availability of in person appointments. The patient expressed understanding and agreed to proceed.  I discussed the assessment and treatment plan with the patient. The patient was provided an opportunity to ask questions and all were answered. The patient agreed with the plan and demonstrated an understanding of the instructions.   The patient was advised to call back or seek an in-person evaluation if the symptoms worsen or if the condition fails to improve as anticipated.  I provided 30 minutes of non-face-to-face time during this encounter.   Faith Santiago Faith Rochel, LCSW    THERAPIST PROGRESS NOTE  Session Time: 1-1:30p  Participation Level: Active  Behavioral Response: NAAlertAnxious  Type of Therapy: Individual Therapy  Treatment Goals addressed: Anxiety  Interventions: CBT and Strength-based  Summary: Faith Santiago is a 80 y.o. female who presents with improving symptoms related to depression and anxiety diagnosis. Pt reports that her overall mood has improved since last session and medication changes. Pt reports that anxiety has only been situational and manageable. Pt reports that quality and quantity of sleep has been inconsistent due to shoulder pain--waking frequently due to the pain. Pt states that she has an appt with PCP next week so she will mention the continuing shoulder pain.   Allowed pt to explore and express thoughts and feelings about recent/ongoing life stressors.  Pt stated that she is focusing intently on cleaning her home prior to her children coming to visit for Easter. Allowed pt to share how she  is breaking up larger projects into smaller, more manageable workloads. Pt is throwing out items, sorting items for donation, and is planning day by day. Reminded pt to allow pleasurable activities as a reward for the work that she is accomplishing and to allow time for her body to rest while working hard during the day to help with overall balance.  Pt is still being intentional about sewing/quilting by being active with classes and helping others with projects that they are focusing on.   Pt states that she has noticed that she is able to leave the house more and is able to find more pleasure/enjoyment into days.   Continued recommendations are as follows: self care behaviors, positive social engagements, focusing on overall work/home/life balance, and focusing on positive physical and emotional wellness.    Suicidal/Homicidal: No  Therapist Response: Faith Santiago is being intentional about engaging in recreational activities that reflect increased energy and interest. Faith Santiago is being an active participant in social/hobby groups. Faith Santiago is actively trying to problem solve and is breaking down larger projects in to smaller, more manageable projects. This is reflective of personal growth and progress. Treatment to continue  Plan: Return again in 6 weeks.  Diagnosis: Axis I: MDD, recurrent, in partial remission, anxiety    Axis II: No diagnosis    [name], LCSW 07/24/2020

## 2020-08-16 ENCOUNTER — Ambulatory Visit (INDEPENDENT_AMBULATORY_CARE_PROVIDER_SITE_OTHER): Payer: Medicare PPO | Admitting: Psychiatry

## 2020-08-16 ENCOUNTER — Encounter (HOSPITAL_COMMUNITY): Payer: Self-pay | Admitting: Psychiatry

## 2020-08-16 ENCOUNTER — Other Ambulatory Visit: Payer: Self-pay

## 2020-08-16 VITALS — BP 129/71 | HR 83 | Ht 64.0 in | Wt 201.0 lb

## 2020-08-16 DIAGNOSIS — F419 Anxiety disorder, unspecified: Secondary | ICD-10-CM

## 2020-08-16 DIAGNOSIS — F3342 Major depressive disorder, recurrent, in full remission: Secondary | ICD-10-CM

## 2020-08-16 MED ORDER — ALPRAZOLAM 0.25 MG PO TABS: 0.2500 mg | ORAL_TABLET | Freq: Every evening | ORAL | 2 refills | Status: DC | PRN

## 2020-08-16 NOTE — Progress Notes (Signed)
[place] MD OP Progress Note   08/16/2020 2:29 PM Faith Santiago  MRN:  [id number]  Chief Complaint:  " Things are going well"  HPI: Patient reported she is doing well.  She informed that her mood has been stable.  She informed that she has started working on quilts and has also rejoined the group of friends that were working on quilts and other things together. She also informed that she has been sitting outside in the sun with her husband in the backyard on a regular basis and that is also helped her mood. She is able to do more chores around the house. She is looking forward to going to a reunion party that is coming up next month where in her previous work colleagues will meet up after a long gap of many years.  She informed that she was employed at that place for 26 years and she is really excited about it.  She has already RSVP'd them.  She informed that for some reason the pharmacy has been sending her numerous bottles of the medications repeatedly.  She informed that she has 9-10 bottles of Abilify and 2 bottles each of Effexor and 1 extra bottle of Wellbutrin at home.  She thinks is the Adderall in the pharmacy is part. She stated that she does not read any refills to be sent for these medicines as she already has extra bottles at home.  She did request refill for her Xanax prescription.  The prescription was sent to her pharmacy.   Visit Diagnosis:    ICD-10-CM   1. MDD (major depressive disorder), recurrent, in full remission ([place])  F33.42   2. Anxiety  F41.9 ALPRAZolam (XANAX) 0.25 MG tablet    Past Psychiatric History: Depression, anxiety  Past Medical History:  Past Medical History:  Diagnosis Date  . Anemia, unspecified   . Anxiety   . Arthritis    hands R>L, neck and lower back  . Bimalleolar fracture of left ankle 11/19/2013   [place]  . Cataract   . COVID-19 05/2019  . Depression   . Esophageal reflux   . GERD with stricture 06/03/2007  . Glaucoma   . History of  osteopenia    DEXA WNL 2008, 2015  . History of uterine cancer 1999   s/p hysterectomy  . HLD (hyperlipidemia)   . Narcolepsy   . Thyroid disease   . Uterine cancer [facility])     Past Surgical History:  Procedure Laterality Date  . bilateral catarct removal  2010  . COLONOSCOPY  05/2003   WNL (Dr Velora Heckler)  . COLONOSCOPY  11/2017   3 small TA, diverticulisis, no f/u needed Carlean Purl)  . DEXA  03/2014   WNL T score +3.9  . ESOPHAGOGASTRODUODENOSCOPY  11/2017   dilated esoph stenosis, 8cm HH ([facility])  . ESOPHAGOGASTRODUODENOSCOPY  12/2017   repeat dilation of esoph stenosis, 5cm HH - rec stay on PPI Carlean Purl)  . ESOPHAGOGASTRODUODENOSCOPY  12/2019   GERD with esophagitis and peptic stricture s/p dilation to 80mm, mod sized HH - rec increase PPI to BID x 1 yr due to ongoing active inflammation on EGD Henrene Pastor)  . ORIF ANKLE FRACTURE Left 11/19/2013   Procedure: LEFT ANKLE FRACTURE OPEN TREATMENT BIMALLEOLAR ANKLE INCLUDES INTERNAL FIXATION ;  Surgeon: Johnny Bridge, MD  . RETINAL DETACHMENT REPAIR W/ SCLERAL BUCKLE LE Right 03/21/2016  . TOTAL ABDOMINAL HYSTERECTOMY  1999   uterine cancer    Family Psychiatric History: depression- mom  Family History:  Family History  Problem Relation Age of Onset  . Hypothyroidism Mother   . Stroke Mother        ministroke  . Coronary artery disease Mother 2       s/p some heart surgery  . Prostate cancer Father   . Diabetes Neg Hx   . Colon cancer Neg Hx   . Esophageal cancer Neg Hx   . Pancreatic cancer Neg Hx   . Stomach cancer Neg Hx   . Liver disease Neg Hx   . Rectal cancer Neg Hx     Social History:  Social History   Socioeconomic History  . Marital status: Married    Spouse name: Not on file  . Number of children: 2  . Years of education: Not on file  . Highest education level: Not on file  Occupational History  . Occupation: retired-occupational therapist  Tobacco Use  . Smoking status: Never Smoker  . Smokeless  tobacco: Never Used  Vaping Use  . Vaping Use: Never used  Substance and Sexual Activity  . Alcohol use: Yes    Alcohol/week: 7.0 - 12.0 standard drinks    Types: 7 - 10 Glasses of wine per week    Comment: 2 drinks a week  . Drug use: No  . Sexual activity: Not Currently  Other Topics Concern  . 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 Ciprofloxacin     REACTION: sun induced rash    Metabolic Disorder Labs: Lab Results  Component Value Date   HGBA1C 5.9 10/29/2018   No results found for: PROLACTIN Lab Results  Component Value Date   CHOL 288 (H) 11/02/2018   TRIG 316.0 (H) 11/02/2018   HDL 68.50 11/02/2018   CHOLHDL 4 11/02/2018   VLDL 63.2 (H) 11/02/2018   LDLCALC 146 (H) 08/06/2016   LDLCALC 157 (H) 03/17/2014   Lab Results  Component Value Date   TSH 1.73 10/29/2018   TSH 1.52 10/15/2017    Therapeutic Level Labs: No results found for: LITHIUM No results found for: VALPROATE No components found for:  CBMZ  Current Medications: Current Outpatient Medications  Medication Sig Dispense Refill  . ALPRAZolam (XANAX) 0.25 MG tablet Take 1 tablet (0.25 mg total) by mouth at bedtime as needed for anxiety. 30 tablet 2  . ARIPiprazole (ABILIFY) 5 MG tablet Take 1 tablet (5 mg total) by mouth daily. 90 tablet 0  . atorvastatin (LIPITOR) 20 MG tablet TAKE 1 TABLET BY MOUTH EVERY DAY 90 tablet 2  . buPROPion (WELLBUTRIN XL) 150 MG 24  hr tablet Take 1 tablet (150 mg total) by mouth in the morning. 90 tablet 0  . Cholecalciferol (VITAMIN D) 50 MCG (2000 UT) CAPS Take 1 capsule (2,000 Units total) by mouth daily. 30 capsule   . levothyroxine (SYNTHROID) 50 MCG tablet TAKE 1 TABLET BY MOUTH EVERY DAY 90 tablet 1  .  omeprazole (PRILOSEC) 40 MG capsule Take 1 capsule (40 mg total) by mouth 2 (two) times daily before a meal. 180 capsule 3  . venlafaxine XR (EFFEXOR XR) 75 MG 24 hr capsule Take 1 capsule with 150 mg capsule daily 90 capsule 0  . venlafaxine XR (EFFEXOR-XR) 150 MG 24 hr capsule Take 1 capsule with 75 mg capsule daily (total dose 225 mg daily) 90 capsule 0   No current facility-administered medications for this visit.      Psychiatric Specialty Exam: ROS    Blood pressure 129/71, pulse 83, height 5\' 4"  (1.626 m), weight 201 lb (91.2 kg), SpO2 97 %.Body mass index is 34.5 kg/m.  General Appearance: Well-groomed  Eye Contact:   Good  Speech:  Clear and Coherent and Normal Rate  Volume:  Normal  Mood:  Euthymic  Affect:  Congruent  Thought Process:  Goal Directed and Linear  Orientation:  Full (Time, Place, and Person)  Thought Content: Logical   Suicidal Thoughts:  No  Homicidal Thoughts:  No  Memory:  Recent;   Good Remote;   Good  Judgement:  Fair  Insight:  Fair  Psychomotor Activity:  normal  Concentration:  Concentration: Fair and Attention Span: Fair  Recall:  Good  Fund of Knowledge: Good  Language: Fair  Akathisia:  Negative  Handed:  Right  AIMS (if indicated): not done  Assets:  Communication Skills Desire for Improvement Financial Resources/Insurance Housing Social Support  ADL's:  Intact  Cognition: WNL  Sleep:  Good   Screenings: GAD-7   Health and safety inspector from 05/10/2020 in [place] Visit from 06/01/2015 in [place] at [facility]  Total GAD-7 Score 16 [place] from 10/15/2017 in  [place] at [place] from 04/02/2016 in [place] at [facility]  Total Score (max 30 points ) 20 20    PHQ2-9   [place] from 07/24/2020 in [place] from 05/10/2020 in [facility] [facility] Visit from 11/02/2018 in [place] at [facility] from 10/15/2017 in [place] at [place] from 08/13/2016 in [place] at [facility] Total Score 0 6 6 0 6  PHQ-9 Total Score -- 13 16 0 13    Flowsheet Row Counselor from 07/24/2020 in [place] from 06/29/2020 in [place] No Risk No Risk       Assessment and Plan: Patient appears to be stable on current regimen.  1. MDD (major depressive disorder), recurrent, in partial remission ([facility])  - buPROPion (WELLBUTRIN XL) 150 MG 24 hr tablet; Take 1 tablet (150 mg total) by mouth in the morning.  Dispense: 90 tablet; Refill: 0 - venlafaxine XR (EFFEXOR XR) 75 MG 24 hr capsule; Take 1 capsule with 150 mg capsule daily  Dispense: 90 capsule; Refill: 0 - venlafaxine XR (EFFEXOR-XR) 150 MG 24 hr capsule; Take 1 capsule with 75 mg capsule daily (total dose 225 mg daily)  Dispense: 90 capsule; Refill: 0 - ARIPiprazole (ABILIFY) 5 MG tablet; Take 1 tablet (5 mg total) by mouth daily.  Dispense: 90 tablet; Refill: 0  2. Anxiety  - ALPRAZolam (XANAX) 0.25 MG tablet; Take 1 tablet (0.25 mg total) by mouth at bedtime as needed for anxiety.  Dispense: 30 tablet; Refill: 1  3. Grief  Continue seeing Ms. Christina for therapy. Continue  same medication regimen. Follow up in 3 months. Patient was made aware that her care is being transferred to a different provider as the writer is leaving this clinic.  Patient verbalized her understanding   [place] Crane, MD 08/16/2020, 2:29 PM

## 2020-08-28 ENCOUNTER — Other Ambulatory Visit: Payer: Self-pay

## 2020-08-28 ENCOUNTER — Ambulatory Visit (INDEPENDENT_AMBULATORY_CARE_PROVIDER_SITE_OTHER): Payer: Medicare PPO | Admitting: Licensed Clinical Social Worker

## 2020-08-28 DIAGNOSIS — F419 Anxiety disorder, unspecified: Secondary | ICD-10-CM

## 2020-08-28 DIAGNOSIS — F3342 Major depressive disorder, recurrent, in full remission: Secondary | ICD-10-CM

## 2020-08-29 NOTE — Progress Notes (Signed)
Virtual Visit via Audio Note  I connected with Faith Santiago on 08/28/20 at  1:00 PM EDT by an audio enabled telemedicine application and verified that I am speaking with the correct person using two identifiers.  Location: Patient: home Provider: remote office [place], [place])   I discussed the limitations of evaluation and management by telemedicine and the availability of in person appointments. The patient expressed understanding and agreed to proceed.  I discussed the assessment and treatment plan with the patient. The patient was provided an opportunity to ask questions and all were answered. The patient agreed with the plan and demonstrated an understanding of the instructions.   The patient was advised to call back or seek an in-person evaluation if the symptoms worsen or if the condition fails to improve as anticipated.  I provided 42 minutes of non-face-to-face time during this encounter.   [place], LCSW    THERAPIST PROGRESS NOTE  Session Time: 1-1:42p  Participation Level: Active  Behavioral Response: Neat and Well GroomedAlertDepressed  Type of Therapy: Individual Therapy  Treatment Goals addressed: Coping  Interventions: CBT  Summary: Faith Santiago is a 80 y.o. female who presents with symptoms consistent with depression diagnosis. Pt reporting that she is feeling very low energy with a lack of motivation and initiative. Pt has a quilting project that she has been working on for a while but cannot take the first step to initiate the project. Discussed setting a deadline. Empowered pt to explore upcoming schedule and figure out what time would be best to set for the project initiation--pt states that she has a meeting with other quilters this coming Friday and that she will have free time on Saturday and Sunday.  Informed pt that I will call her on Monday to help pt with accountability and time management. Pt agreed and reminder set.   Pt is  continuing to have falls that require assistance of neighbors, EMS. Discussed overall safety within the home and external to home. Pt states that she has already taken a "fall prevention program" which lasted for 3 months and was completed this past autumn.   Continued recommendations are as follows: self care behaviors, positive social engagements, focusing on overall work/home/life balance, and focusing on positive physical and emotional wellness.   Suicidal/Homicidal: No  Therapist Response: Faith Santiago is being intentional about engaging in recreational activities that reflect increased energy and interest. Faith Santiago is being an active participant in social/hobby groups. Faith Santiago is actively trying to problem solve and is breaking down larger projects in to smaller, more manageable projects. This is reflective of personal growth and progress. Treatment to continue   Plan: Return again in 4 weeks.  Diagnosis: Axis I: MDD, recurrent, in remission    Axis II: No diagnosis    [place], LCSW 08/29/2020

## 2020-09-02 ENCOUNTER — Other Ambulatory Visit: Payer: Self-pay | Admitting: Family Medicine

## 2020-09-02 DIAGNOSIS — R7303 Prediabetes: Secondary | ICD-10-CM

## 2020-09-02 DIAGNOSIS — E785 Hyperlipidemia, unspecified: Secondary | ICD-10-CM

## 2020-09-02 DIAGNOSIS — E039 Hypothyroidism, unspecified: Secondary | ICD-10-CM

## 2020-09-02 DIAGNOSIS — D7589 Other specified diseases of blood and blood-forming organs: Secondary | ICD-10-CM

## 2020-09-02 DIAGNOSIS — Z1159 Encounter for screening for other viral diseases: Secondary | ICD-10-CM

## 2020-09-02 DIAGNOSIS — E559 Vitamin D deficiency, unspecified: Secondary | ICD-10-CM

## 2020-09-04 ENCOUNTER — Telehealth: Payer: Self-pay | Admitting: Licensed Clinical Social Worker

## 2020-09-04 NOTE — Telephone Encounter (Signed)
Follow up accountability check after last OPT appt.  Pt has worked hard on her projects and is feeling a sense of accomplishment.  Gave pt verbal praise and encouraged her to reward self for positive behaviors.  Follow up next OPT session.  Jeanmarie Plant, MSW, LCSW Outpatient Therapist/Triage Specialist

## 2020-09-05 ENCOUNTER — Other Ambulatory Visit (INDEPENDENT_AMBULATORY_CARE_PROVIDER_SITE_OTHER): Payer: Medicare PPO

## 2020-09-05 ENCOUNTER — Other Ambulatory Visit: Payer: Self-pay

## 2020-09-05 DIAGNOSIS — E039 Hypothyroidism, unspecified: Secondary | ICD-10-CM

## 2020-09-05 DIAGNOSIS — D7589 Other specified diseases of blood and blood-forming organs: Secondary | ICD-10-CM | POA: Diagnosis not present

## 2020-09-05 DIAGNOSIS — E785 Hyperlipidemia, unspecified: Secondary | ICD-10-CM | POA: Diagnosis not present

## 2020-09-05 DIAGNOSIS — Z1159 Encounter for screening for other viral diseases: Secondary | ICD-10-CM | POA: Diagnosis not present

## 2020-09-05 DIAGNOSIS — R7303 Prediabetes: Secondary | ICD-10-CM

## 2020-09-05 DIAGNOSIS — E559 Vitamin D deficiency, unspecified: Secondary | ICD-10-CM

## 2020-09-05 LAB — CBC WITH DIFFERENTIAL/PLATELET
Basophils Absolute: 0 10*3/uL (ref 0.0–0.1)
Basophils Relative: 0.6 % (ref 0.0–3.0)
Eosinophils Absolute: 0.2 10*3/uL (ref 0.0–0.7)
Eosinophils Relative: 2.6 % (ref 0.0–5.0)
HCT: 35.2 % — ABNORMAL LOW (ref 36.0–46.0)
Hemoglobin: 11.7 g/dL — ABNORMAL LOW (ref 12.0–15.0)
Lymphocytes Relative: 33.7 % (ref 12.0–46.0)
Lymphs Abs: 2 10*3/uL (ref 0.7–4.0)
MCHC: 33.4 g/dL (ref 30.0–36.0)
MCV: 97.7 fl (ref 78.0–100.0)
Monocytes Absolute: 0.5 10*3/uL (ref 0.1–1.0)
Monocytes Relative: 9.2 % (ref 3.0–12.0)
Neutro Abs: 3.2 10*3/uL (ref 1.4–7.7)
Neutrophils Relative %: 53.9 % (ref 43.0–77.0)
Platelets: 252 10*3/uL (ref 150.0–400.0)
RBC: 3.6 Mil/uL — ABNORMAL LOW (ref 3.87–5.11)
RDW: 14.3 % (ref 11.5–15.5)
WBC: 5.9 10*3/uL (ref 4.0–10.5)

## 2020-09-05 LAB — COMPREHENSIVE METABOLIC PANEL
ALT: 12 U/L (ref 0–35)
AST: 18 U/L (ref 0–37)
Albumin: 4.1 g/dL (ref 3.5–5.2)
Alkaline Phosphatase: 93 U/L (ref 39–117)
BUN: 23 mg/dL (ref 6–23)
CO2: 25 mEq/L (ref 19–32)
Calcium: 9.1 mg/dL (ref 8.4–10.5)
Chloride: 105 mEq/L (ref 96–112)
Creatinine, Ser: 1.52 mg/dL — ABNORMAL HIGH (ref 0.40–1.20)
GFR: 32.78 mL/min — ABNORMAL LOW (ref >60.00)
Glucose, Bld: 89 mg/dL (ref 70–99)
Potassium: 4.4 mEq/L (ref 3.5–5.1)
Sodium: 141 mEq/L (ref 135–145)
Total Bilirubin: 0.4 mg/dL (ref 0.2–1.2)
Total Protein: 6.5 g/dL (ref 6.0–8.3)

## 2020-09-05 LAB — TSH: TSH: 2.27 u[IU]/mL (ref 0.35–4.50)

## 2020-09-05 LAB — LIPID PANEL
Cholesterol: 214 mg/dL — ABNORMAL HIGH (ref 0–200)
HDL: 75.6 mg/dL (ref >39.00)
LDL Cholesterol: 100 mg/dL — ABNORMAL HIGH (ref 0–99)
NonHDL: 138.77
Total CHOL/HDL Ratio: 3
Triglycerides: 192 mg/dL — ABNORMAL HIGH (ref 0.0–149.0)
VLDL: 38.4 mg/dL (ref 0.0–40.0)

## 2020-09-05 LAB — VITAMIN D 25 HYDROXY (VIT D DEFICIENCY, FRACTURES): VITD: 26.21 ng/mL — ABNORMAL LOW (ref 30.00–100.00)

## 2020-09-05 LAB — HEMOGLOBIN A1C: Hgb A1c MFr Bld: 6 % (ref 4.6–6.5)

## 2020-09-05 LAB — VITAMIN B12: Vitamin B-12: 171 pg/mL — ABNORMAL LOW (ref 211–911)

## 2020-09-06 ENCOUNTER — Ambulatory Visit (INDEPENDENT_AMBULATORY_CARE_PROVIDER_SITE_OTHER): Payer: Medicare PPO

## 2020-09-06 ENCOUNTER — Encounter: Payer: Self-pay | Admitting: Family Medicine

## 2020-09-06 DIAGNOSIS — Z0000 Encounter for general adult medical examination without abnormal findings: Secondary | ICD-10-CM

## 2020-09-06 DIAGNOSIS — E538 Deficiency of other specified B group vitamins: Secondary | ICD-10-CM | POA: Insufficient documentation

## 2020-09-06 LAB — HEPATITIS C ANTIBODY
Hepatitis C Ab: NONREACTIVE
SIGNAL TO CUT-OFF: 0

## 2020-09-06 NOTE — Progress Notes (Signed)
Subjective:   Faith Santiago is a 80 y.o. female who presents for Medicare Annual (Subsequent) preventive examination.  Review of Systems: N/A      I connected with the patient today by telephone and verified that I am speaking with the correct person using two identifiers. Location patient: home Location nurse: work Persons participating in the telephone visit: patient, nurse.   I discussed the limitations, risks, security and privacy concerns of performing an evaluation and management service by telephone and the availability of in person appointments. I also discussed with the patient that there may be a patient responsible charge related to this service. The patient expressed understanding and verbally consented to this telephonic visit.        Cardiac Risk Factors include: advanced age (>70men, >74 women);Other (see comment), Risk factor comments: hyperlipidemia     Objective:    Today's Vitals   09/06/20 1026  PainSc: 8    There is no height or weight on file to calculate BMI.  Advanced Directives 09/06/2020 01/03/2020 09/01/2018 04/29/2018 01/22/2018 10/15/2017 04/02/2016  Does Patient Have a Medical Advance Directive? Yes Yes Yes Yes Yes Yes Yes  Type of Paramedic of [place];Living will [place];Living will - [place];Living will - [place];Living will [place];Living will  Does patient want to make changes to medical advance directive? - No - Patient declined - No - Patient declined - - -  Copy of [place] in Chart? No - copy requested No - copy requested - No - copy requested - No - copy requested No - copy requested  Some encounter information is confidential and restricted. Go to Review Flowsheets activity to see all data.    Current Medications (verified) Outpatient Encounter Medications as of 09/06/2020  Medication Sig  . ALPRAZolam (XANAX) 0.25  MG tablet Take 1 tablet (0.25 mg total) by mouth at bedtime as needed for anxiety.  . ARIPiprazole (ABILIFY) 5 MG tablet Take 1 tablet (5 mg total) by mouth daily.  Marland Kitchen atorvastatin (LIPITOR) 20 MG tablet TAKE 1 TABLET BY MOUTH EVERY DAY  . buPROPion (WELLBUTRIN XL) 150 MG 24 hr tablet Take 1 tablet (150 mg total) by mouth in the morning.  . Cholecalciferol (VITAMIN D) 50 MCG (2000 UT) CAPS Take 1 capsule (2,000 Units total) by mouth daily.  Marland Kitchen levothyroxine (SYNTHROID) 50 MCG tablet TAKE 1 TABLET BY MOUTH EVERY DAY  . omeprazole (PRILOSEC) 40 MG capsule Take 1 capsule (40 mg total) by mouth 2 (two) times daily before a meal.  . venlafaxine XR (EFFEXOR XR) 75 MG 24 hr capsule Take 1 capsule with 150 mg capsule daily  . venlafaxine XR (EFFEXOR-XR) 150 MG 24 hr capsule Take 1 capsule with 75 mg capsule daily (total dose 225 mg daily)   No facility-administered encounter medications on file as of 09/06/2020.    Allergies (verified) Ciprofloxacin   History: Past Medical History:  Diagnosis Date  . Anemia, unspecified   . Anxiety   . Arthritis    hands R>L, neck and lower back  . Bimalleolar fracture of left ankle 11/19/2013   [place]  . Cataract   . COVID-19 05/2019  . Depression   . Esophageal reflux   . GERD with stricture 06/03/2007  . Glaucoma   . History of osteopenia    DEXA WNL 2008, 2015  . History of uterine cancer 1999   s/p hysterectomy  . HLD (hyperlipidemia)   .  Narcolepsy   . Thyroid disease   . Uterine cancer [facility])    Past Surgical History:  Procedure Laterality Date  . bilateral catarct removal  2010  . COLONOSCOPY  05/2003   WNL (Dr Velora Heckler)  . COLONOSCOPY  11/2017   3 small TA, diverticulisis, no f/u needed Carlean Purl)  . DEXA  03/2014   WNL T score +3.9  . ESOPHAGOGASTRODUODENOSCOPY  11/2017   dilated esoph stenosis, 8cm HH ([facility])  . ESOPHAGOGASTRODUODENOSCOPY  12/2017   repeat dilation of esoph stenosis, 5cm HH - rec stay on PPI Carlean Purl)  .  ESOPHAGOGASTRODUODENOSCOPY  12/2019   GERD with esophagitis and peptic stricture s/p dilation to 63mm, mod sized HH - rec increase PPI to BID x 1 yr due to ongoing active inflammation on EGD Henrene Pastor)  . ORIF ANKLE FRACTURE Left 11/19/2013   Procedure: LEFT ANKLE FRACTURE OPEN TREATMENT BIMALLEOLAR ANKLE INCLUDES INTERNAL FIXATION ;  Surgeon: Johnny Bridge, MD  . RETINAL DETACHMENT REPAIR W/ SCLERAL BUCKLE LE Right 03/21/2016  . TOTAL ABDOMINAL HYSTERECTOMY  1999   uterine cancer   Family History  Problem Relation Age of Onset  . Hypothyroidism Mother   . Stroke Mother        ministroke  . Coronary artery disease Mother 37       s/p some heart surgery  . Prostate cancer Father   . Diabetes Neg Hx   . Colon cancer Neg Hx   . Esophageal cancer Neg Hx   . Pancreatic cancer Neg Hx   . Stomach cancer Neg Hx   . Liver disease Neg Hx   . Rectal cancer Neg Hx    Social History   Socioeconomic History  . Marital status: Married    Spouse name: Not on file  . Number of children: 2  . Years of education: Not on file  . Highest education level: Not on file  Occupational History  . Occupation: retired-occupational therapist  Tobacco Use  . Smoking status: Never Smoker  . Smokeless tobacco: Never Used  Vaping Use  . Vaping Use: Never used  Substance and Sexual Activity  . Alcohol use: Yes    Alcohol/week: 7.0 - 12.0 standard drinks    Types: 7 - 10 Glasses of wine per week    Comment: moderate  . Drug use: No  . Sexual activity: Not Currently  Other Topics Concern  . Not on file  Social History Narrative   caffeine: none   Lives with husband, has 2 grown children, 1 cat   Occupation: retired, was OT for [employer]   Activity: no regular exercise, to start silver sneakers   Diet: overeating, good amt water, vegetables daily, occasional fruits   Never smoker   EtOH 1/day   Social Determinants of Health   Financial Resource Strain: Low Risk   . Difficulty of Paying Living Expenses:  Not hard at all  Food Insecurity: No Food Insecurity  . Worried About Charity fundraiser in the Last Year: Never true  . Ran Out of Food in the Last Year: Never true  Transportation Needs: No Transportation Needs  . Lack of Transportation (Medical): No  . Lack of Transportation (Non-Medical): No  Physical Activity: Inactive  . Days of Exercise per Week: 0 days  . Minutes of Exercise per Session: 0 min  Stress: Stress Concern Present  . Feeling of Stress : To some extent  Social Connections: Not on file    Tobacco Counseling Counseling given: Not Answered  Clinical Intake:  Pre-visit preparation completed: Yes  Pain : 0-10 Pain Score: 8  Pain Type: Chronic pain Pain Location: Back Pain Orientation: Lower Pain Descriptors / Indicators: Aching Pain Onset: More than a month ago Pain Frequency: Intermittent     Nutritional Risks: None Diabetes: No  How often do you need to have someone help you when you read instructions, pamphlets, or other written materials from your doctor or pharmacy?: 1 - Never What is the last grade level you completed in school?: college graduate  Diabetic: No Nutrition Risk Assessment:  Has the patient had any N/V/D within the last 2 months?  No  Does the patient have any non-healing wounds?  No  Has the patient had any unintentional weight loss or weight gain?  No   Diabetes:  Is the patient diabetic?  No  If diabetic, was a CBG obtained today?  N/A Did the patient bring in their glucometer from home?  N/A How often do you monitor your CBG's? N/A.   Financial Strains and Diabetes Management:  Are you having any financial strains with the device, your supplies or your medication? N/A.  Does the patient want to be seen by Chronic Care Management for management of their diabetes?  N/A Would the patient like to be referred to a Nutritionist or for Diabetic Management?  N/A Interpreter Needed?: No  Information entered by :: CJohnson,  LPN   Activities of Daily Living In your present state of health, do you have any difficulty performing the following activities: 09/06/2020  Hearing? N  Vision? N  Difficulty concentrating or making decisions? N  Walking or climbing stairs? N  Dressing or bathing? N  Doing errands, shopping? N  Preparing Food and eating ? N  Using the Toilet? N  In the past six months, have you accidently leaked urine? Y  Do you have problems with loss of bowel control? N  Managing your Medications? N  Managing your Finances? N  Housekeeping or managing your Housekeeping? N  Some recent data might be hidden    Patient Care Team: [name], MD as PCP - General (Family Medicine) Thelma Comp, [place] as Consulting Physician (Optometry)  Indicate any recent Medical Services you may have received from other than [facility] providers in the past year (date may be approximate).     Assessment:   This is a routine wellness examination for Faith Santiago.  Hearing/Vision screen  Hearing Screening   125Hz  250Hz  500Hz  1000Hz  2000Hz  3000Hz  4000Hz  6000Hz  8000Hz   Right ear:           Left ear:           Vision Screening Comments: Patient gets annual eye exams   Dietary issues and exercise activities discussed: Current Exercise Habits: The patient does not participate in regular exercise at present, Exercise limited by: None identified  Goals Addressed            This Visit's Progress   . Patient Stated       09/06/2020, I will maintain and continue medications as prescribed.       Depression Screen PHQ 2/9 Scores 09/06/2020 11/02/2018 10/15/2017 08/13/2016 04/02/2016 06/01/2015 04/07/2015  PHQ - 2 Score 2 6 0 6 0 6 6  PHQ- 9 Score 2 16 0 13 - 25 19  Some encounter information is confidential and restricted. Go to Review Flowsheets activity to see all data.    Fall Risk Fall Risk  09/06/2020 11/02/2018 10/15/2017 08/13/2016 04/02/2016  Falls in the past  year? 1 1 No No No  Number falls in past yr: 1 0 - - -   Comment - - - - -  Injury with Fall? 0 0 - - -  Risk Factor Category  - - - - -  Comment - - - - -  Risk for fall due to : History of fall(s);Medication side effect - - - -  Follow up Falls evaluation completed;Falls prevention discussed - - - -    FALL RISK PREVENTION PERTAINING TO THE HOME:  Any stairs in or around the home? Yes  If so, are there any without handrails? No  Home free of loose throw rugs in walkways, pet beds, electrical cords, etc? Yes  Adequate lighting in your home to reduce risk of falls? Yes   ASSISTIVE DEVICES UTILIZED TO PREVENT FALLS:  Life alert? No  Use of a cane, walker or w/c? No  Grab bars in the bathroom? No  Shower chair or bench in shower? No  Elevated toilet seat or a handicapped toilet? No   TIMED UP AND GO:  Was the test performed? N/A telephone visit .    Cognitive Function: MMSE - Mini Mental State Exam 09/06/2020 10/15/2017 04/02/2016  Not completed: Refused - -  Orientation to time - 5 5  Orientation to Place - 5 5  Registration - 3 3  Attention/ Calculation - 0 0  Recall - 3 3  Language- name 2 objects - 0 0  Language- repeat - 1 1  Language- follow 3 step command - 3 3  Language- read & follow direction - 0 0  Write a sentence - 0 0  Copy design - 0 0  Total score - 20 20  Mini Cog  Mini-Cog screen was not completed. Patient wanted to skip this. Maximum score is 22. A value of 0 denotes this part of the MMSE was not completed or the patient failed this part of the Mini-Cog screening.       Immunizations Immunization History  Administered Date(s) Administered  . Fluad Quad(high Dose 65+) 01/09/2019  . Influenza, High Dose Seasonal PF 01/18/2016, 02/10/2017, 02/04/2018  . Influenza,inj,Quad PF,6+ Mos 01/12/2014, 03/28/2015  . PFIZER(Purple Top)SARS-COV-2 Vaccination 08/20/2019, 09/14/2019  . Pneumococcal Conjugate-13 03/15/2014  . Pneumococcal Polysaccharide-23 06/07/2009  . Td 09/11/2007, 04/15/2014  . Zoster  Recombinat (Shingrix) 01/09/2019, 08/05/2019    TDAP status: Up to date  Flu Vaccine status: due Fall 2022  Pneumococcal vaccine status: Up to date Covid-19 vaccine status: 2 doses completed, booster declined.   Qualifies for Shingles Vaccine? Yes   Zostavax completed No   Shingrix Completed?: Yes  Screening Tests Health Maintenance  Topic Date Due  . Hepatitis C Screening  Never done  . MAMMOGRAM  11/20/2018  . COVID-19 Vaccine (3 - Booster for [place] series) 09/22/2020 (Originally 02/14/2020)  . INFLUENZA VACCINE  11/13/2020  . TETANUS/TDAP  04/15/2024  . DEXA SCAN  Completed  . PNA vac Low Risk Adult  Completed  . HPV VACCINES  Aged Out    Health Maintenance  Health Maintenance Due  Topic Date Due  . Hepatitis C Screening  Never done  . MAMMOGRAM  11/20/2018    Colorectal cancer screening: No longer required.   Mammogram status: due, Patient will call and schedule appointment   Bone Density status:due, Patient will call and schedule appointment.   Lung Cancer Screening: (Low Dose CT Chest recommended if Age 62-80 years, 30 pack-year currently smoking OR have quit w/in 15 years.) does  not qualify.   Additional Screening:  Hepatitis C Screening: does qualify; Completed: due   Vision Screening: Recommended annual ophthalmology exams for early detection of glaucoma and other disorders of the eye. Is the patient up to date with their annual eye exam?  Yes  Who is the provider or what is the name of the office in which the patient attends annual eye exams? Dr. Rick Duff If pt is not established with a provider, would they like to be referred to a provider to establish care? No .   Dental Screening: Recommended annual dental exams for proper oral hygiene  Community Resource Referral / Chronic Care Management: CRR required this visit?  No   CCM required this visit?  No      Plan:     I have personally reviewed and noted the following in the patient's chart:    . Medical and social history . Use of alcohol, tobacco or illicit drugs  . Current medications and supplements including opioid prescriptions.  . Functional ability and status . Nutritional status . Physical activity . Advanced directives . List of other physicians . Hospitalizations, surgeries, and ER visits in previous 12 months . Vitals . Screenings to include cognitive, depression, and falls . Referrals and appointments  In addition, I have reviewed and discussed with patient certain preventive protocols, quality metrics, and best practice recommendations. A written personalized care plan for preventive services as well as general preventive health recommendations were provided to patient.   Due to this being a telephonic visit, the after visit summary with patients personalized plan was offered to patient via office or my-chart. Patient preferred to pick up at office at next visit or via mychart.   Andrez Grime, LPN   [id number]

## 2020-09-06 NOTE — Progress Notes (Signed)
PCP notes:  Health Maintenance: Mammogram- due Dexa- due Covid booster- declined   Abnormal Screenings: none   Patient concerns: Shoulder pain Urinary incontinence    Nurse concerns: none   Next PCP appt.: 09/12/2020 @ 3 pm

## 2020-09-06 NOTE — Patient Instructions (Signed)
Faith Santiago , Thank you for taking time to come for your Medicare Wellness Visit. I appreciate your ongoing commitment to your health goals. Please review the following plan we discussed and let me know if I can assist you in the future.   Screening recommendations/referrals: Colonoscopy: no longer required Mammogram: due, Please call and schedule appointment  Bone Density:  due, Please call and schedule appointment  Recommended yearly ophthalmology/optometry visit for glaucoma screening and checkup Recommended yearly dental visit for hygiene and checkup  Vaccinations: Influenza vaccine: due Fall 2022 Pneumococcal vaccine: Completed series Tdap vaccine: Up to date, completed 04/15/2014, due 04/2024 Shingles vaccine: Completed series   Covid-19:2 doses completed. Booster declined   Advanced directives: Please bring a copy of your POA (Power of [place]) and/or Living Will to your next appointment.   Conditions/risks identified: hyperlipidemia   Next appointment: Follow up in one year for your annual wellness visit    Preventive Care 78 Years and Older, Female Preventive care refers to lifestyle choices and visits with your health care provider that can promote health and wellness. What does preventive care include?  A yearly physical exam. This is also called an annual well check.  Dental exams once or twice a year.  Routine eye exams. Ask your health care provider how often you should have your eyes checked.  Personal lifestyle choices, including:  Daily care of your teeth and gums.  Regular physical activity.  Eating a healthy diet.  Avoiding tobacco and drug use.  Limiting alcohol use.  Practicing safe sex.  Taking low-dose aspirin every day.  Taking vitamin and mineral supplements as recommended by your health care provider. What happens during an annual well check? The services and screenings done by your health care provider during your annual well check will  depend on your age, overall health, lifestyle risk factors, and family history of disease. Counseling  Your health care provider may ask you questions about your:  Alcohol use.  Tobacco use.  Drug use.  Emotional well-being.  Home and relationship well-being.  Sexual activity.  Eating habits.  History of falls.  Memory and ability to understand (cognition).  Work and work Statistician.  Reproductive health. Screening  You may have the following tests or measurements:  Height, weight, and BMI.  Blood pressure.  Lipid and cholesterol levels. These may be checked every 5 years, or more frequently if you are over 62 years old.  Skin check.  Lung cancer screening. You may have this screening every year starting at age 58 if you have a 30-pack-year history of smoking and currently smoke or have quit within the past 15 years.  Fecal occult blood test (FOBT) of the stool. You may have this test every year starting at age 66.  Flexible sigmoidoscopy or colonoscopy. You may have a sigmoidoscopy every 5 years or a colonoscopy every 10 years starting at age 62.  Hepatitis C blood test.  Hepatitis B blood test.  Sexually transmitted disease (STD) testing.  Diabetes screening. This is done by checking your blood sugar (glucose) after you have not eaten for a while (fasting). You may have this done every 1-3 years.  Bone density scan. This is done to screen for osteoporosis. You may have this done starting at age 40.  Mammogram. This may be done every 1-2 years. Talk to your health care provider about how often you should have regular mammograms. Talk with your health care provider about your test results, treatment options, and if necessary, the  need for more tests. Vaccines  Your health care provider may recommend certain vaccines, such as:  Influenza vaccine. This is recommended every year.  Tetanus, diphtheria, and acellular pertussis (Tdap, Td) vaccine. You may need a  Td booster every 10 years.  Zoster vaccine. You may need this after age 41.  Pneumococcal 13-valent conjugate (PCV13) vaccine. One dose is recommended after age 62.  Pneumococcal polysaccharide (PPSV23) vaccine. One dose is recommended after age 29. Talk to your health care provider about which screenings and vaccines you need and how often you need them. This information is not intended to replace advice given to you by your health care provider. Make sure you discuss any questions you have with your health care provider. Document Released: 04/28/2015 Document Revised: 12/20/2015 Document Reviewed: 01/31/2015 Elsevier Interactive Patient Education  2017 [place] Prevention in the Home Falls can cause injuries. They can happen to people of all ages. There are many things you can do to make your home safe and to help prevent falls. What can I do on the outside of my home?  Regularly fix the edges of walkways and driveways and fix any cracks.  Remove anything that might make you trip as you walk through a door, such as a raised step or threshold.  Trim any bushes or trees on the path to your home.  Use bright outdoor lighting.  Clear any walking paths of anything that might make someone trip, such as rocks or tools.  Regularly check to see if handrails are loose or broken. Make sure that both sides of any steps have handrails.  Any raised decks and porches should have guardrails on the edges.  Have any leaves, snow, or ice cleared regularly.  Use sand or salt on walking paths during winter.  Clean up any spills in your garage right away. This includes oil or grease spills. What can I do in the bathroom?  Use night lights.  Install grab bars by the toilet and in the tub and shower. Do not use towel bars as grab bars.  Use non-skid mats or decals in the tub or shower.  If you need to sit down in the shower, use a plastic, non-slip stool.  Keep the floor dry. Clean  up any water that spills on the floor as soon as it happens.  Remove soap buildup in the tub or shower regularly.  Attach bath mats securely with double-sided non-slip rug tape.  Do not have throw rugs and other things on the floor that can make you trip. What can I do in the bedroom?  Use night lights.  Make sure that you have a light by your bed that is easy to reach.  Do not use any sheets or blankets that are too big for your bed. They should not hang down onto the floor.  Have a firm chair that has side arms. You can use this for support while you get dressed.  Do not have throw rugs and other things on the floor that can make you trip. What can I do in the kitchen?  Clean up any spills right away.  Avoid walking on wet floors.  Keep items that you use a lot in easy-to-reach places.  If you need to reach something above you, use a strong step stool that has a grab bar.  Keep electrical cords out of the way.  Do not use floor polish or wax that makes floors slippery. If you must use wax,  use non-skid floor wax.  Do not have throw rugs and other things on the floor that can make you trip. What can I do with my stairs?  Do not leave any items on the stairs.  Make sure that there are handrails on both sides of the stairs and use them. Fix handrails that are broken or loose. Make sure that handrails are as long as the stairways.  Check any carpeting to make sure that it is firmly attached to the stairs. Fix any carpet that is loose or worn.  Avoid having throw rugs at the top or bottom of the stairs. If you do have throw rugs, attach them to the floor with carpet tape.  Make sure that you have a light switch at the top of the stairs and the bottom of the stairs. If you do not have them, ask someone to add them for you. What else can I do to help prevent falls?  Wear shoes that:  Do not have high heels.  Have rubber bottoms.  Are comfortable and fit you well.  Are  closed at the toe. Do not wear sandals.  If you use a stepladder:  Make sure that it is fully opened. Do not climb a closed stepladder.  Make sure that both sides of the stepladder are locked into place.  Ask someone to hold it for you, if possible.  Clearly mark and make sure that you can see:  Any grab bars or handrails.  First and last steps.  Where the edge of each step is.  Use tools that help you move around (mobility aids) if they are needed. These include:  Canes.  Walkers.  Scooters.  Crutches.  Turn on the lights when you go into a dark area. Replace any light bulbs as soon as they burn out.  Set up your furniture so you have a clear path. Avoid moving your furniture around.  If any of your floors are uneven, fix them.  If there are any pets around you, be aware of where they are.  Review your medicines with your doctor. Some medicines can make you feel dizzy. This can increase your chance of falling. Ask your doctor what other things that you can do to help prevent falls. This information is not intended to replace advice given to you by your health care provider. Make sure you discuss any questions you have with your health care provider. Document Released: 01/26/2009 Document Revised: 09/07/2015 Document Reviewed: 05/06/2014 Elsevier Interactive Patient Education  2017 [employer].

## 2020-09-07 ENCOUNTER — Telehealth: Payer: Self-pay

## 2020-09-07 NOTE — Telephone Encounter (Signed)
Lvm asking pt to call back.  Need to relay Dr. Synthia Innocent message and get answer to his question.  Dr. Synthia Innocent msg: Will review in detail at [place] however please notify - kidney function has deteriorated as well as new anemia and very low B12 levels - how is she feeling? Encourage increased water intake as long as making urine ok.

## 2020-09-08 NOTE — Telephone Encounter (Signed)
Lvm asking pt to call back.  Need to relay Dr. Synthia Innocent message and get answer to his question.  Dr. Synthia Innocent msg: Will review in detail at [place] however please notify - kidney function has deteriorated as well as new anemia and very low B12 levels - how is she feeling? Encourage increased water intake as long as making urine ok.

## 2020-09-12 ENCOUNTER — Encounter: Payer: Self-pay | Admitting: Family Medicine

## 2020-09-12 ENCOUNTER — Other Ambulatory Visit: Payer: Self-pay

## 2020-09-12 ENCOUNTER — Ambulatory Visit (INDEPENDENT_AMBULATORY_CARE_PROVIDER_SITE_OTHER): Payer: Medicare PPO | Admitting: Family Medicine

## 2020-09-12 VITALS — BP 118/70 | HR 102 | Temp 97.8°F | Ht 64.0 in | Wt 195.6 lb

## 2020-09-12 DIAGNOSIS — R7303 Prediabetes: Secondary | ICD-10-CM | POA: Diagnosis not present

## 2020-09-12 DIAGNOSIS — E538 Deficiency of other specified B group vitamins: Secondary | ICD-10-CM | POA: Diagnosis not present

## 2020-09-12 DIAGNOSIS — E559 Vitamin D deficiency, unspecified: Secondary | ICD-10-CM

## 2020-09-12 DIAGNOSIS — W19XXXD Unspecified fall, subsequent encounter: Secondary | ICD-10-CM

## 2020-09-12 DIAGNOSIS — Z0001 Encounter for general adult medical examination with abnormal findings: Secondary | ICD-10-CM

## 2020-09-12 DIAGNOSIS — E785 Hyperlipidemia, unspecified: Secondary | ICD-10-CM | POA: Diagnosis not present

## 2020-09-12 DIAGNOSIS — E039 Hypothyroidism, unspecified: Secondary | ICD-10-CM | POA: Diagnosis not present

## 2020-09-12 DIAGNOSIS — N3941 Urge incontinence: Secondary | ICD-10-CM

## 2020-09-12 DIAGNOSIS — M25511 Pain in right shoulder: Secondary | ICD-10-CM | POA: Diagnosis not present

## 2020-09-12 DIAGNOSIS — Z0000 Encounter for general adult medical examination without abnormal findings: Secondary | ICD-10-CM

## 2020-09-12 DIAGNOSIS — F332 Major depressive disorder, recurrent severe without psychotic features: Secondary | ICD-10-CM

## 2020-09-12 DIAGNOSIS — N289 Disorder of kidney and ureter, unspecified: Secondary | ICD-10-CM

## 2020-09-12 DIAGNOSIS — Z8542 Personal history of malignant neoplasm of other parts of uterus: Secondary | ICD-10-CM

## 2020-09-12 DIAGNOSIS — Z7189 Other specified counseling: Secondary | ICD-10-CM

## 2020-09-12 DIAGNOSIS — D649 Anemia, unspecified: Secondary | ICD-10-CM | POA: Diagnosis not present

## 2020-09-12 MED ORDER — LEVOTHYROXINE SODIUM 50 MCG PO TABS: 50.0000 ug | ORAL_TABLET | Freq: Every day | ORAL | 3 refills | Status: AC

## 2020-09-12 MED ORDER — CYANOCOBALAMIN 1000 MCG/ML IJ SOLN
1000.0000 ug | Freq: Once | INTRAMUSCULAR | Status: AC
  Administered 2020-09-12: 1000 ug via INTRAMUSCULAR

## 2020-09-12 MED ORDER — B-12 1000 MCG SL SUBL: 1.0000 | SUBLINGUAL_TABLET | Freq: Every day | SUBLINGUAL | Status: AC

## 2020-09-12 MED ORDER — ATORVASTATIN CALCIUM 20 MG PO TABS: 1.0000 | ORAL_TABLET | Freq: Every day | ORAL | 3 refills | Status: AC

## 2020-09-12 NOTE — Assessment & Plan Note (Signed)
Preventative protocols reviewed and updated unless pt declined. Discussed healthy diet and lifestyle.  

## 2020-09-12 NOTE — Assessment & Plan Note (Signed)
Advanced directives - living will at home. Would want husband to be HCPOA. Will bring me copy.

## 2020-09-12 NOTE — Telephone Encounter (Signed)
Lvm asking pt to call back.  Need to relay Dr. Synthia Innocent message and get answer to his question.  Pt has OV today.   Dr. Synthia Innocent msg: Will review in detail at [place] however please notify - kidney function has deteriorated as well as new anemia and very low B12 levels - how is she feeling? Encourage increased water intake as long as making urine ok.

## 2020-09-12 NOTE — Patient Instructions (Addendum)
Call to schedule mammogram at your convenience: [place] [phone] Bring [place] copy of advanced directive/living will.  Restart vitamin B12 1000 mcg daily. B12 shot today.  Restart vitamin D3 1000-2000 units daily.  Kidney function was worse today - increase water intake by 2-3 8oz glasses a day.  Pass by lab for stool kit and urinalysis today.  Return in 2-3 months for lab visit to check blood work and afterwards for office visit to review.  Return in 1 year for next wellness/physical.   Health Maintenance After Age 19 After age 28, you are at a higher risk for certain long-term diseases and infections as well as injuries from falls. Falls are a major cause of broken bones and head injuries in people who are older than age 37. Getting regular preventive care can help to keep you healthy and well. Preventive care includes getting regular testing and making lifestyle changes as recommended by your health care provider. Talk with your health care provider about:  Which screenings and tests you should have. A screening is a test that checks for a disease when you have no symptoms.  A diet and exercise plan that is right for you. What should I know about screenings and tests to prevent falls? Screening and testing are the best ways to find a health problem early. Early diagnosis and treatment give you the best chance of managing medical conditions that are common after age 51. Certain conditions and lifestyle choices may make you more likely to have a fall. Your health care provider may recommend:  Regular vision checks. Poor vision and conditions such as cataracts can make you more likely to have a fall. If you wear glasses, make sure to get your prescription updated if your vision changes.  Medicine review. Work with your health care provider to regularly review all of the medicines you are taking, including over-the-counter medicines. Ask your health care provider about any  side effects that may make you more likely to have a fall. Tell your health care provider if any medicines that you take make you feel dizzy or sleepy.  Osteoporosis screening. Osteoporosis is a condition that causes the bones to get weaker. This can make the bones weak and cause them to break more easily.  Blood pressure screening. Blood pressure changes and medicines to control blood pressure can make you feel dizzy.  Strength and balance checks. Your health care provider may recommend certain tests to check your strength and balance while standing, walking, or changing positions.  Foot health exam. Foot pain and numbness, as well as not wearing proper footwear, can make you more likely to have a fall.  Depression screening. You may be more likely to have a fall if you have a fear of falling, feel emotionally low, or feel unable to do activities that you used to do.  Alcohol use screening. Using too much alcohol can affect your balance and may make you more likely to have a fall. What actions can I take to lower my risk of falls? General instructions  Talk with your health care provider about your risks for falling. Tell your health care provider if: ? You fall. Be sure to tell your health care provider about all falls, even ones that seem minor. ? You feel dizzy, sleepy, or off-balance.  Take over-the-counter and prescription medicines only as told by your health care provider. These include any supplements.  Eat a healthy diet and maintain a healthy weight. A healthy  diet includes low-fat dairy products, low-fat (lean) meats, and fiber from whole grains, beans, and lots of fruits and vegetables. Home safety  Remove any tripping hazards, such as rugs, cords, and clutter.  Install safety equipment such as grab bars in bathrooms and safety rails on stairs.  Keep rooms and walkways well-lit. Activity  Follow a regular exercise program to stay fit. This will help you maintain your  balance. Ask your health care provider what types of exercise are appropriate for you.  If you need a cane or walker, use it as recommended by your health care provider.  Wear supportive shoes that have nonskid soles.   Lifestyle  Do not drink alcohol if your health care provider tells you not to drink.  If you drink alcohol, limit how much you have: ? 0-1 drink a day for women. ? 0-2 drinks a day for men.  Be aware of how much alcohol is in your drink. In the [place] one drink equals one typical bottle of beer (12 oz), one-half glass of wine (5 oz), or one shot of hard liquor (1 oz).  Do not use any products that contain nicotine or tobacco, such as cigarettes and e-cigarettes. If you need help quitting, ask your health care provider. Summary  Having a healthy lifestyle and getting preventive care can help to protect your health and wellness after age 70.  Screening and testing are the best way to find a health problem early and help you avoid having a fall. Early diagnosis and treatment give you the best chance for managing medical conditions that are more common for people who are older than age 25.  Falls are a major cause of broken bones and head injuries in people who are older than age 45. Take precautions to prevent a fall at home.  Work with your health care provider to learn what changes you can make to improve your health and wellness and to prevent falls. This information is not intended to replace advice given to you by your health care provider. Make sure you discuss any questions you have with your health care provider. Document Revised: 07/23/2018 Document Reviewed: 02/12/2017 Elsevier Patient Education  2021 [employer].

## 2020-09-12 NOTE — Progress Notes (Signed)
Patient ID: Faith Santiago, female    DOB: 1940/06/12, 80 y.o.   MRN: [id number]  This visit was conducted in person.  BP 118/70   Pulse (!) 102   Temp 97.8 F (36.6 C) (Temporal)   Ht _0  (1.626 m)   Wt 195 lb 9 oz (88.7 kg)   SpO2 95%   BMI 33.57 kg/m    CC: CPE  Subjective:   HPI: Faith Santiago is a 80 y.o. female presenting on 09/12/2020 for Annual Exam (Prt 2. )   Saw health advisor last week for medicare wellness visit. Note reviewed. Some shoulder pain and urinary incontinence issues.    No exam data present  Flowsheet Row Clinical Support from 09/06/2020 in [place] at [facility] Total Score 2      Fall Risk  09/06/2020 11/02/2018 10/15/2017 08/13/2016 04/02/2016  Falls in the past year? 1 1 No No No  Number falls in past yr: 1 0 - - -  Comment - - - - -  Injury with Fall? 0 0 - - -  Risk Factor Category  - - - - -  Comment - - - - -  Risk for fall due to : History of fall(s);Medication side effect - - - -  Follow up Falls evaluation completed;Falls prevention discussed - - - -  Several falls recently, one 6 wks ago while in bedroom. Fell on carpet, landed on right shoulder. R shoulder pain ever since. Had another fall 4 wks ago and again hit R shoulder. Worse pain at night time. Points to lateral lower R upper arm near elbow. Slowly improving, managing with advil 485m bid.   She and husband did develop COVID 05/2019. Symptoms fully resolved.   Chronic lower back pain but no other bone pains (besides R shoulder). Pain worse with prolonged walking/standing.   Dr KToy Careis leaving practice. Dr PHowell Ruckswill take over as psychiatrist. Continues seeing counselor regularly.   Preventative: COLONOSCOPY 11/2017 - 3 small TA, diverticulosis, no f/u needed (Carlean Purl. She denies blood in stool or bowel changes.  3D mammogram 8/2019WNL. Declines breast exam. Agrees to reschedule.  Well woman exam-s/ptotalhysterectomy1999(ovaries removed)for uterine  cancer. She did have radiation afterwards. Pap smear WNLuntil5/2013. Tapered off estrogen therapy. Pelvic exam alone performed 11/2017, WNL.Denies vaginal bleeding or skin changes.  DEXA Date: 03/2014 WNL T score +3.9  Fluyearly. C[place]5/2021, 09/2019, no booster Pneumovax 2011, prevnar 2015. Consider rpt pneumovax.  Td 2009, 2016 Shingrix - 12/2018, 07/2019 Advanced directives - living will at home. Would want husband to be HCPOA. Will bring me copy.  Seat belt use discussed Sunscreen use discussed, no changing moles on skin. Non smoker Alcohol - <1 glass wine/day  Dentist Q6 mo  Eye exam yearly  Bowel - no constipation. Occasional loose stools/diarrhea with stool accidents, about once a month.  Bladder - + incontinence about once a week. Urge > stress incontinence. She regularly wears depends diapers.  She completed PFPT and fall prevention program in the past.   Caffeine: couple cups/day  Lives with husband, 2 grown children, 1 cat  Occupation: retired, was OT for [employer] Activity:planning to sign up for [employer] Diet: overeating, good amt water, vegetables daily, occasional fruits     Relevant past medical, surgical, family and social history reviewed and updated as indicated. Interim medical history since our last visit reviewed. Allergies and medications reviewed and updated. Outpatient Medications Prior to Visit  Medication Sig Dispense Refill  .  ALPRAZolam (XANAX) 0.25 MG tablet Take 1 tablet (0.25 mg total) by mouth at bedtime as needed for anxiety. 30 tablet 2  . ARIPiprazole (ABILIFY) 5 MG tablet Take 1 tablet (5 mg total) by mouth daily. 90 tablet 0  . buPROPion (WELLBUTRIN XL) 150 MG 24 hr tablet Take 1 tablet (150 mg total) by mouth in the morning. 90 tablet 0  . Cholecalciferol (VITAMIN D) 50 MCG (2000 UT) CAPS Take 1 capsule (2,000 Units total) by mouth daily. 30 capsule   . omeprazole (PRILOSEC) 40 MG capsule Take 1 capsule (40 mg total) by mouth 2 (two) times  daily before a meal. 180 capsule 3  . venlafaxine XR (EFFEXOR XR) 75 MG 24 hr capsule Take 1 capsule with 150 mg capsule daily 90 capsule 0  . venlafaxine XR (EFFEXOR-XR) 150 MG 24 hr capsule Take 1 capsule with 75 mg capsule daily (total dose 225 mg daily) 90 capsule 0  . atorvastatin (LIPITOR) 20 MG tablet TAKE 1 TABLET BY MOUTH EVERY DAY 90 tablet 2  . levothyroxine (SYNTHROID) 50 MCG tablet TAKE 1 TABLET BY MOUTH EVERY DAY 90 tablet 1   No facility-administered medications prior to visit.     Per HPI unless specifically indicated in ROS section below Review of Systems  Constitutional: Negative for activity change, appetite change, chills, fatigue, fever and unexpected weight change.  HENT: Negative for hearing loss.   Eyes: Negative for visual disturbance.  Respiratory: Negative for cough, chest tightness, shortness of breath and wheezing.   Cardiovascular: Negative for chest pain, palpitations and leg swelling.  Gastrointestinal: Positive for diarrhea. Negative for abdominal distention, abdominal pain, blood in stool, constipation, nausea and vomiting.  Genitourinary: Positive for urgency. Negative for difficulty urinating and hematuria.  Musculoskeletal: Negative for arthralgias, myalgias and neck pain.  Skin: Negative for rash.  Neurological: Positive for dizziness. Negative for seizures, syncope and headaches.  Hematological: Negative for adenopathy. Does not bruise/bleed easily.  Psychiatric/Behavioral: Positive for dysphoric mood. The patient is nervous/anxious.        Sees psychiatry   Objective:  BP 118/70   Pulse (!) 102   Temp 97.8 F (36.6 C) (Temporal)   Ht _0  (1.626 m)   Wt 195 lb 9 oz (88.7 kg)   SpO2 95%   BMI 33.57 kg/m   Wt Readings from Last 3 Encounters:  09/12/20 195 lb 9 oz (88.7 kg)  03/15/20 202 lb (91.6 kg)  01/03/20 201 lb (91.2 kg)      Physical Exam Vitals and nursing note reviewed.  Constitutional:      General: She is not in acute  distress.    Appearance: Normal appearance. She is well-developed. She is not ill-appearing.  HENT:     Head: Normocephalic and atraumatic.     Right Ear: Hearing, tympanic membrane, ear canal and external ear normal.     Left Ear: Hearing, tympanic membrane, ear canal and external ear normal.  Eyes:     General: No scleral icterus.    Extraocular Movements: Extraocular movements intact.     Conjunctiva/sclera: Conjunctivae normal.     Pupils: Pupils are equal, round, and reactive to light.  Neck:     Thyroid: No thyroid mass or thyromegaly.     Vascular: No carotid bruit.  Cardiovascular:     Rate and Rhythm: Normal rate and regular rhythm.     Pulses: Normal pulses.          Radial pulses are 2+ on the right side  and 2+ on the left side.     Heart sounds: Normal heart sounds. No murmur heard.   Pulmonary:     Effort: Pulmonary effort is normal. No respiratory distress.     Breath sounds: Normal breath sounds. No wheezing, rhonchi or rales.  Abdominal:     General: Bowel sounds are normal. There is no distension.     Palpations: Abdomen is soft. There is no mass.     Tenderness: There is no abdominal tenderness. There is no guarding or rebound.     Hernia: No hernia is present.  Musculoskeletal:        General: Normal range of motion.     Cervical back: Normal range of motion and neck supple.     Right lower leg: No edema.     Left lower leg: No edema.     Comments:  No midline lower back pain L shoulder WNL FROM R elbow flexion/extension  R shoulder exam: No deformity of shoulders on inspection. No significant pain with palpation of shoulder landmarks. FROM in abduction and forward flexion. No significant pain or weakness with testing SITS in ext/int rotation. No pain with empty can sign. Neg Speed test. No impingement. No pain with rotation of humeral head in [facility] joint.   Lymphadenopathy:     Cervical: No cervical adenopathy.  Skin:    General: Skin is warm and  dry.     Findings: No rash.  Neurological:     General: No focal deficit present.     Mental Status: She is alert and oriented to person, place, and time.     Comments: CN grossly intact, station and gait intact  Psychiatric:        Mood and Affect: Mood normal.        Behavior: Behavior normal.        Thought Content: Thought content normal.        Judgment: Judgment normal.       Results for orders placed or performed in visit on 09/05/20  Hepatitis C antibody  Result Value Ref Range   Hepatitis C Ab NON-REACTIVE NON-REACTIVE   SIGNAL TO CUT-OFF 0.00 <1.00  VITAMIN D 25 Hydroxy (Vit-D Deficiency, Fractures)  Result Value Ref Range   VITD 26.21 (L) 30.00 - 100.00 ng/mL  Vitamin B12  Result Value Ref Range   Vitamin B-12 171 (L) 211 - 911 pg/mL  CBC with Differential/Platelet  Result Value Ref Range   WBC 5.9 4.0 - 10.5 K/uL   RBC 3.60 (L) 3.87 - 5.11 Mil/uL   Hemoglobin 11.7 (L) 12.0 - 15.0 g/dL   HCT 35.2 (L) 36.0 - 46.0 %   MCV 97.7 78.0 - 100.0 fl   MCHC 33.4 30.0 - 36.0 g/dL   RDW 14.3 11.5 - 15.5 %   Platelets 252.0 150.0 - 400.0 K/uL   Neutrophils Relative % 53.9 43.0 - 77.0 %   Lymphocytes Relative 33.7 12.0 - 46.0 %   Monocytes Relative 9.2 3.0 - 12.0 %   Eosinophils Relative 2.6 0.0 - 5.0 %   Basophils Relative 0.6 0.0 - 3.0 %   Neutro Abs 3.2 1.4 - 7.7 K/uL   Lymphs Abs 2.0 0.7 - 4.0 K/uL   Monocytes Absolute 0.5 0.1 - 1.0 K/uL   Eosinophils Absolute 0.2 0.0 - 0.7 K/uL   Basophils Absolute 0.0 0.0 - 0.1 K/uL  TSH  Result Value Ref Range   TSH 2.27 0.35 - 4.50 uIU/mL  Hemoglobin A1c  Result Value  Ref Range   Hgb A1c MFr Bld 6.0 4.6 - 6.5 %  Comprehensive metabolic panel  Result Value Ref Range   Sodium 141 135 - 145 mEq/L   Potassium 4.4 3.5 - 5.1 mEq/L   Chloride 105 96 - 112 mEq/L   CO2 25 19 - 32 mEq/L   Glucose, Bld 89 70 - 99 mg/dL   BUN 23 6 - 23 mg/dL   Creatinine, Ser 1.52 (H) 0.40 - 1.20 mg/dL   Total Bilirubin 0.4 0.2 - 1.2 mg/dL    Alkaline Phosphatase 93 39 - 117 U/L   AST 18 0 - 37 U/L   ALT 12 0 - 35 U/L   Total Protein 6.5 6.0 - 8.3 g/dL   Albumin 4.1 3.5 - 5.2 g/dL   GFR 32.78 (L) >60.00 mL/min   Calcium 9.1 8.4 - 10.5 mg/dL  Lipid panel  Result Value Ref Range   Cholesterol 214 (H) 0 - 200 mg/dL   Triglycerides 192.0 (H) 0.0 - 149.0 mg/dL   HDL 75.60 >39.00 mg/dL   VLDL 38.4 0.0 - 40.0 mg/dL   LDL Cholesterol 100 (H) 0 - 99 mg/dL   Total CHOL/HDL Ratio 3    NonHDL 138.77    Assessment & Plan:  This visit occurred during the SARS-CoV-2 public health emergency.  Safety protocols were in place, including screening questions prior to the visit, additional usage of staff PPE, and extensive cleaning of exam room while observing appropriate contact time as indicated for disinfecting solutions.   Problem List Items Addressed This Visit    HLD (hyperlipidemia)    Chronic, improving on atorvastatin 36m daily - continue.  The 10-year ASCVD risk score (Mikey BussingDC JBrooke Bonito, et al., 2013) is: 17.6%   Values used to calculate the score:     Age: [age]years     Sex: Female     Is Non-Hispanic African American: No     Diabetic: No     Tobacco smoker: No     Systolic Blood Pressure: 1935mmHg     Is BP treated: No     HDL Cholesterol: 75.6 mg/dL     Total Cholesterol: 214 mg/dL       Relevant Medications   atorvastatin (LIPITOR) 20 MG tablet   Anemia    Recurrent, no longer macrocytic.  Check iFOB and UA today.  Recheck CBC at f/u visit.       Relevant Medications   Cyanocobalamin (B-12) 1000 MCG SUBL   Other Relevant Orders   Ferritin   IBC panel   MDD (major depressive disorder), recurrent episode, severe (HHaigler    Regularly sees psychiatrist, on abilify, xanax, effexor, welbutrin.  Regularly sees counselor as well.      Advanced care planning/counseling discussion    Advanced directives - living will at home. Would want husband to be HCPOA. Will bring me copy.       Health maintenance examination - Primary     Preventative protocols reviewed and updated unless pt declined. Discussed healthy diet and lifestyle.       Vitamin D deficiency    rec restart 2000 IU daily.       Hypothyroidism    Chronic, stable on current regimen.      Relevant Medications   levothyroxine (SYNTHROID) 50 MCG tablet   Prediabetes    Reviewed with patient. Rec limit added sugars in diet.       History of uterine cancer    S/p abdominal hysterectomy and radiation.  Sees GYN Q2 years.       Urinary incontinence    Ongoing trouble. Has previously seen PFPT with benefit as well as completed fall prevention program.  Wears depends regularly.  Now more urge incontinence symptoms.  Check UA, consider myrbetriq.       Falls    Completed balance training/fall prevention PT program.       Vitamin B12 deficiency    Markedly low - provided b12 shot today, then rec start 1028mg b12 daily.  Recheck levels next labwork.       Relevant Orders   Vitamin B12   Right shoulder pain    After recent falls x2.  Describes RTC injury/tendonitis Exam largely benign, not consistent with humeral fracture.  States slowly improving each day.  Advised let u[place]now if recurrent or worsening to consider sports medicine evaluation.       Renal insufficiency    Newly noted - a change since 2 yrs when last checked.  Discussed with patient as well as importance of good hydration status. Recommend increase water intake by 2-3 8oz glasses a day, will recheck levels when she returns in 2 months for f/u.       Relevant Orders   Renal function panel   CBC with Differential/Platelet   Serum protein electrophoresis with reflex       Meds ordered this encounter  Medications  . atorvastatin (LIPITOR) 20 MG tablet    Sig: Take 1 tablet (20 mg total) by mouth daily.    Dispense:  90 tablet    Refill:  3  . levothyroxine (SYNTHROID) 50 MCG tablet    Sig: Take 1 tablet (50 mcg total) by mouth daily.    Dispense:  90 tablet     Refill:  3  . Cyanocobalamin (B-12) 1000 MCG SUBL    Sig: Place 1 tablet under the tongue daily.  . cyanocobalamin ((VITAMIN B-12)) injection 1,000 mcg   Orders Placed This Encounter  Procedures  . Vitamin B12    Standing Status:   Future    Standing Expiration Date:   09/13/2021  . Ferritin    Standing Status:   Future    Standing Expiration Date:   09/13/2021  . IBC panel    Standing Status:   Future    Standing Expiration Date:   09/13/2021  . Renal function panel    Standing Status:   Future    Standing Expiration Date:   09/13/2021  . CBC with Differential/Platelet    Standing Status:   Future    Standing Expiration Date:   09/13/2021  . Serum protein electrophoresis with reflex    Standing Status:   Future    Standing Expiration Date:   09/13/2021    Patient instructions: Call to schedule mammogram at your convenience: [phone] [place]opy of advanced directive/living will.  Restart vitamin B12 1000 mcg daily. B12 shot today.  Restart vitamin D3 1000-2000 units daily.  Kidney function was worse today - increase water intake by 2-3 8oz glasses a day.  Pass by lab for stool kit and urinalysis today.  Return in 2-3 months for lab visit to check blood work and afterwards for office visit to review.  Return in 1 year for next wellness/physical.   Follow up plan: Return in about 2 months (around 11/12/2020) for annual exam, prior fasting for blood work, medicare wellness visit, follow up visit.  JRia Bush MD

## 2020-09-13 DIAGNOSIS — N289 Disorder of kidney and ureter, unspecified: Secondary | ICD-10-CM | POA: Insufficient documentation

## 2020-09-13 NOTE — Assessment & Plan Note (Signed)
Chronic, improving on atorvastatin 20mg  daily - continue.  The 10-year ASCVD risk score Mikey Bussing DC Brooke Bonito., et al., 2013) is: 17.6%   Values used to calculate the score:     Age: 80 years     Sex: Female     Is Non-Hispanic African American: No     Diabetic: No     Tobacco smoker: No     Systolic Blood Pressure: 032 mmHg     Is BP treated: No     HDL Cholesterol: 75.6 mg/dL     Total Cholesterol: 214 mg/dL

## 2020-09-13 NOTE — Assessment & Plan Note (Addendum)
Newly noted - a change since 2 yrs when last checked.  Discussed with patient as well as importance of good hydration status. Recommend increase water intake by 2-3 8oz glasses a day, will recheck levels when she returns in 2 months for f/u.

## 2020-09-13 NOTE — Assessment & Plan Note (Signed)
Ongoing trouble. Has previously seen PFPT with benefit as well as completed fall prevention program.  Wears depends regularly.  Now more urge incontinence symptoms.  Check UA, consider myrbetriq.

## 2020-09-13 NOTE — Assessment & Plan Note (Addendum)
Reviewed with patient. Rec limit added sugars in diet.

## 2020-09-13 NOTE — Assessment & Plan Note (Signed)
rec restart 2000 IU daily. 

## 2020-09-13 NOTE — Assessment & Plan Note (Addendum)
Recurrent, no longer macrocytic.  Check iFOB and UA today.  Recheck CBC at f/u visit.

## 2020-09-13 NOTE — Assessment & Plan Note (Signed)
Completed balance training/fall prevention PT program.

## 2020-09-13 NOTE — Assessment & Plan Note (Signed)
Markedly low - provided b12 shot today, then rec start 1042mcg b12 daily.  Recheck levels next labwork.

## 2020-09-13 NOTE — Assessment & Plan Note (Signed)
Chronic, stable on current regimen.  

## 2020-09-13 NOTE — Assessment & Plan Note (Addendum)
Regularly sees psychiatrist, on abilify, xanax, effexor, welbutrin.  Regularly sees counselor as well.

## 2020-09-13 NOTE — Assessment & Plan Note (Signed)
After recent falls x2.  Describes RTC injury/tendonitis Exam largely benign, not consistent with humeral fracture.  States slowly improving each day.  Advised let us know if recurrent or worsening to consider sports medicine evaluation.

## 2020-09-13 NOTE — Assessment & Plan Note (Signed)
S/p abdominal hysterectomy and radiation. Sees GYN Q2 years.

## 2020-09-26 ENCOUNTER — Other Ambulatory Visit: Payer: Self-pay

## 2020-09-26 ENCOUNTER — Ambulatory Visit (INDEPENDENT_AMBULATORY_CARE_PROVIDER_SITE_OTHER): Payer: Medicare PPO | Admitting: Licensed Clinical Social Worker

## 2020-09-26 DIAGNOSIS — F3342 Major depressive disorder, recurrent, in full remission: Secondary | ICD-10-CM

## 2020-09-26 DIAGNOSIS — F419 Anxiety disorder, unspecified: Secondary | ICD-10-CM | POA: Diagnosis not present

## 2020-09-26 NOTE — Progress Notes (Signed)
Virtual Visit via Audio Note  I connected with Faith Santiago on 09/26/20 at  4:00 PM EDT by an audio enabled telemedicine application and verified that I am speaking with the correct person using two identifiers.  Location: Patient: home Provider: remote office [place], [place])   I discussed the limitations of evaluation and management by telemedicine and the availability of in person appointments. The patient expressed understanding and agreed to proceed.   I discussed the assessment and treatment plan with the patient. The patient was provided an opportunity to ask questions and all were answered. The patient agreed with the plan and demonstrated an understanding of the instructions.   The patient was advised to call back or seek an in-person evaluation if the symptoms worsen or if the condition fails to improve as anticipated.  I provided 20 minutes of non-face-to-face time during this encounter.   Faith Santiago R Faith Dingee, LCSW   THERAPIST PROGRESS NOTE  Session Time: 4-4:20p  Participation Level: Active  Behavioral Response: NAAlertDepressed  Type of Therapy: Individual Therapy  Treatment Goals addressed: Coping  Interventions: CBT and Supportive  Summary: Faith Santiago is a 80 y.o. female who presents with improving symptoms related to depression and anxiety diagnosis. Faith Santiago reports that overall mood has been stable--pt rates it a 4 out of 10 with 10 being the happiest. When asked to clarify what would make it more like an 8 or 9, pt states "trust me, a 4 is an improvement".  Pt reports that she feels she is managing overall stress and anxiety well. Pt reports getting good quality and quantity of sleep.  Allowed pt to explore and express thoughts and feelings associated with recent life situations and external stressors. Pt reports that recently she has been engaged in more recreational activity and feeling happy. Pt reports that she has enough energy to start on quilting  project that she has been wanting to work on for a while. Pt is also reporting an increase in falls both inside the home and outside of the home.  Did a Conservator, museum/gallery (house/yard) with pt and assisted pt with identifying potential obstacles and hazards within the home and yard areas. Pt reports that she is trying to be more careful with her walking, shoes she is wearing; etc  Continued recommendations are as follows: self care behaviors, positive social engagements, focusing on overall work/home/life balance, and focusing on positive physical and emotional wellness.      Suicidal/Homicidal: No  Therapist Response: Faith Santiago is being intentional about engaging in recreational activities that reflect increased energy and interest. Faith Santiago is being an active participant in social/hobby groups. Faith Santiago is actively trying to problem solve and is breaking down larger projects in to smaller, more manageable projects. This is reflective of personal growth and progress. Treatment to continue  Plan: Return again in 4 weeks.  Diagnosis: Axis I: MDD, recurrent, remission; GAD    Axis II: No diagnosis    Rachel Bo Christyan Reger, LCSW 09/26/2020

## 2020-10-06 ENCOUNTER — Other Ambulatory Visit: Payer: Medicare PPO

## 2020-10-06 DIAGNOSIS — R32 Unspecified urinary incontinence: Secondary | ICD-10-CM | POA: Diagnosis not present

## 2020-10-08 LAB — URINALYSIS W MICROSCOPIC + REFLEX CULTURE
Bilirubin Urine: NEGATIVE
Glucose, UA: NEGATIVE
Hgb urine dipstick: NEGATIVE
Hyaline Cast: NONE SEEN /LPF
Ketones, ur: NEGATIVE
Nitrites, Initial: POSITIVE — AB
RBC / HPF: NONE SEEN /HPF (ref 0–2)
Specific Gravity, Urine: 1.017 (ref 1.001–1.035)
pH: >=8.5 — AB (ref 5.0–8.0)

## 2020-10-08 LAB — URINE CULTURE
MICRO NUMBER:: 12051079
SPECIMEN QUALITY:: ADEQUATE

## 2020-10-08 LAB — CULTURE INDICATED

## 2020-10-10 ENCOUNTER — Other Ambulatory Visit (INDEPENDENT_AMBULATORY_CARE_PROVIDER_SITE_OTHER): Payer: Medicare PPO

## 2020-10-10 ENCOUNTER — Other Ambulatory Visit: Payer: Self-pay | Admitting: Family Medicine

## 2020-10-10 DIAGNOSIS — Z1211 Encounter for screening for malignant neoplasm of colon: Secondary | ICD-10-CM

## 2020-10-10 LAB — FECAL OCCULT BLOOD, GUAIAC: Fecal Occult Blood: NEGATIVE

## 2020-10-10 LAB — FECAL OCCULT BLOOD, IMMUNOCHEMICAL: Fecal Occult Bld: NEGATIVE

## 2020-10-11 ENCOUNTER — Encounter: Payer: Self-pay | Admitting: Family Medicine

## 2020-10-25 ENCOUNTER — Ambulatory Visit: Payer: Medicare PPO | Admitting: Family Medicine

## 2020-11-09 ENCOUNTER — Other Ambulatory Visit: Payer: Self-pay

## 2020-11-09 ENCOUNTER — Ambulatory Visit (INDEPENDENT_AMBULATORY_CARE_PROVIDER_SITE_OTHER): Payer: Medicare PPO | Admitting: Licensed Clinical Social Worker

## 2020-11-09 DIAGNOSIS — F3342 Major depressive disorder, recurrent, in full remission: Secondary | ICD-10-CM

## 2020-11-09 DIAGNOSIS — F419 Anxiety disorder, unspecified: Secondary | ICD-10-CM

## 2020-11-09 DIAGNOSIS — F4321 Adjustment disorder with depressed mood: Secondary | ICD-10-CM | POA: Diagnosis not present

## 2020-11-09 NOTE — Progress Notes (Signed)
Virtual Visit via Audio Note  I connected with Faith Santiago on 11/09/20 at  3:00 PM EDT by an audio enabled telemedicine application and verified that I am speaking with the correct person using two identifiers.  Location: Patient: home Provider: remote office [place], [place])   I discussed the limitations of evaluation and management by telemedicine and the availability of in person appointments. The patient expressed understanding and agreed to proceed.  I discussed the assessment and treatment plan with the patient. The patient was provided an opportunity to ask questions and all were answered. The patient agreed with the plan and demonstrated an understanding of the instructions.   The patient was advised to call back or seek an in-person evaluation if the symptoms worsen or if the condition fails to improve as anticipated.  I provided 45 minutes of non-face-to-face time during this encounter.   Cane Dubray R Johnwilliam Shepperson, LCSW   THERAPIST PROGRESS NOTE  Session Time: 3-3:45p  Participation Level: Active  Behavioral Response: NAAlertAnxious and Depressed  Type of Therapy: Individual Therapy  Treatment Goals addressed: Anxiety and Diagnosis: depression  Interventions: CBT  Summary: Faith Santiago is a 79 y.o. female who presents with improving symptoms related to depression diagnosis. Patient reports that overall mood is stable and that she is managing anxiety and stress levels well. Patient reports that she recently had a fall when she went to get her haircut, and felt embarrassed afterwards. Patient reports that's the first time she has fallen in several months. Patient reports that the fall was a slight set back, and that she hasn't left her house since it happened. Patient is trying to stay out of the hot sun. Patient reports that overall sleep quality and quantity is good, and patient feels her overall health conditions are good. Patient states she goes to bed around 10:00 or  11:00 o'clock at night and usually gets up around 8:00 o'clock in the morning. Patient states that other people feel she's sleeping too much, but she feels OK about it. Allowed patients a space to explore and express thoughts and feelings associated with recent external stressors and life events. Patient reports that she is feeling distress over household chores that have not been completed. Discussed organization of tasks, and reinforcing undesired activities with desired activities. Patient reports that she spends a lot of time reading, so that is her preferred activity. Discussed using reading as a reinforcer to other tasks that patient may feel less motivated to complete. Continued recommendations are as follows: self care behaviors, positive social engagements, focusing on overall work/home/life balance, and focusing on positive physical and emotional wellness.    Suicidal/Homicidal: No  Therapist Response: Faith Santiago is being intentional about engaging in recreational activities that reflect increased energy and interest. Faith Santiago is being an active participant in social/hobby groups. Faith Santiago is actively trying to problem solve and is breaking down larger projects in to smaller, more manageable projects. This is reflective of personal growth and progress. Treatment to continue  Plan: Return again in 4 weeks.  Diagnosis: Axis I: MDD, recurrent; anxiety; bereavement    Axis II: No diagnosis    Rachel Bo Klay Sobotka, LCSW 11/09/2020

## 2020-11-13 ENCOUNTER — Encounter: Payer: Self-pay | Admitting: Family Medicine

## 2020-11-13 ENCOUNTER — Other Ambulatory Visit: Payer: Self-pay

## 2020-11-13 ENCOUNTER — Ambulatory Visit: Payer: Medicare PPO | Admitting: Family Medicine

## 2020-11-13 VITALS — BP 110/80 | HR 98 | Temp 97.6°F | Ht 64.0 in | Wt 200.4 lb

## 2020-11-13 DIAGNOSIS — E538 Deficiency of other specified B group vitamins: Secondary | ICD-10-CM | POA: Diagnosis not present

## 2020-11-13 DIAGNOSIS — R2689 Other abnormalities of gait and mobility: Secondary | ICD-10-CM | POA: Insufficient documentation

## 2020-11-13 DIAGNOSIS — N289 Disorder of kidney and ureter, unspecified: Secondary | ICD-10-CM

## 2020-11-13 DIAGNOSIS — D649 Anemia, unspecified: Secondary | ICD-10-CM

## 2020-11-13 DIAGNOSIS — W19XXXD Unspecified fall, subsequent encounter: Secondary | ICD-10-CM | POA: Diagnosis not present

## 2020-11-13 LAB — CBC WITH DIFFERENTIAL/PLATELET
Basophils Absolute: 0 10*3/uL (ref 0.0–0.1)
Basophils Relative: 0.9 % (ref 0.0–3.0)
Eosinophils Absolute: 0.2 10*3/uL (ref 0.0–0.7)
Eosinophils Relative: 3.9 % (ref 0.0–5.0)
HCT: 35.1 % — ABNORMAL LOW (ref 36.0–46.0)
Hemoglobin: 11.6 g/dL — ABNORMAL LOW (ref 12.0–15.0)
Lymphocytes Relative: 19.7 % (ref 12.0–46.0)
Lymphs Abs: 1.1 10*3/uL (ref 0.7–4.0)
MCHC: 33.1 g/dL (ref 30.0–36.0)
MCV: 99.8 fl (ref 78.0–100.0)
Monocytes Absolute: 0.6 10*3/uL (ref 0.1–1.0)
Monocytes Relative: 11.2 % (ref 3.0–12.0)
Neutro Abs: 3.7 10*3/uL (ref 1.4–7.7)
Neutrophils Relative %: 64.3 % (ref 43.0–77.0)
Platelets: 252 10*3/uL (ref 150.0–400.0)
RBC: 3.52 Mil/uL — ABNORMAL LOW (ref 3.87–5.11)
RDW: 14 % (ref 11.5–15.5)
WBC: 5.7 10*3/uL (ref 4.0–10.5)

## 2020-11-13 LAB — FERRITIN: Ferritin: 98.3 ng/mL (ref 10.0–291.0)

## 2020-11-13 LAB — RENAL FUNCTION PANEL
Albumin: 4 g/dL (ref 3.5–5.2)
BUN: 27 mg/dL — ABNORMAL HIGH (ref 6–23)
CO2: 27 mEq/L (ref 19–32)
Calcium: 9.3 mg/dL (ref 8.4–10.5)
Chloride: 105 mEq/L (ref 96–112)
Creatinine, Ser: 1.43 mg/dL — ABNORMAL HIGH (ref 0.40–1.20)
GFR: 35.22 mL/min — ABNORMAL LOW (ref >60.00)
Glucose, Bld: 103 mg/dL — ABNORMAL HIGH (ref 70–99)
Phosphorus: 3 mg/dL (ref 2.3–4.6)
Potassium: 4.8 mEq/L (ref 3.5–5.1)
Sodium: 140 mEq/L (ref 135–145)

## 2020-11-13 LAB — FOLATE: Folate: 22.2 ng/mL (ref >5.9)

## 2020-11-13 LAB — VITAMIN B12: Vitamin B-12: 262 pg/mL (ref 211–911)

## 2020-11-13 LAB — IBC PANEL
Iron: 82 ug/dL (ref 42–145)
Saturation Ratios: 24.7 % (ref 20.0–50.0)
Transferrin: 237 mg/dL (ref 212.0–360.0)

## 2020-11-13 MED ORDER — CYANOCOBALAMIN 1000 MCG/ML IJ SOLN
1000.0000 ug | Freq: Once | INTRAMUSCULAR | Status: AC
  Administered 2020-11-13: 1000 ug via INTRAMUSCULAR

## 2020-11-13 NOTE — Assessment & Plan Note (Addendum)
Recurrent falls - will refer back to outpatient PT for balance training/fall prevention program. Last saw [facility] PT 09/2018. See below.

## 2020-11-13 NOTE — Patient Instructions (Addendum)
Call to schedule mammogram at your convenience: [place] [phone] Labs today then B12 shot.  I would like to refer you back to physical therapy as well as neurology evaluation. Consider starting to use a cane, touch base with PT about this. Keep me updated, let me know if recurrent falls.

## 2020-11-13 NOTE — Assessment & Plan Note (Signed)
Update labs today. She has slowly increased water intake - now drinking about 16 oz/day - still too low.

## 2020-11-13 NOTE — Assessment & Plan Note (Signed)
With frequent ongoing falls. With smaller steps and possible cogwheeling on exam will refer to neurology for r/o parkinsonisms.

## 2020-11-13 NOTE — Assessment & Plan Note (Signed)
She has been taking MVI - will update b12 levels then provide another shot. rec start individial oral b12 1060mg daily.

## 2020-11-13 NOTE — Progress Notes (Signed)
Patient ID: Faith Santiago, female    DOB: 1940/09/12, 80 y.o.   MRN: [id number]  This visit was conducted in person.  BP 110/80   Pulse 98   Temp 97.6 F (36.4 C) (Temporal)   Ht '5\' 4"'$  (1.626 m)   Wt 200 lb 6 oz (90.9 kg)   SpO2 95%   BMI 34.39 kg/m    CC: 2 mo f/u visit  Subjective:   HPI: Faith Santiago is a 80 y.o. female presenting on 11/13/2020 for Follow-up (Here for 2 mo f/u and labs. )   See prior note for details.  Seen here last visit for CPE - at that time endorsed several falls in setting of B12 deficiency as well as worsening kidney function. She had previously completed balance training PT. She had another fall 2 wks ago while going to get hair cut. Denies premonitory symptoms or syncope. Notes difficulty navigating turns - falls seem to happen when she's trying to turn.   She has increased water intake - to at least 16 oz/day. She has cut down on pepsi.  No tremors or shaking, stiffness, or memory trouble.  Voiding well. No abd pain, nausea, leg or ankle swelling.   Low B12 vitamin - she's been taking MVI not specific b12.   Still needs to schedule mammogram - # provided again today.      Relevant past medical, surgical, family and social history reviewed and updated as indicated. Interim medical history since our last visit reviewed. Allergies and medications reviewed and updated. Outpatient Medications Prior to Visit  Medication Sig Dispense Refill   ALPRAZolam (XANAX) 0.25 MG tablet Take 1 tablet (0.25 mg total) by mouth at bedtime as needed for anxiety. 30 tablet 2   ARIPiprazole (ABILIFY) 5 MG tablet Take 1 tablet (5 mg total) by mouth daily. 90 tablet 0   atorvastatin (LIPITOR) 20 MG tablet Take 1 tablet (20 mg total) by mouth daily. 90 tablet 3   buPROPion (WELLBUTRIN XL) 150 MG 24 hr tablet Take 1 tablet (150 mg total) by mouth in the morning. 90 tablet 0   Cholecalciferol (VITAMIN D) 50 MCG (2000 UT) CAPS Take 1 capsule (2,000 Units total) by mouth  daily. 30 capsule    Cyanocobalamin (B-12) 1000 MCG SUBL Place 1 tablet under the tongue daily.     levothyroxine (SYNTHROID) 50 MCG tablet Take 1 tablet (50 mcg total) by mouth daily. 90 tablet 3   omeprazole (PRILOSEC) 40 MG capsule Take 1 capsule (40 mg total) by mouth 2 (two) times daily before a meal. 180 capsule 3   venlafaxine XR (EFFEXOR XR) 75 MG 24 hr capsule Take 1 capsule with 150 mg capsule daily 90 capsule 0   venlafaxine XR (EFFEXOR-XR) 150 MG 24 hr capsule Take 1 capsule with 75 mg capsule daily (total dose 225 mg daily) 90 capsule 0   No facility-administered medications prior to visit.     Per HPI unless specifically indicated in ROS section below Review of Systems  Objective:  BP 110/80   Pulse 98   Temp 97.6 F (36.4 C) (Temporal)   Ht '5\' 4"'$  (1.626 m)   Wt 200 lb 6 oz (90.9 kg)   SpO2 95%   BMI 34.39 kg/m   Wt Readings from Last 3 Encounters:  11/13/20 200 lb 6 oz (90.9 kg)  09/12/20 195 lb 9 oz (88.7 kg)  03/15/20 202 lb (91.6 kg)      Physical Exam Vitals and nursing  note reviewed.  Constitutional:      Appearance: Normal appearance. She is not ill-appearing.  Cardiovascular:     Rate and Rhythm: Normal rate and regular rhythm.     Pulses: Normal pulses.     Heart sounds: Normal heart sounds. No murmur heard. Pulmonary:     Effort: Pulmonary effort is normal. No respiratory distress.     Breath sounds: Normal breath sounds. No wheezing, rhonchi or rales.  Neurological:     Mental Status: She is alert.     Sensory: Sensation is intact.     Comments:  CN grossly intact Diminished strength BLE ~4/5 Gait with smaller steps  Cogwheel rigidity to BUE  Minimal postural tremor,  no resting tremor present  Psychiatric:        Mood and Affect: Mood normal.        Behavior: Behavior normal.     Comments: Somewhat masked facies       Results for orders placed or performed in visit on 10/11/20  Fecal Occult Blood, Guaiac  Result Value Ref Range    Fecal Occult Blood Negative     Assessment & Plan:  This visit occurred during the SARS-CoV-2 public health emergency.  Safety protocols were in place, including screening questions prior to the visit, additional usage of staff PPE, and extensive cleaning of exam room while observing appropriate contact time as indicated for disinfecting solutions.   Problem List Items Addressed This Visit     Anemia    Update CBC, folate, b12. See below.        Falls    Recurrent falls - will refer back to outpatient PT for balance training/fall prevention program. Last saw [facility] PT 09/2018. See below.        Relevant Orders   Ambulatory referral to Physical Therapy   Ambulatory referral to Neurology   Vitamin B12 deficiency    She has been taking MVI - will update b12 levels then provide another shot. rec start individial oral b12 1064mg daily.        Relevant Orders   Folate   Renal insufficiency    Update labs today. She has slowly increased water intake - now drinking about 16 oz/day - still too low.       Imbalance - Primary    With frequent ongoing falls. With smaller steps and possible cogwheeling on exam will refer to neurology for r/o parkinsonisms.        Relevant Orders   Ambulatory referral to Physical Therapy   Ambulatory referral to Neurology     Meds ordered this encounter  Medications   cyanocobalamin ((VITAMIN B-12)) injection 1,000 mcg    Orders Placed This Encounter  Procedures   Folate   Ambulatory referral to Physical Therapy    Referral Priority:   Routine    Referral Type:   Physical Medicine    Referral Reason:   Specialty Services Required    Requested Specialty:   Physical Therapy    Number of Visits Requested:   1   Ambulatory referral to Neurology    Referral Priority:   Routine    Referral Type:   Consultation    Referral Reason:   Specialty Services Required    Requested Specialty:   Neurology    Number of Visits Requested:   1    Patient  Instructions  Call to schedule mammogram at your convenience: [facility]([phone] today then B12 shot.  I would like  to refer you back to physical therapy as well as neurology evaluation. Consider starting to use a cane, touch base with PT about this. Keep me updated, let me know if recurrent falls.   Follow up plan: Return if symptoms worsen or fail to improve.  [name], MD

## 2020-11-13 NOTE — Assessment & Plan Note (Signed)
Update CBC, folate, b12. See below.

## 2020-11-14 ENCOUNTER — Encounter (HOSPITAL_COMMUNITY): Payer: Self-pay | Admitting: Psychiatry

## 2020-11-14 ENCOUNTER — Ambulatory Visit (INDEPENDENT_AMBULATORY_CARE_PROVIDER_SITE_OTHER): Payer: Medicare PPO | Admitting: Psychiatry

## 2020-11-14 DIAGNOSIS — F3342 Major depressive disorder, recurrent, in full remission: Secondary | ICD-10-CM | POA: Diagnosis not present

## 2020-11-14 DIAGNOSIS — F419 Anxiety disorder, unspecified: Secondary | ICD-10-CM | POA: Diagnosis not present

## 2020-11-14 MED ORDER — VENLAFAXINE HCL ER 150 MG PO CP24: ORAL_CAPSULE | ORAL | 0 refills | Status: DC

## 2020-11-14 MED ORDER — BUPROPION HCL ER (XL) 150 MG PO TB24: 150.0000 mg | ORAL_TABLET | Freq: Every morning | ORAL | 0 refills | Status: DC

## 2020-11-14 MED ORDER — ALPRAZOLAM 0.25 MG PO TABS: 0.2500 mg | ORAL_TABLET | Freq: Every evening | ORAL | 2 refills | Status: AC | PRN

## 2020-11-14 MED ORDER — VENLAFAXINE HCL ER 75 MG PO CP24: ORAL_CAPSULE | ORAL | 0 refills | Status: DC

## 2020-11-14 MED ORDER — ARIPIPRAZOLE 5 MG PO TABS: 5.0000 mg | ORAL_TABLET | Freq: Every day | ORAL | 0 refills | Status: DC

## 2020-11-14 NOTE — Progress Notes (Signed)
BH MD/PA/NP OP Progress Note  11/14/2020 3:29 PM Faith Santiago  MRN:  [id number]  Chief Complaint: "  I have been fine but I have been falling a lot" Chief Complaint   Medication Management    HPI: 80 year old female seen today for follow-up psychiatric evaluation.  She is a former patient of Dr. Jean Rosenthal is being transferred to writer for medication management.  She has a psychiatric history of depression and anxiety.  Currently she is managed on Abilify 5 mg daily, Xanax 0.25 mg nightly at bedtime, Wellbutrin 150 mg daily, and Effexor 225 mg nightly.  She notes her medications are effective in managing her psychiatric condition.  Today she is well-groomed, pleasant, cooperative, and engaged in conversation.  She informed Probation officer that since her last visit things have been going well.  She however informed Probation officer that she has been falling  more frequently.  She notes that she followed up with her primary care doctor who is referring her to physical therapy as well as neurology.  Provider informed patient that Xanax could cause dizziness and contribute to her falling.  At this time Xanax not discontinued however provider noted that it may be in the future if her falling spells worsened.  She endorsed understanding and agreed.   Patient informed Probation officer that she she continues to have minimal anxiety and depression and notes that her mood is overall stable.  Today provider conducted a GAD-7 and patient scored an 11.  Provider also conducted a PHQ-9 and patient scored a 7.  She endorsed adequate sleep and appetite.  She notes that since December she has lost 15 pounds.  She notes that this was purposeful and she was dieting.  Today she denies SI/HI/VAH, mania, or paranoia.  No medication changes made today.  Patient agreeable to continue medications as prescribed.  Patient continues to follow Xanax will be discontinued.  She will follow-up with outpatient counseling for therapy.  No other concerns at this  time. Visit Diagnosis:    ICD-10-CM   1. Anxiety  F41.9 ALPRAZolam (XANAX) 0.25 MG tablet    2. MDD (major depressive disorder), recurrent, in full remission ([place])  F33.42 ARIPiprazole (ABILIFY) 5 MG tablet    buPROPion (WELLBUTRIN XL) 150 MG 24 hr tablet    venlafaxine XR (EFFEXOR-XR) 150 MG 24 hr capsule    venlafaxine XR (EFFEXOR XR) 75 MG 24 hr capsule      Past Psychiatric History: Anxiety and depression  Past Medical History:  Past Medical History:  Diagnosis Date   Anemia, unspecified    Anxiety    Arthritis    hands R>L, neck and lower back   Bimalleolar fracture of left ankle 11/19/2013   [place]   Cataract    COVID-19 05/2019   Depression    Esophageal reflux    GERD with stricture 06/03/2007   Glaucoma    History of osteopenia    DEXA WNL 2008, 2015   History of uterine cancer 1999   s/p hysterectomy   HLD (hyperlipidemia)    Narcolepsy    Thyroid disease    Uterine cancer [facility])     Past Surgical History:  Procedure Laterality Date   bilateral catarct removal  2010   COLONOSCOPY  05/2003   WNL (Dr Velora Heckler)   COLONOSCOPY  11/2017   3 small TA, diverticulisis, no f/u needed Carlean Purl)   DEXA  03/2014   WNL T score +3.9   ESOPHAGOGASTRODUODENOSCOPY  11/2017   dilated esoph stenosis, 8cm HH (  Carlean Purl)   ESOPHAGOGASTRODUODENOSCOPY  12/2017   repeat dilation of esoph stenosis, 5cm HH - rec stay on PPI Carlean Purl)   ESOPHAGOGASTRODUODENOSCOPY  12/2019   GERD with esophagitis and peptic stricture s/p dilation to 4m, mod sized HH - rec increase PPI to BID x 1 yr due to ongoing active inflammation on EGD (Henrene Pastor   ORIF ANKLE FRACTURE Left 11/19/2013   Procedure: LEFT ANKLE FRACTURE OPEN TREATMENT BIMALLEOLAR ANKLE INCLUDES INTERNAL FIXATION ;  Surgeon: JJohnny Bridge MD   RETINAL DETACHMENT REPAIR W/ SCLERAL BUCKLE LE Right 03/21/2016   TOTAL ABDOMINAL HYSTERECTOMY  1999   uterine cancer    Family Psychiatric History:  depression- mom  Family History:  Family  History  Problem Relation Age of Onset   Hypothyroidism Mother    Stroke Mother        ministroke   Coronary artery disease Mother 729      s/p some heart surgery   Prostate cancer Father    Diabetes Neg Hx    Colon cancer Neg Hx    Esophageal cancer Neg Hx    Pancreatic cancer Neg Hx    Stomach cancer Neg Hx    Liver disease Neg Hx    Rectal cancer Neg Hx     Social History:  Social History   Socioeconomic History   Marital status: Married    Spouse name: Not on file   Number of children: 2   Years of education: Not on file   Highest education level: Not on file  Occupational History   Occupation: retired-occupational therapist  Tobacco Use   Smoking status: Never   Smokeless tobacco: Never  Vaping Use   Vaping Use: Never used  Substance and Sexual Activity   Alcohol use: Yes    Alcohol/week: 7.0 - 12.0 standard drinks    Types: 7 - 10 Glasses of wine per week    Comment: moderate   Drug use: No   Sexual activity: Not Currently  Other Topics Concern   Not on file  Social History Narrative   caffeine: none   Lives with husband, has 2 grown children, 1 cat   Occupation: retired, was OT for [employer]  Activity: no regular exercise, to start silver sneakers   Diet: overeating, good amt water, vegetables daily, occasional fruits   Never smoker   EtOH 1/day   Social Determinants of Health   Financial Resource Strain: Low Risk    Difficulty of Paying Living Expenses: Not hard at all  Food Insecurity: No Food Insecurity   Worried About RCharity fundraiserin the Last Year: Never true   [place] the Last Year: Never true  Transportation Needs: No Transportation Needs   Lack of Transportation (Medical): No   Lack of Transportation (Non-Medical): No  Physical Activity: Inactive   Days of Exercise per Week: 0 days   Minutes of Exercise per Session: 0 min  Stress: Stress Concern Present   Feeling of Stress : To some extent  Social Connections: Not on file     Allergies:  Allergies  Allergen Reactions   Ciprofloxacin     REACTION: sun induced rash    Metabolic Disorder Labs: Lab Results  Component Value Date   HGBA1C 6.0 09/05/2020   No results found for: PROLACTIN Lab Results  Component Value Date   CHOL 214 (H) 09/05/2020   TRIG 192.0 (H) 09/05/2020   HDL 75.60 09/05/2020   CHOLHDL 3 09/05/2020  VLDL 38.4 09/05/2020   LDLCALC 100 (H) 09/05/2020   LDLCALC 146 (H) 08/06/2016   Lab Results  Component Value Date   TSH 2.27 09/05/2020   TSH 1.73 10/29/2018    Therapeutic Level Labs: No results found for: LITHIUM No results found for: VALPROATE No components found for:  CBMZ  Current Medications: Current Outpatient Medications  Medication Sig Dispense Refill   ALPRAZolam (XANAX) 0.25 MG tablet Take 1 tablet (0.25 mg total) by mouth at bedtime as needed for anxiety. 30 tablet 2   ARIPiprazole (ABILIFY) 5 MG tablet Take 1 tablet (5 mg total) by mouth daily. 90 tablet 0   atorvastatin (LIPITOR) 20 MG tablet Take 1 tablet (20 mg total) by mouth daily. 90 tablet 3   buPROPion (WELLBUTRIN XL) 150 MG 24 hr tablet Take 1 tablet (150 mg total) by mouth in the morning. 90 tablet 0   Cholecalciferol (VITAMIN D) 50 MCG (2000 UT) CAPS Take 1 capsule (2,000 Units total) by mouth daily. 30 capsule    Cyanocobalamin (B-12) 1000 MCG SUBL Place 1 tablet under the tongue daily.     levothyroxine (SYNTHROID) 50 MCG tablet Take 1 tablet (50 mcg total) by mouth daily. 90 tablet 3   omeprazole (PRILOSEC) 40 MG capsule Take 1 capsule (40 mg total) by mouth 2 (two) times daily before a meal. 180 capsule 3   venlafaxine XR (EFFEXOR XR) 75 MG 24 hr capsule Take 1 capsule with 150 mg capsule daily 90 capsule 0   venlafaxine XR (EFFEXOR-XR) 150 MG 24 hr capsule Take 1 capsule with 75 mg capsule daily (total dose 225 mg daily) 90 capsule 0   No current facility-administered medications for this visit.     Musculoskeletal: Strength & Muscle Tone:  within normal limits Gait & Station: normal Patient leans: N/A  Psychiatric Specialty Exam: Review of Systems  Blood pressure 113/67, pulse 60, height '5\' 4"'$  (1.626 m), weight 200 lb (90.7 kg), SpO2 98 %.Body mass index is 34.33 kg/m.  General Appearance: Well Groomed  Eye Contact:  Good  Speech:  Clear and Coherent and Normal Rate  Volume:  Normal  Mood:  Euthymic  Affect:  Appropriate and Congruent  Thought Process:  Coherent, Goal Directed, and Linear  Orientation:  Full (Time, Place, and Person)  Thought Content: WDL and Logical   Suicidal Thoughts:  No  Homicidal Thoughts:  No  Memory:  Immediate;   Good Recent;   Good Remote;   Good  Judgement:  Good  Insight:  Good  Psychomotor Activity:  Normal  Concentration:  Concentration: Good and Attention Span: Good  Recall:  Good  Fund of Knowledge: Good  Language: Good  Akathisia:  No  Handed:  Right  AIMS (if indicated): not done  Assets:  Communication Skills Desire for Improvement Financial Resources/Insurance Housing Intimacy Social Support  ADL's:  Intact  Cognition: WNL  Sleep:  Good   Screenings: GAD-7    Flowsheet Row Clinical Support from 11/14/2020 in [facility] Counselor from 11/09/2020 in [place] from 05/10/2020 in [place] Visit from 06/01/2015 in [place] at [facility]  Total GAD-7 Score '11 9 16 13      '$ [place] from 10/15/2017 in [place] [facility] [place] from 04/02/2016 in [place] at [facility]  Total Score (max 30 points ) 20 20      PHQ2-9    Flowsheet  Row Clinical Support from 11/14/2020 in [facility] Counselor from 11/09/2020 in [place] Counselor from 09/26/2020 in [place] from 09/06/2020 in  [place] at [place] from 07/24/2020 in [place]  PHQ-2 Total Score '2 6 2 2 '$ 0  PHQ-9 Total Score '7 13 3 2 '$ --      Flowsheet Row Clinical Support from 11/14/2020 in [facility] Counselor from 11/09/2020 in [place] Counselor from 09/26/2020 in [place] No Risk No Risk No Risk        Assessment and Plan: Patient notes that she is doing well on her current medications.  She did inform provider that she has been falling more frequently.  Provider informed patient that Xanax can be the culprit of her falling spells.  At this time Xanax not discontinued however it may be if her following worsens.  No medication changes made today.  Patient agreeable to continue medications as prescribed.  1. Anxiety  Continue- ALPRAZolam (XANAX) 0.25 MG tablet; Take 1 tablet (0.25 mg total) by mouth at bedtime as needed for anxiety.  Dispense: 30 tablet; Refill: 2  2. MDD (major depressive disorder), recurrent, in full remission ([place])  Continue- ARIPiprazole (ABILIFY) 5 MG tablet; Take 1 tablet (5 mg total) by mouth daily.  Dispense: 90 tablet; Refill: 0 Continue- buPROPion (WELLBUTRIN XL) 150 MG 24 hr tablet; Take 1 tablet (150 mg total) by mouth in the morning.  Dispense: 90 tablet; Refill: 0 Continue- venlafaxine XR (EFFEXOR-XR) 150 MG 24 hr capsule; Take 1 capsule with 75 mg capsule daily (total dose 225 mg daily)  Dispense: 90 capsule; Refill: 0 Continue- venlafaxine XR (EFFEXOR XR) 75 MG 24 hr capsule; Take 1 capsule with 150 mg capsule daily  Dispense: 90 capsule; Refill: 0  Follow-up in 3 months Follow-up for therapy  Salley Slaughter, NP 11/14/2020, 3:29 PM

## 2020-11-16 ENCOUNTER — Other Ambulatory Visit: Payer: Self-pay | Admitting: Family Medicine

## 2020-11-16 ENCOUNTER — Encounter: Payer: Self-pay | Admitting: Family Medicine

## 2020-11-16 DIAGNOSIS — N289 Disorder of kidney and ureter, unspecified: Secondary | ICD-10-CM

## 2020-11-16 DIAGNOSIS — D801 Nonfamilial hypogammaglobulinemia: Secondary | ICD-10-CM | POA: Insufficient documentation

## 2020-11-17 ENCOUNTER — Other Ambulatory Visit: Payer: Self-pay

## 2020-11-17 ENCOUNTER — Other Ambulatory Visit: Payer: Medicare PPO

## 2020-11-17 DIAGNOSIS — N289 Disorder of kidney and ureter, unspecified: Secondary | ICD-10-CM

## 2020-11-17 DIAGNOSIS — D801 Nonfamilial hypogammaglobulinemia: Secondary | ICD-10-CM | POA: Diagnosis not present

## 2020-11-17 LAB — MICROALBUMIN / CREATININE URINE RATIO
Creatinine,U: 156.6 mg/dL
Microalb Creat Ratio: 0.8 mg/g (ref 0.0–30.0)
Microalb, Ur: 1.2 mg/dL (ref 0.0–1.9)

## 2020-11-21 ENCOUNTER — Encounter: Payer: Self-pay | Admitting: Physical Therapy

## 2020-11-21 ENCOUNTER — Ambulatory Visit: Payer: Medicare PPO | Attending: Family Medicine | Admitting: Physical Therapy

## 2020-11-21 ENCOUNTER — Other Ambulatory Visit: Payer: Self-pay

## 2020-11-21 VITALS — BP 95/73 | HR 108

## 2020-11-21 DIAGNOSIS — R2681 Unsteadiness on feet: Secondary | ICD-10-CM | POA: Insufficient documentation

## 2020-11-21 DIAGNOSIS — R262 Difficulty in walking, not elsewhere classified: Secondary | ICD-10-CM | POA: Diagnosis not present

## 2020-11-21 NOTE — Therapy (Signed)
[place] MAIN [facility] SERVICES [address] [place], [place], [address] Phone: [phone]   Fax:  [phone]  Physical Therapy Evaluation  Patient Details  Name: Faith Santiago MRN: [id number] Date of Birth: Dec 24, 1940 Referring Provider (PT): [name]   Encounter Date: 11/21/2020   PT End of Session - 11/21/20 1430     Visit Number 1    Number of Visits 24    Date for PT Re-Evaluation 12/26/20    Authorization Type Medicare [employer]    Authorization Time Period 11/21/20-02/13/21    PT Start Time 1344    PT Stop Time 1435    PT Time Calculation (min) 51 min    Equipment Utilized During Treatment Gait belt    Activity Tolerance Patient limited by fatigue    Behavior During Therapy [facility] for tasks assessed/performed             Past Medical History:  Diagnosis Date   Anemia, unspecified    Anxiety    Arthritis    hands R>L, neck and lower back   Bimalleolar fracture of left ankle 11/19/2013   [place]   Cataract    COVID-19 05/2019   Depression    Esophageal reflux    GERD with stricture 06/03/2007   Glaucoma    History of osteopenia    DEXA WNL 2008, 2015   History of uterine cancer 1999   s/p hysterectomy   HLD (hyperlipidemia)    Narcolepsy    Thyroid disease    Uterine cancer ([place])     Past Surgical History:  Procedure Laterality Date   bilateral catarct removal  2010   COLONOSCOPY  05/2003   WNL (Dr Velora Heckler)   COLONOSCOPY  11/2017   3 small TA, diverticulisis, no f/u needed Carlean Purl)   DEXA  03/2014   WNL T score +3.9   ESOPHAGOGASTRODUODENOSCOPY  11/2017   dilated esoph stenosis, 8cm HH ([place])   ESOPHAGOGASTRODUODENOSCOPY  12/2017   repeat dilation of esoph stenosis, 5cm HH - rec stay on PPI Carlean Purl)   ESOPHAGOGASTRODUODENOSCOPY  12/2019   GERD with esophagitis and peptic stricture s/p dilation to 17m, mod sized HH - rec increase PPI to BID x 1 yr due to ongoing active inflammation on EGD (Henrene Pastor    ORIF ANKLE FRACTURE Left 11/19/2013   Procedure: LEFT ANKLE FRACTURE OPEN TREATMENT BIMALLEOLAR ANKLE INCLUDES INTERNAL FIXATION ;  Surgeon: JJohnny Bridge MD   RETINAL DETACHMENT REPAIR W/ SCLERAL BUCKLE LE Right 03/21/2016   TOTAL ABDOMINAL HYSTERECTOMY  1999   uterine cancer    Vitals:   11/21/20 1357  BP: 95/73  Pulse: (!) 108      Subjective Assessment - 11/21/20 1345     Subjective pt reports she has had 8 falls in the last 6 months. On 2 occassions her fall has caused head injury. Pt reports she is unsure of why she is falling and when it happens it is sudden. Pt reports some delayed awareness of falling. Pt reports no hx of seizures. Pt reports a spell where she lost consciousness and felt like she had no control of her arms of LE.    Pertinent History Pt has referral scheduled with neuroloist about 8 weeks from initial evaluation on 8/9. Pt MD has been contacted via secure epic chat regarding her risk for polypharmacy. Pt has had multiple falls in the last 6 months, some resulting in minor head injuries and she reports many of them have occurreed when  she is turning around.    Limitations Walking;Standing;House hold activities    How long can you walk comfortably? walking about 300-400 feet            Hallway Ambulation assessment with and without AD:  -AMB overground s device 353f c linear decline in control of lumbopelvis and trunk in frontal plane after halfway point -AMB overground c [id number] less dramatic decline in gait -pt has progression of left foot drop in gait and dragging of limb    [facility] PT Assessment - 11/21/20 0001       Assessment   Medical Diagnosis Frequent Falls    Referring Provider (PT) GuRia Bush  Onset Date/Surgical Date --   >6 months ago [facility]   Hand Dominance Right    Prior Therapy Seen here for balance in 2019      Precautions   Precautions Fall      Restrictions   Weight  Bearing Restrictions No      Balance Screen   Has the patient fallen in the past 6 months Yes    How many times? 8    Has the patient had a decrease in activity level because of a fear of falling?  Yes    Is the patient reluctant to leave their home because of a fear of falling?  Yes      Home Environment   Additional Comments Has 5 STE and bedroom in on main floor      Prior Function   Level of Independence Independent with basic ADLs    Vocation Retired      CoAssociate Professor Overall Cognitive Status Within Functional Limits for tasks assessed      Observation/Other Assessments   Focus on Therapeutic Outcomes (FOTO)  53      Posture/Postural Control   Posture/Postural Control Postural limitations    Postural Limitations Rounded Shoulders      Ambulation/Gait   Gait Comments Able to ambulate increased distance with use of rollator versus without rollator. With increased ambulation distance pt began to show foot drop on the left as well as scissoring gait/ narow base of support.      Balance   Balance Assessed Yes      High Level Balance   High Level Balance Comments Sidestepping: Pt took very small steps and also walked anteriorly while sidestepping. When walking backwards pt took very small steps and was hesitant to complete movement.absent trunk rotation, 75% reduced arm swing bilat, tendency for early heel rise and flatfoot strike, no LOB with turns, after halfway poitn has more narrow BOS, more occurence of crossover, more compensatory high-velocity bilat compensated trendelenburg in the trunk (lateral flexion)      Functional Gait  Assessment   Gait assessed  No                        Objective measurements completed on examination: See above findings.               PT Education - 11/21/20 1357     Education Details Pt educated regarding adv directives and importance of turning into hospital    Person(s) Educated Patient    Methods Explanation     Comprehension Verbalized understanding              PT Short Term Goals - 11/21/20 1450       PT  SHORT TERM GOAL #1   Title Patient will be independent in home exercise program to improve strength/mobility for better functional independence with ADLs.    Baseline Pt does not have HEP    Time 4    Period Weeks    Status New    Target Date 12/12/20      PT SHORT TERM GOAL #2   Title Pt will improve FOT socre to at least 58 in order to demonstrate statistically signicicant improvement in mobility and quality of life    Baseline 53    Time 4    Period Weeks    Status New    Target Date 12/19/20      PT SHORT TERM GOAL #3   Title Patient will deny any falls over past 4 weeks to demonstrate improved safety awareness at home and work.    Baseline Pt reports 8 falls in the past 6 months    Time 4    Period Weeks    Status New    Target Date 12/19/20               PT Long Term Goals - 11/21/20 1454       PT LONG TERM GOAL #1   Title Pt will improve self selected gait speed with LRAD to 1 m/s in order to indicate improved safety with community ambulation    Baseline .59    Time 12    Period Weeks    Status New    Target Date 01/16/21      PT LONG TERM GOAL #2   Title Patient will increase six minute walk test distance to >1000 with LRAD for progression to community ambulator and improve gait ability    Baseline Pt required seated rest break following 300 foot of walking without AD and after 400 foot of walking with rollator    Time 12    Period Weeks    Status New    Target Date 01/16/21      PT LONG TERM GOAL #3   Title Pt will improve FOTO score by 12 points or greater in order to to indicate statistically significant improvement in mobility and quality of life    Baseline 53    Time 12    Period Weeks    Status New    Target Date 01/16/21                    Plan - 11/21/20 1653     Clinical Impression Statement Pt is a 80 y.o. female who  presents to physical therapy for balance and gait training following multiple falls in the last 6 months. Pt presents with deficits in gait speed and endurance limited by both fatigue and pain. Pt has multimple factors that may be impairing her balance including polypharmacy, gait impairments, chronic low back pain and other factors still to be evaluated. Pt MD has been contacted regarding polypharmacy in order to potentially resolve one of these factors. Pt would greatly benefit from physicla therpay intervention in order to improve her balance, gait, and to reduce her risk of falls and increase her QOL as a result.    Personal Factors and Comorbidities Age;Comorbidity 1    Examination-Activity Limitations Bend;Squat;Stand;Stairs    Examination-Participation Restrictions Community Activity    Stability/Clinical Decision Making Evolving/Moderate complexity    Clinical Decision Making Moderate    Rehab Potential Good    PT Frequency 2x / week    PT Duration  12 weeks    PT Treatment/Interventions ADLs/Self Care Home Management;DME Instruction;Gait training;Stair training;Functional mobility training;Therapeutic activities;Therapeutic exercise;Balance training;Neuromuscular re-education;Passive range of motion;Energy conservation    PT Next Visit Plan Formal balance assessment including movement through all movement planes    PT Home Exercise Plan Will begin to develop HEP next session    Consulted and Agree with Plan of Care Patient             Patient will benefit from skilled therapeutic intervention in order to improve the following deficits and impairments:  Abnormal gait, Decreased balance, Decreased endurance, Decreased mobility, Decreased activity tolerance  Visit Diagnosis: Unsteadiness on feet  Difficulty in walking, not elsewhere classified     Problem List Patient Active Problem List   Diagnosis Date Noted   Hypogammaglobulinemia ([place]) 11/16/2020   Imbalance 11/13/2020    Renal insufficiency 09/13/2020   Right shoulder pain 09/12/2020   Vitamin B12 deficiency 09/06/2020   MDD (major depressive disorder), recurrent, in partial remission ([place]) 02/23/2019   Urinary incontinence 03/15/2018   Falls 03/15/2018   History of uterine cancer 01/22/2018   Prediabetes 10/14/2017   Hypothyroidism 04/07/2015   Vitamin D deficiency 04/02/2015   Macrocytosis 03/28/2015   Other fatigue 11/25/2014   Advanced care planning/counseling discussion 03/15/2014   Health maintenance examination 03/15/2014   Postsurgical menopause 08/07/2011   Medicare annual wellness visit, subsequent 08/06/2011   Anemia 06/07/2009   Osteoarthritis 06/07/2009   Narcolepsy without cataplexy 05/31/2008   HLD (hyperlipidemia) 06/03/2007   MDD (major depressive disorder), recurrent episode, severe ([place]) 06/03/2007   GERD with stricture 06/03/2007    Particia Lather PT, DPT  11/21/2020, 5:29 PM  [place] [facility] MAIN [facility] SERVICES [address] [place], [place], [address] Phone: [phone]   Fax:  [phone]  Name: Faith Santiago MRN: [id number] Date of Birth: 1940/05/04

## 2020-11-22 LAB — PROTEIN ELECTROPHORESIS, SERUM, WITH REFLEX
Albumin ELP: 3.9 g/dL (ref 3.8–4.8)
Alpha 1: 0.3 g/dL (ref 0.2–0.3)
Alpha 2: 0.7 g/dL (ref 0.5–0.9)
Beta 2: 0.3 g/dL (ref 0.2–0.5)
Beta Globulin: 0.4 g/dL (ref 0.4–0.6)
Gamma Globulin: 0.6 g/dL — ABNORMAL LOW (ref 0.8–1.7)
Total Protein: 6.3 g/dL (ref 6.1–8.1)

## 2020-11-22 LAB — KAPPA/LAMBDA LIGHT CHAINS
Kappa free light chain: 28 mg/L — ABNORMAL HIGH (ref 3.3–19.4)
Kappa:Lambda Ratio: 1.54 (ref 0.26–1.65)
Lambda Free Lght Chn: 18.2 mg/L (ref 5.7–26.3)

## 2020-11-22 LAB — IFE INTERPRETATION: Immunofix Electr Int: NOT DETECTED

## 2020-11-22 LAB — TEST AUTHORIZATION

## 2020-11-27 ENCOUNTER — Telehealth: Payer: Self-pay

## 2020-11-27 NOTE — Telephone Encounter (Signed)
Attempted to contact pt.  No answer.  No vm.  Need to relay results and Dr. Synthia Innocent message.  Labs/Dr. Synthia Innocent msg: Plz notify urine protein levels returned normal, blood protein levels returned overall ok.  We will continue to watch kidney function closely.

## 2020-11-28 ENCOUNTER — Other Ambulatory Visit: Payer: Self-pay

## 2020-11-28 ENCOUNTER — Ambulatory Visit: Payer: Medicare PPO | Admitting: Physical Therapy

## 2020-11-28 DIAGNOSIS — R262 Difficulty in walking, not elsewhere classified: Secondary | ICD-10-CM | POA: Diagnosis not present

## 2020-11-28 DIAGNOSIS — R2681 Unsteadiness on feet: Secondary | ICD-10-CM | POA: Diagnosis not present

## 2020-11-28 NOTE — Telephone Encounter (Signed)
Attempted to contact pt.  No answer.  No vm.  Need to relay results and Dr. Synthia Innocent message.  Labs/Dr. Synthia Innocent msg: Plz notify urine protein levels returned normal, blood protein levels returned overall ok.  We will continue to watch kidney function closely.

## 2020-11-28 NOTE — Therapy (Signed)
[place] MAIN [facility] SERVICES [address] [place], [place], [address] Phone: [phone]   Fax:  [phone]  Physical Therapy Treatment  Patient Details  Name: Faith Santiago MRN: [id number] Date of Birth: 1940/04/16 Referring Provider (PT): [name]   Encounter Date: 11/28/2020   PT End of Session - 11/28/20 1706     Visit Number 2    Number of Visits 24    Date for PT Re-Evaluation 12/26/20    Authorization Type Medicare [employer]    Authorization Time Period 11/21/20-02/13/21    Progress Note Due on Visit 10    PT Start Time 1630    PT Stop Time 1700    PT Time Calculation (min) 30 min    Equipment Utilized During Treatment Gait belt    Activity Tolerance Patient tolerated treatment well;Patient limited by fatigue    Behavior During Therapy [facility] for tasks assessed/performed             Past Medical History:  Diagnosis Date   Anemia, unspecified    Anxiety    Arthritis    hands R>L, neck and lower back   Bimalleolar fracture of left ankle 11/19/2013   [place]   Cataract    COVID-19 05/2019   Depression    Esophageal reflux    GERD with stricture 06/03/2007   Glaucoma    History of osteopenia    DEXA WNL 2008, 2015   History of uterine cancer 1999   s/p hysterectomy   HLD (hyperlipidemia)    Narcolepsy    Thyroid disease    Uterine cancer ([place])     Past Surgical History:  Procedure Laterality Date   bilateral catarct removal  2010   COLONOSCOPY  05/2003   WNL (Dr Velora Heckler)   COLONOSCOPY  11/2017   3 small TA, diverticulisis, no f/u needed Carlean Purl)   DEXA  03/2014   WNL T score +3.9   ESOPHAGOGASTRODUODENOSCOPY  11/2017   dilated esoph stenosis, 8cm HH (Gessner)   ESOPHAGOGASTRODUODENOSCOPY  12/2017   repeat dilation of esoph stenosis, 5cm HH - rec stay on PPI Carlean Purl)   ESOPHAGOGASTRODUODENOSCOPY  12/2019   GERD with esophagitis and peptic stricture s/p dilation to 71m, mod sized HH - rec increase PPI  to BID x 1 yr due to ongoing active inflammation on EGD (Henrene Pastor   ORIF ANKLE FRACTURE Left 11/19/2013   Procedure: LEFT ANKLE FRACTURE OPEN TREATMENT BIMALLEOLAR ANKLE INCLUDES INTERNAL FIXATION ;  Surgeon: JJohnny Bridge MD   RETINAL DETACHMENT REPAIR W/ SCLERAL BUCKLE LE Right 03/21/2016   TOTAL ABDOMINAL HYSTERECTOMY  1999   uterine cancer    There were no vitals filed for this visit.   Subjective Assessment - 11/28/20 1705     Subjective Pt does not report any falls since previous physical therpay session. Pt does not report any other changes since last session    Pertinent History Pt has referral scheduled with neuroloist about 8 weeks from initial evaluation on 8/9. Pt MD has been contacted via secure epic chat regarding her risk for polypharmacy. Pt has had multiple falls in the last 6 months, some resulting in minor head injuries and she reports many of them have occurreed when she is turning around.    Limitations Walking;Standing;House hold activities    How long can you sit comfortably? NA    How long can you stand comfortably? increased pain with prolonged standing >30 min    How long can you walk  comfortably? walking about 300-400 feet    Diagnostic tests none recent    Patient Stated Goals walk farther, reduce fall risk;                 [facility] PT Assessment - 11/28/20 0001       Standardized Balance Assessment   Standardized Balance Assessment Berg Balance Test      Berg Balance Test   Sit to Stand Able to stand without using hands and stabilize independently    Standing Unsupported Able to stand safely 2 minutes    Sitting with Back Unsupported but Feet Supported on Floor or Stool Able to sit safely and securely 2 minutes    Stand to Sit Sits safely with minimal use of hands    Transfers Able to transfer safely, minor use of hands    Standing Unsupported with Eyes Closed Able to stand 10 seconds with supervision    Standing Unsupported with Feet Together Able to  place feet together independently and stand 1 minute safely    From Standing, Reach Forward with Outstretched Arm Can reach forward >12 cm safely (5")    From Standing Position, Pick up Object from Floor Able to pick up shoe, needs supervision    From Standing Position, Turn to Look Behind Over each Shoulder Looks behind from both sides and weight shifts well    Turn 360 Degrees Able to turn 360 degrees safely but slowly    Standing Unsupported, Alternately Place Feet on Step/Stool Able to complete 4 steps without aid or supervision    Standing Unsupported, One Foot in Front Needs help to step but can hold 15 seconds    Standing on One Leg Tries to lift leg/unable to hold 3 seconds but remains standing independently    Total Score 43                  Treatment this date  10 x STS with cues to prevent UE assistance  Neuro Re Ed Merrilee Jansky - see scores above The following activities were completed in parallel bars or at balance bar   Wall taps with ball x 10 In order to challenge pt to reach outside limits of stability Ball touches to various places as directed by therapist in order to challenge limits of BOS  Step over hurdles x 20 ea with B HH assist  Step up onto airex pad x 10 on ea LE with B HH assist  Semitandem balance: 1 x 30 sec CGA  Tandem balance 1 x 30 sec with ea LE CGA   Pt required seated rest breaks due to fatigue following step up onto airex pad exercise  Treatment session shortened due to patient arriving to appointment late.                 PT Education - 11/28/20 1706     Education Details Pt educated regarding PT tests and measures and iprotance of PT interventions to prevent fall risk    Person(s) Educated Patient    Methods Explanation    Comprehension Verbalized understanding              PT Short Term Goals - 11/21/20 1450       PT SHORT TERM GOAL #1   Title Patient will be independent in home exercise program to improve  strength/mobility for better functional independence with ADLs.    Baseline Pt does not have HEP    Time 4    Period Weeks  Status New    Target Date 12/12/20      PT SHORT TERM GOAL #2   Title Pt will improve FOT socre to at least 58 in order to demonstrate statistically signicicant improvement in mobility and quality of life    Baseline 53    Time 4    Period Weeks    Status New    Target Date 12/19/20      PT SHORT TERM GOAL #3   Title Patient will deny any falls over past 4 weeks to demonstrate improved safety awareness at home and work.    Baseline Pt reports 8 falls in the past 6 months    Time 4    Period Weeks    Status New    Target Date 12/19/20               PT Long Term Goals - 11/21/20 1454       PT LONG TERM GOAL #1   Title Pt will improve self selected gait speed with LRAD to 1 m/s in order to indicate improved safety with community ambulation    Baseline .67    Time 12    Period Weeks    Status New    Target Date 01/16/21      PT LONG TERM GOAL #2   Title Patient will increase six minute walk test distance to >1000 with LRAD for progression to community ambulator and improve gait ability    Baseline Pt required seated rest break following 300 foot of walking without AD and after 400 foot of walking with rollator    Time 12    Period Weeks    Status New    Target Date 01/16/21      PT LONG TERM GOAL #3   Title Pt will improve FOTO score by 12 points or greater in order to to indicate statistically significant improvement in mobility and quality of life    Baseline 53    Time 12    Period Weeks    Status New    Target Date 01/16/21                   Plan - 11/28/20 1707     Clinical Impression Statement Pt tolerated treatment session well as we performed standardized balance assessment as well as initiated some balance related exercises in order to challenge her static and dynamic balance reactions. Pt BERg score indicate  significant risk of falling. Pt reported step up onto airex pad was the most difficyult activity performed on this date. Pt was given opportunity to lose balance and assistance was only provided in order to prevent pt from falling ot the floor. Pt will ocntinue to benefit from skilled physical therpay intervention in order to address her function and improve fall risk.    Personal Factors and Comorbidities Age;Comorbidity 1    Examination-Activity Limitations Bend;Squat;Stand;Stairs    Stability/Clinical Decision Making Evolving/Moderate complexity    Clinical Decision Making Moderate    Rehab Potential Good    PT Frequency 2x / week    PT Duration 12 weeks    PT Treatment/Interventions ADLs/Self Care Home Management;DME Instruction;Gait training;Stair training;Functional mobility training;Therapeutic activities;Therapeutic exercise;Balance training;Neuromuscular re-education;Passive range of motion;Energy conservation    PT Next Visit Plan Develop HEP next session    PT Home Exercise Plan Will hand out formal HEP next session    Consulted and Agree with Plan of Care Patient  Patient will benefit from skilled therapeutic intervention in order to improve the following deficits and impairments:  Abnormal gait, Decreased balance, Decreased endurance, Decreased mobility, Decreased activity tolerance  Visit Diagnosis: Unsteadiness on feet  Difficulty in walking, not elsewhere classified     Problem List Patient Active Problem List   Diagnosis Date Noted   Hypogammaglobulinemia ([place]) 11/16/2020   Imbalance 11/13/2020   Renal insufficiency 09/13/2020   Right shoulder pain 09/12/2020   Vitamin B12 deficiency 09/06/2020   MDD (major depressive disorder), recurrent, in partial remission ([place]) 02/23/2019   Urinary incontinence 03/15/2018   Falls 03/15/2018   History of uterine cancer 01/22/2018   Prediabetes 10/14/2017   Hypothyroidism 04/07/2015   Vitamin D deficiency  04/02/2015   Macrocytosis 03/28/2015   Other fatigue 11/25/2014   Advanced care planning/counseling discussion 03/15/2014   Health maintenance examination 03/15/2014   Postsurgical menopause 08/07/2011   Medicare annual wellness visit, subsequent 08/06/2011   Anemia 06/07/2009   Osteoarthritis 06/07/2009   Narcolepsy without cataplexy 05/31/2008   HLD (hyperlipidemia) 06/03/2007   MDD (major depressive disorder), recurrent episode, severe ([place]) 06/03/2007   GERD with stricture 06/03/2007   Rivka Barbara PT, DPT   Particia Lather 11/28/2020, 5:14 PM  [place] MAIN [facility] SERVICES [address] [place], [place], [address] Phone: [phone]   Fax:  [phone]  Name: Faith Santiago MRN: [id number] Date of Birth: 04-Apr-1941

## 2020-11-28 NOTE — Telephone Encounter (Signed)
Pt called returning your call 

## 2020-11-29 NOTE — Telephone Encounter (Signed)
Spoke with pt relaying results and Dr. G's message.  Pt verbalizes understanding.  

## 2020-11-30 ENCOUNTER — Ambulatory Visit: Payer: Medicare PPO | Admitting: Physical Therapy

## 2020-11-30 ENCOUNTER — Encounter: Payer: Self-pay | Admitting: Physical Therapy

## 2020-11-30 ENCOUNTER — Other Ambulatory Visit: Payer: Self-pay

## 2020-11-30 DIAGNOSIS — R262 Difficulty in walking, not elsewhere classified: Secondary | ICD-10-CM | POA: Diagnosis not present

## 2020-11-30 DIAGNOSIS — R2681 Unsteadiness on feet: Secondary | ICD-10-CM | POA: Diagnosis not present

## 2020-11-30 NOTE — Therapy (Signed)
[place] MAIN [facility] SERVICES [address] [place], [place], [address] Phone: [phone]   Fax:  [phone]  Physical Therapy Treatment  Patient Details  Name: Faith Santiago MRN: [id number] Date of Birth: [birth year] Referring Provider (PT): [name]   Encounter Date: 11/30/2020   PT End of Session - 11/30/20 1714     Visit Number 3    Number of Visits 24    Date for PT Re-Evaluation 12/26/20    Authorization Type Medicare [employer]    Authorization Time Period 11/21/20-02/13/21    Progress Note Due on Visit 10    PT Start Time 1601    PT Stop Time 1645    PT Time Calculation (min) 44 min    Equipment Utilized During Treatment Gait belt    Activity Tolerance Patient tolerated treatment well;Patient limited by fatigue             Past Medical History:  Diagnosis Date   Anemia, unspecified    Anxiety    Arthritis    hands R>L, neck and lower back   Bimalleolar fracture of left ankle 11/19/2013   [place]   Cataract    COVID-19 05/2019   Depression    Esophageal reflux    GERD with stricture 06/03/2007   Glaucoma    History of osteopenia    DEXA WNL 2008, 2015   History of uterine cancer 1999   s/p hysterectomy   HLD (hyperlipidemia)    Narcolepsy    Thyroid disease    Uterine cancer ([place])     Past Surgical History:  Procedure Laterality Date   bilateral catarct removal  2010   COLONOSCOPY  05/2003   WNL (Dr Velora Heckler)   COLONOSCOPY  11/2017   3 small TA, diverticulisis, no f/u needed Carlean Purl)   DEXA  03/2014   WNL T score +3.9   ESOPHAGOGASTRODUODENOSCOPY  11/2017   dilated esoph stenosis, 8cm HH (Gessner)   ESOPHAGOGASTRODUODENOSCOPY  12/2017   repeat dilation of esoph stenosis, 5cm HH - rec stay on PPI Carlean Purl)   ESOPHAGOGASTRODUODENOSCOPY  12/2019   GERD with esophagitis and peptic stricture s/p dilation to 39m, mod sized HH - rec increase PPI to BID x 1 yr due to ongoing active inflammation on EGD  (Henrene Pastor   ORIF ANKLE FRACTURE Left 11/19/2013   Procedure: LEFT ANKLE FRACTURE OPEN TREATMENT BIMALLEOLAR ANKLE INCLUDES INTERNAL FIXATION ;  Surgeon: JJohnny Bridge MD   RETINAL DETACHMENT REPAIR W/ SCLERAL BUCKLE LE Right 03/21/2016   TOTAL ABDOMINAL HYSTERECTOMY  1999   uterine cancer    There were no vitals filed for this visit.   Subjective Assessment - 11/30/20 1712     Subjective Pt does not report any falls since previous physical therpay session. Pt does not report any other changes since last session.    Pertinent History Pt has referral scheduled with neuroloist about 8 weeks from initial evaluation on 8/9. Pt MD has been contacted via secure epic chat regarding her risk for polypharmacy. Pt has had multiple falls in the last 6 months, some resulting in minor head injuries and she reports many of them have occurreed when she is turning around.    Limitations Walking;Standing;House hold activities    How long can you sit comfortably? NA    How long can you stand comfortably? increased pain with prolonged standing >30 min    How long can you walk comfortably? walking about 300-400 feet    Diagnostic tests  none recent    Patient Stated Goals walk farther, reduce fall risk;     Currently in Pain? No/denies              Treatment this date   2 x 10 x STS  without UE support, pt rated first set as mod and second set as hard (at end of set)  Neuro Re Ed  The following activities were completed in parallel bars or at balance bar    Wall taps with ball x 6 reaching superiorly and laterally to each side in order to challenge pt to reach outside limits of stability  Step over hurdles x 20 ea with B HH assist   Step up onto airex pad x 10 on ea LE with no HH assist, CGA provided, 1 LOB to the right when stepping up with the left but pt was able to recover balance with stepping strategy without assistance form PT  Tandem balance 2 x 30 sec with ea LE CGA, on second set hpt  had 2 LOB but was able to recover with stepping strategy without assistance from [employer], B HH assist, cues for slow and controlled movement. X 20  If not otherwise listed, CGA was provided for all of the above exercises   Pt required seated rest breaks due to fatigue following every 3-5 exercises performed in standing  HEP:   Access Code: [email or web address] URL: [email or web address] Date: 11/30/2020 Prepared by: Rivka Barbara  Exercises  Sit to Stand Without Arm Support - 1 x daily - 7 x weekly - 2 sets - 10 reps Standing March with Counter Support - 1 x daily - 7 x weekly - 2 sets - 10 reps Side Stepping with Counter Support - 1 x daily - 7 x weekly - 2 sets - 10 reps Standing Tandem Balance with Counter Support - 1 x daily - 7 x weekly - 3 reps - 30 hold                          PT Education - 11/30/20 1713     Education Details Pt provided HEP as listed later in note    Person(s) Educated Patient    Methods Explanation;Handout    Comprehension Verbalized understanding;Returned demonstration              PT Short Term Goals - 11/21/20 1450       PT SHORT TERM GOAL #1   Title Patient will be independent in home exercise program to improve strength/mobility for better functional independence with ADLs.    Baseline Pt does not have HEP    Time 4    Period Weeks    Status New    Target Date 12/12/20      PT SHORT TERM GOAL #2   Title Pt will improve FOT socre to at least 58 in order to demonstrate statistically signicicant improvement in mobility and quality of life    Baseline 53    Time 4    Period Weeks    Status New    Target Date 12/19/20      PT SHORT TERM GOAL #3   Title Patient will deny any falls over past 4 weeks to demonstrate improved safety awareness at home and work.    Baseline Pt reports 8 falls in the past 6 months    Time 4    Period Weeks    Status New    Target Date  12/19/20                PT Long Term Goals - 11/21/20 1454       PT LONG TERM GOAL #1   Title Pt will improve self selected gait speed with LRAD to 1 m/s in order to indicate improved safety with community ambulation    Baseline .59    Time 12    Period Weeks    Status New    Target Date 01/16/21      PT LONG TERM GOAL #2   Title Patient will increase six minute walk test distance to >1000 with LRAD for progression to community ambulator and improve gait ability    Baseline Pt required seated rest break following 300 foot of walking without AD and after 400 foot of walking with rollator    Time 12    Period Weeks    Status New    Target Date 01/16/21      PT LONG TERM GOAL #3   Title Pt will improve FOTO score by 12 points or greater in order to to indicate statistically significant improvement in mobility and quality of life    Baseline 53    Time 12    Period Weeks    Status New    Target Date 01/16/21                   Plan - 11/30/20 1714     Clinical Impression Statement Continued with current plan of care as laid out in evaluation and recent prior sessions. Pt remains motivated to advance progress toward goals in order to maximize independence and safety at home. Pt requires high level assistance and cuing for completion of exercises in order to provide adequate level of stimulation and perturbation. Author allows pt as much opportunity as possible to perform independent righting strategies, only stepping in when pt is unable to prevent falling to floor. Pt closely monitored throughout session for safe vitals response and to maximize patient safety during interventions. Pt continues to demonstrate progress toward goals AEB progression of some interventions this date either in volume or intensity.    Personal Factors and Comorbidities Age;Comorbidity 1    Examination-Activity Limitations Bend;Squat;Stand;Stairs    Examination-Participation Restrictions Community Activity     Stability/Clinical Decision Making Evolving/Moderate complexity    Clinical Decision Making Moderate    Rehab Potential Good    Clinical Impairments Affecting Rehab Potential motivated, good results from previous therapy    PT Frequency 2x / week    PT Duration 12 weeks    PT Treatment/Interventions ADLs/Self Care Home Management;DME Instruction;Gait training;Stair training;Functional mobility training;Therapeutic activities;Therapeutic exercise;Balance training;Neuromuscular re-education;Passive range of motion;Energy conservation    PT Next Visit Plan Develop HEP next session    PT Home Exercise Plan HEP handout provided 11/30/20. Access Code: [email or web address]  URL: [email or web address]    Consulted and Agree with Plan of Care Patient             Patient will benefit from skilled therapeutic intervention in order to improve the following deficits and impairments:  Abnormal gait, Decreased balance, Decreased endurance, Decreased mobility, Decreased activity tolerance  Visit Diagnosis: Unsteadiness on feet  Difficulty in walking, not elsewhere classified     Problem List Patient Active Problem List   Diagnosis Date Noted   Hypogammaglobulinemia ([place]) 11/16/2020   Imbalance 11/13/2020   Renal insufficiency 09/13/2020   Right shoulder pain 09/12/2020   Vitamin B12 deficiency 09/06/2020   MDD (major  depressive disorder), recurrent, in partial remission ([place]) 02/23/2019   Urinary incontinence 03/15/2018   Falls 03/15/2018   History of uterine cancer 01/22/2018   Prediabetes 10/14/2017   Hypothyroidism 04/07/2015   Vitamin D deficiency 04/02/2015   Macrocytosis 03/28/2015   Other fatigue 11/25/2014   Advanced care planning/counseling discussion 03/15/2014   Health maintenance examination 03/15/2014   Postsurgical menopause 08/07/2011   Medicare annual wellness visit, subsequent 08/06/2011   Anemia 06/07/2009   Osteoarthritis 06/07/2009   Narcolepsy without cataplexy  05/31/2008   HLD (hyperlipidemia) 06/03/2007   MDD (major depressive disorder), recurrent episode, severe ([place]) 06/03/2007   GERD with stricture 06/03/2007   Rivka Barbara PT, DPT   Particia Lather 11/30/2020, 5:21 PM  [place] [address] [place], [place], [address] Phone: [phone]   Fax:  [phone]  Name: Faith Santiago MRN: [id number] Date of Birth: September 25, 1940

## 2020-12-05 ENCOUNTER — Ambulatory Visit: Payer: Medicare PPO | Admitting: Physical Therapy

## 2020-12-07 ENCOUNTER — Other Ambulatory Visit: Payer: Self-pay

## 2020-12-07 ENCOUNTER — Ambulatory Visit: Payer: Medicare PPO | Admitting: Physical Therapy

## 2020-12-07 DIAGNOSIS — R2681 Unsteadiness on feet: Secondary | ICD-10-CM

## 2020-12-07 DIAGNOSIS — R262 Difficulty in walking, not elsewhere classified: Secondary | ICD-10-CM | POA: Diagnosis not present

## 2020-12-07 NOTE — Therapy (Signed)
[place] MAIN [facility] SERVICES [address] [place], [place], [address] Phone: [phone]   Fax:  [phone]  Physical Therapy Treatment  Patient Details  Name: Faith Santiago MRN: [id number] Date of Birth: 06-17-1940 Referring Provider (PT): [name]   Encounter Date: 12/07/2020   PT End of Session - 12/07/20 1402     Visit Number 4    Number of Visits 24    Date for PT Re-Evaluation 12/26/20    Authorization Type Medicare [employer]    Authorization Time Period 11/21/20-02/13/21    Progress Note Due on Visit 10    PT Start Time 1350    PT Stop Time 1429    PT Time Calculation (min) 39 min    Equipment Utilized During Treatment Gait belt    Activity Tolerance Patient tolerated treatment well;Patient limited by fatigue    Behavior During Therapy [facility] for tasks assessed/performed             Past Medical History:  Diagnosis Date   Anemia, unspecified    Anxiety    Arthritis    hands R>L, neck and lower back   Bimalleolar fracture of left ankle 11/19/2013   [place]   Cataract    COVID-19 05/2019   Depression    Esophageal reflux    GERD with stricture 06/03/2007   Glaucoma    History of osteopenia    DEXA WNL 2008, 2015   History of uterine cancer 1999   s/p hysterectomy   HLD (hyperlipidemia)    Narcolepsy    Thyroid disease    Uterine cancer ([place])     Past Surgical History:  Procedure Laterality Date   bilateral catarct removal  2010   COLONOSCOPY  05/2003   WNL (Dr Velora Heckler)   COLONOSCOPY  11/2017   3 small TA, diverticulisis, no f/u needed Carlean Purl)   DEXA  03/2014   WNL T score +3.9   ESOPHAGOGASTRODUODENOSCOPY  11/2017   dilated esoph stenosis, 8cm HH (Gessner)   ESOPHAGOGASTRODUODENOSCOPY  12/2017   repeat dilation of esoph stenosis, 5cm HH - rec stay on PPI Carlean Purl)   ESOPHAGOGASTRODUODENOSCOPY  12/2019   GERD with esophagitis and peptic stricture s/p dilation to 69m, mod sized HH - rec increase PPI  to BID x 1 yr due to ongoing active inflammation on EGD (Henrene Pastor   ORIF ANKLE FRACTURE Left 11/19/2013   Procedure: LEFT ANKLE FRACTURE OPEN TREATMENT BIMALLEOLAR ANKLE INCLUDES INTERNAL FIXATION ;  Surgeon: JJohnny Bridge MD   RETINAL DETACHMENT REPAIR W/ SCLERAL BUCKLE LE Right 03/21/2016   TOTAL ABDOMINAL HYSTERECTOMY  1999   uterine cancer    There were no vitals filed for this visit.   Subjective Assessment - 12/07/20 1355     Subjective Pt reports falling at church on monday night. She reports she was following another individual into a doorway and her foot caught in the doorway and she fell forward. Pt was assisted to standing and did not sustain any injuries to her knowledge.    Pertinent History Pt has referral scheduled with neuroloist about 8 weeks from initial evaluation on 8/9. Pt MD has been contacted via secure epic chat regarding her risk for polypharmacy. Pt has had multiple falls in the last 6 months, some resulting in minor head injuries and she reports many of them have occurreed when she is turning around.    Limitations Walking;Standing;House hold activities    How long can you sit comfortably? NA  How long can you stand comfortably? increased pain with prolonged standing >30 min    How long can you walk comfortably? walking about 300-400 feet    Diagnostic tests none recent    Patient Stated Goals walk farther, reduce fall risk;     Currently in Pain? No/denies    Pain Score 0-No pain              Treatment this session  2 x 10 x STS  without UE support, pt rated first set as mod and second set as hard (at end of set)   Neuro Re Ed   The following activities were completed in parallel bars or at balance bar    Step over hurdles x 20 ea with B HH assist on fisrt 1st set and HH as needed on 2nd set, encourged to minimize use of UE, pt still required intermittent use of UE  Step up onto airex pad x 10 on ea LE with no HH assist, CGA provided, 1 LOB to the  right when stepping up with the right but pt was able to recover balance with stepping strategy without assistance form PT Toe taps onto elevated surface from airex pad x 10 ea LE with CGA and 1 HH assist  Tandem balance 1 x 30 sec with ea LE CGA, no LOB evident today with this activity  Balance obstacle couse consisting of hurdle, airex pad for step onto and 3 cones weaving in and out. 3 x through with CGA, 1 LOB and self corrected by pt.   Pt required occasional rest breaks due fatigue, PT was quick to ask when pt appeared to be fatiguing in order to prevent excessive fatigue.                          PT Education - 12/07/20 1358     Education Details Pt educated regarding importance oftherapy in further prevention of falling    Person(s) Educated Patient    Methods Explanation;Handout    Comprehension Verbalized understanding;Returned demonstration              PT Short Term Goals - 11/21/20 1450       PT SHORT TERM GOAL #1   Title Patient will be independent in home exercise program to improve strength/mobility for better functional independence with ADLs.    Baseline Pt does not have HEP    Time 4    Period Weeks    Status New    Target Date 12/12/20      PT SHORT TERM GOAL #2   Title Pt will improve FOT socre to at least 58 in order to demonstrate statistically signicicant improvement in mobility and quality of life    Baseline 53    Time 4    Period Weeks    Status New    Target Date 12/19/20      PT SHORT TERM GOAL #3   Title Patient will deny any falls over past 4 weeks to demonstrate improved safety awareness at home and work.    Baseline Pt reports 8 falls in the past 6 months    Time 4    Period Weeks    Status New    Target Date 12/19/20               PT Long Term Goals - 11/21/20 1454       PT LONG TERM GOAL #1   Title Pt will  improve self selected gait speed with LRAD to 1 m/s in order to indicate improved safety with  community ambulation    Baseline .59    Time 12    Period Weeks    Status New    Target Date 01/16/21      PT LONG TERM GOAL #2   Title Patient will increase six minute walk test distance to >1000 with LRAD for progression to community ambulator and improve gait ability    Baseline Pt required seated rest break following 300 foot of walking without AD and after 400 foot of walking with rollator    Time 12    Period Weeks    Status New    Target Date 01/16/21      PT LONG TERM GOAL #3   Title Pt will improve FOTO score by 12 points or greater in order to to indicate statistically significant improvement in mobility and quality of life    Baseline 53    Time 12    Period Weeks    Status New    Target Date 01/16/21                   Plan - 12/07/20 1712     Clinical Impression Statement Continued with current plan of care as laid out in evaluation and recent prior sessions. Pt remains motivated to advance progress toward goals in order to maximize independence and safety at home. Pt requires high level assistance and cuing for completion of exercises in order to provide adequate level of stimulation and perturbation. Author allows pt as much opportunity as possible to perform independent righting strategies, only stepping in when pt is unable to prevent falling to floor. Pt had a few LOB events in therapy and was able to self correct each event.  Pt continues to demonstrate progress toward goals AEB progression of some interventions this date either in volume or intensity.    Personal Factors and Comorbidities Age;Comorbidity 1    Examination-Activity Limitations Bend;Squat;Stand;Stairs    Examination-Participation Restrictions Community Activity    Stability/Clinical Decision Making Evolving/Moderate complexity    Clinical Decision Making Moderate    Rehab Potential Good    Clinical Impairments Affecting Rehab Potential motivated, good results from previous therapy    PT  Frequency 2x / week    PT Duration 12 weeks    PT Treatment/Interventions ADLs/Self Care Home Management;DME Instruction;Gait training;Stair training;Functional mobility training;Therapeutic activities;Therapeutic exercise;Balance training;Neuromuscular re-education;Passive range of motion;Energy conservation    PT Next Visit Plan Develop HEP next session    PT Home Exercise Plan HEP handout provided 11/30/20. Access Code: [email or web address]  URL: [email or web address]    Consulted and Agree with Plan of Care Patient             Patient will benefit from skilled therapeutic intervention in order to improve the following deficits and impairments:  Abnormal gait, Decreased balance, Decreased endurance, Decreased mobility, Decreased activity tolerance  Visit Diagnosis: Unsteadiness on feet  Difficulty in walking, not elsewhere classified     Problem List Patient Active Problem List   Diagnosis Date Noted   Hypogammaglobulinemia ([place]) 11/16/2020   Imbalance 11/13/2020   Renal insufficiency 09/13/2020   Right shoulder pain 09/12/2020   Vitamin B12 deficiency 09/06/2020   MDD (major depressive disorder), recurrent, in partial remission ([place]) 02/23/2019   Urinary incontinence 03/15/2018   Falls 03/15/2018   History of uterine cancer 01/22/2018   Prediabetes 10/14/2017   Hypothyroidism 04/07/2015   Vitamin D deficiency  04/02/2015   Macrocytosis 03/28/2015   Other fatigue 11/25/2014   Advanced care planning/counseling discussion 03/15/2014   Health maintenance examination 03/15/2014   Postsurgical menopause 08/07/2011   Medicare annual wellness visit, subsequent 08/06/2011   Anemia 06/07/2009   Osteoarthritis 06/07/2009   Narcolepsy without cataplexy 05/31/2008   HLD (hyperlipidemia) 06/03/2007   MDD (major depressive disorder), recurrent episode, severe ([place]) 06/03/2007   GERD with stricture 06/03/2007   Rivka Barbara PT, DPT   Particia Lather 12/07/2020, 5:16  PM  [place] MAIN [facility] SERVICES [address] [place], [place], [address] Phone: [phone]   Fax:  [phone]  Name: Faith Santiago MRN: [id number] Date of Birth: 24-May-1940

## 2020-12-12 ENCOUNTER — Other Ambulatory Visit: Payer: Self-pay

## 2020-12-12 ENCOUNTER — Ambulatory Visit: Payer: Medicare PPO | Admitting: Physical Therapy

## 2020-12-12 ENCOUNTER — Encounter: Payer: Self-pay | Admitting: Physical Therapy

## 2020-12-12 DIAGNOSIS — R2681 Unsteadiness on feet: Secondary | ICD-10-CM | POA: Diagnosis not present

## 2020-12-12 DIAGNOSIS — R262 Difficulty in walking, not elsewhere classified: Secondary | ICD-10-CM | POA: Diagnosis not present

## 2020-12-12 NOTE — Therapy (Signed)
[place] MAIN [facility] SERVICES [address] [place], [place], [address] Phone: [phone]   Fax:  [phone]  Physical Therapy Treatment  Patient Details  Name: Faith Santiago MRN: [id number] Date of Birth: December 18, 1940 Referring Provider (PT): [name]   Encounter Date: 12/12/2020   PT End of Session - 12/12/20 1353     Visit Number 5    Number of Visits 24    Date for PT Re-Evaluation 12/26/20    Authorization Type Medicare [employer]    Authorization Time Period 11/21/20-02/13/21    Progress Note Due on Visit 10    PT Start Time 1311    PT Stop Time 1347    PT Time Calculation (min) 36 min    Equipment Utilized During Treatment Gait belt    Activity Tolerance Patient tolerated treatment well;Patient limited by fatigue    Behavior During Therapy [facility] for tasks assessed/performed             Past Medical History:  Diagnosis Date   Anemia, unspecified    Anxiety    Arthritis    hands R>L, neck and lower back   Bimalleolar fracture of left ankle 11/19/2013   [place]   Cataract    COVID-19 05/2019   Depression    Esophageal reflux    GERD with stricture 06/03/2007   Glaucoma    History of osteopenia    DEXA WNL 2008, 2015   History of uterine cancer 1999   s/p hysterectomy   HLD (hyperlipidemia)    Narcolepsy    Thyroid disease    Uterine cancer ([place])     Past Surgical History:  Procedure Laterality Date   bilateral catarct removal  2010   COLONOSCOPY  05/2003   WNL (Dr Velora Heckler)   COLONOSCOPY  11/2017   3 small TA, diverticulisis, no f/u needed Carlean Purl)   DEXA  03/2014   WNL T score +3.9   ESOPHAGOGASTRODUODENOSCOPY  11/2017   dilated esoph stenosis, 8cm HH (Gessner)   ESOPHAGOGASTRODUODENOSCOPY  12/2017   repeat dilation of esoph stenosis, 5cm HH - rec stay on PPI Carlean Purl)   ESOPHAGOGASTRODUODENOSCOPY  12/2019   GERD with esophagitis and peptic stricture s/p dilation to 42m, mod sized HH - rec increase PPI  to BID x 1 yr due to ongoing active inflammation on EGD (Henrene Pastor   ORIF ANKLE FRACTURE Left 11/19/2013   Procedure: LEFT ANKLE FRACTURE OPEN TREATMENT BIMALLEOLAR ANKLE INCLUDES INTERNAL FIXATION ;  Surgeon: JJohnny Bridge MD   RETINAL DETACHMENT REPAIR W/ SCLERAL BUCKLE LE Right 03/21/2016   TOTAL ABDOMINAL HYSTERECTOMY  1999   uterine cancer    There were no vitals filed for this visit.   Subjective Assessment - 12/12/20 1315     Subjective pt reports no significant changes since previous session. Does report her children have conviced her and her husband to move into an independent living facility. pt also reports legs are sore from all of the walking involved, reports her thighs anteriorly are more sore compared to the rest of her legs.    Pertinent History Pt has referral scheduled with neuroloist about 8 weeks from initial evaluation on 8/9. Pt MD has been contacted via secure epic chat regarding her risk for polypharmacy. Pt has had multiple falls in the last 6 months, some resulting in minor head injuries and she reports many of them have occurreed when she is turning around.    Limitations Walking;Standing;House hold activities    How  long can you sit comfortably? NA    How long can you stand comfortably? increased pain with prolonged standing >30 min    How long can you walk comfortably? walking about 300-400 feet    Diagnostic tests none recent    Patient Stated Goals walk farther, reduce fall risk;     Currently in Pain? No/denies            Therex   2 x 10 x STS  without UE support, pt rated first set as mod and second set as hard (at end of set)  20 x HR with B HH support to improve ROM   Neuro Re Ed   The following activities were completed in parallel bars or at balance bar    Step up laterally to airex pad x 8 on ea LE with no HH assist, CGA provided, Cues for minimal use of hands, no LOB noted in today's visit   Tandem balance 2 x 30 sec with ea LE CGA, no LOB  evident today with this activity  Obstacle courses:   balance obstacle couse consisting of 1/2 foam roller for step over, airex pad for step onto and 3 cones weaving in and out. x 4 times through   balance obstacle couse consisting of 1/2 foam roller for step over, airex pad for step onto and 3 hedgehogs whe had to walk between to work on walking through narrow spaces x 4 times through    Pt required frequent rest breaks due fatigue, PT was quick to ask when pt appeared to be fatiguing in order to prevent excessive fatigue during or following session.                        PT Education - 12/12/20 1321     Education Details Pt educated regarding improtance of loqer extremity strngth with walking    Person(s) Educated Patient    Methods Explanation    Comprehension Verbalized understanding              PT Short Term Goals - 11/21/20 1450       PT SHORT TERM GOAL #1   Title Patient will be independent in home exercise program to improve strength/mobility for better functional independence with ADLs.    Baseline Pt does not have HEP    Time 4    Period Weeks    Status New    Target Date 12/12/20      PT SHORT TERM GOAL #2   Title Pt will improve FOT socre to at least 58 in order to demonstrate statistically signicicant improvement in mobility and quality of life    Baseline 53    Time 4    Period Weeks    Status New    Target Date 12/19/20      PT SHORT TERM GOAL #3   Title Patient will deny any falls over past 4 weeks to demonstrate improved safety awareness at home and work.    Baseline Pt reports 8 falls in the past 6 months    Time 4    Period Weeks    Status New    Target Date 12/19/20               PT Long Term Goals - 11/21/20 1454       PT LONG TERM GOAL #1   Title Pt will improve self selected gait speed with LRAD to 1 m/s in order to indicate  improved safety with community ambulation    Baseline .59    Time 12    Period Weeks     Status New    Target Date 01/16/21      PT LONG TERM GOAL #2   Title Patient will increase six minute walk test distance to >1000 with LRAD for progression to community ambulator and improve gait ability    Baseline Pt required seated rest break following 300 foot of walking without AD and after 400 foot of walking with rollator    Time 12    Period Weeks    Status New    Target Date 01/16/21      PT LONG TERM GOAL #3   Title Pt will improve FOTO score by 12 points or greater in order to to indicate statistically significant improvement in mobility and quality of life    Baseline 53    Time 12    Period Weeks    Status New    Target Date 01/16/21                   Plan - 12/12/20 1354     Clinical Impression Statement Continued with current plan of care as laid out in evaluation and recent prior sessions. Pt remains motivated to advance progress toward goals in order to maximize independence and safety at home. Pt requires high level assistance and cuing for completion of exercises in order to provide adequate level of stimulation and perturbation. Author allows pt as much opportunity as possible to perform independent righting strategies, only stepping in when pt is unable to prevent falling to floor. Pt had significantly fewer KIB events in today's session compared to previous sessions. Pt continues to demonstrate progress toward goals AEB progression of some interventions this date either in volume or intensity.    Personal Factors and Comorbidities Age;Comorbidity 1    Examination-Activity Limitations Bend;Squat;Stand;Stairs    Examination-Participation Restrictions Community Activity    Stability/Clinical Decision Making Evolving/Moderate complexity    Clinical Decision Making Moderate    Rehab Potential Good    Clinical Impairments Affecting Rehab Potential motivated, good results from previous therapy    PT Frequency 2x / week    PT Duration 12 weeks    PT  Treatment/Interventions ADLs/Self Care Home Management;DME Instruction;Gait training;Stair training;Functional mobility training;Therapeutic activities;Therapeutic exercise;Balance training;Neuromuscular re-education;Passive range of motion;Energy conservation    PT Next Visit Plan Develop HEP next session    PT Home Exercise Plan HEP handout provided 11/30/20. Access Code: [email or web address]  URL: [email or web address]    Consulted and Agree with Plan of Care Patient             Patient will benefit from skilled therapeutic intervention in order to improve the following deficits and impairments:  Abnormal gait, Decreased balance, Decreased endurance, Decreased mobility, Decreased activity tolerance  Visit Diagnosis: Unsteadiness on feet  Difficulty in walking, not elsewhere classified     Problem List Patient Active Problem List   Diagnosis Date Noted   Hypogammaglobulinemia ([place]) 11/16/2020   Imbalance 11/13/2020   Renal insufficiency 09/13/2020   Right shoulder pain 09/12/2020   Vitamin B12 deficiency 09/06/2020   MDD (major depressive disorder), recurrent, in partial remission ([place]) 02/23/2019   Urinary incontinence 03/15/2018   Falls 03/15/2018   History of uterine cancer 01/22/2018   Prediabetes 10/14/2017   Hypothyroidism 04/07/2015   Vitamin D deficiency 04/02/2015   Macrocytosis 03/28/2015   Other fatigue 11/25/2014   Advanced care planning/counseling discussion 03/15/2014  Health maintenance examination 03/15/2014   Postsurgical menopause 08/07/2011   Medicare annual wellness visit, subsequent 08/06/2011   Anemia 06/07/2009   Osteoarthritis 06/07/2009   Narcolepsy without cataplexy 05/31/2008   HLD (hyperlipidemia) 06/03/2007   MDD (major depressive disorder), recurrent episode, severe ([place]) 06/03/2007   GERD with stricture 06/03/2007   Rivka Barbara PT, DPT   Particia Lather 12/12/2020, 1:57 PM  [place]  MAIN [facility] SERVICES [address] [place], [place], [address] Phone: [phone]   Fax:  [phone]  Name: HASSANA WORNER MRN: [id number] Date of Birth: Jun 11, 1940

## 2020-12-14 ENCOUNTER — Ambulatory Visit: Payer: Medicare PPO | Attending: Family Medicine | Admitting: Physical Therapy

## 2020-12-14 ENCOUNTER — Ambulatory Visit (INDEPENDENT_AMBULATORY_CARE_PROVIDER_SITE_OTHER): Payer: Medicare PPO | Admitting: Licensed Clinical Social Worker

## 2020-12-14 ENCOUNTER — Other Ambulatory Visit: Payer: Self-pay

## 2020-12-14 DIAGNOSIS — R262 Difficulty in walking, not elsewhere classified: Secondary | ICD-10-CM | POA: Diagnosis not present

## 2020-12-14 DIAGNOSIS — F419 Anxiety disorder, unspecified: Secondary | ICD-10-CM

## 2020-12-14 DIAGNOSIS — F3342 Major depressive disorder, recurrent, in full remission: Secondary | ICD-10-CM

## 2020-12-14 DIAGNOSIS — R2681 Unsteadiness on feet: Secondary | ICD-10-CM | POA: Insufficient documentation

## 2020-12-14 NOTE — Progress Notes (Signed)
Virtual Visit via Video Note  I connected with NAHIA NISSAN on 12/14/20 at  3:00 PM EDT by a video enabled telemedicine application and verified that I am speaking with the correct person using two identifiers.  Location: Patient: home Provider: remote office [place], [place])   I discussed the limitations of evaluation and management by telemedicine and the availability of in person appointments. The patient expressed understanding and agreed to proceed.  I discussed the assessment and treatment plan with the patient. The patient was provided an opportunity to ask questions and all were answered. The patient agreed with the plan and demonstrated an understanding of the instructions.   The patient was advised to call back or seek an in-person evaluation if the symptoms worsen or if the condition fails to improve as anticipated.  I provided 30 minutes of non-face-to-face time during this encounter.   Kastin Cerda R Vannah Nadal, LCSW   THERAPIST PROGRESS NOTE  Session Time: 3-3:30p  Participation Level: Active  Behavioral Response: NAAlertEuthymic  Type of Therapy: Individual Therapy  Treatment Goals addressed: Anxiety  Interventions: CBT and Supportive  Summary: RENESMEE RAINE is a 80 y.o. female who presents with improving symptoms related to depression and anxiety diagnosis. Patient reports that overall mood has been stable, and patient feels that she is managing situational stress well. Patient reports good quality and quantity of sleep. Patient reports that she has had one small fall since last session. Patient reports that she is currently in physical therapy to help strengthen muscles in legs and improve balance. Patient was very pleased to report that she had a discussion with family members, and the decision has been made to move into an independent living facility in [place], [place]. patient reports that her husband has mixed feelings about it, but she is very happy about it. Offered  patient unconditional positive support. Discussed overall motivation and patient feels like the interventions that she has tried to improve her motivation Our tools that have been helpful in several situations. Reviewed overall treatment goals, and patient feels as if she has met many of her goals and feels like she's really in a good place emotionally right now. Patient will resume counseling sessions PRN.Marland Kitchen Continued recommendations are as follows: self care behaviors, positive social engagements, focusing on overall work/home/life balance, and focusing on positive physical and emotional wellness.    Suicidal/Homicidal: No  Therapist Response: Alleya is being intentional about engaging in recreational activities that reflect increased energy and interest. [place] is being an active participant in social/hobby groups. Suki is actively trying to problem solve and is breaking down larger projects in to smaller, more manageable projects. This is reflective of personal growth and progress. Treatment to continue  Plan: Return again PRN  Diagnosis: Axis I: MDD, recurrent, in remission; GAD    Axis II: No diagnosis    [place], LCSW 12/14/2020

## 2020-12-14 NOTE — Therapy (Signed)
[place] MAIN [facility] SERVICES [address] [place], [place], [address] Phone: [phone]   Fax:  [phone]  Physical Therapy Treatment  Patient Details  Name: Faith Santiago MRN: [id number] Date of Birth: 10-May-1940 Referring Provider (PT): [name]   Encounter Date: 12/14/2020   PT End of Session - 12/14/20 1455     Visit Number 7    Number of Visits 24    Date for PT Re-Evaluation 12/26/20    Authorization Type Medicare [employer]    Authorization Time Period 11/21/20-02/13/21    Progress Note Due on Visit 10    PT Start Time 1310    PT Stop Time 4270    PT Time Calculation (min) 35 min    Equipment Utilized During Treatment Gait belt    Activity Tolerance Patient tolerated treatment well;Patient limited by fatigue    Behavior During Therapy [facility] for tasks assessed/performed             Past Medical History:  Diagnosis Date   Anemia, unspecified    Anxiety    Arthritis    hands R>L, neck and lower back   Bimalleolar fracture of left ankle 11/19/2013   [place]   Cataract    COVID-19 05/2019   Depression    Esophageal reflux    GERD with stricture 06/03/2007   Glaucoma    History of osteopenia    DEXA WNL 2008, 2015   History of uterine cancer 1999   s/p hysterectomy   HLD (hyperlipidemia)    Narcolepsy    Thyroid disease    Uterine cancer ([place])     Past Surgical History:  Procedure Laterality Date   bilateral catarct removal  2010   COLONOSCOPY  05/2003   WNL (Dr Velora Heckler)   COLONOSCOPY  11/2017   3 small TA, diverticulisis, no f/u needed Carlean Purl)   DEXA  03/2014   WNL T score +3.9   ESOPHAGOGASTRODUODENOSCOPY  11/2017   dilated esoph stenosis, 8cm HH (Gessner)   ESOPHAGOGASTRODUODENOSCOPY  12/2017   repeat dilation of esoph stenosis, 5cm HH - rec stay on PPI Carlean Purl)   ESOPHAGOGASTRODUODENOSCOPY  12/2019   GERD with esophagitis and peptic stricture s/p dilation to 33mm, mod sized HH - rec increase PPI to  BID x 1 yr due to ongoing active inflammation on EGD Henrene Pastor)   ORIF ANKLE FRACTURE Left 11/19/2013   Procedure: LEFT ANKLE FRACTURE OPEN TREATMENT BIMALLEOLAR ANKLE INCLUDES INTERNAL FIXATION ;  Surgeon: Johnny Bridge, MD   RETINAL DETACHMENT REPAIR W/ SCLERAL BUCKLE LE Right 03/21/2016   TOTAL ABDOMINAL HYSTERECTOMY  1999   uterine cancer    There were no vitals filed for this visit.   Subjective Assessment - 12/14/20 1311     Subjective pt reports no significant changes since previous session. Does report her children have conviced her and her husband to move into an independent living facility. pt also reports legs are sore from all of the walking involved, reports her thighs anteriorly are more sore compared to the rest of her legs.    Pertinent History Pt has referral scheduled with neuroloist about 8 weeks from initial evaluation on 8/9. Pt MD has been contacted via secure epic chat regarding her risk for polypharmacy. Pt has had multiple falls in the last 6 months, some resulting in minor head injuries and she reports many of them have occurreed when she is turning around.    Limitations Walking;Standing;House hold activities    How  long can you sit comfortably? NA    How long can you stand comfortably? increased pain with prolonged standing >30 min    How long can you walk comfortably? walking about 300-400 feet    Diagnostic tests none recent    Patient Stated Goals walk farther, reduce fall risk;     Currently in Pain? No/denies                Treatment provided this session  There Act:  2 x 10 x STS  without UE support, pt rated first set as mod and second set as hard (at end of set)   Neuro Re Ed   The following activities were completed in parallel bars or at balance bar    HR/ Toe Raise 2 x 10 B HH A   Step up laterally to airex pad x 10 on ea LE  -with no HH assist, CGA provided, Cues for minimal use of hands, no LOB noted although pt intermittently utilized  UE for support    Tandem balance 2 x 30 sec with ea LE  -CGA, no LOB evident today with this activity  Stance with toes on 1/2 foam 3 x 30 sec -1st attempt pt required repeated use of UE to correct balance (met heads on foam) -Second and third trial pt instructed to use toes on foam to decrease DF.   Hurdle Step over x 10 ea LE -cues for large steps to clear trailing LE in swing phase   Airex stance with ball hand off laterally to PT 4 x 5 (2 x to each side)    Threr Act: Walking 4 min  -forward, backward, sidestepping, turning, as instructed -no LOB, pt had increased time turning around but began to show improved time and fewer steps with turning around following several repetitions   Unless otherwise stated, CGA was provided and gait belt donned in order to ensure pt safety                        PT Education - 12/14/20 1313     Education Details Importance of HEP following discharge from therapy services    Person(s) Educated Patient    Methods Explanation    Comprehension Verbalized understanding              PT Short Term Goals - 11/21/20 1450       PT SHORT TERM GOAL #1   Title Patient will be independent in home exercise program to improve strength/mobility for better functional independence with ADLs.    Baseline Pt does not have HEP    Time 4    Period Weeks    Status New    Target Date 12/12/20      PT SHORT TERM GOAL #2   Title Pt will improve FOT socre to at least 58 in order to demonstrate statistically signicicant improvement in mobility and quality of life    Baseline 53    Time 4    Period Weeks    Status New    Target Date 12/19/20      PT SHORT TERM GOAL #3   Title Patient will deny any falls over past 4 weeks to demonstrate improved safety awareness at home and work.    Baseline Pt reports 8 falls in the past 6 months    Time 4    Period Weeks    Status New    Target Date 12/19/20  PT Long Term  Goals - 11/21/20 1454       PT LONG TERM GOAL #1   Title Pt will improve self selected gait speed with LRAD to 1 m/s in order to indicate improved safety with community ambulation    Baseline .59    Time 12    Period Weeks    Status New    Target Date 01/16/21      PT LONG TERM GOAL #2   Title Patient will increase six minute walk test distance to >1000 with LRAD for progression to community ambulator and improve gait ability    Baseline Pt required seated rest break following 300 foot of walking without AD and after 400 foot of walking with rollator    Time 12    Period Weeks    Status New    Target Date 01/16/21      PT LONG TERM GOAL #3   Title Pt will improve FOTO score by 12 points or greater in order to to indicate statistically significant improvement in mobility and quality of life    Baseline 53    Time 12    Period Weeks    Status New    Target Date 01/16/21                   Plan - 12/14/20 1455     Clinical Impression Statement Continued with current plan of care as laid out in evaluation and recent prior sessions. Pt remains motivated to advance progress toward goals in order to maximize independence and safety at home. Pt requires high level assistance and cuing for completion of exercises in order to provide adequate level of stimulation and perturbation. Author allows pt as much opportunity as possible to perform independent righting strategies, only stepping in when pt is unable to prevent falling to floor. Pt continues to demonstrate fatigue with activity but is showing improvemnts in her gait and balance as evidenced with walking, direction, change, and backward walking at increased speed and without as much hesitancy as compared to her previous session. Pt continues to demonstrate progress toward goals AEB progression of some interventions this date either in volume or intensity.    Personal Factors and Comorbidities Age;Comorbidity 1     Examination-Activity Limitations Bend;Squat;Stand;Stairs    Examination-Participation Restrictions Community Activity    Stability/Clinical Decision Making Evolving/Moderate complexity    Clinical Decision Making Moderate    Rehab Potential Good    Clinical Impairments Affecting Rehab Potential motivated, good results from previous therapy    PT Frequency 2x / week    PT Duration 12 weeks    PT Treatment/Interventions ADLs/Self Care Home Management;DME Instruction;Gait training;Stair training;Functional mobility training;Therapeutic activities;Therapeutic exercise;Balance training;Neuromuscular re-education;Passive range of motion;Energy conservation    PT Next Visit Plan Develop HEP next session    PT Home Exercise Plan HEP handout provided 11/30/20. Access Code: [email or web address]  URL: [email or web address]             Patient will benefit from skilled therapeutic intervention in order to improve the following deficits and impairments:  Abnormal gait, Decreased balance, Decreased endurance, Decreased mobility, Decreased activity tolerance  Visit Diagnosis: Unsteadiness on feet  Difficulty in walking, not elsewhere classified     Problem List Patient Active Problem List   Diagnosis Date Noted   Hypogammaglobulinemia ([place]) 11/16/2020   Imbalance 11/13/2020   Renal insufficiency 09/13/2020   Right shoulder pain 09/12/2020   Vitamin B12 deficiency 09/06/2020   MDD (major depressive disorder), recurrent,  in partial remission ([place]) 02/23/2019   Urinary incontinence 03/15/2018   Falls 03/15/2018   History of uterine cancer 01/22/2018   Prediabetes 10/14/2017   Hypothyroidism 04/07/2015   Vitamin D deficiency 04/02/2015   Macrocytosis 03/28/2015   Other fatigue 11/25/2014   Advanced care planning/counseling discussion 03/15/2014   Health maintenance examination 03/15/2014   Postsurgical menopause 08/07/2011   Medicare annual wellness visit, subsequent 08/06/2011    Anemia 06/07/2009   Osteoarthritis 06/07/2009   Narcolepsy without cataplexy 05/31/2008   HLD (hyperlipidemia) 06/03/2007   MDD (major depressive disorder), recurrent episode, severe ([place]) 06/03/2007   GERD with stricture 06/03/2007   Rivka Barbara PT, DPT   Particia Lather 12/14/2020, 3:54 PM  [place] MAIN [facility] SERVICES [address] [place], [place], [address] Phone: [phone]   Fax:  [phone]  Name: Faith Santiago MRN: [id number] Date of Birth: Nov 01, 1940

## 2020-12-19 ENCOUNTER — Other Ambulatory Visit: Payer: Self-pay

## 2020-12-19 ENCOUNTER — Ambulatory Visit: Payer: Medicare PPO | Admitting: Physical Therapy

## 2020-12-19 DIAGNOSIS — R262 Difficulty in walking, not elsewhere classified: Secondary | ICD-10-CM

## 2020-12-19 DIAGNOSIS — R2681 Unsteadiness on feet: Secondary | ICD-10-CM

## 2020-12-19 NOTE — Therapy (Signed)
[place] MAIN [facility] SERVICES [address] [place], [place], [address] Phone: [phone]   Fax:  [phone]  Physical Therapy Treatment  Patient Details  Name: Faith Santiago MRN: [id number] Date of Birth: 1940/09/29 Referring Provider (PT): [name]   Encounter Date: 12/19/2020   PT End of Session - 12/19/20 1410     Visit Number 8    Number of Visits 24    Date for PT Re-Evaluation 12/26/20    Authorization Type Medicare [employer]    Authorization Time Period 11/21/20-02/13/21    Progress Note Due on Visit 10    PT Start Time 1345    Equipment Utilized During Treatment Gait belt    Activity Tolerance Patient tolerated treatment well;Patient limited by fatigue    Behavior During Therapy [facility] for tasks assessed/performed             Past Medical History:  Diagnosis Date   Anemia, unspecified    Anxiety    Arthritis    hands R>L, neck and lower back   Bimalleolar fracture of left ankle 11/19/2013   [place]   Cataract    COVID-19 05/2019   Depression    Esophageal reflux    GERD with stricture 06/03/2007   Glaucoma    History of osteopenia    DEXA WNL 2008, 2015   History of uterine cancer 1999   s/p hysterectomy   HLD (hyperlipidemia)    Narcolepsy    Thyroid disease    Uterine cancer ([place])     Past Surgical History:  Procedure Laterality Date   bilateral catarct removal  2010   COLONOSCOPY  05/2003   WNL (Dr Velora Heckler)   COLONOSCOPY  11/2017   3 small TA, diverticulisis, no f/u needed Carlean Purl)   DEXA  03/2014   WNL T score +3.9   ESOPHAGOGASTRODUODENOSCOPY  11/2017   dilated esoph stenosis, 8cm HH (Gessner)   ESOPHAGOGASTRODUODENOSCOPY  12/2017   repeat dilation of esoph stenosis, 5cm HH - rec stay on PPI Carlean Purl)   ESOPHAGOGASTRODUODENOSCOPY  12/2019   GERD with esophagitis and peptic stricture s/p dilation to 50m, mod sized HH - rec increase PPI to BID x 1 yr due to ongoing active inflammation on EGD  (Henrene Pastor   ORIF ANKLE FRACTURE Left 11/19/2013   Procedure: LEFT ANKLE FRACTURE OPEN TREATMENT BIMALLEOLAR ANKLE INCLUDES INTERNAL FIXATION ;  Surgeon: JJohnny Bridge MD   RETINAL DETACHMENT REPAIR W/ SCLERAL BUCKLE LE Right 03/21/2016   TOTAL ABDOMINAL HYSTERECTOMY  1999   uterine cancer    There were no vitals filed for this visit.   Therex:   2 x 10 x STS  without UE support, pt rated first set as mod and second set as hard (at end of set)   Neuro Re Ed   The following activities were completed in parallel bars or at balance bar    Step up laterally to airex pad x 10 on ea LE  -with no HH assist, CGA provided, Cues for minimal use of hands, no LOB noted although pt intermittently utilized UE for support    Tandem balance 2 x 30 sec with ea LE  -CGA, no LOB evident today with this activity, a few instances where pt had to use hands to regain balance  airex stance with ball hand off laterally to PT 2 x 4 to each side  -2KG medicine ball to increase weight shift perturbation caused by ball   Hurdle step over on  6 in step x 6 with UE use as needed. To work further on foot clearance   Walking 2 x 3 min -forward, backward, sidestepping, turning, increasing speed, as instructed -Improved ambulation speed with backward walking today  -with fatigue or increase in speed pt began to show decreased base of support slight scissoring gait.    Unless otherwise stated, CGA was provided and gait belt donned in order to ensure pt safety Pt required occasional rest breaks due fatigue, PT was quick to ask when pt appeared to be fatiguing in order to prevent excessive fatigue.                          PT Short Term Goals - 11/21/20 1450       PT SHORT TERM GOAL #1   Title Patient will be independent in home exercise program to improve strength/mobility for better functional independence with ADLs.    Baseline Pt does not have HEP    Time 4    Period Weeks    Status  New    Target Date 12/12/20      PT SHORT TERM GOAL #2   Title Pt will improve FOT socre to at least 58 in order to demonstrate statistically signicicant improvement in mobility and quality of life    Baseline 53    Time 4    Period Weeks    Status New    Target Date 12/19/20      PT SHORT TERM GOAL #3   Title Patient will deny any falls over past 4 weeks to demonstrate improved safety awareness at home and work.    Baseline Pt reports 8 falls in the past 6 months    Time 4    Period Weeks    Status New    Target Date 12/19/20               PT Long Term Goals - 11/21/20 1454       PT LONG TERM GOAL #1   Title Pt will improve self selected gait speed with LRAD to 1 m/s in order to indicate improved safety with community ambulation    Baseline .59    Time 12    Period Weeks    Status New    Target Date 01/16/21      PT LONG TERM GOAL #2   Title Patient will increase six minute walk test distance to >1000 with LRAD for progression to community ambulator and improve gait ability    Baseline Pt required seated rest break following 300 foot of walking without AD and after 400 foot of walking with rollator    Time 12    Period Weeks    Status New    Target Date 01/16/21      PT LONG TERM GOAL #3   Title Pt will improve FOTO score by 12 points or greater in order to to indicate statistically significant improvement in mobility and quality of life    Baseline 53    Time 12    Period Weeks    Status New    Target Date 01/16/21                   Plan - 12/19/20 1439     Clinical Impression Statement Continued with current plan of care as laid out in evaluation and recent prior sessions. Pt remains motivated to advance progress toward goals in order to maximize independence  and safety at home. Pt requires high level assistance and cuing for completion of exercises in order to provide adequate level of stimulation and perturbation. Author allows pt as much  opportunity as possible to perform independent righting strategies, only stepping in when pt is unable to prevent falling to floor. Pt continues to demonstrate fatigue with activity but is showing improvemnts in her gait and balance as evidenced with walking, direction, change, and backward walking at increased speed as compared to her previous session. Pt continues to demonstrate progress toward goals AEB progression of some interventions this date either in volume or    Personal Factors and Comorbidities Age;Comorbidity 1    Examination-Activity Limitations Bend;Squat;Stand;Stairs    Examination-Participation Restrictions Community Activity    Stability/Clinical Decision Making Evolving/Moderate complexity    Clinical Decision Making Moderate    Rehab Potential Good    Clinical Impairments Affecting Rehab Potential motivated, good results from previous therapy    PT Frequency 2x / week    PT Duration 12 weeks    PT Treatment/Interventions ADLs/Self Care Home Management;DME Instruction;Gait training;Stair training;Functional mobility training;Therapeutic activities;Therapeutic exercise;Balance training;Neuromuscular re-education;Passive range of motion;Energy conservation    PT Next Visit Plan Develop HEP next session    PT Home Exercise Plan HEP handout provided 11/30/20. Access Code: [email or web address]  URL: [email or web address]    Consulted and Agree with Plan of Care Patient             Patient will benefit from skilled therapeutic intervention in order to improve the following deficits and impairments:  Abnormal gait, Decreased balance, Decreased endurance, Decreased mobility, Decreased activity tolerance  Visit Diagnosis: Unsteadiness on feet  Difficulty in walking, not elsewhere classified     Problem List Patient Active Problem List   Diagnosis Date Noted   Hypogammaglobulinemia ([place]) 11/16/2020   Imbalance 11/13/2020   Renal insufficiency 09/13/2020   Right shoulder  pain 09/12/2020   Vitamin B12 deficiency 09/06/2020   MDD (major depressive disorder), recurrent, in partial remission ([place]) 02/23/2019   Urinary incontinence 03/15/2018   Falls 03/15/2018   History of uterine cancer 01/22/2018   Prediabetes 10/14/2017   Hypothyroidism 04/07/2015   Vitamin D deficiency 04/02/2015   Macrocytosis 03/28/2015   Other fatigue 11/25/2014   Advanced care planning/counseling discussion 03/15/2014   Health maintenance examination 03/15/2014   Postsurgical menopause 08/07/2011   Medicare annual wellness visit, subsequent 08/06/2011   Anemia 06/07/2009   Osteoarthritis 06/07/2009   Narcolepsy without cataplexy 05/31/2008   HLD (hyperlipidemia) 06/03/2007   MDD (major depressive disorder), recurrent episode, severe ([place]) 06/03/2007   GERD with stricture 06/03/2007   Rivka Barbara PT, DPT   Particia Lather 12/19/2020, 2:41 PM  [place] MAIN [facility] SERVICES [place], [place], [address] Phone: [phone]   Fax:  [phone]  Name: BRENNAN OSTREM MRN: [id number] Date of Birth: Nov 24, 1940

## 2020-12-21 ENCOUNTER — Telehealth: Payer: Self-pay

## 2020-12-21 ENCOUNTER — Ambulatory Visit: Payer: Medicare PPO | Admitting: Physical Therapy

## 2020-12-21 ENCOUNTER — Other Ambulatory Visit: Payer: Self-pay

## 2020-12-21 DIAGNOSIS — R262 Difficulty in walking, not elsewhere classified: Secondary | ICD-10-CM | POA: Diagnosis not present

## 2020-12-21 DIAGNOSIS — R2681 Unsteadiness on feet: Secondary | ICD-10-CM

## 2020-12-21 NOTE — Telephone Encounter (Signed)
Physician form dropped off for pt. Told it needs to be faxed.  Placed form in Dr. Synthia Innocent box.

## 2020-12-21 NOTE — Therapy (Signed)
[place] MAIN [facility] SERVICES [address] [place], [place], [address] Phone: [phone]   Fax:  [phone]  Physical Therapy Treatment  Patient Details  Name: Faith Santiago MRN: [id number] Date of Birth: [birth year] Referring Provider (PT): [name]   Encounter Date: 12/21/2020   PT End of Session - 12/21/20 1445     Visit Number 9    Number of Visits 24    Date for PT Re-Evaluation 12/26/20    Authorization Type Medicare [employer]    Authorization Time Period 11/21/20-02/13/21    Progress Note Due on Visit 10    PT Start Time 1352    PT Stop Time [id number]    PT Time Calculation (min) 39 min    Equipment Utilized During Treatment Gait belt    Activity Tolerance Patient tolerated treatment well;Patient limited by fatigue    Behavior During Therapy [facility] for tasks assessed/performed             Past Medical History:  Diagnosis Date   Anemia, unspecified    Anxiety    Arthritis    hands R>L, neck and lower back   Bimalleolar fracture of left ankle 11/19/2013   [place]   Cataract    COVID-19 05/2019   Depression    Esophageal reflux    GERD with stricture 06/03/2007   Glaucoma    History of osteopenia    DEXA WNL 2008, 2015   History of uterine cancer 1999   s/p hysterectomy   HLD (hyperlipidemia)    Narcolepsy    Thyroid disease    Uterine cancer ([place])     Past Surgical History:  Procedure Laterality Date   bilateral catarct removal  2010   COLONOSCOPY  05/2003   WNL (Dr Velora Heckler)   COLONOSCOPY  11/2017   3 small TA, diverticulisis, no f/u needed Carlean Purl)   DEXA  03/2014   WNL T score +3.9   ESOPHAGOGASTRODUODENOSCOPY  11/2017   dilated esoph stenosis, 8cm HH (Gessner)   ESOPHAGOGASTRODUODENOSCOPY  12/2017   repeat dilation of esoph stenosis, 5cm HH - rec stay on PPI Carlean Purl)   ESOPHAGOGASTRODUODENOSCOPY  12/2019   GERD with esophagitis and peptic stricture s/p dilation to 83m, mod sized HH - rec increase PPI to  BID x 1 yr due to ongoing active inflammation on EGD (Henrene Pastor   ORIF ANKLE FRACTURE Left 11/19/2013   Procedure: LEFT ANKLE FRACTURE OPEN TREATMENT BIMALLEOLAR ANKLE INCLUDES INTERNAL FIXATION ;  Surgeon: JJohnny Bridge MD   RETINAL DETACHMENT REPAIR W/ SCLERAL BUCKLE LE Right 03/21/2016   TOTAL ABDOMINAL HYSTERECTOMY  1999   uterine cancer    There were no vitals filed for this visit.   Subjective Assessment - 12/21/20 1354     Subjective Pt reports she is doing well. She has been a little sore following her last physical therpay session. No falls or LOB since the last visit.    Pertinent History Pt has referral scheduled with neuroloist about 8 weeks from initial evaluation on 8/9. Pt MD has been contacted via secure epic chat regarding her risk for polypharmacy. Pt has had multiple falls in the last 6 months, some resulting in minor head injuries and she reports many of them have occurreed when she is turning around.    Limitations Walking;Standing;House hold activities    How long can you stand comfortably? increased pain with prolonged standing >30 min    Patient Stated Goals walk farther, reduce fall risk;  Currently in Pain? No/denies    Pain Score 0-No pain               Therex:    2 x 10 x STS  without UE support, pt rated first set as mod and second set as hard (at end of set)   Walking 2 x 3 min -with fatigue or increase in speed pt began to show decreased base of support slight scissoring gait.    Neuro Re Ed   The following activities were completed in parallel bars or at balance bar    Step up anteriorly an dlaterally laterally to airex pad x 10 on ea LE  -with no HH assist, CGA provided, Cues for minimal use of hands -1 LOB noted and pt ablet o self recover with use of stepping strategy    Forward reach while standing on airex pad  -utilized objets in front for cone stacking - moved 8 cones from right to left side while challenging forward reaching  capabilities  Balance course with 2 airex pads and airex balance beam 8 x through with rest break at 4th time through course  -turned around on airex pad and walked on balance beam -min use if UE  -able to perform 180 degree turn on airex pad x 4 without UE use   Walking over obstacles 2 wood blocks and 1 hurdle X 4 times through,  -alternating which LE was leading in order to improve foot clearance to prevent falls   Unless otherwise stated, CGA was provided and gait belt donned in order to ensure pt safety Pt required occasional rest breaks due fatigue, PT was quick to ask when pt appeared to be fatiguing in order to prevent excessive fatigue.                         PT Education - 12/21/20 1357     Education Details Pt educatd regarding DOMS and exercise    Person(s) Educated Patient    Methods Explanation    Comprehension Verbalized understanding              PT Short Term Goals - 11/21/20 1450       PT SHORT TERM GOAL #1   Title Patient will be independent in home exercise program to improve strength/mobility for better functional independence with ADLs.    Baseline Pt does not have HEP    Time 4    Period Weeks    Status New    Target Date 12/12/20      PT SHORT TERM GOAL #2   Title Pt will improve FOT socre to at least 58 in order to demonstrate statistically signicicant improvement in mobility and quality of life    Baseline 53    Time 4    Period Weeks    Status New    Target Date 12/19/20      PT SHORT TERM GOAL #3   Title Patient will deny any falls over past 4 weeks to demonstrate improved safety awareness at home and work.    Baseline Pt reports 8 falls in the past 6 months    Time 4    Period Weeks    Status New    Target Date 12/19/20               PT Long Term Goals - 11/21/20 1454       PT LONG TERM GOAL #1   Title Pt will  improve self selected gait speed with LRAD to 1 m/s in order to indicate improved safety with  community ambulation    Baseline .59    Time 12    Period Weeks    Status New    Target Date 01/16/21      PT LONG TERM GOAL #2   Title Patient will increase six minute walk test distance to >1000 with LRAD for progression to community ambulator and improve gait ability    Baseline Pt required seated rest break following 300 foot of walking without AD and after 400 foot of walking with rollator    Time 12    Period Weeks    Status New    Target Date 01/16/21      PT LONG TERM GOAL #3   Title Pt will improve FOTO score by 12 points or greater in order to to indicate statistically significant improvement in mobility and quality of life    Baseline 53    Time 12    Period Weeks    Status New    Target Date 01/16/21                   Plan - 12/21/20 1406     Clinical Impression Statement Continued with current plan of care as laid out in evaluation and recent prior sessions. Pt remains motivated to advance progress toward goals in order to maximize independence and safety at home. Pt requires high level assistance and cuing for completion of exercises in order to provide adequate level of stimulation and perturbation. Author allows pt as much opportunity as possible to perform independent righting strategies, only stepping in when pt is unable to prevent falling to floor. Pt closely monitored throughout session for safe vitals response and to maximize patient safety during interventions. Pt reports improved stability within her home and she has had increased confidence and improved balance reactions to perturbations at home which she believes could have caused her to fall in the past. Pt still showing decreased stability and increased sway when she is fatigued.  Pt continues to demonstrate progress toward goals AEB progression of some interventions this date either in volume or intensity.    Personal Factors and Comorbidities Age;Comorbidity 1    Examination-Activity Limitations  Bend;Squat;Stand;Stairs    Examination-Participation Restrictions Community Activity    Stability/Clinical Decision Making Evolving/Moderate complexity    Clinical Decision Making Moderate    Rehab Potential Good    Clinical Impairments Affecting Rehab Potential motivated, good results from previous therapy    PT Frequency 2x / week    PT Duration 12 weeks    PT Treatment/Interventions ADLs/Self Care Home Management;DME Instruction;Gait training;Stair training;Functional mobility training;Therapeutic activities;Therapeutic exercise;Balance training;Neuromuscular re-education;Passive range of motion;Energy conservation    PT Next Visit Plan Progress note next session    PT Home Exercise Plan HEP handout provided 11/30/20. Access Code: [email or web address]  URL: [email or web address]    Consulted and Agree with Plan of Care Patient             Patient will benefit from skilled therapeutic intervention in order to improve the following deficits and impairments:  Abnormal gait, Decreased balance, Decreased endurance, Decreased mobility, Decreased activity tolerance  Visit Diagnosis: Unsteadiness on feet  Difficulty in walking, not elsewhere classified     Problem List Patient Active Problem List   Diagnosis Date Noted   Hypogammaglobulinemia ([place]) 11/16/2020   Imbalance 11/13/2020   Renal insufficiency 09/13/2020   Right shoulder pain 09/12/2020  Vitamin B12 deficiency 09/06/2020   MDD (major depressive disorder), recurrent, in partial remission ([place]) 02/23/2019   Urinary incontinence 03/15/2018   Falls 03/15/2018   History of uterine cancer 01/22/2018   Prediabetes 10/14/2017   Hypothyroidism 04/07/2015   Vitamin D deficiency 04/02/2015   Macrocytosis 03/28/2015   Other fatigue 11/25/2014   Advanced care planning/counseling discussion 03/15/2014   Health maintenance examination 03/15/2014   Postsurgical menopause 08/07/2011   Medicare annual wellness visit, subsequent  08/06/2011   Anemia 06/07/2009   Osteoarthritis 06/07/2009   Narcolepsy without cataplexy 05/31/2008   HLD (hyperlipidemia) 06/03/2007   MDD (major depressive disorder), recurrent episode, severe ([place]) 06/03/2007   GERD with stricture 06/03/2007    Particia Lather, PT 12/21/2020, 2:55 PM  [place] MAIN [facility] SERVICES [address] [place], [place], [address] Phone: [phone]   Fax:  [phone]  Name: LAVREN CHIAPPONE MRN: [id number] Date of Birth: 08/13/1940

## 2020-12-25 NOTE — Telephone Encounter (Addendum)
Filled and in Lisa's box.  Seems they will need this faxed to [facility] at [phone].

## 2020-12-26 ENCOUNTER — Other Ambulatory Visit: Payer: Self-pay

## 2020-12-26 ENCOUNTER — Ambulatory Visit: Payer: Medicare PPO | Admitting: Physical Therapy

## 2020-12-26 DIAGNOSIS — R2681 Unsteadiness on feet: Secondary | ICD-10-CM | POA: Diagnosis not present

## 2020-12-26 DIAGNOSIS — R262 Difficulty in walking, not elsewhere classified: Secondary | ICD-10-CM

## 2020-12-26 NOTE — Therapy (Signed)
[place] MAIN [facility] SERVICES [address] [place], [place], [address] Phone: [phone]   Fax:  [phone]  Physical Therapy Treatment / Physical Therapy Progress Note   Dates of reporting period  11/21/20   to   12/26/20   Patient Details  Name: Faith Santiago MRN: [id number] Date of Birth: [birth year] Referring Provider (PT): [name]   Encounter Date: 12/26/2020   PT End of Session - 12/26/20 1525     Visit Number 10    Number of Visits 24    Date for PT Re-Evaluation 12/26/20    Authorization Type Medicare [employer]    Authorization Time Period 11/21/20-02/13/21    Progress Note Due on Visit 10    PT Start Time 1335    PT Stop Time 1415    PT Time Calculation (min) 40 min    Equipment Utilized During Treatment Gait belt    Activity Tolerance Patient tolerated treatment well;Patient limited by fatigue    Behavior During Therapy WFL for tasks assessed/performed             Past Medical History:  Diagnosis Date   Anemia, unspecified    Anxiety    Arthritis    hands R>L, neck and lower back   Bimalleolar fracture of left ankle 11/19/2013   [place]   Cataract    COVID-19 05/2019   Depression    Esophageal reflux    GERD with stricture 06/03/2007   Glaucoma    History of osteopenia    DEXA WNL 2008, 2015   History of uterine cancer 1999   s/p hysterectomy   HLD (hyperlipidemia)    Narcolepsy    Thyroid disease    Uterine cancer ([place])     Past Surgical History:  Procedure Laterality Date   bilateral catarct removal  2010   COLONOSCOPY  05/2003   WNL (Dr Velora Heckler)   COLONOSCOPY  11/2017   3 small TA, diverticulisis, no f/u needed Carlean Purl)   DEXA  03/2014   WNL T score +3.9   ESOPHAGOGASTRODUODENOSCOPY  11/2017   dilated esoph stenosis, 8cm HH (Gessner)   ESOPHAGOGASTRODUODENOSCOPY  12/2017   repeat dilation of esoph stenosis, 5cm HH - rec stay on PPI Carlean Purl)   ESOPHAGOGASTRODUODENOSCOPY  12/2019   GERD  with esophagitis and peptic stricture s/p dilation to 89m, mod sized HH - rec increase PPI to BID x 1 yr due to ongoing active inflammation on EGD (Henrene Pastor   ORIF ANKLE FRACTURE Left 11/19/2013   Procedure: LEFT ANKLE FRACTURE OPEN TREATMENT BIMALLEOLAR ANKLE INCLUDES INTERNAL FIXATION ;  Surgeon: JJohnny Bridge MD   RETINAL DETACHMENT REPAIR W/ SCLERAL BUCKLE LE Right 03/21/2016   TOTAL ABDOMINAL HYSTERECTOMY  1999   uterine cancer    There were no vitals filed for this visit.   Subjective Assessment - 12/26/20 1338     Subjective Pt reports feeling okay folloiwng previous physical therpay appointment. No falls or LOB since the last visit. Dies report she would like further information regarding AD utilization.    Pertinent History Pt has referral scheduled with neuroloist about 8 weeks from initial evaluation on 8/9. Pt MD has been contacted via secure epic chat regarding her risk for polypharmacy. Pt has had multiple falls in the last 6 months, some resulting in minor head injuries and she reports many of them have occurreed when she is turning around.    Limitations Walking;Standing;House hold activities    How long can you sit  comfortably? NA    How long can you stand comfortably? increased pain with prolonged standing >30 min    How long can you walk comfortably? walking about 300-400 feet    Diagnostic tests none recent    Patient Stated Goals walk farther, reduce fall risk;     Currently in Pain? No/denies    Pain Score 0-No pain    Multiple Pain Sites No              Ther ex:   6 MWT : 800 feet total distance ambulated.  -around 4: 30 pt showed signs of fatigue, pt elected to finish test but was offered rest breaks in either standing or sitting frequently during last 2 minutes of test.  -with fatigue pt showed slight increase in forward lean as well as slight narrowing of base of support.  As measured in middle o test pt showed gait speed of .72 m/s   2 x 10 x STS   without UE support, pt rated first set as mod and second set as hard (at end of set)   Neuro Re Ed  The following activities were completed in parallel bars or at balance bar   Hurdle step overs anteriorly x 10 with B HH support -second set no HH support, occasional LOB to right with pt self correction via use of her UE on balance bar      Unless otherwise stated, CGA was provided and gait belt donned in order to ensure pt safety  Pt educated throughout session about proper posture and technique with exercises. Improved exercise technique, movement at target joints, use of target muscles after min to mod verbal, visual, tactile cues.                       PT Education - 12/26/20 1524     Education Details Pt educated regarding progress and continued POC    Person(s) Educated Patient    Methods Explanation    Comprehension Verbalized understanding              PT Short Term Goals - 12/26/20 1343       PT SHORT TERM GOAL #1   Title Patient will be independent in home exercise program to improve strength/mobility for better functional independence with ADLs.    Baseline Pt is independent with hep    Time 4    Period Weeks    Status Achieved      PT SHORT TERM GOAL #2   Title Pt will improve FOTO socre to at least 58 in order to demonstrate statistically signicicant improvement in mobility and quality of life    Baseline 53 IE, 51 on 12/26/20    Time 4    Period Weeks    Status On-going    Target Date 01/16/21      PT SHORT TERM GOAL #3   Title Patient will deny any falls over past 4 weeks to demonstrate improved safety awareness at home and work.    Baseline Repots 1 fall about 4 weeks ago at church as previously documented    Time 4    Status On-going    Target Date 01/16/21               PT Long Term Goals - 12/26/20 1400       PT LONG TERM GOAL #1   Title Pt will improve self selected gait speed with LRAD to 1 m/s in order to  indicate  improved safety with community ambulation    Baseline .72    Time 12    Period Weeks    Status On-going      PT LONG TERM GOAL #2   Title Patient will increase six minute walk test distance to >1000 with LRAD for progression to community ambulator and improve gait ability    Baseline 800 feet witout AD on 9/19 at PN    Time 12    Period Weeks    Status On-going    Target Date 01/16/21      PT LONG TERM GOAL #3   Title Pt will improve FOTO score to 59 points or greater in order to to indicate statistically significant improvement in mobility and quality of life    Baseline 51 at PN on 09/13    Time 12    Period Weeks    Status Revised    Target Date 01/16/21                   Plan - 12/26/20 1526     Clinical Impression Statement Pt is a 80 y.o. female who presents to therapy for PN following 10 physical therapy session. Pt reports she has had 1 fall since the onset of therapy but she has noticed some increase in her balance confidence, leg strength and endurance. With 6MWT pt displayed improved LE endurance and strength as evidenced by ability to complete test. Pt did show some fatigue around 4 minute mark but despite fatigue was able to complete test safely. As she fatigued she began to display slight anterior lean as well as narrow base of support. Pt has lower score with FOTO assessment tool but overall believes she is improving slowly but surely. Patient's condition has the potential to improve in response to therapy. Maximum improvement is yet to be obtained. The anticipated improvement is attainable and reasonable in a generally predictable time.   Pt will continue to benefit from skilled physical therapy intervention to address his impairments, improve her QOL, and attain her  therapy goals.     Personal Factors and Comorbidities Age;Comorbidity 1    Examination-Activity Limitations Bend;Squat;Stand;Stairs    Examination-Participation Restrictions Community Activity     Stability/Clinical Decision Making Evolving/Moderate complexity    Clinical Decision Making Moderate    Rehab Potential Good    Clinical Impairments Affecting Rehab Potential motivated, good results from previous therapy    PT Frequency 2x / week    PT Duration 12 weeks    PT Treatment/Interventions ADLs/Self Care Home Management;DME Instruction;Gait training;Stair training;Functional mobility training;Therapeutic activities;Therapeutic exercise;Balance training;Neuromuscular re-education;Passive range of motion;Energy conservation    PT Next Visit Plan AD trial next session    PT Home Exercise Plan HEP handout provided 11/30/20. Access Code: [email or web address]  URL: [email or web address]             Patient will benefit from skilled therapeutic intervention in order to improve the following deficits and impairments:  Abnormal gait, Decreased balance, Decreased endurance, Decreased mobility, Decreased activity tolerance  Visit Diagnosis: Unsteadiness on feet  Difficulty in walking, not elsewhere classified     Problem List Patient Active Problem List   Diagnosis Date Noted   Hypogammaglobulinemia ([place]) 11/16/2020   Imbalance 11/13/2020   Renal insufficiency 09/13/2020   Right shoulder pain 09/12/2020   Vitamin B12 deficiency 09/06/2020   MDD (major depressive disorder), recurrent, in partial remission ([place]) 02/23/2019   Urinary incontinence 03/15/2018   Falls 03/15/2018   History of uterine  cancer 01/22/2018   Prediabetes 10/14/2017   Hypothyroidism 04/07/2015   Vitamin D deficiency 04/02/2015   Macrocytosis 03/28/2015   Other fatigue 11/25/2014   Advanced care planning/counseling discussion 03/15/2014   Health maintenance examination 03/15/2014   Postsurgical menopause 08/07/2011   Medicare annual wellness visit, subsequent 08/06/2011   Anemia 06/07/2009   Osteoarthritis 06/07/2009   Narcolepsy without cataplexy 05/31/2008   HLD (hyperlipidemia) 06/03/2007   MDD  (major depressive disorder), recurrent episode, severe ([place]) 06/03/2007   GERD with stricture 06/03/2007    Particia Lather, PT 12/26/2020, 3:27 PM  [place] MAIN [facility] SERVICES [address] [place], [place], [address] Phone: [phone]   Fax:  [phone]  Name: ALYSSAMAE STOCUM MRN: [id number] Date of Birth: 1940-10-16

## 2020-12-27 DIAGNOSIS — R030 Elevated blood-pressure reading, without diagnosis of hypertension: Secondary | ICD-10-CM | POA: Diagnosis not present

## 2020-12-27 DIAGNOSIS — F325 Major depressive disorder, single episode, in full remission: Secondary | ICD-10-CM | POA: Diagnosis not present

## 2020-12-27 DIAGNOSIS — E785 Hyperlipidemia, unspecified: Secondary | ICD-10-CM | POA: Diagnosis not present

## 2020-12-27 DIAGNOSIS — E039 Hypothyroidism, unspecified: Secondary | ICD-10-CM | POA: Diagnosis not present

## 2020-12-27 DIAGNOSIS — N3941 Urge incontinence: Secondary | ICD-10-CM | POA: Diagnosis not present

## 2020-12-27 DIAGNOSIS — Z6833 Body mass index (BMI) 33.0-33.9, adult: Secondary | ICD-10-CM | POA: Diagnosis not present

## 2020-12-27 DIAGNOSIS — E669 Obesity, unspecified: Secondary | ICD-10-CM | POA: Diagnosis not present

## 2020-12-27 DIAGNOSIS — K219 Gastro-esophageal reflux disease without esophagitis: Secondary | ICD-10-CM | POA: Diagnosis not present

## 2020-12-27 DIAGNOSIS — F419 Anxiety disorder, unspecified: Secondary | ICD-10-CM | POA: Diagnosis not present

## 2020-12-27 NOTE — Telephone Encounter (Signed)
Faxed form.  Spoke with pt's husband, Percell Miller (on dpr), notifying him pt's form was faxed.  Also, informed him both original form is being mailed to her for her records.  Verbalizes understanding and expresses his thanks.

## 2020-12-28 ENCOUNTER — Ambulatory Visit: Payer: Medicare PPO | Admitting: Physical Therapy

## 2020-12-28 ENCOUNTER — Other Ambulatory Visit: Payer: Self-pay

## 2020-12-28 DIAGNOSIS — R262 Difficulty in walking, not elsewhere classified: Secondary | ICD-10-CM

## 2020-12-28 DIAGNOSIS — R2681 Unsteadiness on feet: Secondary | ICD-10-CM

## 2020-12-28 NOTE — Therapy (Signed)
[place] MAIN [facility] SERVICES [address] [place], [place], [address] Phone: [phone]   Fax:  [phone]  Physical Therapy Treatment  Patient Details  Name: Faith Santiago MRN: [id number] Date of Birth: [birth year] Referring Provider (PT): [name]   Encounter Date: 12/28/2020   PT End of Session - 12/28/20 1420     Visit Number 11    Number of Visits 24    Date for PT Re-Evaluation 12/26/20    Authorization Type Medicare [employer]    Authorization Time Period 11/21/20-02/13/21    Progress Note Due on Visit 10    PT Start Time 1347    PT Stop Time 1433    PT Time Calculation (min) 46 min    Equipment Utilized During Treatment Gait belt    Activity Tolerance Patient tolerated treatment well;Patient limited by fatigue    Behavior During Therapy [facility] for tasks assessed/performed             Past Medical History:  Diagnosis Date   Anemia, unspecified    Anxiety    Arthritis    hands R>L, neck and lower back   Bimalleolar fracture of left ankle 11/19/2013   [place]   Cataract    COVID-19 05/2019   Depression    Esophageal reflux    GERD with stricture 06/03/2007   Glaucoma    History of osteopenia    DEXA WNL 2008, 2015   History of uterine cancer 1999   s/p hysterectomy   HLD (hyperlipidemia)    Narcolepsy    Thyroid disease    Uterine cancer ([place])     Past Surgical History:  Procedure Laterality Date   bilateral catarct removal  2010   COLONOSCOPY  05/2003   WNL (Dr Velora Heckler)   COLONOSCOPY  11/2017   3 small TA, diverticulisis, no f/u needed Carlean Purl)   DEXA  03/2014   WNL T score +3.9   ESOPHAGOGASTRODUODENOSCOPY  11/2017   dilated esoph stenosis, 8cm HH (Gessner)   ESOPHAGOGASTRODUODENOSCOPY  12/2017   repeat dilation of esoph stenosis, 5cm HH - rec stay on PPI Carlean Purl)   ESOPHAGOGASTRODUODENOSCOPY  12/2019   GERD with esophagitis and peptic stricture s/p dilation to 96m, mod sized HH - rec increase PPI  to BID x 1 yr due to ongoing active inflammation on EGD (Henrene Pastor   ORIF ANKLE FRACTURE Left 11/19/2013   Procedure: LEFT ANKLE FRACTURE OPEN TREATMENT BIMALLEOLAR ANKLE INCLUDES INTERNAL FIXATION ;  Surgeon: JJohnny Bridge MD   RETINAL DETACHMENT REPAIR W/ SCLERAL BUCKLE LE Right 03/21/2016   TOTAL ABDOMINAL HYSTERECTOMY  1999   uterine cancer    There were no vitals filed for this visit.   Subjective Assessment - 12/28/20 1351     Subjective Pt reports no significant changes since previous session. Pt reports she will continue to go through her house this weekend in preperation for moving day.    Pertinent History Pt has referral scheduled with neuroloist about 8 weeks from initial evaluation on 8/9. Pt MD has been contacted via secure epic chat regarding her risk for polypharmacy. Pt has had multiple falls in the last 6 months, some resulting in minor head injuries and she reports many of them have occurreed when she is turning around.    Limitations Walking;Standing;House hold activities    How long can you sit comfortably? NA    How long can you stand comfortably? increased pain with prolonged standing >30 min    How  long can you walk comfortably? walking about 300-400 feet    Diagnostic tests none recent    Patient Stated Goals walk farther, reduce fall risk;     Currently in Pain? No/denies    Pain Score 0-No pain            Treatment provided this session Gait training:  Upon patient request PT and patient trialed several assistive devices including Rollator and two SPC's.  We ambulated 450 feet with Rollator patient reported improvement in subjective complaints of fatigue.    When ambulating 150 feet with SPC and right upper extremity patient demonstrated decreased ambulation speed as well as impaired ability to make turns without altering gait pattern.  Ambulated 100 feet with SPC in left upper extremity and patient demonstrated further impaired gait pattern and slowed gait  pattern.  Overall PT recommends Rollator as assistive device of choice to 2 patient ability to rest and patient's improved ambulation speed and endurance with ambulation when trialing this device in the clinic today.    Patient educated regarding safety with use of all equipment.  Including height for handholds with each device.  As well as using brakes on Rollator when transitioning from sit to and from stand as well as if she intends to sit on a Rollator.  Therex:  STS 2 x 10  -No upper extremity use.  Patient had less complaints of fatigue compared to previous sessions with this activity.  Neuromuscular reeducation The following activities were completed in parallel bars or at balance bar   Airex balance beam activities -5X laps utilizing Airex balance beam.  Patient utilized handhold intermittently utilizing parallel bars. -5 x ea lower extremity step ups onto Airex beam.  Patient indicated this adequately simulated stepping up onto a curb which is something that she tends to have trouble with. -1 loss of balance when stepping down.  Patient able to self recover utilizing hands and stepping strategy.  Unless otherwise stated, CGA was provided and gait belt donned in order to ensure pt safety Pt educated throughout session about proper posture and technique with exercises. Improved exercise technique, movement at target joints, use of target muscles after min to mod verbal, visual, tactile cues. Pt required occasional rest breaks due fatigue, PT was quick to ask when pt appeared to be fatiguing in order to prevent excessive fatigue.                            PT Education - 12/28/20 1416     Education Details Pt educated regarding AD use and trialed several AD per pt request. Pt educated rolator is likely safest and best option due to fatigue limiting her ambulatory and functinoal abilities    Person(s) Educated Patient    Methods Explanation;Demonstration     Comprehension Verbalized understanding;Returned demonstration              PT Short Term Goals - 12/26/20 1343       PT SHORT TERM GOAL #1   Title Patient will be independent in home exercise program to improve strength/mobility for better functional independence with ADLs.    Baseline Pt is independent with hep    Time 4    Period Weeks    Status Achieved      PT SHORT TERM GOAL #2   Title Pt will improve FOTO socre to at least 58 in order to demonstrate statistically signicicant improvement in mobility and quality of life  Baseline 53 IE, 51 on 12/26/20    Time 4    Period Weeks    Status On-going    Target Date 01/16/21      PT SHORT TERM GOAL #3   Title Patient will deny any falls over past 4 weeks to demonstrate improved safety awareness at home and work.    Baseline Repots 1 fall about 4 weeks ago at church as previously documented    Time 4    Status On-going    Target Date 01/16/21               PT Long Term Goals - 12/26/20 1400       PT LONG TERM GOAL #1   Title Pt will improve self selected gait speed with LRAD to 1 m/s in order to indicate improved safety with community ambulation    Baseline .72    Time 12    Period Weeks    Status On-going      PT LONG TERM GOAL #2   Title Patient will increase six minute walk test distance to >1000 with LRAD for progression to community ambulator and improve gait ability    Baseline 800 feet witout AD on 9/19 at PN    Time 12    Period Weeks    Status On-going    Target Date 01/16/21      PT LONG TERM GOAL #3   Title Pt will improve FOTO score to 59 points or greater in order to to indicate statistically significant improvement in mobility and quality of life    Baseline 51 at PN on 09/13    Time 12    Period Weeks    Status Revised    Target Date 01/16/21                   Plan - 12/28/20 1423     Clinical Impression Statement Patient trialed several assistive devices today including  standard Rollator standard cane and very cane.  Patient demonstrated increased difficulty ambulating with cane in either upper extremity.  When ambulating with Rollator patient demonstrated improved ambulation speed and decreased subjective complaints of fatigue with prolonged ambulation.  Patient will be provided with order from physical therapy and Dr. Raelene Bott she will be able to obtain an assistive device to improve her balance and safety with prolonged ambulation as well as to improve her ability to ambulate distances in the community safely with decreased risk of falls.  Patient demonstrated good ability to perform tasks on balance beam Airex today.  Patient indicated it was good practiced stepping on and off of curbs which she currently is not confident with.  Patient will continue to benefit from skilled physical therapy intervention in order to improve balance and endurance and overall quality of life.    Personal Factors and Comorbidities Age;Comorbidity 1    Examination-Activity Limitations Bend;Squat;Stand;Stairs    Examination-Participation Restrictions Community Activity    Stability/Clinical Decision Making Evolving/Moderate complexity    Rehab Potential Good    Clinical Impairments Affecting Rehab Potential motivated, good results from previous therapy    PT Frequency 2x / week    PT Duration 12 weeks    PT Treatment/Interventions ADLs/Self Care Home Management;DME Instruction;Gait training;Stair training;Functional mobility training;Therapeutic activities;Therapeutic exercise;Balance training;Neuromuscular re-education;Passive range of motion;Energy conservation    PT Next Visit Plan Develop HEP next session    PT Home Exercise Plan HEP handout provided 11/30/20. Access Code: [email or web address]  URL: [email or web address]    Consulted  and Agree with Plan of Care Patient             Patient will benefit from skilled therapeutic intervention in order to improve the following  deficits and impairments:  Abnormal gait, Decreased balance, Decreased endurance, Decreased mobility, Decreased activity tolerance  Visit Diagnosis: Unsteadiness on feet  Difficulty in walking, not elsewhere classified     Problem List Patient Active Problem List   Diagnosis Date Noted   Hypogammaglobulinemia ([place]) 11/16/2020   Imbalance 11/13/2020   Renal insufficiency 09/13/2020   Right shoulder pain 09/12/2020   Vitamin B12 deficiency 09/06/2020   MDD (major depressive disorder), recurrent, in partial remission ([place]) 02/23/2019   Urinary incontinence 03/15/2018   Falls 03/15/2018   History of uterine cancer 01/22/2018   Prediabetes 10/14/2017   Hypothyroidism 04/07/2015   Vitamin D deficiency 04/02/2015   Macrocytosis 03/28/2015   Other fatigue 11/25/2014   Advanced care planning/counseling discussion 03/15/2014   Health maintenance examination 03/15/2014   Postsurgical menopause 08/07/2011   Medicare annual wellness visit, subsequent 08/06/2011   Anemia 06/07/2009   Osteoarthritis 06/07/2009   Narcolepsy without cataplexy 05/31/2008   HLD (hyperlipidemia) 06/03/2007   MDD (major depressive disorder), recurrent episode, severe ([place]) 06/03/2007   GERD with stricture 06/03/2007    Particia Lather, PT 12/28/2020, 3:00 PM  [place] [address] [place], [place], [address] Phone: [phone]   Fax:  [phone]  Name: Faith Santiago MRN: [id number] Date of Birth: 08-Oct-1940

## 2021-01-02 ENCOUNTER — Other Ambulatory Visit: Payer: Self-pay

## 2021-01-02 ENCOUNTER — Ambulatory Visit: Payer: Medicare PPO | Admitting: Physical Therapy

## 2021-01-02 DIAGNOSIS — R262 Difficulty in walking, not elsewhere classified: Secondary | ICD-10-CM | POA: Diagnosis not present

## 2021-01-02 DIAGNOSIS — R2681 Unsteadiness on feet: Secondary | ICD-10-CM | POA: Diagnosis not present

## 2021-01-02 NOTE — Therapy (Signed)
[place] MAIN [facility] SERVICES [address] [place], [place], [address] Phone: [phone]   Fax:  [phone]  Physical Therapy Treatment  Patient Details  Name: Faith Santiago MRN: [id number] Date of Birth: 1941/02/21 Referring Provider (PT): [name]   Encounter Date: 01/02/2021   PT End of Session - 01/02/21 1419     Visit Number 12    Number of Visits 24    Date for PT Re-Evaluation 02/13/21    Authorization Type Medicare [employer]    Authorization Time Period 11/21/20-02/13/21    Progress Note Due on Visit 10    PT Start Time 1329    PT Stop Time 1414    PT Time Calculation (min) 45 min    Equipment Utilized During Treatment Gait belt    Activity Tolerance Patient tolerated treatment well;Patient limited by fatigue    Behavior During Therapy [facility] for tasks assessed/performed             Past Medical History:  Diagnosis Date   Anemia, unspecified    Anxiety    Arthritis    hands R>L, neck and lower back   Bimalleolar fracture of left ankle 11/19/2013   [place]   Cataract    COVID-19 05/2019   Depression    Esophageal reflux    GERD with stricture 06/03/2007   Glaucoma    History of osteopenia    DEXA WNL 2008, 2015   History of uterine cancer 1999   s/p hysterectomy   HLD (hyperlipidemia)    Narcolepsy    Thyroid disease    Uterine cancer ([place])     Past Surgical History:  Procedure Laterality Date   bilateral catarct removal  2010   COLONOSCOPY  05/2003   WNL (Dr Velora Heckler)   COLONOSCOPY  11/2017   3 small TA, diverticulisis, no f/u needed Carlean Purl)   DEXA  03/2014   WNL T score +3.9   ESOPHAGOGASTRODUODENOSCOPY  11/2017   dilated esoph stenosis, 8cm HH (Gessner)   ESOPHAGOGASTRODUODENOSCOPY  12/2017   repeat dilation of esoph stenosis, 5cm HH - rec stay on PPI Carlean Purl)   ESOPHAGOGASTRODUODENOSCOPY  12/2019   GERD with esophagitis and peptic stricture s/p dilation to 69mm, mod sized HH - rec increase PPI  to BID x 1 yr due to ongoing active inflammation on EGD Henrene Pastor)   ORIF ANKLE FRACTURE Left 11/19/2013   Procedure: LEFT ANKLE FRACTURE OPEN TREATMENT BIMALLEOLAR ANKLE INCLUDES INTERNAL FIXATION ;  Surgeon: Johnny Bridge, MD   RETINAL DETACHMENT REPAIR W/ SCLERAL BUCKLE LE Right 03/21/2016   TOTAL ABDOMINAL HYSTERECTOMY  1999   uterine cancer    There were no vitals filed for this visit.   Subjective Assessment - 01/02/21 1344     Subjective Pt reports no significant changes since previous session. Pt reports she will continue to go through her house this weekend in preperation for moving day.    Pertinent History Pt has referral scheduled with neuroloist about 8 weeks from initial evaluation on 8/9. Pt MD has been contacted via secure epic chat regarding her risk for polypharmacy. Pt has had multiple falls in the last 6 months, some resulting in minor head injuries and she reports many of them have occurreed when she is turning around.    Limitations Walking;Standing;House hold activities    How long can you sit comfortably? NA    How long can you stand comfortably? increased pain with prolonged standing >30 min    How  long can you walk comfortably? walking about 300-400 feet    Diagnostic tests none recent    Patient Stated Goals walk farther, reduce fall risk;                 Treatment provided this session  Therex:  Walking with rollator 5 min x 700 feet  -Patient demonstrated increased gait speed as well as increased endurance with ambulation today with use of Rollator compared to ambulation without assistive device.  STS 2 x 10 without UE use -pt reports last set as easier compared to prior to starting therapy.         Neuro Re- Ed:     The following activities were completed in parallel bars or at balance bar    Step up anteriorly and laterally laterally to airex pad x 10 on ea LE  -with no HH assist, CGA provided, Cues for minimal use of hands -1 LOB noted  and pt able to self recover with use of stepping strategy and UE   Forward reach while standing on airex pad  -utilized objets in front for cone stacking Anterior approximately  about 3 feet from airex pad  to wall    Rocker board 2 x 10 each direction  -utilization of B UE but cues to minimize UE grasp   Sidestepping on airex balance beam  -x 5 laps (4 setps each direction) with use of bilateral upper extremity -X5 laps with use of single upper extremity handhold assist  Anterior hurdle step over.  X10.  She is encouraged not to utilize hands for balance on anterior step when transitioning from anterior to posterior patient encouraged to utilize upper extremity to prevent loss of balance.  Patient has continued fear when stepping anteriorly without use of her hands and tends to have her hands of her bar due to fear of loss of balance.  Unless otherwise stated, CGA was provided and gait belt donned in order to ensure pt safety Pt required occasional rest breaks due fatigue, PT was quick to ask when pt appeared to be fatiguing in order to prevent excessive fatigue.   Unless otherwise stated, CGA was provided and gait belt donned in order to ensure pt safety   Note: Portions of this document were prepared using Dragon voice recognition software and although reviewed may contain unintentional dictation errors in syntax, grammar, or spelling.  Pt educated throughout session about proper posture and technique with exercises. Improved exercise technique, movement at target joints, use of target muscles after min to mod verbal, visual, tactile cues.   Pt required occasional rest breaks due fatigue, PT was quick to ask when pt appeared to be fatiguing in order to prevent excessive fatigue.                       PT Education - 01/02/21 1345     Education Details Pt educated regarding safe utilization of AD    Person(s) Educated Patient    Methods Explanation;Demonstration     Comprehension Verbalized understanding;Returned demonstration              PT Short Term Goals - 12/26/20 1343       PT SHORT TERM GOAL #1   Title Patient will be independent in home exercise program to improve strength/mobility for better functional independence with ADLs.    Baseline Pt is independent with hep    Time 4    Period Weeks    Status Achieved  PT SHORT TERM GOAL #2   Title Pt will improve FOTO socre to at least 58 in order to demonstrate statistically signicicant improvement in mobility and quality of life    Baseline 53 IE, 51 on 12/26/20    Time 4    Period Weeks    Status On-going    Target Date 01/16/21      PT SHORT TERM GOAL #3   Title Patient will deny any falls over past 4 weeks to demonstrate improved safety awareness at home and work.    Baseline Repots 1 fall about 4 weeks ago at church as previously documented    Time 4    Status On-going    Target Date 01/16/21               PT Long Term Goals - 12/26/20 1400       PT LONG TERM GOAL #1   Title Pt will improve self selected gait speed with LRAD to 1 m/s in order to indicate improved safety with community ambulation    Baseline .72    Time 12    Period Weeks    Status On-going      PT LONG TERM GOAL #2   Title Patient will increase six minute walk test distance to >1000 with LRAD for progression to community ambulator and improve gait ability    Baseline 800 feet witout AD on 9/19 at PN    Time 12    Period Weeks    Status On-going    Target Date 01/16/21      PT LONG TERM GOAL #3   Title Pt will improve FOTO score to 59 points or greater in order to to indicate statistically significant improvement in mobility and quality of life    Baseline 51 at PN on 09/13    Time 12    Period Weeks    Status Revised    Target Date 01/16/21                   Plan - 01/02/21 1420     Clinical Impression Statement Patient demonstrated good response to therapeutic exercise  and physical therapy interventions in today's session.  Patient demonstrated improved ambulatory capacity and endurance when ambulating with Rollator as compared to ambulating without an assistive device than previous sessions.  She continues to show improvement in lower extremity strength and power demonstrated by improved ability to perform sit to stands with multiple repetitions with less complaints of fatigue.  Patient continues to show signs of fatigue with prolonged standing and dynamic balance exercise as patient fatigues patient tends to have her lower extremities catch on objects such as when stepping onto things.  Patient does have good ability to respond to these losses of balance began utilizing her hands or stepping strategy.  Patient encouraged to monitor herself for signs of fatigue when she is ambulating in the community and to take frequent rest breaks if needed in order to prevent future falls.  Patient will continue to benefit from skilled physical therapy intervention in order to improve her quality of life and decrease her risk of falls improve her safety with household as well as community ambulation.    Personal Factors and Comorbidities Age;Comorbidity 1    Examination-Activity Limitations Bend;Squat;Stand;Stairs    Examination-Participation Restrictions Community Activity    Stability/Clinical Decision Making Evolving/Moderate complexity    Rehab Potential Good    Clinical Impairments Affecting Rehab Potential motivated, good results from previous therapy  PT Frequency 2x / week    PT Duration 12 weeks    PT Treatment/Interventions ADLs/Self Care Home Management;DME Instruction;Gait training;Stair training;Functional mobility training;Therapeutic activities;Therapeutic exercise;Balance training;Neuromuscular re-education;Passive range of motion;Energy conservation    PT Next Visit Plan Develop HEP next session    PT Home Exercise Plan HEP handout provided 11/30/20. Access Code:  [email or web address]  URL: [email or web address]    Consulted and Agree with Plan of Care Patient             Patient will benefit from skilled therapeutic intervention in order to improve the following deficits and impairments:  Abnormal gait, Decreased balance, Decreased endurance, Decreased mobility, Decreased activity tolerance  Visit Diagnosis: Unsteadiness on feet  Difficulty in walking, not elsewhere classified     Problem List Patient Active Problem List   Diagnosis Date Noted   Hypogammaglobulinemia ([place]) 11/16/2020   Imbalance 11/13/2020   Renal insufficiency 09/13/2020   Right shoulder pain 09/12/2020   Vitamin B12 deficiency 09/06/2020   MDD (major depressive disorder), recurrent, in partial remission ([place]) 02/23/2019   Urinary incontinence 03/15/2018   Falls 03/15/2018   History of uterine cancer 01/22/2018   Prediabetes 10/14/2017   Hypothyroidism 04/07/2015   Vitamin D deficiency 04/02/2015   Macrocytosis 03/28/2015   Other fatigue 11/25/2014   Advanced care planning/counseling discussion 03/15/2014   Health maintenance examination 03/15/2014   Postsurgical menopause 08/07/2011   Medicare annual wellness visit, subsequent 08/06/2011   Anemia 06/07/2009   Osteoarthritis 06/07/2009   Narcolepsy without cataplexy 05/31/2008   HLD (hyperlipidemia) 06/03/2007   MDD (major depressive disorder), recurrent episode, severe ([place]) 06/03/2007   GERD with stricture 06/03/2007    Particia Lather, PT 01/02/2021, 2:25 PM  [place] [address] [place], [place], [address] Phone: [phone]   Fax:  [phone]  Name: Faith Santiago MRN: [id number] Date of Birth: [birth year]

## 2021-01-04 ENCOUNTER — Ambulatory Visit: Payer: Medicare PPO | Admitting: Physical Therapy

## 2021-01-08 DIAGNOSIS — H5212 Myopia, left eye: Secondary | ICD-10-CM | POA: Diagnosis not present

## 2021-01-08 DIAGNOSIS — H34812 Central retinal vein occlusion, left eye, with macular edema: Secondary | ICD-10-CM | POA: Diagnosis not present

## 2021-01-08 DIAGNOSIS — H59811 Chorioretinal scars after surgery for detachment, right eye: Secondary | ICD-10-CM | POA: Diagnosis not present

## 2021-01-08 DIAGNOSIS — Z9842 Cataract extraction status, left eye: Secondary | ICD-10-CM | POA: Diagnosis not present

## 2021-01-08 DIAGNOSIS — Z9841 Cataract extraction status, right eye: Secondary | ICD-10-CM | POA: Diagnosis not present

## 2021-01-08 DIAGNOSIS — H5201 Hypermetropia, right eye: Secondary | ICD-10-CM | POA: Diagnosis not present

## 2021-01-09 ENCOUNTER — Other Ambulatory Visit: Payer: Self-pay

## 2021-01-09 ENCOUNTER — Ambulatory Visit: Payer: Medicare PPO | Admitting: Physical Therapy

## 2021-01-09 DIAGNOSIS — R2681 Unsteadiness on feet: Secondary | ICD-10-CM

## 2021-01-09 DIAGNOSIS — R262 Difficulty in walking, not elsewhere classified: Secondary | ICD-10-CM | POA: Diagnosis not present

## 2021-01-09 NOTE — Therapy (Signed)
[place] MAIN [facility] SERVICES [address] [place], [place], [address] Phone: [phone]   Fax:  [phone]  Physical Therapy Treatment  Patient Details  Name: Faith Santiago MRN: [id number] Date of Birth: 28-Jul-1940 Referring Provider (PT): [name]   Encounter Date: 01/09/2021   PT End of Session - 01/09/21 1317     Visit Number 13    Number of Visits 24    Date for PT Re-Evaluation 02/13/21    Authorization Type Medicare [employer]    Authorization Time Period 11/21/20-02/13/21    Progress Note Due on Visit 10    PT Start Time 1303    PT Stop Time 1344    PT Time Calculation (min) 41 min    Equipment Utilized During Treatment Gait belt;Other (comment)   rolator   Activity Tolerance Patient tolerated treatment well;Patient limited by fatigue    Behavior During Therapy [facility] for tasks assessed/performed             Past Medical History:  Diagnosis Date   Anemia, unspecified    Anxiety    Arthritis    hands R>L, neck and lower back   Bimalleolar fracture of left ankle 11/19/2013   [place]   Cataract    COVID-19 05/2019   Depression    Esophageal reflux    GERD with stricture 06/03/2007   Glaucoma    History of osteopenia    DEXA WNL 2008, 2015   History of uterine cancer 1999   s/p hysterectomy   HLD (hyperlipidemia)    Narcolepsy    Thyroid disease    Uterine cancer ([place])     Past Surgical History:  Procedure Laterality Date   bilateral catarct removal  2010   COLONOSCOPY  05/2003   WNL (Dr Velora Heckler)   COLONOSCOPY  11/2017   3 small TA, diverticulisis, no f/u needed Carlean Purl)   DEXA  03/2014   WNL T score +3.9   ESOPHAGOGASTRODUODENOSCOPY  11/2017   dilated esoph stenosis, 8cm HH (Gessner)   ESOPHAGOGASTRODUODENOSCOPY  12/2017   repeat dilation of esoph stenosis, 5cm HH - rec stay on PPI Carlean Purl)   ESOPHAGOGASTRODUODENOSCOPY  12/2019   GERD with esophagitis and peptic stricture s/p dilation to 42mm, mod  sized HH - rec increase PPI to BID x 1 yr due to ongoing active inflammation on EGD Henrene Pastor)   ORIF ANKLE FRACTURE Left 11/19/2013   Procedure: LEFT ANKLE FRACTURE OPEN TREATMENT BIMALLEOLAR ANKLE INCLUDES INTERNAL FIXATION ;  Surgeon: Johnny Bridge, MD   RETINAL DETACHMENT REPAIR W/ SCLERAL BUCKLE LE Right 03/21/2016   TOTAL ABDOMINAL HYSTERECTOMY  1999   uterine cancer    There were no vitals filed for this visit.   Subjective Assessment - 01/09/21 1305     Subjective Pt reports she went to eye doctor since her last visit and she reports she has some blood vessel problem. Pt reports it may have been caused by one of her falls.    Pertinent History Pt has referral scheduled with neuroloist about 8 weeks from initial evaluation on 8/9. Pt MD has been contacted via secure epic chat regarding her risk for polypharmacy. Pt has had multiple falls in the last 6 months, some resulting in minor head injuries and she reports many of them have occurreed when she is turning around.    Limitations Walking;Standing;House hold activities    How long can you sit comfortably? NA    How long can you stand comfortably? increased  pain with prolonged standing >30 min    How long can you walk comfortably? walking about 300-400 feet    Diagnostic tests none recent    Patient Stated Goals walk farther, reduce fall risk;     Currently in Pain? No/denies             Treatment provided this session    Neuro Re- Ed:   Heel and toe rocks 3 x 10  -cues to prevent utilization of hip strategy to prevent LOB posterior -hesitancey with post weight shifting    Lateral step up  to airex 2 x 10 ( 5 ea side) ,  -1st set B HH., second set no HH A  anterior step up to airex pad  2 x 10 (5 ea side )  - No HH A, no LOB 1st set , rest following first set   Rocker board  X 10 ant and post weight shifting with tactile cues to utilize ankle strategy 2*1 min standing on rocker board preventing anterior to posterior  weight shifting. -Cues to utilize ankle strategies via tactile cues at shoulder and hips to keep trunk vertical  Forward reach on Airex pad.  10 times forward with H-shaped upper extremity to limits of stability     There Act:  Ambulation with Rolator training 4 min x 1 at .71 m/s -Patient continues to demonstrate improved gait pattern and 1 normal base of support when ambulating with Rollator as compared to ambulation without Rollator.  STS 2 x 10  -no UE assist,  -forward lean to shift weight, some difficulty with post weight shift when coming to standing position   Unless otherwise stated, CGA was provided and gait belt donned in order to ensure pt safety Pt required occasional rest breaks due fatigue, PT was quick to ask when pt appeared to be fatiguing in order to prevent excessive fatigue. Pt educated throughout session about proper posture and technique with exercises. Improved exercise technique, movement at target joints, use of target muscles after min to mod verbal, visual, tactile cues.          Note: Portions of this document were prepared using Dragon voice recognition software and although reviewed may contain unintentional dictation errors in syntax, grammar, or spelling.                   PT Education - 01/09/21 1309     Education Details Pt educated regarding ordering an AD.    Person(s) Educated Patient    Methods Explanation;Demonstration    Comprehension Verbalized understanding;Returned demonstration              PT Short Term Goals - 12/26/20 1343       PT SHORT TERM GOAL #1   Title Patient will be independent in home exercise program to improve strength/mobility for better functional independence with ADLs.    Baseline Pt is independent with hep    Time 4    Period Weeks    Status Achieved      PT SHORT TERM GOAL #2   Title Pt will improve FOTO socre to at least 58 in order to demonstrate statistically signicicant  improvement in mobility and quality of life    Baseline 53 IE, 51 on 12/26/20    Time 4    Period Weeks    Status On-going    Target Date 01/16/21      PT SHORT TERM GOAL #3   Title Patient will deny any falls  over past 4 weeks to demonstrate improved safety awareness at home and work.    Baseline Repots 1 fall about 4 weeks ago at church as previously documented    Time 4    Status On-going    Target Date 01/16/21               PT Long Term Goals - 12/26/20 1400       PT LONG TERM GOAL #1   Title Pt will improve self selected gait speed with LRAD to 1 m/s in order to indicate improved safety with community ambulation    Baseline .72    Time 12    Period Weeks    Status On-going      PT LONG TERM GOAL #2   Title Patient will increase six minute walk test distance to >1000 with LRAD for progression to community ambulator and improve gait ability    Baseline 800 feet witout AD on 9/19 at PN    Time 12    Period Weeks    Status On-going    Target Date 01/16/21      PT LONG TERM GOAL #3   Title Pt will improve FOTO score to 59 points or greater in order to to indicate statistically significant improvement in mobility and quality of life    Baseline 51 at PN on 09/13    Time 12    Period Weeks    Status Revised    Target Date 01/16/21                   Plan - 01/09/21 1358     Clinical Impression Statement Patient demonstrated good motivation to complete activities and physical therapy session patient remains motivated to improve her balance and decrease her fall frequency and risk.  Patient continues to display improved ambulatory endurance and speed when ambulating with a Rollator patient was provided with signed doctors orders to receive Rollator device.  Patient continues to show fatigue with increased repetitions utilizing large lower extremity muscles or standing for prolonged periods further indicating Rollator with seat very beneficial to improve her  community ambulation safety.  Patient demonstrated improved efficacy with anticipatory balance responses such as stepping onto Airex forward and laterally as well as shifting weight anteriorly and posteriorly.  Patient does have difficulty utilizing ankle strategies with posterior weight shift tends to utilize hip strategies prior to utilizing ankle strategies.  With tactile cues this improved but still needs to be worked on further.  Patient will continue to benefit from skilled physical therapy which improve her safety with household and community ambulation.    Personal Factors and Comorbidities Age;Comorbidity 1    Examination-Activity Limitations Bend;Squat;Stand;Stairs    Examination-Participation Restrictions Community Activity    Stability/Clinical Decision Making Evolving/Moderate complexity    Rehab Potential Good    Clinical Impairments Affecting Rehab Potential motivated, good results from previous therapy    PT Frequency 2x / week    PT Duration 12 weeks    PT Treatment/Interventions ADLs/Self Care Home Management;DME Instruction;Gait training;Stair training;Functional mobility training;Therapeutic activities;Therapeutic exercise;Balance training;Neuromuscular re-education;Passive range of motion;Energy conservation    PT Next Visit Plan Develop HEP next session    PT Home Exercise Plan HEP handout provided 11/30/20. Access Code: [email or web address]  URL: [email or web address]    Consulted and Agree with Plan of Care Patient             Patient will benefit from skilled therapeutic intervention in order to improve the following deficits  and impairments:  Abnormal gait, Decreased balance, Decreased endurance, Decreased mobility, Decreased activity tolerance  Visit Diagnosis: Unsteadiness on feet  Difficulty in walking, not elsewhere classified     Problem List Patient Active Problem List   Diagnosis Date Noted   Hypogammaglobulinemia ([place]) 11/16/2020   Imbalance  11/13/2020   Renal insufficiency 09/13/2020   Right shoulder pain 09/12/2020   Vitamin B12 deficiency 09/06/2020   MDD (major depressive disorder), recurrent, in partial remission ([place]) 02/23/2019   Urinary incontinence 03/15/2018   Falls 03/15/2018   History of uterine cancer 01/22/2018   Prediabetes 10/14/2017   Hypothyroidism 04/07/2015   Vitamin D deficiency 04/02/2015   Macrocytosis 03/28/2015   Other fatigue 11/25/2014   Advanced care planning/counseling discussion 03/15/2014   Health maintenance examination 03/15/2014   Postsurgical menopause 08/07/2011   Medicare annual wellness visit, subsequent 08/06/2011   Anemia 06/07/2009   Osteoarthritis 06/07/2009   Narcolepsy without cataplexy 05/31/2008   HLD (hyperlipidemia) 06/03/2007   MDD (major depressive disorder), recurrent episode, severe ([place]) 06/03/2007   GERD with stricture 06/03/2007    Particia Lather, PT 01/09/2021, 2:01 PM  [place] [address] [place], [place], [address] Phone: [phone]   Fax:  [phone]  Name: Faith Santiago MRN: [id number] Date of Birth: 12/26/1940

## 2021-01-11 ENCOUNTER — Ambulatory Visit: Payer: Medicare PPO | Admitting: Physical Therapy

## 2021-01-11 ENCOUNTER — Other Ambulatory Visit: Payer: Self-pay

## 2021-01-11 DIAGNOSIS — R262 Difficulty in walking, not elsewhere classified: Secondary | ICD-10-CM | POA: Diagnosis not present

## 2021-01-11 DIAGNOSIS — R2681 Unsteadiness on feet: Secondary | ICD-10-CM | POA: Diagnosis not present

## 2021-01-11 NOTE — Patient Instructions (Signed)
Access Code: [email or web address] URL: [email or web address] Date: 01/11/2021 Prepared by: Rivka Barbara   Exercises Sit to Stand Without Arm Support - 1 x daily - 7 x weekly - 2 sets - 10 reps Side Stepping with Counter Support - 1 x daily - 7 x weekly - 2 sets - 10 reps Standing March with Counter Support - 1 x daily - 7 x weekly - 2 sets - 10 reps Heel Toe Raises with Counter Support - 1 x daily - 7 x weekly - 2 sets - 10 reps

## 2021-01-11 NOTE — Therapy (Signed)
[place] MAIN [facility] SERVICES [address] [place], [place], [address] Phone: [phone]   Fax:  [phone]  Physical Therapy Treatment/ Discharge summary   Patient Details  Name: Faith Santiago MRN: [id number] Date of Birth: 03-05-1941 Referring Provider (PT): [name]   Encounter Date: 01/11/2021   PT End of Session - 01/11/21 1326     Visit Number 14    Number of Visits 24    Date for PT Re-Evaluation 02/13/21    Authorization Type Medicare [employer]    Authorization Time Period 11/21/20-02/13/21    Progress Note Due on Visit 10    PT Start Time 1319    PT Stop Time 1355    PT Time Calculation (min) 36 min    Equipment Utilized During Treatment Gait belt;Other (comment)   rolator   Activity Tolerance Patient tolerated treatment well;Patient limited by fatigue    Behavior During Therapy [facility] for tasks assessed/performed             Past Medical History:  Diagnosis Date   Anemia, unspecified    Anxiety    Arthritis    hands R>L, neck and lower back   Bimalleolar fracture of left ankle 11/19/2013   [place]   Cataract    COVID-19 05/2019   Depression    Esophageal reflux    GERD with stricture 06/03/2007   Glaucoma    History of osteopenia    DEXA WNL 2008, 2015   History of uterine cancer 1999   s/p hysterectomy   HLD (hyperlipidemia)    Narcolepsy    Thyroid disease    Uterine cancer ([place])     Past Surgical History:  Procedure Laterality Date   bilateral catarct removal  2010   COLONOSCOPY  05/2003   WNL (Dr Velora Heckler)   COLONOSCOPY  11/2017   3 small TA, diverticulisis, no f/u needed Carlean Purl)   DEXA  03/2014   WNL T score +3.9   ESOPHAGOGASTRODUODENOSCOPY  11/2017   dilated esoph stenosis, 8cm HH (Gessner)   ESOPHAGOGASTRODUODENOSCOPY  12/2017   repeat dilation of esoph stenosis, 5cm HH - rec stay on PPI Carlean Purl)   ESOPHAGOGASTRODUODENOSCOPY  12/2019   GERD with esophagitis and peptic stricture s/p  dilation to 30m, mod sized HH - rec increase PPI to BID x 1 yr due to ongoing active inflammation on EGD (Henrene Pastor   ORIF ANKLE FRACTURE Left 11/19/2013   Procedure: LEFT ANKLE FRACTURE OPEN TREATMENT BIMALLEOLAR ANKLE INCLUDES INTERNAL FIXATION ;  Surgeon: JJohnny Bridge MD   RETINAL DETACHMENT REPAIR W/ SCLERAL BUCKLE LE Right 03/21/2016   TOTAL ABDOMINAL HYSTERECTOMY  1999   uterine cancer    There were no vitals filed for this visit.   Subjective Assessment - 01/11/21 1323     Subjective Pt reports no significant changes since last session. Reports some family members have homes effected by [place]    Pertinent History Pt has referral scheduled with neuroloist about 8 weeks from initial evaluation on 8/9. Pt MD has been contacted via secure epic chat regarding her risk for polypharmacy. Pt has had multiple falls in the last 6 months, some resulting in minor head injuries and she reports many of them have occurreed when she is turning around.    Limitations Walking;Standing;House hold activities    How long can you sit comfortably? NA    How long can you stand comfortably? increased pain with prolonged standing >30 min    How long  can you walk comfortably? walking about 300-400 feet    Diagnostic tests none recent    Patient Stated Goals walk farther, reduce fall risk;                    Treatment provided this session  Therex:   Updated HEP below was thoroughly reviewed as this is pt's last scheduled appointment.  Completed all exercises as below as part of home exercise program in order to ensure patient perform correctly understood appropriate progressions in order to safely progress exercises but also to remain challenging and beneficial for her in the future.  Exercises Sit to Stand Without Arm Support - 1 x daily - 7 x weekly - 2 sets - 10 reps Side Stepping with Counter Support - 1 x daily - 7 x weekly - 2 sets - 10 reps -Instructed if this becomes easy she  can perform with hand elevated over countertop in order to improve single-leg balance, however, patient instructed only to do this when she becomes increasingly confident with activity Standing March with Counter Support - 1 x daily - 7 x weekly - 2 sets - 10 reps -Instructed if this becomes easy she can perform with hand elevated over countertop in order to improve single-leg balance, however, patient instructed only to do this when she becomes increasingly confident with activity Heel Toe Raises with Counter Support - 1 x daily - 7 x weekly - 2 sets - 10 reps --Instructed if this becomes easy she can perform with hand elevated over countertop in order to improve single-leg balance, however, patient instructed only to do this when she becomes increasingly confident with activity  6-minute walk test administered with Rollator today patient ambulated 825 feet and demonstrated fewer signs of fatigue compared with ambulating without Rollator for a similar time  During last progress note several weeks ago.   Pt session was cut a little short today due to pt arriving a few minutes late to scheduled appointment time Pt required occasional rest breaks due fatigue, PT was quick to ask when pt appeared to be fatiguing in order to prevent excessive fatigue. Patient educated and proper strategies to perform sit to stand from Rollator in the case that she does purchase a Rollator in the future.  Patient demonstrates good ability and understanding of importance of utilizing brakes on Rollator positioning from sit to stand.  Pt educated throughout session about proper posture and technique with exercises. Improved exercise technique, movement at target joints, use of target muscles after min to mod verbal, visual, tactile cues.                      PT Education - 01/11/21 1710     Education Details Updated HEP provided and reviewed    Person(s) Educated Patient    Methods  Explanation;Demonstration;Tactile cues;Handout    Comprehension Verbalized understanding;Returned demonstration;Verbal cues required              PT Short Term Goals - 01/11/21 1715       PT SHORT TERM GOAL #1   Title Patient will be independent in home exercise program to improve strength/mobility for better functional independence with ADLs.    Baseline Pt is independent with hep    Time 4    Period Weeks    Status Achieved      PT SHORT TERM GOAL #2   Title Pt will improve FOTO socre to at least 58 in order to  demonstrate statistically signicicant improvement in mobility and quality of life    Baseline 53 IE, 51 on 12/26/20    Time 4    Period Weeks    Status Not Met    Target Date 01/16/21      PT SHORT TERM GOAL #3   Title Patient will deny any falls over past 4 weeks to demonstrate improved safety awareness at home and work.    Baseline Repots 1 fall about 4 weeks ago at church as previously documented    Time 4    Status Achieved    Target Date 01/16/21               PT Long Term Goals - 12/26/20 1400       PT LONG TERM GOAL #1   Title Pt will improve self selected gait speed with LRAD to 1 m/s in order to indicate improved safety with community ambulation    Baseline .72    Time 12    Period Weeks    Status On-going      PT LONG TERM GOAL #2   Title Patient will increase six minute walk test distance to >1000 with LRAD for progression to community ambulator and improve gait ability    Baseline 800 feet witout AD on 9/19 at [facility]    Time 12    Period Weeks    Status On-going    Target Date 01/16/21      PT LONG TERM GOAL #3   Title Pt will improve FOTO score to 59 points or greater in order to to indicate statistically significant improvement in mobility and quality of life    Baseline 51 at PN on 09/13    Time 12    Period Weeks    Status Revised    Target Date 01/16/21                   Plan - 01/11/21 1711     Clinical Impression  Statement Patient presents today for final physical therapy session.  Patient could continue to benefit from physical therapy services patient is moving to a new home with her husband and will be located out of town.  Patient is encouraged to continue to pursue physical therapy services following her move and is agreeable for physical therapy assessment after she gets settled in.  Patient continues to demonstrate fatigue with prolonged ambulation but did demonstrate improved ambulation distance with Rollator and decreased signs of fatigue compared with a few sessions ago when completing without assistive device.  Patient was provided with order for assistive device signed by Dr. And is encouraged to pursue and obtain a Rollator in order to provide her somewhere to sit with prolonged ambulation to prevent future falls.  Patient will be discharged from physical therapy services from [facility] at this time.  Patient was provided with advanced home exercise program as well as it ways to advance to home exercise program if it ever becomes too easy.  All questions were answered post session and patient was pleased with physical therapy services provided.    Personal Factors and Comorbidities Age;Comorbidity 1    Examination-Activity Limitations Bend;Squat;Stand;Stairs    Examination-Participation Restrictions Community Activity    Stability/Clinical Decision Making Evolving/Moderate complexity    Rehab Potential Good    Clinical Impairments Affecting Rehab Potential motivated, good results from previous therapy    PT Frequency 2x / week    PT Duration 12 weeks    PT  Treatment/Interventions ADLs/Self Care Home Management;DME Instruction;Gait training;Stair training;Functional mobility training;Therapeutic activities;Therapeutic exercise;Balance training;Neuromuscular re-education;Passive range of motion;Energy conservation    PT Next Visit Plan Develop HEP next session    PT Home Exercise Plan HEP handout  provided 11/30/20. Access Code: [email or web address]  URL: [email or web address]    Consulted and Agree with Plan of Care Patient             Patient will benefit from skilled therapeutic intervention in order to improve the following deficits and impairments:  Abnormal gait, Decreased balance, Decreased endurance, Decreased mobility, Decreased activity tolerance  Visit Diagnosis: Unsteadiness on feet  Difficulty in walking, not elsewhere classified     Problem List Patient Active Problem List   Diagnosis Date Noted   Hypogammaglobulinemia ([place]) 11/16/2020   Imbalance 11/13/2020   Renal insufficiency 09/13/2020   Right shoulder pain 09/12/2020   Vitamin B12 deficiency 09/06/2020   MDD (major depressive disorder), recurrent, in partial remission ([place]) 02/23/2019   Urinary incontinence 03/15/2018   Falls 03/15/2018   History of uterine cancer 01/22/2018   Prediabetes 10/14/2017   Hypothyroidism 04/07/2015   Vitamin D deficiency 04/02/2015   Macrocytosis 03/28/2015   Other fatigue 11/25/2014   Advanced care planning/counseling discussion 03/15/2014   Health maintenance examination 03/15/2014   Postsurgical menopause 08/07/2011   Medicare annual wellness visit, subsequent 08/06/2011   Anemia 06/07/2009   Osteoarthritis 06/07/2009   Narcolepsy without cataplexy 05/31/2008   HLD (hyperlipidemia) 06/03/2007   MDD (major depressive disorder), recurrent episode, severe ([place]) 06/03/2007   GERD with stricture 06/03/2007    Particia Lather, PT 01/11/2021, 5:17 PM  [place] MAIN [place] SERVICES [place], [place], [address] Phone: [phone]   Fax:  [phone]  Name: Faith Santiago MRN: [id number] Date of Birth: 15-Sep-1940

## 2021-01-18 DIAGNOSIS — H3582 Retinal ischemia: Secondary | ICD-10-CM | POA: Diagnosis not present

## 2021-01-18 DIAGNOSIS — H34812 Central retinal vein occlusion, left eye, with macular edema: Secondary | ICD-10-CM | POA: Diagnosis not present

## 2021-01-18 DIAGNOSIS — H3562 Retinal hemorrhage, left eye: Secondary | ICD-10-CM | POA: Diagnosis not present

## 2021-01-18 DIAGNOSIS — H43812 Vitreous degeneration, left eye: Secondary | ICD-10-CM | POA: Diagnosis not present

## 2021-01-24 DIAGNOSIS — H34812 Central retinal vein occlusion, left eye, with macular edema: Secondary | ICD-10-CM | POA: Diagnosis not present

## 2021-02-06 ENCOUNTER — Ambulatory Visit: Payer: Medicare PPO | Admitting: Neurology

## 2021-02-06 ENCOUNTER — Encounter: Payer: Self-pay | Admitting: Neurology

## 2021-02-06 VITALS — BP 125/74 | HR 99 | Ht 64.0 in | Wt 199.0 lb

## 2021-02-06 DIAGNOSIS — M545 Low back pain, unspecified: Secondary | ICD-10-CM

## 2021-02-06 DIAGNOSIS — Z9181 History of falling: Secondary | ICD-10-CM | POA: Insufficient documentation

## 2021-02-06 DIAGNOSIS — R531 Weakness: Secondary | ICD-10-CM

## 2021-02-06 DIAGNOSIS — R269 Unspecified abnormalities of gait and mobility: Secondary | ICD-10-CM

## 2021-02-06 DIAGNOSIS — G8929 Other chronic pain: Secondary | ICD-10-CM

## 2021-02-06 DIAGNOSIS — R159 Full incontinence of feces: Secondary | ICD-10-CM | POA: Diagnosis not present

## 2021-02-06 NOTE — Progress Notes (Signed)
Chief Complaint  Patient presents with   New Patient (Initial Visit)    New rm , with husband, here for balance issues and multiple falls, states last fall was 3 months ago       [place]  Faith Santiago is a 80 y.o. female  Gradual onset onset gait abnormalities, frequent falls, Intermittent upper extremity paresthesia, worsening low back pain  On examination, she has mild proximal muscle weakness, hyperreflexia, stiff gait, with small stride,  Differentiation diagnosis include cervical spondylitic myelopathy, need to rule out superimposed lumbar radiculopathy  CPK to rule out intrinsic muscle disease  Sudden onset of vertigo, fell to the floor  Multiple vascular risk factor, aging, obesity, hyperlipidemia  Possible posterior circulation insufficiency MRI of the brain Emphasized importance of moderate exercise, physical therapy at local facility,    DIAGNOSTIC DATA (LABS, IMAGING, TESTING) - I reviewed patient records, labs, notes, testing and imaging myself where available.   MEDICAL HISTORY:  Faith Santiago is a 80 year old female, seen in request by her primary care physician Dr. [name], evaluation of frequent falling, she is accompanied by her husband at today's visit on February 06, 2021.    I reviewed and summarized the referring note. PMHX. Depression, anxiety Hypothyroidism HLD Uterine cancer, Left ankle fracture, s/p surgery Uterine cancer s/p hysterectomy in 1999, radiation Right eye retinal detachment   She noticed gradual onset of gait abnormality since 2019, gradually getting worse, fell multiple times, she described bilateral lower extremity weakness, leg gave out underneath her, no sensory loss, but over the past couple years, she has developed low back pain, especially with prolonged walking, intermittent upper extremity numbness, subjective upper and lower extremity weakness, difficulty holding arm overhead for prolonged period of  time, denies persistent sensory loss of upper or lower extremity, in addition, she complains of intermittent urinary and bowel incontinence, especially since past 6 months,  She just moved to the independent living in [place] last week, still want to keep our appointment, and work-up, to expedite her evaluation  She also reported 1 episode of sudden onset dizziness few months ago, she was standing at the counter, suddenly had vertigo, felt like she is going to pass out, she melted to the floor, with generalized arm and leg weakness, her limbs could no longer support her body weight, there was no loss of consciousness, then needing help to get up from the floor.   PHYSICAL EXAM:   Vitals:   02/06/21 1346  BP: 125/74  Pulse: 99  Weight: 199 lb (90.3 kg)  Height: 5\' 4"  (1.626 m)   Not recorded     Body mass index is 34.16 kg/m.  PHYSICAL EXAMNIATION:  Gen: NAD, conversant, well nourised, well groomed                     Cardiovascular: Regular rate rhythm, no peripheral edema, warm, nontender. Eyes: Conjunctivae clear without exudates or hemorrhage Neck: Supple, no carotid bruits. Pulmonary: Clear to auscultation bilaterally   NEUROLOGICAL EXAM:  MENTAL STATUS: Speech:    Speech is normal; fluent and spontaneous with normal comprehension.  Cognition:     Orientation to time, place and person     Normal recent and remote memory     Normal Attention span and concentration     Normal Language, naming, repeating,spontaneous speech     Fund of knowledge   CRANIAL NERVES: CN II: Visual fields are full to confrontation. Pupils are round equal and briskly  reactive to light. CN III, IV, VI: extraocular movement are normal. No ptosis. CN V: Facial sensation is intact to light touch CN VII: Face is symmetric with normal eye closure  CN VIII: Hearing is normal to causal conversation. CN IX, X: Phonation is normal. CN XI: Head turning and shoulder shrug are intact  MOTOR: Patient is  very deconditioned, mild neck flexion, upper extremity proximal muscle weakness, mild to moderate bilateral hip flexion weakness  REFLEXES: Reflexes are 3 and symmetric at the biceps, triceps, knees, and ankles. Plantar responses are tensor bilaterally  SENSORY: Intact to light touch, pinprick and vibratory sensation are intact in fingers and toes.  COORDINATION: There is no trunk or limb dysmetria noted.  GAIT/STANCE: She can get up from seated position arm crossed by 2 attempts, stiff, small stride, mildly unsteady gait, able to stand up on tiptoe, more difficulty with hills  REVIEW OF SYSTEMS:  Full 14 system review of systems performed and notable only for as above All other review of systems were negative.   ALLERGIES: Allergies  Allergen Reactions   Ciprofloxacin     REACTION: sun induced rash    HOME MEDICATIONS: Current Outpatient Medications  Medication Sig Dispense Refill   ALPRAZolam (XANAX) 0.25 MG tablet Take 1 tablet (0.25 mg total) by mouth at bedtime as needed for anxiety. 30 tablet 2   ARIPiprazole (ABILIFY) 5 MG tablet Take 1 tablet (5 mg total) by mouth daily. 90 tablet 0   atorvastatin (LIPITOR) 20 MG tablet Take 1 tablet (20 mg total) by mouth daily. 90 tablet 3   buPROPion (WELLBUTRIN XL) 150 MG 24 hr tablet Take 1 tablet (150 mg total) by mouth in the morning. 90 tablet 0   Cholecalciferol (VITAMIN D) 50 MCG (2000 UT) CAPS Take 1 capsule (2,000 Units total) by mouth daily. 30 capsule    Cyanocobalamin (B-12) 1000 MCG SUBL Place 1 tablet under the tongue daily.     levothyroxine (SYNTHROID) 50 MCG tablet Take 1 tablet (50 mcg total) by mouth daily. 90 tablet 3   omeprazole (PRILOSEC) 40 MG capsule Take 1 capsule (40 mg total) by mouth 2 (two) times daily before a meal. 180 capsule 3   venlafaxine XR (EFFEXOR XR) 75 MG 24 hr capsule Take 1 capsule with 150 mg capsule daily 90 capsule 0   venlafaxine XR (EFFEXOR-XR) 150 MG 24 hr capsule Take 1 capsule with 75  mg capsule daily (total dose 225 mg daily) 90 capsule 0   No current facility-administered medications for this visit.    PAST MEDICAL HISTORY: Past Medical History:  Diagnosis Date   Anemia, unspecified    Anxiety    Arthritis    hands R>L, neck and lower back   Bimalleolar fracture of left ankle 11/19/2013   [place]   Cataract    COVID-19 05/2019   Depression    Esophageal reflux    GERD with stricture 06/03/2007   Glaucoma    History of osteopenia    DEXA WNL 2008, 2015   History of uterine cancer 1999   s/p hysterectomy   HLD (hyperlipidemia)    Narcolepsy    Thyroid disease    Uterine cancer ([place])     PAST SURGICAL HISTORY: Past Surgical History:  Procedure Laterality Date   bilateral catarct removal  2010   COLONOSCOPY  05/2003   WNL (Dr Velora Heckler)   COLONOSCOPY  11/2017   3 small TA, diverticulisis, no f/u needed Carlean Purl)   DEXA  03/2014  WNL T score +3.9   ESOPHAGOGASTRODUODENOSCOPY  11/2017   dilated esoph stenosis, 8cm HH ([facility]ner)   ESOPHAGOGASTRODUODENOSCOPY  12/2017   repeat dilation of esoph stenosis, 5cm HH - rec stay on PPI Carlean Purl)   ESOPHAGOGASTRODUODENOSCOPY  12/2019   GERD with esophagitis and peptic stricture s/p dilation to 32mm, mod sized HH - rec increase PPI to BID x 1 yr due to ongoing active inflammation on EGD Henrene Pastor)   ORIF ANKLE FRACTURE Left 11/19/2013   Procedure: LEFT ANKLE FRACTURE OPEN TREATMENT BIMALLEOLAR ANKLE INCLUDES INTERNAL FIXATION ;  Surgeon: Johnny Bridge, MD   RETINAL DETACHMENT REPAIR W/ SCLERAL BUCKLE LE Right 03/21/2016   TOTAL ABDOMINAL HYSTERECTOMY  1999   uterine cancer    FAMILY HISTORY: Family History  Problem Relation Age of Onset   Hypothyroidism Mother    Stroke Mother        ministroke   Coronary artery disease Mother 65       s/p some heart surgery   Prostate cancer Father    Diabetes Neg Hx    Colon cancer Neg Hx    Esophageal cancer Neg Hx    Pancreatic cancer Neg Hx    Stomach cancer Neg Hx     Liver disease Neg Hx    Rectal cancer Neg Hx     SOCIAL HISTORY: Social History   Socioeconomic History   Marital status: Married    Spouse name: Not on file   Number of children: 2   Years of education: Not on file   Highest education level: Not on file  Occupational History   Occupation: retired-occupational therapist  Tobacco Use   Smoking status: Never   Smokeless tobacco: Never  Vaping Use   Vaping Use: Never used  Substance and Sexual Activity   Alcohol use: Yes    Alcohol/week: 7.0 - 12.0 standard drinks    Types: 7 - 10 Glasses of wine per week    Comment: moderate   Drug use: No   Sexual activity: Not Currently  Other Topics Concern   Not on file  Social History Narrative   caffeine: none   Lives with husband, has 2 grown children, 1 cat   Occupation: retired, was OT for [employer]   Activity: no regular exercise, to start silver sneakers   Diet: overeating, good amt water, vegetables daily, occasional fruits   Never smoker   EtOH 1/day   Social Determinants of Health   Financial Resource Strain: Low Risk    Difficulty of Paying Living Expenses: Not hard at all  Food Insecurity: No Food Insecurity   Worried About Charity fundraiser in the Last Year: Never true   [place] in the Last Year: Never true  Transportation Needs: No Transportation Needs   Lack of Transportation (Medical): No   Lack of Transportation (Non-Medical): No  Physical Activity: Inactive   Days of Exercise per Week: 0 days   Minutes of Exercise per Session: 0 min  Stress: Stress Concern Present   Feeling of Stress : To some extent  Social Connections: Not on file  Intimate Partner Violence: Not At Risk   Fear of Current or Ex-Partner: No   Emotionally Abused: No   Physically Abused: No   Sexually Abused: No      Marcial Pacas, M.D. Ph.D.  [facility] [address] [place], [place] [address] Ph: [phone] Fax: [phone]  CC:  [name], MD [place]  [place],  [place] [address]  [name], MD

## 2021-02-08 ENCOUNTER — Encounter: Payer: Self-pay | Admitting: Neurology

## 2021-02-08 LAB — CBC WITH DIFFERENTIAL/PLATELET
Basophils Absolute: 0.1 10*3/uL (ref 0.0–0.2)
Basos: 1 %
EOS (ABSOLUTE): 0.2 10*3/uL (ref 0.0–0.4)
Eos: 3 %
Hematocrit: 35.4 % (ref 34.0–46.6)
Hemoglobin: 11.9 g/dL (ref 11.1–15.9)
Immature Grans (Abs): 0 10*3/uL (ref 0.0–0.1)
Immature Granulocytes: 0 %
Lymphocytes Absolute: 1.6 10*3/uL (ref 0.7–3.1)
Lymphs: 25 %
MCH: 33.1 pg — ABNORMAL HIGH (ref 26.6–33.0)
MCHC: 33.6 g/dL (ref 31.5–35.7)
MCV: 99 fL — ABNORMAL HIGH (ref 79–97)
Monocytes Absolute: 0.6 10*3/uL (ref 0.1–0.9)
Monocytes: 10 %
Neutrophils Absolute: 3.9 10*3/uL (ref 1.4–7.0)
Neutrophils: 61 %
Platelets: 254 10*3/uL (ref 150–450)
RBC: 3.59 x10E6/uL — ABNORMAL LOW (ref 3.77–5.28)
RDW: 12.9 % (ref 11.7–15.4)
WBC: 6.3 10*3/uL (ref 3.4–10.8)

## 2021-02-08 LAB — COMPREHENSIVE METABOLIC PANEL
ALT: 13 IU/L (ref 0–32)
AST: 24 IU/L (ref 0–40)
Albumin/Globulin Ratio: 2.3 — ABNORMAL HIGH (ref 1.2–2.2)
Albumin: 4.6 g/dL (ref 3.7–4.7)
Alkaline Phosphatase: 96 IU/L (ref 44–121)
BUN/Creatinine Ratio: 19 (ref 12–28)
BUN: 23 mg/dL (ref 8–27)
Bilirubin Total: 0.2 mg/dL (ref 0.0–1.2)
CO2: 29 mmol/L (ref 20–29)
Calcium: 9.5 mg/dL (ref 8.7–10.3)
Chloride: 107 mmol/L — ABNORMAL HIGH (ref 96–106)
Creatinine, Ser: 1.23 mg/dL — ABNORMAL HIGH (ref 0.57–1.00)
Globulin, Total: 2 g/dL (ref 1.5–4.5)
Glucose: 110 mg/dL — ABNORMAL HIGH (ref 70–99)
Potassium: 4.8 mmol/L (ref 3.5–5.2)
Sodium: 144 mmol/L (ref 134–144)
Total Protein: 6.6 g/dL (ref 6.0–8.5)
eGFR: 45 mL/min/{1.73_m2} — ABNORMAL LOW (ref >59)

## 2021-02-08 LAB — ACETYLCHOLINE RECEPTOR AB, ALL
AChR Binding Ab, Serum: <0.03 nmol/L (ref 0.00–0.24)
Acetylchol Block Ab: 17 % (ref 0–25)

## 2021-02-08 LAB — CK: Total CK: 106 U/L (ref 32–182)

## 2021-02-11 ENCOUNTER — Other Ambulatory Visit: Payer: Self-pay | Admitting: Internal Medicine

## 2021-02-11 DIAGNOSIS — K222 Esophageal obstruction: Secondary | ICD-10-CM

## 2021-02-11 DIAGNOSIS — R1319 Other dysphagia: Secondary | ICD-10-CM

## 2021-02-14 ENCOUNTER — Encounter (HOSPITAL_COMMUNITY): Payer: Self-pay | Admitting: Psychiatry

## 2021-02-14 ENCOUNTER — Telehealth (INDEPENDENT_AMBULATORY_CARE_PROVIDER_SITE_OTHER): Payer: Medicare PPO | Admitting: Psychiatry

## 2021-02-14 DIAGNOSIS — F419 Anxiety disorder, unspecified: Secondary | ICD-10-CM

## 2021-02-14 DIAGNOSIS — F3342 Major depressive disorder, recurrent, in full remission: Secondary | ICD-10-CM

## 2021-02-14 MED ORDER — ARIPIPRAZOLE 5 MG PO TABS: 5.0000 mg | ORAL_TABLET | Freq: Every day | ORAL | 3 refills | Status: AC

## 2021-02-14 MED ORDER — BUPROPION HCL ER (XL) 150 MG PO TB24: 150.0000 mg | ORAL_TABLET | Freq: Every morning | ORAL | 3 refills | Status: AC

## 2021-02-14 MED ORDER — VENLAFAXINE HCL ER 75 MG PO CP24
ORAL_CAPSULE | ORAL | 3 refills | Status: AC
Start: 2021-02-14 — End: ?

## 2021-02-14 MED ORDER — VENLAFAXINE HCL ER 150 MG PO CP24: ORAL_CAPSULE | ORAL | 3 refills | Status: AC

## 2021-02-14 NOTE — Progress Notes (Signed)
[facility] MD/PA/NP OP Progress Note Virtual Visit via Telephone Note  I connected with Faith Santiago on 02/14/21 at  3:00 PM EDT by telephone and verified that I am speaking with the correct person using two identifiers.  Location: Patient: home Provider: Clinic   I discussed the limitations, risks, security and privacy concerns of performing an evaluation and management service by telephone and the availability of in person appointments. I also discussed with the patient that there may be a patient responsible charge related to this service. The patient expressed understanding and agreed to proceed.   I provided 30 minutes of non-face-to-face time during this encounter.  02/14/2021 3:42 PM Faith Santiago  MRN:  [id number]  Chief Complaint: "  I have been busy"   HPI: 80 year old female seen today for follow-up psychiatric evaluation.  She has a psychiatric history of depression and anxiety.  Currently she is managed on Abilify 5 mg daily, Xanax 0.25 mg nightly at bedtime, Wellbutrin 150 mg daily, and Effexor 225 mg nightly.  She notes her medications are effective in managing her psychiatric condition.  Today she was unable to login virtually so assessment was done over the phone.  During exam she was pleasant, cooperative, and engaged in conversation.  She informed Probation officer that since her last visit she has been very busy.  She notes that she and her husband moved to [place] in an independent living facility.  She informed Probation officer that her medications have been effective and notes that she continues to have minimal anxiety and depression.  Provider conducted a GAD-7 and patient scored an 11, at her last visit she scored an 11.  Provider also conducted a PHQ-9 and patient scored a 6, her last visit she scored a 7.  She endorsed adequate sleep and appetite.  Patient notes that she is adjusting to life at the independent living facility and notes that she finds the new environment peaceful.  She did asked  provider for resources to different mental health facilities in [place].  Provider gave patient's some resources and encouraged her to talk to her independent living facility staff to determine if medications could be managed within the facility.  She endorsed understanding and notes that there is a doctor who comes around and informed writer that she would look into this.  Today she denies SI/HI/VAH, mania, paranoia.   Provider asked patient if she has been falliem recently.  She notes that she has not fallen in a while.  She informed Probation officer that she saw neurology and received PT for 8 weeks.  She notes that she has minor back pain and notes that her primary care doctor is referring her for MRI to evaluate her back, neck, and brain.  Xanax not refilled today as Dr. Peggyann Shoals filled it on 02/05/2021.  Patient will continue all other medications as prescribed.  No other concerns at this time.    ICD-10-CM   1. Anxiety  F41.9     2. MDD (major depressive disorder), recurrent, in full remission ([place])  F33.42 ARIPiprazole (ABILIFY) 5 MG tablet    buPROPion (WELLBUTRIN XL) 150 MG 24 hr tablet    venlafaxine XR (EFFEXOR XR) 75 MG 24 hr capsule    venlafaxine XR (EFFEXOR-XR) 150 MG 24 hr capsule      Past Psychiatric History: Anxiety and depression  Past Medical History:  Past Medical History:  Diagnosis Date   Anemia, unspecified    Anxiety    Arthritis    hands  R>L, neck and lower back   Bimalleolar fracture of left ankle 11/19/2013   [place]   Cataract    COVID-19 05/2019   Depression    Esophageal reflux    GERD with stricture 06/03/2007   Glaucoma    History of osteopenia    DEXA WNL 2008, 2015   History of uterine cancer 1999   s/p hysterectomy   HLD (hyperlipidemia)    Narcolepsy    Thyroid disease    Uterine cancer [facility])     Past Surgical History:  Procedure Laterality Date   bilateral catarct removal  2010   COLONOSCOPY  05/2003   WNL (Dr Velora Heckler)   COLONOSCOPY  11/2017   3  small TA, diverticulisis, no f/u needed Carlean Purl)   DEXA  03/2014   WNL T score +3.9   ESOPHAGOGASTRODUODENOSCOPY  11/2017   dilated esoph stenosis, 8cm HH ([facility])   ESOPHAGOGASTRODUODENOSCOPY  12/2017   repeat dilation of esoph stenosis, 5cm HH - rec stay on PPI Carlean Purl)   ESOPHAGOGASTRODUODENOSCOPY  12/2019   GERD with esophagitis and peptic stricture s/p dilation to 58mm, mod sized HH - rec increase PPI to BID x 1 yr due to ongoing active inflammation on EGD Henrene Pastor)   ORIF ANKLE FRACTURE Left 11/19/2013   Procedure: LEFT ANKLE FRACTURE OPEN TREATMENT BIMALLEOLAR ANKLE INCLUDES INTERNAL FIXATION ;  Surgeon: Johnny Bridge, MD   RETINAL DETACHMENT REPAIR W/ SCLERAL BUCKLE LE Right 03/21/2016   TOTAL ABDOMINAL HYSTERECTOMY  1999   uterine cancer    Family Psychiatric History:  depression- mom  Family History:  Family History  Problem Relation Age of Onset   Hypothyroidism Mother    Stroke Mother        ministroke   Coronary artery disease Mother 75       s/p some heart surgery   Prostate cancer Father    Diabetes Neg Hx    Colon cancer Neg Hx    Esophageal cancer Neg Hx    Pancreatic cancer Neg Hx    Stomach cancer Neg Hx    Liver disease Neg Hx    Rectal cancer Neg Hx     Social History:  Social History   Socioeconomic History   Marital status: Married    Spouse name: Not on file   Number of children: 2   Years of education: Not on file   Highest education level: Not on file  Occupational History   Occupation: retired-occupational therapist  Tobacco Use   Smoking status: Never   Smokeless tobacco: Never  Vaping Use   Vaping Use: Never used  Substance and Sexual Activity   Alcohol use: Yes    Alcohol/week: 7.0 - 12.0 standard drinks    Types: 7 - 10 Glasses of wine per week    Comment: moderate   Drug use: No   Sexual activity: Not Currently  Other Topics Concern   Not on file  Social History Narrative   caffeine: none   Lives with husband, has 2 grown  children, 1 cat   Occupation: retired, 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[employer]   Activity: no regular exercise, to start silver sneakers   Diet: overeating, good amt water, vegetables daily, occasional fruits   Never smoker   EtOH 1/day   Social Determinants of Health   Financial Resource Strain: Low Risk    Difficulty of Paying Living Expenses: Not hard at all  Food Insecurity: No Food Insecurity   Worried About Charity fundraiser in the Last  Year: Never true   [place] in the Last Year: Never true  Transportation Needs: No Transportation Needs   Lack of Transportation (Medical): No   Lack of Transportation (Non-Medical): No  Physical Activity: Inactive   Days of Exercise per Week: 0 days   Minutes of Exercise per Session: 0 min  Stress: Stress Concern Present   Feeling of Stress : To some extent  Social Connections: Not on file    Allergies:  Allergies  Allergen Reactions   Ciprofloxacin     REACTION: sun induced rash    Metabolic Disorder Labs: Lab Results  Component Value Date   HGBA1C 6.0 09/05/2020   No results found for: PROLACTIN Lab Results  Component Value Date   CHOL 214 (H) 09/05/2020   TRIG 192.0 (H) 09/05/2020   HDL 75.60 09/05/2020   CHOLHDL 3 09/05/2020   VLDL 38.4 09/05/2020   LDLCALC 100 (H) 09/05/2020   LDLCALC 146 (H) 08/06/2016   Lab Results  Component Value Date   TSH 2.27 09/05/2020   TSH 1.73 10/29/2018    Therapeutic Level Labs: No results found for: LITHIUM No results found for: VALPROATE No components found for:  CBMZ  Current Medications: Current Outpatient Medications  Medication Sig Dispense Refill   ALPRAZolam (XANAX) 0.25 MG tablet Take 1 tablet (0.25 mg total) by mouth at bedtime as needed for anxiety. 30 tablet 2   ARIPiprazole (ABILIFY) 5 MG tablet Take 1 tablet (5 mg total) by mouth daily. 90 tablet 3   atorvastatin (LIPITOR) 20 MG tablet Take 1 tablet (20 mg total) by mouth daily. 90 tablet 3   buPROPion (WELLBUTRIN XL) 150 MG 24 hr  tablet Take 1 tablet (150 mg total) by mouth in the morning. 90 tablet 3   Cholecalciferol (VITAMIN D) 50 MCG (2000 UT) CAPS Take 1 capsule (2,000 Units total) by mouth daily. 30 capsule    Cyanocobalamin (B-12) 1000 MCG SUBL Place 1 tablet under the tongue daily.     levothyroxine (SYNTHROID) 50 MCG tablet Take 1 tablet (50 mcg total) by mouth daily. 90 tablet 3   omeprazole (PRILOSEC) 40 MG capsule TAKE 1 CAPSULE BY MOUTH TWICE A DAY BEFORE A MEAL 180 capsule 0   venlafaxine XR (EFFEXOR XR) 75 MG 24 hr capsule Take 1 capsule with 150 mg capsule daily 90 capsule 3   venlafaxine XR (EFFEXOR-XR) 150 MG 24 hr capsule Take 1 capsule with 75 mg capsule daily (total dose 225 mg daily) 90 capsule 3   No current facility-administered medications for this visit.     Musculoskeletal: Strength & Muscle Tone: within normal limits Gait & Station: normal Patient leans: N/A  Psychiatric Specialty Exam: Review of Systems  There were no vitals taken for this visit.There is no height or weight on file to calculate BMI.  General Appearance: Well Groomed  Eye Contact:  Good  Speech:  Clear and Coherent and Normal Rate  Volume:  Normal  Mood:  Euthymic  Affect:  Appropriate and Congruent  Thought Process:  Coherent, Goal Directed, and Linear  Orientation:  Full (Time, Place, and Person)  Thought Content: WDL and Logical   Suicidal Thoughts:  No  Homicidal Thoughts:  No  Memory:  Immediate;   Good Recent;   Good Remote;   Good  Judgement:  Good  Insight:  Good  Psychomotor Activity:  Normal  Concentration:  Concentration: Good and Attention Span: Good  Recall:  Good  Fund of Knowledge: Good  Language: Good  Akathisia:  No  Handed:  Right  AIMS (if indicated): not done  Assets:  Communication Skills Desire for Improvement Financial Resources/Insurance Housing Intimacy Social Support  ADL's:  Intact  Cognition: WNL  Sleep:  Good   Screenings: GAD-7    Flowsheet Row Video Visit from  02/14/2021 in [place] from 11/14/2020 in [facility] Counselor from 11/09/2020 in [place] from 05/10/2020 in [place] Office Visit from 06/01/2015 in [place] [facility]  Total GAD-7 Score 11 11 9 16 13       [place] from 10/15/2017 in [place] [facility] [place] from 04/02/2016 in [place] at [facility]  Total Score (max 30 points ) 20 20      PHQ2-9    Flowsheet Row Video Visit from 02/14/2021 in [place] from 11/14/2020 in [facility] Counselor from 11/09/2020 in [place] from 09/26/2020 in [place] from 09/06/2020 in [facility] [place]  PHQ-2 Total Score 1 2 6 2 2   PHQ-9 Total Score 6 7 13 3 2       Flowsheet Row Counselor from 12/14/2020 in [place] from 11/14/2020 in [facility] Counselor from 11/09/2020 in [place] No Risk No Risk No Risk        Assessment and Plan: Patient notes that she is doing well on her current medications.  Xanax not refilled today as Dr. Peggyann Shoals filled it on 02/05/2021.  Patient will continue all other medications as prescribed.  1. Anxiety Continue- venlafaxine XR (EFFEXOR XR) 75 MG 24 hr capsule; Take 1 capsule with 150 mg capsule daily  Dispense: 90 capsule; Refill: 3 Continue- venlafaxine XR (EFFEXOR-XR) 150 MG 24 hr capsule; Take 1 capsule with 75 mg capsule daily (total dose 225 mg daily)  Dispense: 90 capsule; Refill: 3  2. MDD (major depressive disorder), recurrent, in full remission ([place])  Continue- ARIPiprazole  (ABILIFY) 5 MG tablet; Take 1 tablet (5 mg total) by mouth daily.  Dispense: 90 tablet; Refill: 3 Continue- buPROPion (WELLBUTRIN XL) 150 MG 24 hr tablet; Take 1 tablet (150 mg total) by mouth in the morning.  Dispense: 90 tablet; Refill: 3 Continue- venlafaxine XR (EFFEXOR XR) 75 MG 24 hr capsule; Take 1 capsule with 150 mg capsule daily  Dispense: 90 capsule; Refill: 3 Continue- venlafaxine XR (EFFEXOR-XR) 150 MG 24 hr capsule; Take 1 capsule with 75 mg capsule daily (total dose 225 mg daily)  Dispense: 90 capsule; Refill: [place], NP 02/14/2021, 3:42 PM

## 2021-02-22 ENCOUNTER — Ambulatory Visit
Admission: RE | Admit: 2021-02-22 | Discharge: 2021-02-22 | Disposition: A | Discharge status: Home / Self Care | Payer: Medicare PPO | Source: Ambulatory Visit | Attending: Neurology | Admitting: Neurology

## 2021-02-22 ENCOUNTER — Other Ambulatory Visit: Payer: Self-pay

## 2021-02-22 DIAGNOSIS — G8929 Other chronic pain: Secondary | ICD-10-CM

## 2021-02-22 DIAGNOSIS — R531 Weakness: Secondary | ICD-10-CM

## 2021-02-22 DIAGNOSIS — R159 Full incontinence of feces: Secondary | ICD-10-CM

## 2021-02-22 DIAGNOSIS — Z9181 History of falling: Secondary | ICD-10-CM

## 2021-02-22 DIAGNOSIS — R269 Unspecified abnormalities of gait and mobility: Secondary | ICD-10-CM

## 2021-02-22 DIAGNOSIS — M545 Low back pain, unspecified: Secondary | ICD-10-CM

## 2021-02-22 IMAGING — MR MR HEAD W/O CM
10 series · 48 of 48 positions shown · non-contrast
Comparison: None.

CLINICAL DATA: Gait abnormality. Weakness. Frequent falls. Visual
changes. Fecal incontinence. Chronic low back pain. History of
uterine cancer.

EXAM:
MRI HEAD WITHOUT CONTRAST
TECHNIQUE: Multiplanar, multiecho pulse sequences of the brain and surrounding
structures were obtained without intravenous contrast.

[Series 9: T1 · sagittal · 5.0mm · 0.34mm/px · 3 of 21 slices shown (1 of 2)]
[im 1/21]
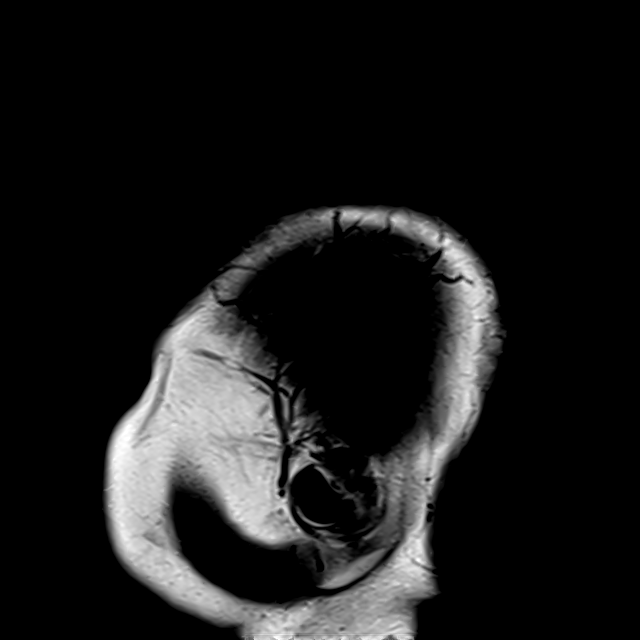
[im 11/21]
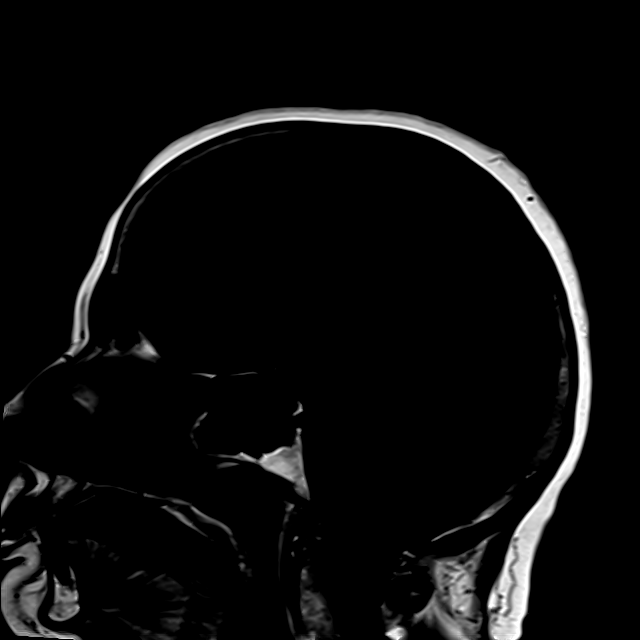
[im 21/21]
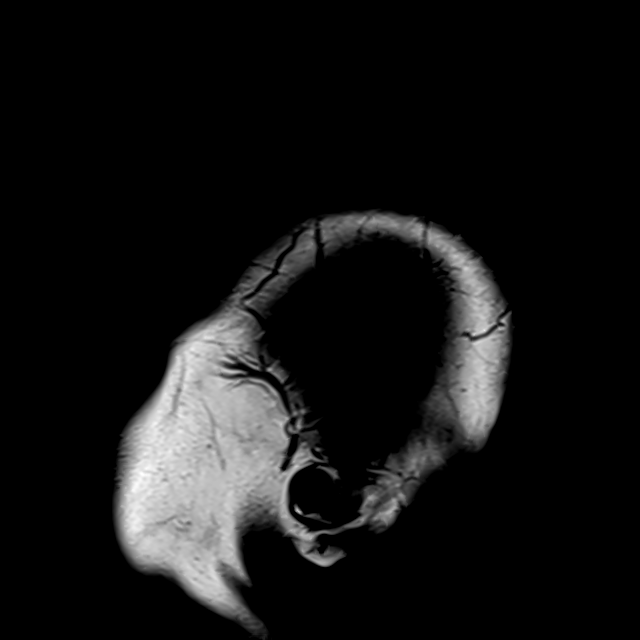

[Series 10: DWI · axial · 3.0mm · 1.44mm/px · z∈[-82,+52]mm · 7 of 86 slices shown (1 of 4)]
[im 1/86]
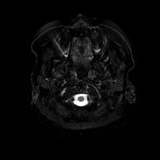
[im 15/86]
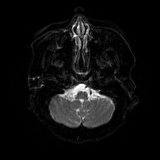
[im 29/86]
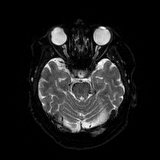
[im 43/86]
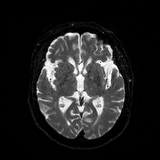
[im 57/86]
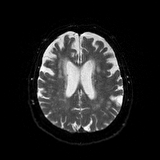
[im 71/86]
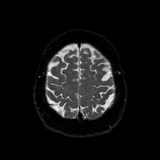
[im 86/86]
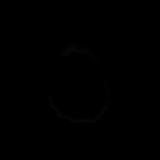

[Series 11: DWI · axial · 3.0mm · 1.44mm/px · z∈[-82,+52]mm · 4 of 43 slices shown (2 of 4)]
[im 1/43]
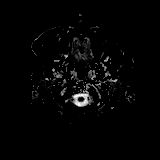
[im 15/43]
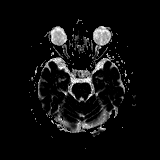
[im 29/43]
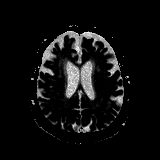
[im 43/43]
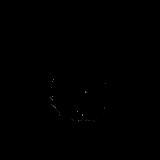

[Series 12: DWI · coronal · 5.0mm · 1.44mm/px · 5 of 60 slices shown (3 of 4)]
[im 1/60]
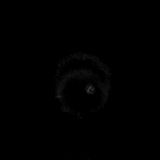
[im 15/60]
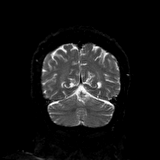
[im 30/60]
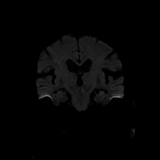
[im 45/60]
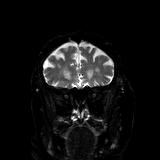
[im 60/60]
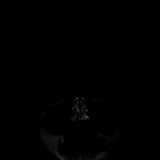

[Series 13: DWI · coronal · 5.0mm · 1.44mm/px · 2 of 28 slices shown (4 of 4)]
[im 1/28]
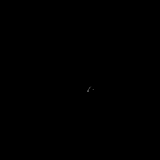
[im 28/28]
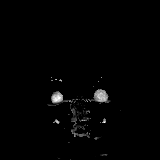

[Series 14: T2 · axial · 4.0mm · 0.36mm/px · z∈[-84,+56]mm · 2 of 29 slices shown (1 of 2)]
[im 1/29]
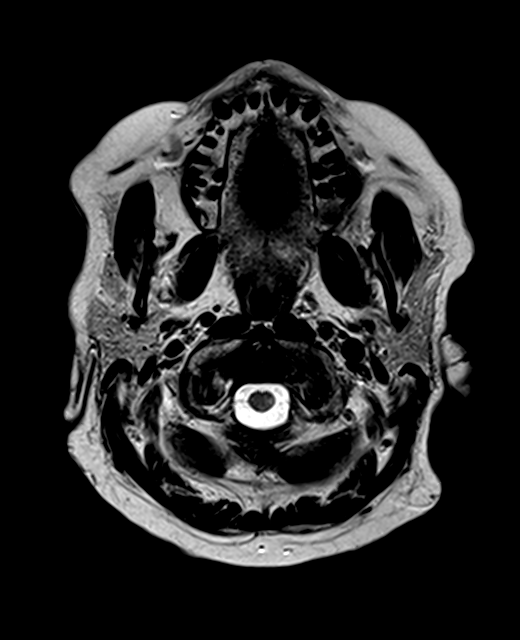
[im 29/29]
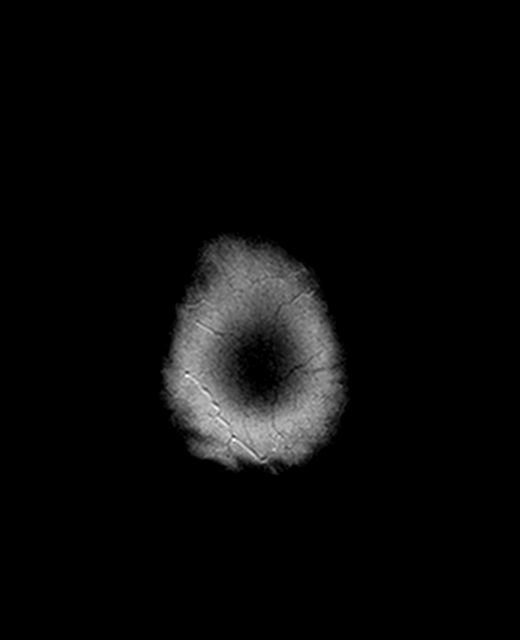

[Series 15: FLAIR · axial · 3.0mm · 0.72mm/px · z∈[-88,+57]mm · 2 of 26 slices shown]
[im 1/26]
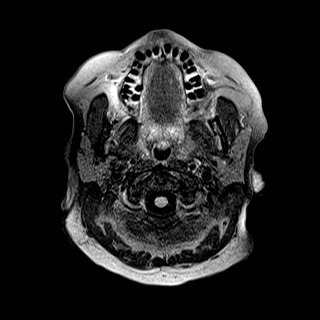
[im 26/26]
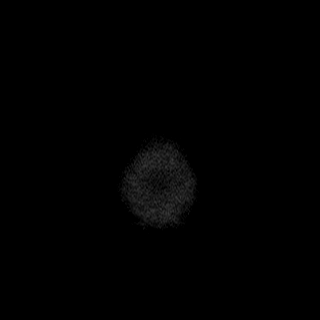

[Series 16: swi_images · axial · 1.5mm · 0.90mm/px · z∈[-82,+55]mm · 8 of 96 slices shown]
[im 1/96]
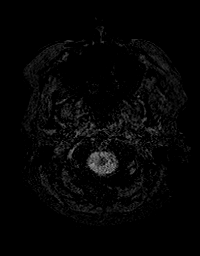
[im 14/96]
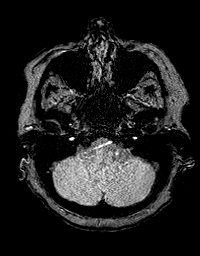
[im 28/96]
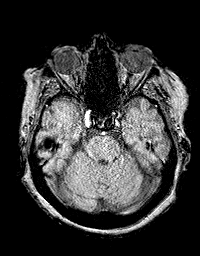
[im 41/96]
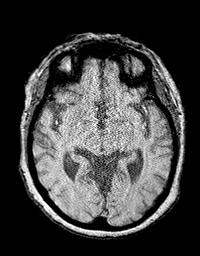
[im 55/96]
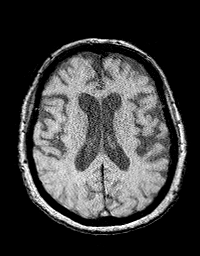
[im 68/96]
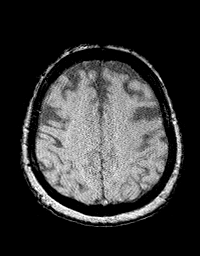
[im 82/96]
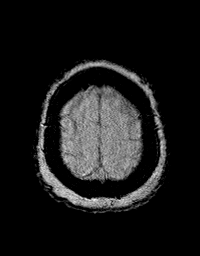
[im 96/96]
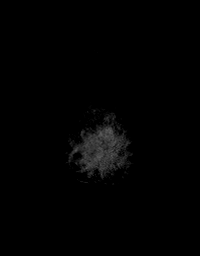

[Series 18: T1 · axial · 1.0mm · 0.90mm/px · z∈[-82,+56]mm · 12 of 144 slices shown (2 of 2)]
[im 1/144]
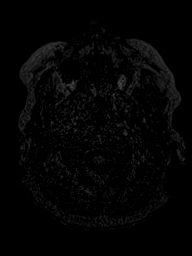
[im 14/144]
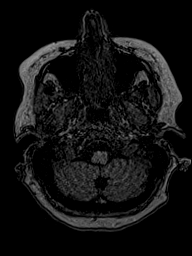
[im 27/144]
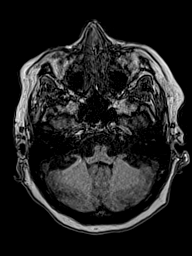
[im 40/144]
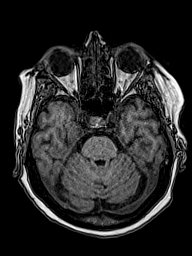
[im 53/144]
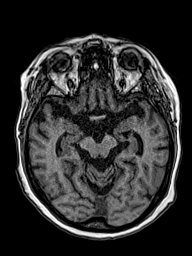
[im 66/144]
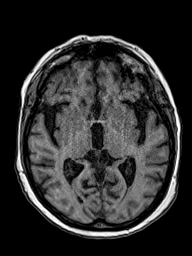
[im 79/144]
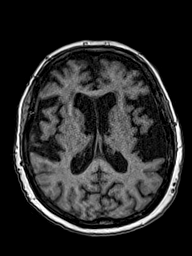
[im 92/144]
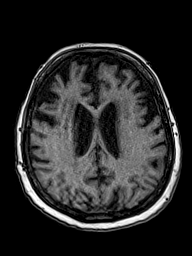
[im 105/144]
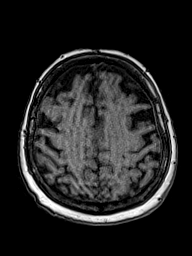
[im 118/144]
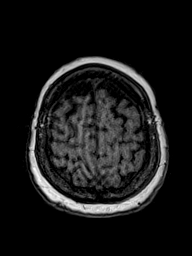
[im 131/144]
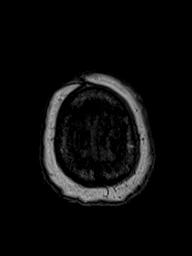
[im 144/144]
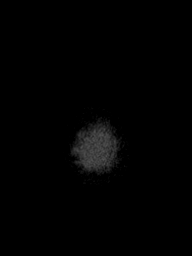

[Series 19: T2 · coronal · 4.0mm · 0.36mm/px · 3 of 35 slices shown (2 of 2)]
[im 1/35]
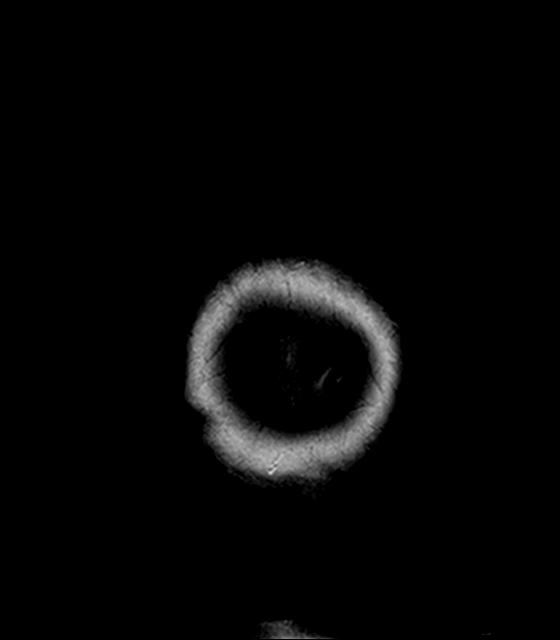
[im 18/35]
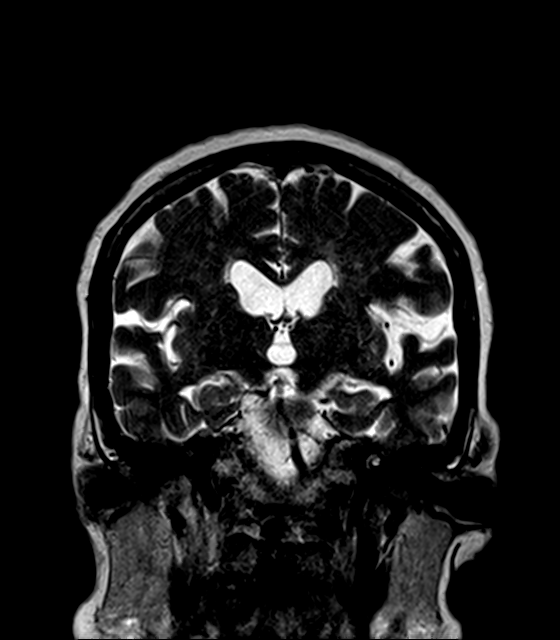
[im 35/35]
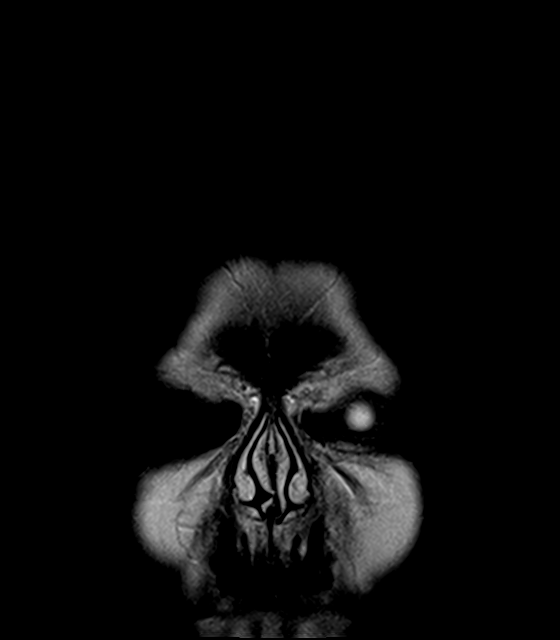

[48 of 48 positions shown; findings below may reference images not displayed]

FINDINGS: Brain: There is no evidence of an acute infarct, intracranial
hemorrhage, mass, midline shift, or extra-axial fluid collection. T2
hyperintensities in the cerebral white matter and pons are
nonspecific but compatible with moderate chronic small vessel
ischemic disease. There is moderate generalized cerebral atrophy. No
focal cerebellar insult is evident.

Vascular: Major intracranial vascular flow voids are preserved.

Skull and upper cervical spine: Unremarkable bone marrow signal.

Sinuses/Orbits: Bilateral cataract extraction. Right scleral buckle.
Minimal mucosal thickening in the paranasal sinuses. Trace left
mastoid fluid.

Other: None.
IMPRESSION: 1. No acute intracranial abnormality.
2. Moderate chronic small vessel ischemic disease and cerebral
atrophy.

## 2021-02-22 IMAGING — MR MR LUMBAR SPINE W/O CM
4 of 5 series · 18 of 48 positions shown · non-contrast
Comparison: None.

CLINICAL DATA: Gait abnormality. Weakness. Frequent falls. Chronic
low back pain. History of uterine cancer.

EXAM:
MRI LUMBAR SPINE WITHOUT CONTRAST
TECHNIQUE: Multiplanar, multisequence MR imaging of the lumbar spine was
performed. No intravenous contrast was administered.

[Series 6: T2 · sagittal · 4.0mm · 0.73mm/px · 6 of 15 slices shown (1 of 2)]
[im 1/15]
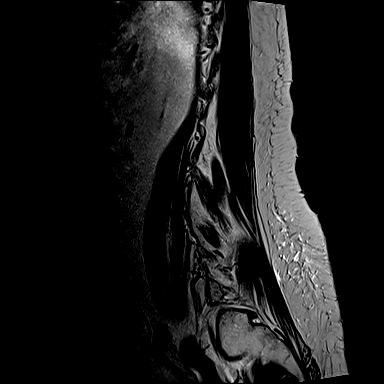
[im 3/15]
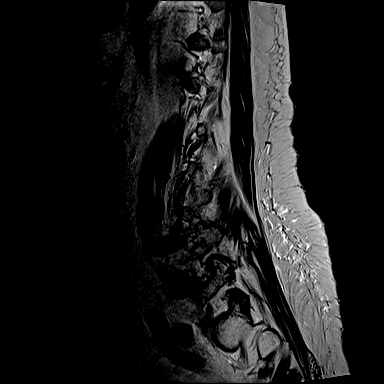
[im 6/15]
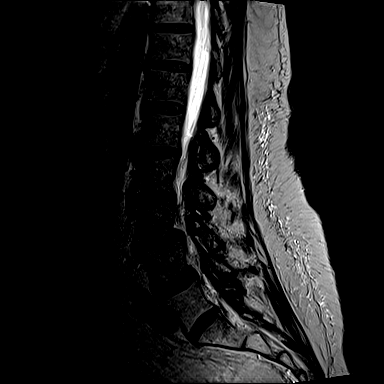
[im 9/15]
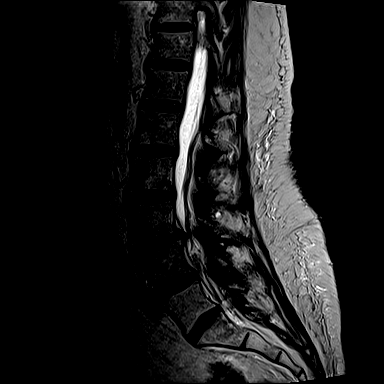
[im 12/15]
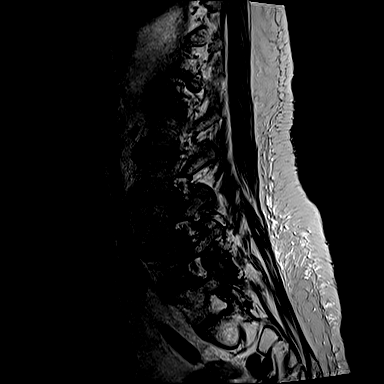
[im 15/15]
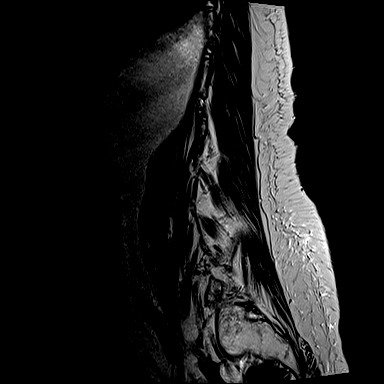

[Series 7: T1 · sagittal · 4.0mm · 0.88mm/px · 3 of 15 slices shown (1 of 2)]
[im 3/15]
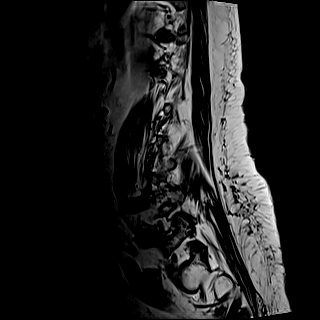
[im 9/15]
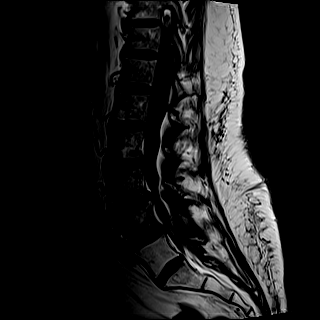
[im 15/15]
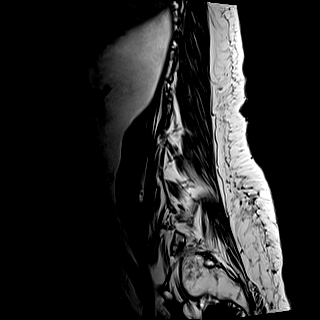

[Series 11: T1 · axial · 4.0mm · 0.28mm/px · z∈[-652,-486]mm · 3 of 39 slices shown (2 of 2)]
[im 6/39]
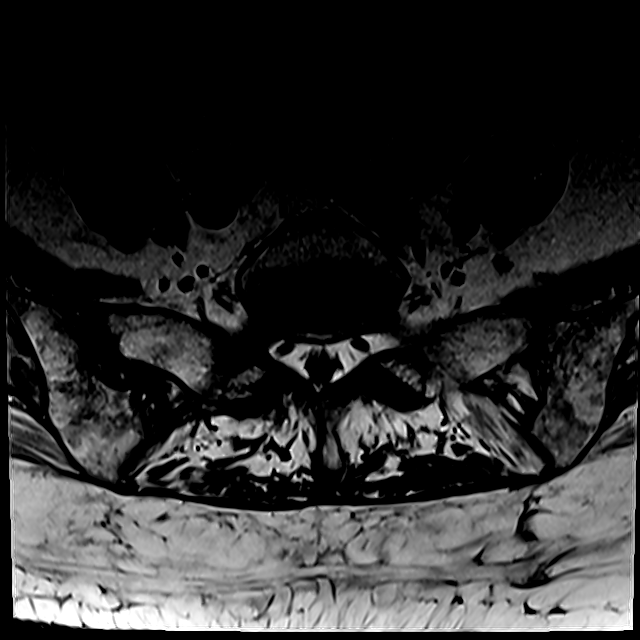
[im 20/39]
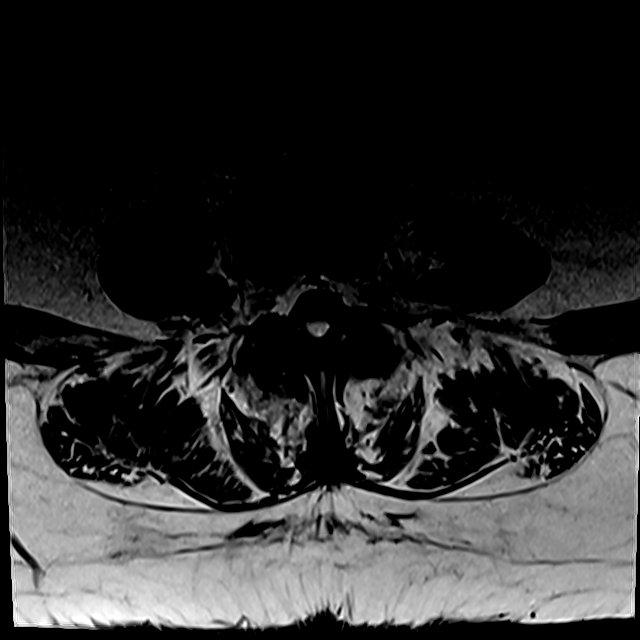
[im 33/39]
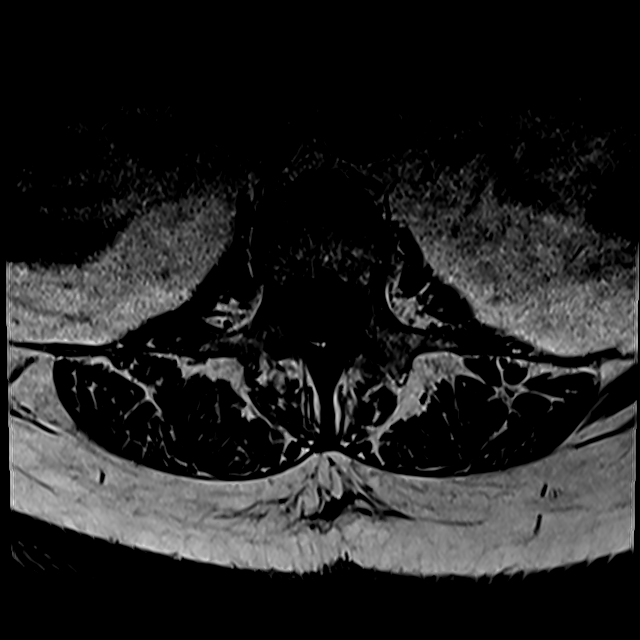

[Series 14: T2 · axial · 4.0mm · 0.28mm/px · z∈[-677,-486]mm · 6 of 39 slices shown (2 of 2)]
[im 1/39]
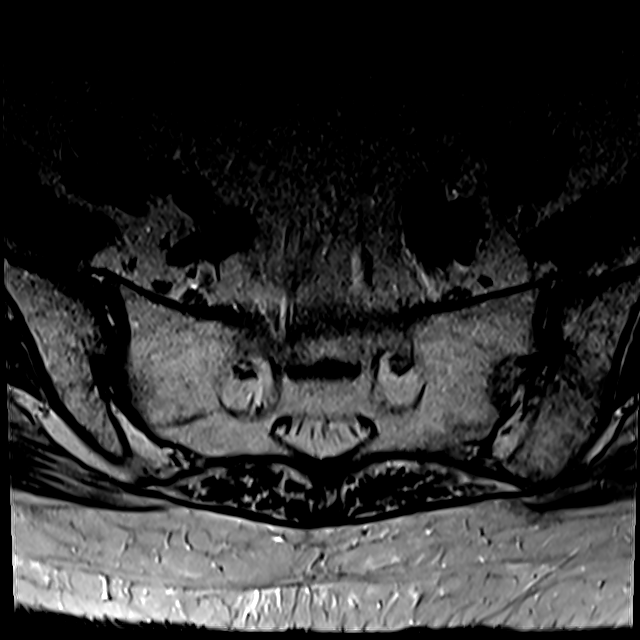
[im 6/39]
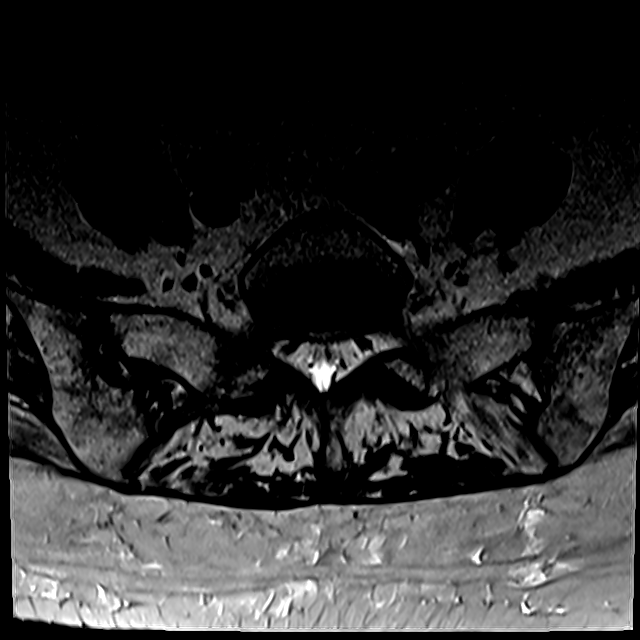
[im 11/39]
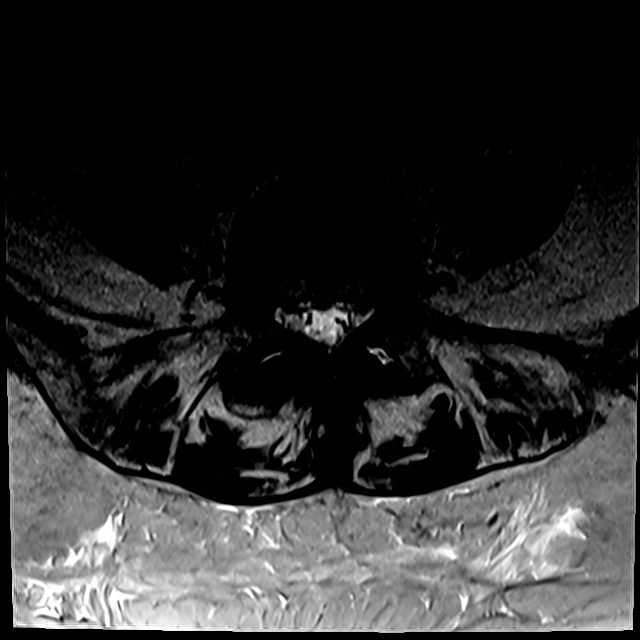
[im 17/39]
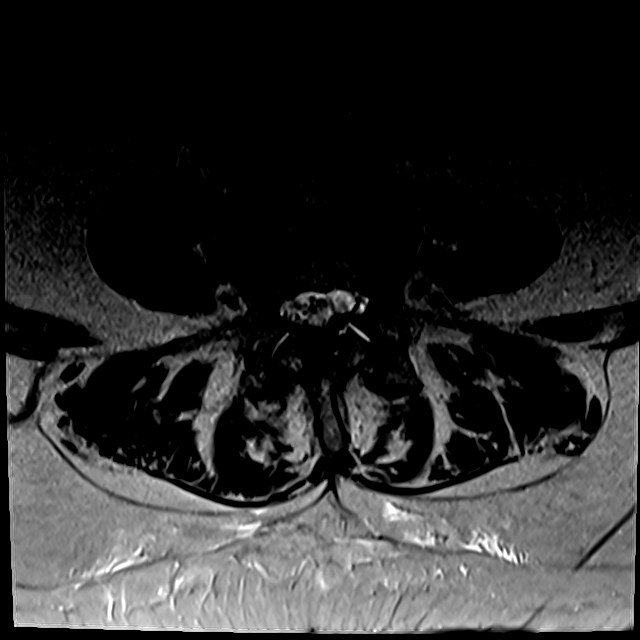
[im 20/39]
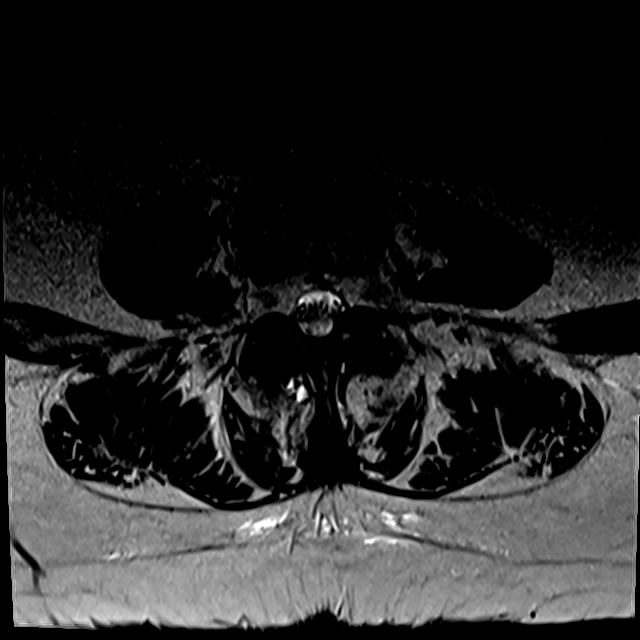
[im 33/39]
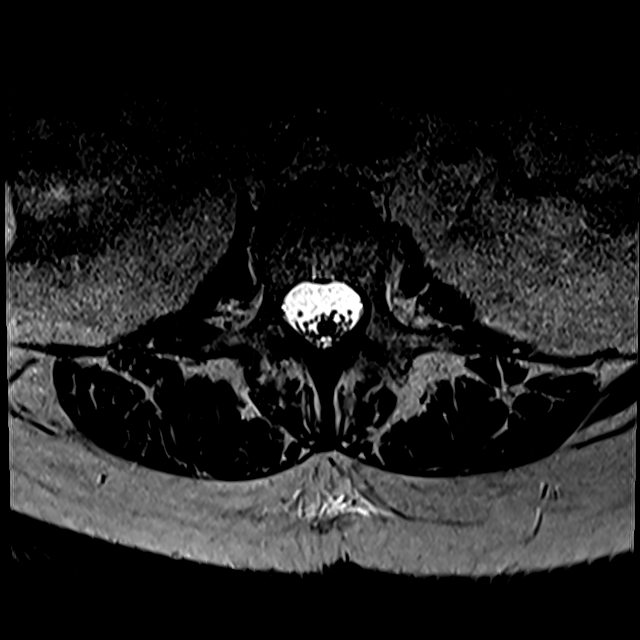

[18 of 48 positions shown; findings below may reference images not displayed]

FINDINGS: Segmentation:  Standard.

Alignment:  3 mm anterolisthesis of L3 on L4, facet mediated.

Vertebrae: No fracture, suspicious marrow lesion, or significant
marrow edema. Prominent fatty marrow signal at L5 and in the
included portion of the sacrum, likely related to prior radiation
therapy.

Conus medullaris and cauda equina: Conus extends to the L1 level.
Conus and cauda equina appear normal.

Paraspinal and other soft tissues: Unremarkable.

Disc levels:

Disc desiccation throughout the lumbar spine with moderate disc
space narrowing at L3-4.

T12-L1: Negative.

L1-2: Minimal disc bulging and moderate facet and ligamentum flavum
hypertrophy without stenosis.

L2-3: Disc bulging, a right foraminal disc protrusion, epidural
lipomatosis, and moderate facet and ligamentum flavum hypertrophy
result in mild spinal stenosis and mild right neural foraminal
stenosis.

L3-4: Anterolisthesis with bulging uncovered disc, epidural
lipomatosis, and severe facet and ligamentum flavum hypertrophy
result in severe spinal stenosis (4 mm AP spinal canal diameter) and
mild bilateral neural foraminal stenosis.

L4-5: Mild disc bulging, epidural lipomatosis, and severe facet and
ligamentum flavum hypertrophy result in mild-to-moderate spinal
stenosis without neural foraminal stenosis. Left larger than right
facet joint effusions.

L5-S1: Severe right and moderate left facet arthrosis without disc
herniation or stenosis.
IMPRESSION: 1. Widespread severe lumbar facet arthrosis and epidural
lipomatosis.
2. Grade 1 anterolisthesis and severe spinal stenosis at L3-4.
3. Mild-to-moderate spinal stenosis at L4-5 and mild spinal stenosis
at L2-3.

## 2021-03-05 ENCOUNTER — Telehealth: Payer: Self-pay | Admitting: Neurology

## 2021-03-05 NOTE — Telephone Encounter (Signed)
Attempted to call pt, LVM for call back  °

## 2021-03-05 NOTE — Telephone Encounter (Signed)
IMPRESSION: 1. Widespread severe lumbar facet arthrosis and epidural lipomatosis. 2. Grade 1 anterolisthesis and severe spinal stenosis at L3-4. 3. Mild-to-moderate spinal stenosis at L4-5 and mild spinal stenosis at L2-3.  IMPRESSION: 1. No acute intracranial abnormality. 2. Moderate chronic small vessel ischemic disease and cerebral atrophy.  IMPRESSION: 1. Disc degeneration greatest at C5-6 where there is moderate to severe right neural foraminal stenosis. 2. Moderate left neural foraminal stenosis at C4-5. 3. No spinal stenosis. 4. Severe multilevel facet arthrosis. 5. Normal cord signal.   Please call patient, MRI of the brain showed moderate atrophy, small vessel disease, MRI of cervical spine showed multilevel degenerative changes, no evidence of spinal cord compression, variable degree of foraminal narrowing  MRI of lumbar spine showed widespread degenerative changes, severe spinal stenosis L3-4  From the record, she moved to independent living in [place], she may have access to her MRI report, continue further care by local neurologist

## 2021-03-06 NOTE — Telephone Encounter (Signed)
I spoke to the patient and her husband. Provided the results below. They have only been in [place] for about a week now (still unpacking). Not yet established with even a PCP at this point. I recommended they call their insurance plan for the names of local physicians in network for them. Once they get the names of the new practices, they will provide our office with the information so we can forward records.

## 2021-03-16 DIAGNOSIS — R2681 Unsteadiness on feet: Secondary | ICD-10-CM | POA: Diagnosis not present

## 2021-03-16 DIAGNOSIS — M6281 Muscle weakness (generalized): Secondary | ICD-10-CM | POA: Diagnosis not present

## 2021-03-20 DIAGNOSIS — R2681 Unsteadiness on feet: Secondary | ICD-10-CM | POA: Diagnosis not present

## 2021-03-20 DIAGNOSIS — M6281 Muscle weakness (generalized): Secondary | ICD-10-CM | POA: Diagnosis not present

## 2021-03-22 ENCOUNTER — Other Ambulatory Visit: Payer: Medicare PPO

## 2021-03-23 DIAGNOSIS — R2681 Unsteadiness on feet: Secondary | ICD-10-CM | POA: Diagnosis not present

## 2021-03-23 DIAGNOSIS — M6281 Muscle weakness (generalized): Secondary | ICD-10-CM | POA: Diagnosis not present

## 2021-03-26 DIAGNOSIS — R2681 Unsteadiness on feet: Secondary | ICD-10-CM | POA: Diagnosis not present

## 2021-03-26 DIAGNOSIS — M6281 Muscle weakness (generalized): Secondary | ICD-10-CM | POA: Diagnosis not present

## 2021-03-27 DIAGNOSIS — R2681 Unsteadiness on feet: Secondary | ICD-10-CM | POA: Diagnosis not present

## 2021-03-27 DIAGNOSIS — M6281 Muscle weakness (generalized): Secondary | ICD-10-CM | POA: Diagnosis not present

## 2021-03-30 DIAGNOSIS — R2681 Unsteadiness on feet: Secondary | ICD-10-CM | POA: Diagnosis not present

## 2021-03-30 DIAGNOSIS — M6281 Muscle weakness (generalized): Secondary | ICD-10-CM | POA: Diagnosis not present

## 2021-03-30 DIAGNOSIS — H34832 Tributary (branch) retinal vein occlusion, left eye, with macular edema: Secondary | ICD-10-CM | POA: Diagnosis not present

## 2021-04-03 DIAGNOSIS — M6281 Muscle weakness (generalized): Secondary | ICD-10-CM | POA: Diagnosis not present

## 2021-04-03 DIAGNOSIS — R2681 Unsteadiness on feet: Secondary | ICD-10-CM | POA: Diagnosis not present

## 2021-04-04 DIAGNOSIS — M6281 Muscle weakness (generalized): Secondary | ICD-10-CM | POA: Diagnosis not present

## 2021-04-04 DIAGNOSIS — R2681 Unsteadiness on feet: Secondary | ICD-10-CM | POA: Diagnosis not present

## 2021-04-06 DIAGNOSIS — M6281 Muscle weakness (generalized): Secondary | ICD-10-CM | POA: Diagnosis not present

## 2021-04-06 DIAGNOSIS — R2681 Unsteadiness on feet: Secondary | ICD-10-CM | POA: Diagnosis not present

## 2021-04-09 DIAGNOSIS — M6281 Muscle weakness (generalized): Secondary | ICD-10-CM | POA: Diagnosis not present

## 2021-04-09 DIAGNOSIS — R2681 Unsteadiness on feet: Secondary | ICD-10-CM | POA: Diagnosis not present

## 2021-04-11 DIAGNOSIS — R2681 Unsteadiness on feet: Secondary | ICD-10-CM | POA: Diagnosis not present

## 2021-04-11 DIAGNOSIS — M6281 Muscle weakness (generalized): Secondary | ICD-10-CM | POA: Diagnosis not present

## 2021-04-13 DIAGNOSIS — R2681 Unsteadiness on feet: Secondary | ICD-10-CM | POA: Diagnosis not present

## 2021-04-13 DIAGNOSIS — M6281 Muscle weakness (generalized): Secondary | ICD-10-CM | POA: Diagnosis not present

## 2021-04-16 DIAGNOSIS — R2681 Unsteadiness on feet: Secondary | ICD-10-CM | POA: Diagnosis not present

## 2021-04-16 DIAGNOSIS — M6281 Muscle weakness (generalized): Secondary | ICD-10-CM | POA: Diagnosis not present

## 2021-04-18 DIAGNOSIS — R2681 Unsteadiness on feet: Secondary | ICD-10-CM | POA: Diagnosis not present

## 2021-04-18 DIAGNOSIS — M6281 Muscle weakness (generalized): Secondary | ICD-10-CM | POA: Diagnosis not present

## 2021-04-20 DIAGNOSIS — R2681 Unsteadiness on feet: Secondary | ICD-10-CM | POA: Diagnosis not present

## 2021-04-20 DIAGNOSIS — M6281 Muscle weakness (generalized): Secondary | ICD-10-CM | POA: Diagnosis not present

## 2021-04-23 DIAGNOSIS — R2681 Unsteadiness on feet: Secondary | ICD-10-CM | POA: Diagnosis not present

## 2021-04-23 DIAGNOSIS — M6281 Muscle weakness (generalized): Secondary | ICD-10-CM | POA: Diagnosis not present

## 2021-04-25 DIAGNOSIS — M6281 Muscle weakness (generalized): Secondary | ICD-10-CM | POA: Diagnosis not present

## 2021-04-25 DIAGNOSIS — R2681 Unsteadiness on feet: Secondary | ICD-10-CM | POA: Diagnosis not present

## 2021-04-26 DIAGNOSIS — M6281 Muscle weakness (generalized): Secondary | ICD-10-CM | POA: Diagnosis not present

## 2021-04-26 DIAGNOSIS — R2681 Unsteadiness on feet: Secondary | ICD-10-CM | POA: Diagnosis not present

## 2021-04-27 DIAGNOSIS — H34832 Tributary (branch) retinal vein occlusion, left eye, with macular edema: Secondary | ICD-10-CM | POA: Diagnosis not present

## 2021-05-02 DIAGNOSIS — M6281 Muscle weakness (generalized): Secondary | ICD-10-CM | POA: Diagnosis not present

## 2021-05-02 DIAGNOSIS — R2681 Unsteadiness on feet: Secondary | ICD-10-CM | POA: Diagnosis not present

## 2021-05-04 DIAGNOSIS — M6281 Muscle weakness (generalized): Secondary | ICD-10-CM | POA: Diagnosis not present

## 2021-05-04 DIAGNOSIS — R2681 Unsteadiness on feet: Secondary | ICD-10-CM | POA: Diagnosis not present

## 2021-05-07 DIAGNOSIS — R2681 Unsteadiness on feet: Secondary | ICD-10-CM | POA: Diagnosis not present

## 2021-05-07 DIAGNOSIS — M6281 Muscle weakness (generalized): Secondary | ICD-10-CM | POA: Diagnosis not present

## 2021-05-09 DIAGNOSIS — M6281 Muscle weakness (generalized): Secondary | ICD-10-CM | POA: Diagnosis not present

## 2021-05-09 DIAGNOSIS — R2681 Unsteadiness on feet: Secondary | ICD-10-CM | POA: Diagnosis not present

## 2021-05-11 DIAGNOSIS — M6281 Muscle weakness (generalized): Secondary | ICD-10-CM | POA: Diagnosis not present

## 2021-05-11 DIAGNOSIS — R2681 Unsteadiness on feet: Secondary | ICD-10-CM | POA: Diagnosis not present

## 2021-05-14 DIAGNOSIS — M6281 Muscle weakness (generalized): Secondary | ICD-10-CM | POA: Diagnosis not present

## 2021-05-14 DIAGNOSIS — R2681 Unsteadiness on feet: Secondary | ICD-10-CM | POA: Diagnosis not present

## 2021-05-16 DIAGNOSIS — M6281 Muscle weakness (generalized): Secondary | ICD-10-CM | POA: Diagnosis not present

## 2021-05-16 DIAGNOSIS — R2681 Unsteadiness on feet: Secondary | ICD-10-CM | POA: Diagnosis not present

## 2021-05-18 DIAGNOSIS — R2681 Unsteadiness on feet: Secondary | ICD-10-CM | POA: Diagnosis not present

## 2021-05-18 DIAGNOSIS — M6281 Muscle weakness (generalized): Secondary | ICD-10-CM | POA: Diagnosis not present

## 2021-05-22 DIAGNOSIS — E785 Hyperlipidemia, unspecified: Secondary | ICD-10-CM | POA: Diagnosis not present

## 2021-05-22 DIAGNOSIS — R7303 Prediabetes: Secondary | ICD-10-CM | POA: Diagnosis not present

## 2021-05-22 DIAGNOSIS — M545 Low back pain, unspecified: Secondary | ICD-10-CM | POA: Diagnosis not present

## 2021-05-22 DIAGNOSIS — F332 Major depressive disorder, recurrent severe without psychotic features: Secondary | ICD-10-CM | POA: Diagnosis not present

## 2021-05-22 DIAGNOSIS — Z9181 History of falling: Secondary | ICD-10-CM | POA: Diagnosis not present

## 2021-05-22 DIAGNOSIS — N289 Disorder of kidney and ureter, unspecified: Secondary | ICD-10-CM | POA: Diagnosis not present

## 2021-05-22 DIAGNOSIS — E538 Deficiency of other specified B group vitamins: Secondary | ICD-10-CM | POA: Diagnosis not present

## 2021-05-22 DIAGNOSIS — M48061 Spinal stenosis, lumbar region without neurogenic claudication: Secondary | ICD-10-CM | POA: Diagnosis not present

## 2021-05-22 DIAGNOSIS — R269 Unspecified abnormalities of gait and mobility: Secondary | ICD-10-CM | POA: Diagnosis not present

## 2021-05-22 DIAGNOSIS — E039 Hypothyroidism, unspecified: Secondary | ICD-10-CM | POA: Diagnosis not present

## 2021-05-22 DIAGNOSIS — E559 Vitamin D deficiency, unspecified: Secondary | ICD-10-CM | POA: Diagnosis not present

## 2021-05-22 DIAGNOSIS — Z23 Encounter for immunization: Secondary | ICD-10-CM | POA: Diagnosis not present

## 2021-05-25 DIAGNOSIS — H34832 Tributary (branch) retinal vein occlusion, left eye, with macular edema: Secondary | ICD-10-CM | POA: Diagnosis not present

## 2021-05-31 DIAGNOSIS — M545 Low back pain, unspecified: Secondary | ICD-10-CM | POA: Diagnosis not present

## 2021-05-31 DIAGNOSIS — M48061 Spinal stenosis, lumbar region without neurogenic claudication: Secondary | ICD-10-CM | POA: Diagnosis not present

## 2021-05-31 DIAGNOSIS — G8929 Other chronic pain: Secondary | ICD-10-CM | POA: Diagnosis not present

## 2021-05-31 DIAGNOSIS — M47816 Spondylosis without myelopathy or radiculopathy, lumbar region: Secondary | ICD-10-CM | POA: Diagnosis not present

## 2021-05-31 DIAGNOSIS — M4316 Spondylolisthesis, lumbar region: Secondary | ICD-10-CM | POA: Diagnosis not present

## 2021-06-06 DIAGNOSIS — M47816 Spondylosis without myelopathy or radiculopathy, lumbar region: Secondary | ICD-10-CM | POA: Diagnosis not present

## 2021-06-06 DIAGNOSIS — M545 Low back pain, unspecified: Secondary | ICD-10-CM | POA: Diagnosis not present

## 2021-06-06 DIAGNOSIS — M48061 Spinal stenosis, lumbar region without neurogenic claudication: Secondary | ICD-10-CM | POA: Diagnosis not present

## 2021-06-19 DIAGNOSIS — R1310 Dysphagia, unspecified: Secondary | ICD-10-CM | POA: Diagnosis not present

## 2021-06-19 DIAGNOSIS — K222 Esophageal obstruction: Secondary | ICD-10-CM | POA: Diagnosis not present

## 2021-06-19 DIAGNOSIS — K219 Gastro-esophageal reflux disease without esophagitis: Secondary | ICD-10-CM | POA: Diagnosis not present

## 2021-06-20 DIAGNOSIS — R7303 Prediabetes: Secondary | ICD-10-CM | POA: Diagnosis not present

## 2021-06-20 DIAGNOSIS — R269 Unspecified abnormalities of gait and mobility: Secondary | ICD-10-CM | POA: Diagnosis not present

## 2021-06-20 DIAGNOSIS — E785 Hyperlipidemia, unspecified: Secondary | ICD-10-CM | POA: Diagnosis not present

## 2021-06-20 DIAGNOSIS — E559 Vitamin D deficiency, unspecified: Secondary | ICD-10-CM | POA: Diagnosis not present

## 2021-06-20 DIAGNOSIS — N289 Disorder of kidney and ureter, unspecified: Secondary | ICD-10-CM | POA: Diagnosis not present

## 2021-06-20 DIAGNOSIS — F332 Major depressive disorder, recurrent severe without psychotic features: Secondary | ICD-10-CM | POA: Diagnosis not present

## 2021-06-20 DIAGNOSIS — E039 Hypothyroidism, unspecified: Secondary | ICD-10-CM | POA: Diagnosis not present

## 2021-06-22 DIAGNOSIS — H34832 Tributary (branch) retinal vein occlusion, left eye, with macular edema: Secondary | ICD-10-CM | POA: Diagnosis not present

## 2021-07-20 DIAGNOSIS — H34832 Tributary (branch) retinal vein occlusion, left eye, with macular edema: Secondary | ICD-10-CM | POA: Diagnosis not present

## 2021-08-31 DIAGNOSIS — H34832 Tributary (branch) retinal vein occlusion, left eye, with macular edema: Secondary | ICD-10-CM | POA: Diagnosis not present

## 2021-09-07 ENCOUNTER — Ambulatory Visit: Payer: Medicare PPO

## 2021-10-12 DIAGNOSIS — Z8669 Personal history of other diseases of the nervous system and sense organs: Secondary | ICD-10-CM | POA: Diagnosis not present

## 2021-10-12 DIAGNOSIS — H43812 Vitreous degeneration, left eye: Secondary | ICD-10-CM | POA: Diagnosis not present

## 2021-10-12 DIAGNOSIS — Z961 Presence of intraocular lens: Secondary | ICD-10-CM | POA: Diagnosis not present

## 2021-10-12 DIAGNOSIS — H34832 Tributary (branch) retinal vein occlusion, left eye, with macular edema: Secondary | ICD-10-CM | POA: Diagnosis not present

## 2021-11-27 DIAGNOSIS — H34812 Central retinal vein occlusion, left eye, with macular edema: Secondary | ICD-10-CM | POA: Diagnosis not present

## 2022-01-22 DIAGNOSIS — H34812 Central retinal vein occlusion, left eye, with macular edema: Secondary | ICD-10-CM | POA: Diagnosis not present

## 2022-01-22 DIAGNOSIS — Z8669 Personal history of other diseases of the nervous system and sense organs: Secondary | ICD-10-CM | POA: Diagnosis not present

## 2022-01-22 DIAGNOSIS — H43812 Vitreous degeneration, left eye: Secondary | ICD-10-CM | POA: Diagnosis not present

## 2022-01-22 DIAGNOSIS — Z961 Presence of intraocular lens: Secondary | ICD-10-CM | POA: Diagnosis not present

## 2022-03-12 DIAGNOSIS — H34812 Central retinal vein occlusion, left eye, with macular edema: Secondary | ICD-10-CM | POA: Diagnosis not present

## 2022-04-23 DIAGNOSIS — H34812 Central retinal vein occlusion, left eye, with macular edema: Secondary | ICD-10-CM | POA: Diagnosis not present

## 2022-05-15 DIAGNOSIS — R2681 Unsteadiness on feet: Secondary | ICD-10-CM | POA: Diagnosis not present

## 2022-05-15 DIAGNOSIS — F325 Major depressive disorder, single episode, in full remission: Secondary | ICD-10-CM | POA: Diagnosis not present

## 2022-05-15 DIAGNOSIS — E785 Hyperlipidemia, unspecified: Secondary | ICD-10-CM | POA: Diagnosis not present

## 2022-05-15 DIAGNOSIS — M1990 Unspecified osteoarthritis, unspecified site: Secondary | ICD-10-CM | POA: Diagnosis not present

## 2022-05-15 DIAGNOSIS — Z8249 Family history of ischemic heart disease and other diseases of the circulatory system: Secondary | ICD-10-CM | POA: Diagnosis not present

## 2022-05-15 DIAGNOSIS — N3941 Urge incontinence: Secondary | ICD-10-CM | POA: Diagnosis not present

## 2022-05-15 DIAGNOSIS — F419 Anxiety disorder, unspecified: Secondary | ICD-10-CM | POA: Diagnosis not present

## 2022-05-15 DIAGNOSIS — K219 Gastro-esophageal reflux disease without esophagitis: Secondary | ICD-10-CM | POA: Diagnosis not present

## 2022-05-28 DIAGNOSIS — H34812 Central retinal vein occlusion, left eye, with macular edema: Secondary | ICD-10-CM | POA: Diagnosis not present

## 2022-05-31 ENCOUNTER — Other Ambulatory Visit (HOSPITAL_COMMUNITY): Payer: Self-pay | Admitting: Psychiatry

## 2022-05-31 DIAGNOSIS — F3342 Major depressive disorder, recurrent, in full remission: Secondary | ICD-10-CM

## 2022-06-19 DIAGNOSIS — M47816 Spondylosis without myelopathy or radiculopathy, lumbar region: Secondary | ICD-10-CM | POA: Diagnosis not present

## 2022-06-19 DIAGNOSIS — M48061 Spinal stenosis, lumbar region without neurogenic claudication: Secondary | ICD-10-CM | POA: Diagnosis not present

## 2022-06-19 DIAGNOSIS — M545 Low back pain, unspecified: Secondary | ICD-10-CM | POA: Diagnosis not present

## 2022-07-09 DIAGNOSIS — H43812 Vitreous degeneration, left eye: Secondary | ICD-10-CM | POA: Diagnosis not present

## 2022-07-09 DIAGNOSIS — H34812 Central retinal vein occlusion, left eye, with macular edema: Secondary | ICD-10-CM | POA: Diagnosis not present

## 2022-07-09 DIAGNOSIS — Z8669 Personal history of other diseases of the nervous system and sense organs: Secondary | ICD-10-CM | POA: Diagnosis not present

## 2022-07-24 DIAGNOSIS — M48061 Spinal stenosis, lumbar region without neurogenic claudication: Secondary | ICD-10-CM | POA: Diagnosis not present

## 2022-07-24 DIAGNOSIS — M545 Low back pain, unspecified: Secondary | ICD-10-CM | POA: Diagnosis not present

## 2022-07-24 DIAGNOSIS — M47816 Spondylosis without myelopathy or radiculopathy, lumbar region: Secondary | ICD-10-CM | POA: Diagnosis not present

## 2022-07-25 DIAGNOSIS — D485 Neoplasm of uncertain behavior of skin: Secondary | ICD-10-CM | POA: Diagnosis not present

## 2022-07-25 DIAGNOSIS — C4402 Squamous cell carcinoma of skin of lip: Secondary | ICD-10-CM | POA: Diagnosis not present

## 2022-07-25 DIAGNOSIS — Z85828 Personal history of other malignant neoplasm of skin: Secondary | ICD-10-CM | POA: Diagnosis not present

## 2022-07-25 DIAGNOSIS — D225 Melanocytic nevi of trunk: Secondary | ICD-10-CM | POA: Diagnosis not present

## 2022-07-25 DIAGNOSIS — L821 Other seborrheic keratosis: Secondary | ICD-10-CM | POA: Diagnosis not present

## 2022-08-20 DIAGNOSIS — H34812 Central retinal vein occlusion, left eye, with macular edema: Secondary | ICD-10-CM | POA: Diagnosis not present

## 2022-10-01 DIAGNOSIS — H34812 Central retinal vein occlusion, left eye, with macular edema: Secondary | ICD-10-CM | POA: Diagnosis not present

## 2022-10-03 DIAGNOSIS — C4402 Squamous cell carcinoma of skin of lip: Secondary | ICD-10-CM | POA: Diagnosis not present

## 2022-10-03 DIAGNOSIS — L905 Scar conditions and fibrosis of skin: Secondary | ICD-10-CM | POA: Diagnosis not present

## 2022-10-24 DIAGNOSIS — E785 Hyperlipidemia, unspecified: Secondary | ICD-10-CM | POA: Diagnosis not present

## 2022-10-24 DIAGNOSIS — L03818 Cellulitis of other sites: Secondary | ICD-10-CM | POA: Diagnosis not present

## 2022-10-24 DIAGNOSIS — M7989 Other specified soft tissue disorders: Secondary | ICD-10-CM | POA: Diagnosis not present

## 2022-10-24 DIAGNOSIS — M79662 Pain in left lower leg: Secondary | ICD-10-CM | POA: Diagnosis not present

## 2022-10-24 DIAGNOSIS — I8002 Phlebitis and thrombophlebitis of superficial vessels of left lower extremity: Secondary | ICD-10-CM | POA: Diagnosis not present

## 2022-10-24 DIAGNOSIS — M79672 Pain in left foot: Secondary | ICD-10-CM | POA: Diagnosis not present

## 2022-10-24 DIAGNOSIS — F419 Anxiety disorder, unspecified: Secondary | ICD-10-CM | POA: Diagnosis not present

## 2022-10-24 DIAGNOSIS — F32A Depression, unspecified: Secondary | ICD-10-CM | POA: Diagnosis not present

## 2022-10-24 DIAGNOSIS — Z79890 Hormone replacement therapy (postmenopausal): Secondary | ICD-10-CM | POA: Diagnosis not present

## 2022-10-24 DIAGNOSIS — Z881 Allergy status to other antibiotic agents status: Secondary | ICD-10-CM | POA: Diagnosis not present

## 2022-10-29 DIAGNOSIS — N3000 Acute cystitis without hematuria: Secondary | ICD-10-CM | POA: Diagnosis not present

## 2022-10-29 DIAGNOSIS — K219 Gastro-esophageal reflux disease without esophagitis: Secondary | ICD-10-CM | POA: Diagnosis not present

## 2022-10-29 DIAGNOSIS — L0390 Cellulitis, unspecified: Secondary | ICD-10-CM | POA: Diagnosis not present

## 2022-10-29 DIAGNOSIS — K222 Esophageal obstruction: Secondary | ICD-10-CM | POA: Diagnosis not present

## 2022-10-29 DIAGNOSIS — R350 Frequency of micturition: Secondary | ICD-10-CM | POA: Diagnosis not present

## 2022-11-04 DIAGNOSIS — R7303 Prediabetes: Secondary | ICD-10-CM | POA: Diagnosis not present

## 2022-11-04 DIAGNOSIS — E785 Hyperlipidemia, unspecified: Secondary | ICD-10-CM | POA: Diagnosis not present

## 2022-11-04 DIAGNOSIS — E039 Hypothyroidism, unspecified: Secondary | ICD-10-CM | POA: Diagnosis not present

## 2022-11-04 DIAGNOSIS — Z0000 Encounter for general adult medical examination without abnormal findings: Secondary | ICD-10-CM | POA: Diagnosis not present

## 2022-11-04 DIAGNOSIS — F332 Major depressive disorder, recurrent severe without psychotic features: Secondary | ICD-10-CM | POA: Diagnosis not present

## 2022-11-12 DIAGNOSIS — H34812 Central retinal vein occlusion, left eye, with macular edema: Secondary | ICD-10-CM | POA: Diagnosis not present

## 2022-12-06 DIAGNOSIS — Z780 Asymptomatic menopausal state: Secondary | ICD-10-CM | POA: Diagnosis not present

## 2022-12-06 DIAGNOSIS — E785 Hyperlipidemia, unspecified: Secondary | ICD-10-CM | POA: Diagnosis not present

## 2022-12-06 DIAGNOSIS — N183 Chronic kidney disease, stage 3 unspecified: Secondary | ICD-10-CM | POA: Diagnosis not present

## 2022-12-06 DIAGNOSIS — E039 Hypothyroidism, unspecified: Secondary | ICD-10-CM | POA: Diagnosis not present

## 2022-12-24 DIAGNOSIS — Z8669 Personal history of other diseases of the nervous system and sense organs: Secondary | ICD-10-CM | POA: Diagnosis not present

## 2022-12-24 DIAGNOSIS — H34812 Central retinal vein occlusion, left eye, with macular edema: Secondary | ICD-10-CM | POA: Diagnosis not present

## 2022-12-24 DIAGNOSIS — H43812 Vitreous degeneration, left eye: Secondary | ICD-10-CM | POA: Diagnosis not present

## 2023-01-02 DIAGNOSIS — Z13820 Encounter for screening for osteoporosis: Secondary | ICD-10-CM | POA: Diagnosis not present

## 2023-01-02 DIAGNOSIS — Z780 Asymptomatic menopausal state: Secondary | ICD-10-CM | POA: Diagnosis not present

## 2023-02-04 DIAGNOSIS — H34812 Central retinal vein occlusion, left eye, with macular edema: Secondary | ICD-10-CM | POA: Diagnosis not present
